# Patient Record
Sex: Male | Born: 1950
Health system: Southern US, Community
[De-identification: ages and names within clinical notes are randomized; demographics above are authoritative.]

## PROBLEM LIST (undated history)

## (undated) ENCOUNTER — Emergency Department: Discharge status: Absent without leave | Payer: Self-pay

## (undated) DIAGNOSIS — I351 Nonrheumatic aortic (valve) insufficiency: Secondary | ICD-10-CM

## (undated) DIAGNOSIS — J45909 Unspecified asthma, uncomplicated: Secondary | ICD-10-CM

## (undated) DIAGNOSIS — C4490 Unspecified malignant neoplasm of skin, unspecified: Secondary | ICD-10-CM

## (undated) DIAGNOSIS — I340 Nonrheumatic mitral (valve) insufficiency: Secondary | ICD-10-CM

## (undated) DIAGNOSIS — H5350 Unspecified color vision deficiencies: Secondary | ICD-10-CM

## (undated) DIAGNOSIS — M7710 Lateral epicondylitis, unspecified elbow: Secondary | ICD-10-CM

## (undated) DIAGNOSIS — H04123 Dry eye syndrome of bilateral lacrimal glands: Secondary | ICD-10-CM

## (undated) DIAGNOSIS — H269 Unspecified cataract: Secondary | ICD-10-CM

## (undated) DIAGNOSIS — M1990 Unspecified osteoarthritis, unspecified site: Secondary | ICD-10-CM

## (undated) DIAGNOSIS — H9319 Tinnitus, unspecified ear: Secondary | ICD-10-CM

## (undated) DIAGNOSIS — M810 Age-related osteoporosis without current pathological fracture: Secondary | ICD-10-CM

## (undated) DIAGNOSIS — K219 Gastro-esophageal reflux disease without esophagitis: Secondary | ICD-10-CM

## (undated) DIAGNOSIS — K649 Unspecified hemorrhoids: Secondary | ICD-10-CM

## (undated) DIAGNOSIS — G901 Familial dysautonomia [Riley-Day]: Secondary | ICD-10-CM

## (undated) DIAGNOSIS — E039 Hypothyroidism, unspecified: Secondary | ICD-10-CM

## (undated) DIAGNOSIS — M797 Fibromyalgia: Secondary | ICD-10-CM

## (undated) DIAGNOSIS — N429 Disorder of prostate, unspecified: Secondary | ICD-10-CM

## (undated) DIAGNOSIS — I10 Essential (primary) hypertension: Secondary | ICD-10-CM

## (undated) DIAGNOSIS — R6889 Other general symptoms and signs: Secondary | ICD-10-CM

## (undated) DIAGNOSIS — K589 Irritable bowel syndrome without diarrhea: Secondary | ICD-10-CM

## (undated) DIAGNOSIS — G4730 Sleep apnea, unspecified: Secondary | ICD-10-CM

## (undated) DIAGNOSIS — E785 Hyperlipidemia, unspecified: Secondary | ICD-10-CM

## (undated) DIAGNOSIS — N200 Calculus of kidney: Secondary | ICD-10-CM

## (undated) HISTORY — DX: Irritable bowel syndrome without diarrhea: K58.9

## (undated) HISTORY — DX: Tinnitus, unspecified ear: H93.19

## (undated) HISTORY — DX: Other general symptoms and signs: R68.89

## (undated) HISTORY — DX: Dry eye syndrome of bilateral lacrimal glands: H04.123

## (undated) HISTORY — DX: Hypothyroidism, unspecified: E03.9

## (undated) HISTORY — DX: Familial dysautonomia (riley-day): G90.1

## (undated) HISTORY — DX: Unspecified osteoarthritis, unspecified site: M19.90

## (undated) HISTORY — PX: TONSILLECTOMY AND ADENOIDECTOMY: SUR1326

## (undated) HISTORY — DX: Unspecified asthma, uncomplicated: J45.909

## (undated) HISTORY — DX: Age-related osteoporosis without current pathological fracture: M81.0

## (undated) HISTORY — DX: Lateral epicondylitis, unspecified elbow: M77.10

## (undated) HISTORY — DX: Unspecified color vision deficiencies: H53.50

## (undated) HISTORY — DX: Unspecified hemorrhoids: K64.9

## (undated) HISTORY — DX: Hyperlipidemia, unspecified: E78.5

## (undated) HISTORY — DX: Essential (primary) hypertension: I10

## (undated) HISTORY — PX: ABLATION OF DYSRHYTHMIC FOCUS: SHX254

## (undated) HISTORY — DX: Nonrheumatic aortic (valve) insufficiency: I35.1

---

## 1978-08-22 HISTORY — PX: ORCHIOPEXY: SUR915

## 2001-08-22 DIAGNOSIS — D496 Neoplasm of unspecified behavior of brain: Secondary | ICD-10-CM

## 2001-08-22 HISTORY — DX: Neoplasm of unspecified behavior of brain: D49.6

## 2002-02-02 ENCOUNTER — Ambulatory Visit
Admit: 2002-02-02 | Disposition: A | Discharge status: Home / Self Care | Payer: Self-pay | Source: Ambulatory Visit | Admitting: Critical Care Medicine

## 2002-08-22 DIAGNOSIS — Z5189 Encounter for other specified aftercare: Secondary | ICD-10-CM

## 2002-08-22 HISTORY — DX: Encounter for other specified aftercare: Z51.89

## 2002-11-16 ENCOUNTER — Ambulatory Visit
Admit: 2002-11-16 | Disposition: A | Discharge status: Home / Self Care | Payer: Self-pay | Source: Ambulatory Visit | Admitting: Family Medicine

## 2002-12-05 HISTORY — PX: OTHER SURGICAL HISTORY: SHX169

## 2003-01-10 ENCOUNTER — Ambulatory Visit
Admission: RE | Admit: 2003-01-10 | Disposition: A | Discharge status: Home / Self Care | Payer: Self-pay | Source: Ambulatory Visit | Admitting: Urology

## 2003-06-13 ENCOUNTER — Ambulatory Visit
Admit: 2003-06-13 | Disposition: A | Discharge status: Home / Self Care | Payer: Self-pay | Source: Ambulatory Visit | Admitting: Orthopaedic Surgery

## 2003-06-18 ENCOUNTER — Ambulatory Visit: Admission: RE | Admit: 2003-06-18 | Payer: Self-pay | Source: Ambulatory Visit | Admitting: Urology

## 2004-02-06 ENCOUNTER — Ambulatory Visit: Admission: RE | Admit: 2004-02-06 | Payer: Self-pay | Source: Ambulatory Visit | Admitting: Urology

## 2004-06-04 ENCOUNTER — Ambulatory Visit
Admit: 2004-06-04 | Disposition: A | Discharge status: Home / Self Care | Payer: Self-pay | Source: Ambulatory Visit | Admitting: Orthopaedic Surgery

## 2004-06-11 ENCOUNTER — Ambulatory Visit
Admission: RE | Admit: 2004-06-11 | Disposition: A | Discharge status: Home / Self Care | Payer: Self-pay | Source: Ambulatory Visit | Admitting: Orthopaedic Surgery

## 2005-03-03 ENCOUNTER — Ambulatory Visit
Admit: 2005-03-03 | Disposition: A | Discharge status: Home / Self Care | Payer: Self-pay | Source: Ambulatory Visit | Admitting: Urology

## 2005-03-11 ENCOUNTER — Ambulatory Visit: Admission: RE | Admit: 2005-03-11 | Payer: Self-pay | Source: Ambulatory Visit | Admitting: Urology

## 2005-04-04 ENCOUNTER — Ambulatory Visit
Admit: 2005-04-04 | Disposition: A | Discharge status: Home / Self Care | Payer: Self-pay | Source: Ambulatory Visit | Admitting: Family Medicine

## 2005-04-16 ENCOUNTER — Ambulatory Visit
Admit: 2005-04-16 | Disposition: A | Discharge status: Home / Self Care | Payer: Self-pay | Source: Ambulatory Visit | Admitting: Family Medicine

## 2005-04-29 HISTORY — PX: ECHOCARDIOGRAM, TRANSTHORACIC: SHX3784

## 2005-06-16 ENCOUNTER — Ambulatory Visit
Admit: 2005-06-16 | Disposition: A | Discharge status: Home / Self Care | Payer: Self-pay | Source: Ambulatory Visit | Admitting: Internal Medicine

## 2005-06-21 ENCOUNTER — Ambulatory Visit
Admission: RE | Admit: 2005-06-21 | Disposition: A | Discharge status: Home / Self Care | Payer: Self-pay | Source: Ambulatory Visit | Admitting: Internal Medicine

## 2005-06-22 ENCOUNTER — Ambulatory Visit
Admit: 2005-06-22 | Disposition: A | Discharge status: Home / Self Care | Payer: Self-pay | Source: Ambulatory Visit | Admitting: Neurology

## 2005-06-26 ENCOUNTER — Emergency Department: Admit: 2005-06-26 | Payer: Self-pay | Source: Emergency Department | Admitting: Emergency Medicine

## 2005-07-01 ENCOUNTER — Ambulatory Visit
Admit: 2005-07-01 | Disposition: A | Discharge status: Home / Self Care | Payer: Self-pay | Source: Ambulatory Visit | Admitting: Internal Medicine

## 2005-07-08 ENCOUNTER — Ambulatory Visit
Admit: 2005-07-08 | Disposition: A | Discharge status: Home / Self Care | Payer: Self-pay | Source: Ambulatory Visit | Admitting: Hospice and Palliative Medicine

## 2006-08-22 DIAGNOSIS — M8700 Idiopathic aseptic necrosis of unspecified bone: Secondary | ICD-10-CM

## 2006-08-22 HISTORY — PX: COLONOSCOPY: SHX174

## 2006-08-22 HISTORY — DX: Idiopathic aseptic necrosis of unspecified bone: M87.00

## 2006-08-22 IMAGING — CT CT NECK W/ CM
1 of 2 series · 9 of 14 positions shown, 12 images · IV contrast (75ML OMNI 300)
Comparison: None.

CLINICAL DATA: Chronic throat pain with left neck discomfort. 
NECK CT WITH CONTRAST:
TECHNIQUE: Multidetector CT imaging of the neck was performed following the standard protocol during administration of intravenous contrast.  A BB was placed over the patient?s palpable concern in the left infrahyoid region.
Contrast:  75 cc Omnipaque 300

[Series 2: neck w/ · axial · 0.39mm/px · z∈[-253,-30]mm · 9 of 113 slices shown, 12 images]
[im 12/113  soft-tissue]
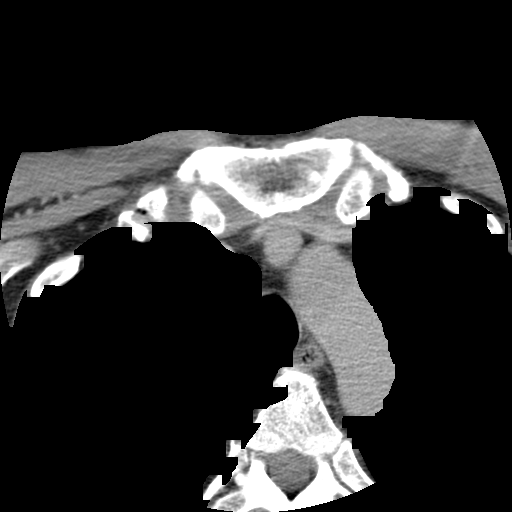
[im 12/113  bone]
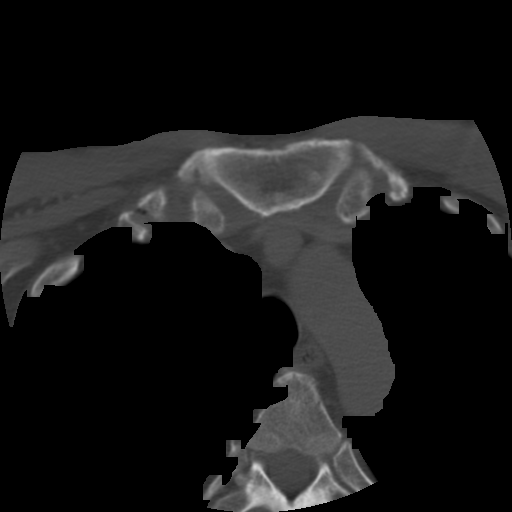
[im 23/113  bone]
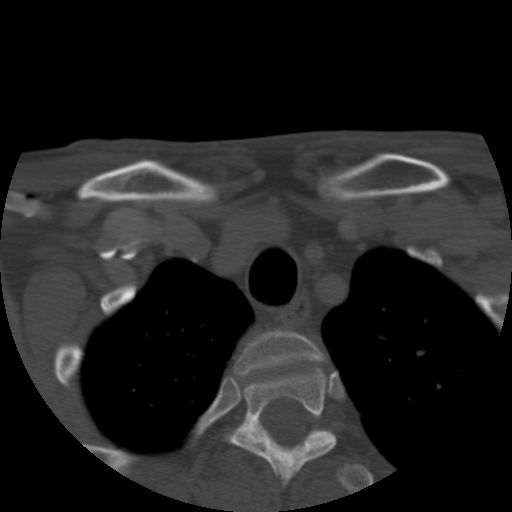
[im 34/113  bone]
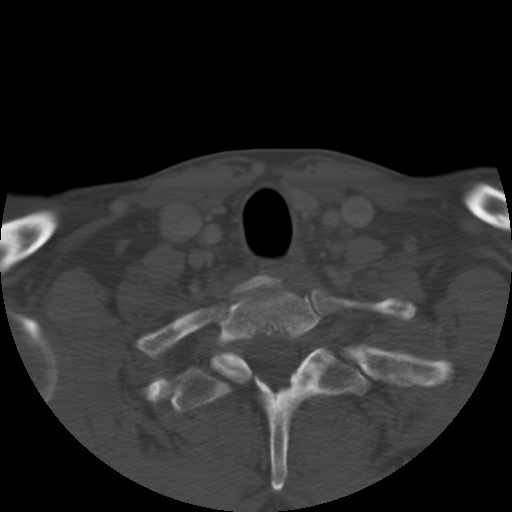
[im 45/113  bone]
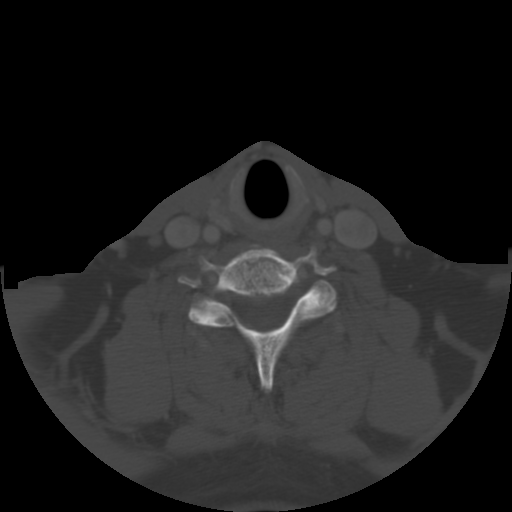
[im 57/113  soft-tissue]
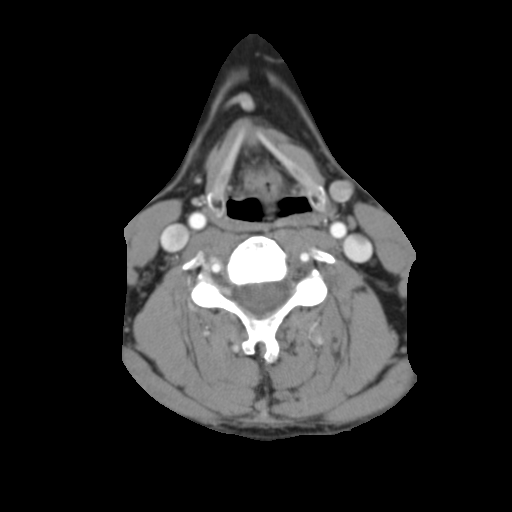
[im 57/113  bone]
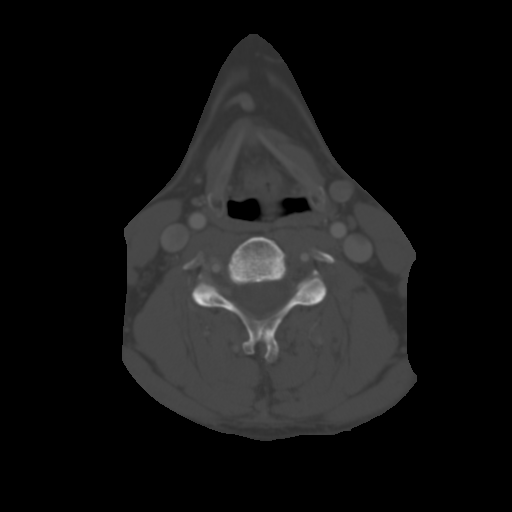
[im 68/113  bone]
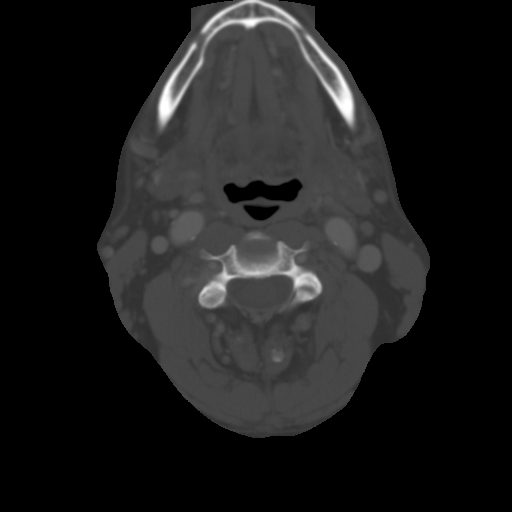
[im 79/113  bone]
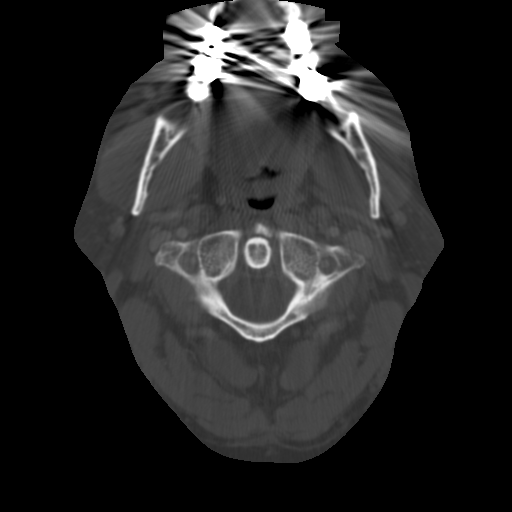
[im 90/113  bone]
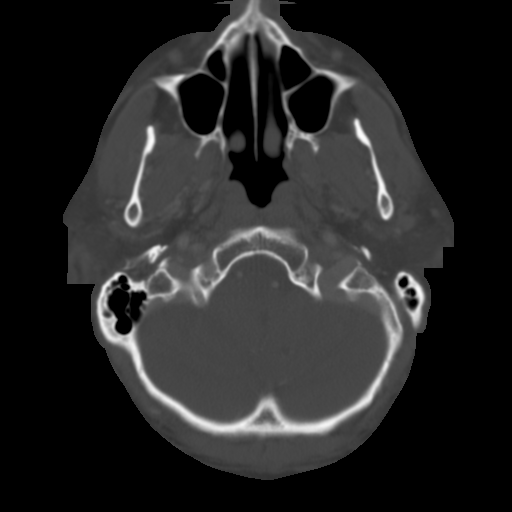
[im 101/113  soft-tissue]
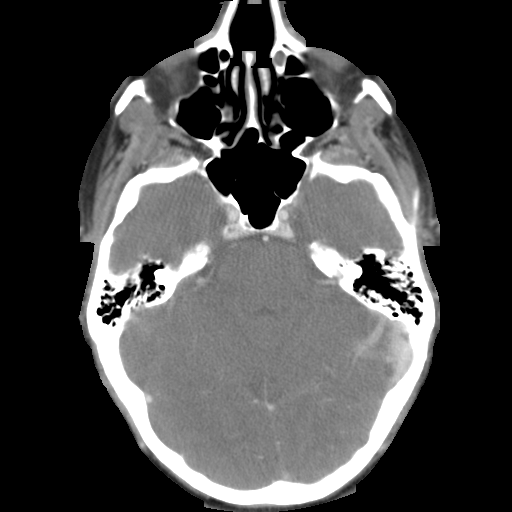
[im 101/113  bone]
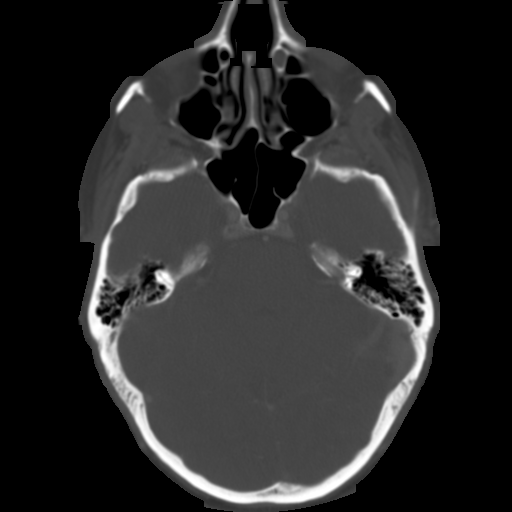

[9 of 14 positions shown; findings below may reference images not displayed]

FINDINGS: No lesions of the pharyngeal mucosal space are identified.  There are no enlarged cervical lymph nodes.  A BB was placed over the patient?s palpable concern in the left infrahyoid region.  No underlying mass is demonstrated in this region. The salivary glands appear unremarkable.  The thyroid gland is noted to be relatively small and slightly nodular in contour without focal abnormality.  The lung apices are clear.  Visualized paranasal sinuses are clear.  Postoperative changes are noted in the right occipital skull with underlying encephalomalacia reportedly due to a history of prior meningioma resection.
IMPRESSION: CT of the neck demonstrates no mass or adenopathy.  Right parietooccipital postoperative changes are partially imaged.

## 2006-11-03 ENCOUNTER — Ambulatory Visit
Admit: 2006-11-03 | Disposition: A | Discharge status: Home / Self Care | Payer: Self-pay | Source: Ambulatory Visit | Admitting: Orthopaedic Surgery

## 2007-01-12 ENCOUNTER — Ambulatory Visit: Admit: 2007-01-12 | Disposition: A | Discharge status: Home / Self Care | Payer: Self-pay | Source: Ambulatory Visit

## 2007-01-12 LAB — GGT: GGT: 125 U/L — ABNORMAL HIGH (ref 15–44)

## 2007-01-12 LAB — URINALYSIS
Bilirubin, UA: NEGATIVE
Blood, UA: NEGATIVE
Glucose, UA: NEGATIVE
Ketones UA: NEGATIVE
Leukocyte Esterase, UA: NEGATIVE
Nitrite, UA: NEGATIVE
Protein, UR: NEGATIVE
Specific Gravity UA POCT: 1.021 (ref 1.001–1.035)
Urine pH: 5 (ref 5.0–8.0)
Urobilinogen, UA: 0.2 EU/dL (ref 0.2–1.0)

## 2007-01-12 LAB — CBC WITH AUTO DIFFERENTIAL CERNER
Basophils Absolute: 0 /mm3 (ref 0.0–0.2)
Basophils: 1 % (ref 0–2)
Eosinophils Absolute: 0.2 /mm3 (ref 0.0–0.7)
Eosinophils: 3 % (ref 0–5)
Granulocytes Absolute: 2.8 /mm3 (ref 1.8–8.1)
Hematocrit: 38 % — ABNORMAL LOW (ref 42.0–52.0)
Hgb: 12.9 G/DL — ABNORMAL LOW (ref 13.0–17.0)
Lymphocytes Absolute: 2.2 /mm3 (ref 0.5–4.4)
Lymphocytes: 39 % (ref 15–41)
MCH: 29.9 PG (ref 28.0–32.0)
MCHC: 33.9 G/DL (ref 32.0–36.0)
MCV: 88.1 FL (ref 80.0–100.0)
MPV: 10 FL (ref 7.4–10.4)
Monocytes Absolute: 0.6 /mm3 (ref 0.0–1.2)
Monocytes: 10 % (ref 0–11)
Neutrophils %: 48 % — ABNORMAL LOW (ref 52–75)
Platelets: 230 /mm3 (ref 140–400)
RBC: 4.32 /mm3 — ABNORMAL LOW (ref 4.70–6.00)
RDW: 13.5 % (ref 11.5–15.0)
WBC: 5.8 /mm3 (ref 3.5–10.8)

## 2007-01-12 LAB — COMPREHENSIVE METABOLIC PANEL
ALT: 52 U/L — ABNORMAL HIGH (ref 3–36)
AST (SGOT): 48 U/L — ABNORMAL HIGH (ref 10–41)
Albumin/Globulin Ratio: 1.3 (ref 1.1–1.8)
Albumin: 4.3 g/dL (ref 3.4–4.9)
Alkaline Phosphatase: 54 U/L (ref 43–112)
BUN: 26 mg/dL — ABNORMAL HIGH (ref 8–20)
Bilirubin, Total: 0.3 mg/dL (ref 0.1–1.0)
CO2: 31 mEq/L — ABNORMAL HIGH (ref 21–30)
Calcium: 9.5 mg/dL (ref 8.6–10.2)
Chloride: 102 mEq/L (ref 98–107)
Creatinine: 1.3 mg/dL (ref 0.6–1.5)
Globulin: 3.4 g/dL (ref 2.0–3.7)
Glucose: 108 mg/dL — ABNORMAL HIGH (ref 70–100)
Potassium: 4.2 mEq/L (ref 3.6–5.0)
Protein, Total: 7.7 g/dL (ref 6.0–8.0)
Sodium: 140 mEq/L (ref 136–146)

## 2007-01-12 LAB — LIPID PANEL
Cholesterol: 192 mg/dL (ref 50–200)
HDL: 55 mg/dL (ref 29–67)
LDL Calculated: 107 mg/dL (ref 0–130)
Triglycerides: 152 mg/dL (ref 40–160)
VLDL Calculated: 30 mg/dL (ref 10–40)

## 2007-01-12 LAB — PHOSPHORUS: Phosphorus: 2.7 mg/dL (ref 2.5–4.5)

## 2007-01-12 LAB — LACTATE DEHYDROGENASE: LDH: 478 U/L (ref 307–575)

## 2007-01-12 LAB — C-REACTIVE PROTEIN HIGH SENSITIVE: C-Reactive Protein, High Sensitive: 0.025 mg/dL (ref ?–0.300)

## 2007-01-12 LAB — TSH: TSH: 0.718 u[IU]/mL (ref 0.465–4.680)

## 2007-01-12 LAB — PSA: Prostate Specific Antigen, Total: 0.1 ng/mL — AB (ref 0.0–4.0)

## 2007-01-12 LAB — GFR

## 2007-01-12 LAB — URIC ACID: Uric acid: 4.3 mg/dL (ref 4.0–8.8)

## 2008-12-03 HISTORY — PX: EXCISION, LESION: SHX3982

## 2009-10-26 ENCOUNTER — Ambulatory Visit
Admit: 2009-10-26 | Disposition: A | Discharge status: Home / Self Care | Payer: Self-pay | Source: Ambulatory Visit | Admitting: Internal Medicine

## 2011-01-05 ENCOUNTER — Other Ambulatory Visit: Payer: Self-pay | Admitting: Internal Medicine

## 2011-01-05 ENCOUNTER — Other Ambulatory Visit: Payer: Self-pay

## 2011-01-05 DIAGNOSIS — M797 Fibromyalgia: Secondary | ICD-10-CM

## 2011-01-05 DIAGNOSIS — R5382 Chronic fatigue, unspecified: Secondary | ICD-10-CM

## 2011-01-05 MED ORDER — CLONAZEPAM 0.5 MG PO TABS
0.50 mg | ORAL_TABLET | Freq: Three times a day (TID) | ORAL | Status: DC | PRN
Start: 2011-01-05 — End: 2012-01-30

## 2011-01-05 MED ORDER — MODAFINIL 100 MG PO TABS
100.00 mg | ORAL_TABLET | Freq: Three times a day (TID) | ORAL | Status: DC
Start: 2011-01-05 — End: 2011-07-19

## 2011-01-18 ENCOUNTER — Other Ambulatory Visit: Payer: Self-pay | Admitting: Internal Medicine

## 2011-01-18 DIAGNOSIS — M797 Fibromyalgia: Secondary | ICD-10-CM

## 2011-01-18 MED ORDER — PREGABALIN 100 MG PO CAPS
100.00 mg | ORAL_CAPSULE | Freq: Three times a day (TID) | ORAL | Status: DC
Start: 2011-01-18 — End: 2011-08-17

## 2011-03-02 ENCOUNTER — Other Ambulatory Visit: Payer: Self-pay | Admitting: Internal Medicine

## 2011-03-02 DIAGNOSIS — M797 Fibromyalgia: Secondary | ICD-10-CM

## 2011-03-02 MED ORDER — OXYCODONE-ACETAMINOPHEN 5-325 MG PO TABS
1.00 | ORAL_TABLET | Freq: Two times a day (BID) | ORAL | Status: DC
Start: 2011-03-02 — End: 2011-06-14

## 2011-03-02 NOTE — Progress Notes (Signed)
Update medications-altace is 20mg  daily

## 2011-04-08 NOTE — Progress Notes (Signed)
Med list updated

## 2011-04-27 ENCOUNTER — Other Ambulatory Visit: Payer: Self-pay

## 2011-04-27 DIAGNOSIS — E291 Testicular hypofunction: Secondary | ICD-10-CM

## 2011-04-27 DIAGNOSIS — M797 Fibromyalgia: Secondary | ICD-10-CM

## 2011-04-27 DIAGNOSIS — F32A Depression, unspecified: Secondary | ICD-10-CM

## 2011-04-27 MED ORDER — DULOXETINE HCL 60 MG PO CPEP
60.00 mg | ORAL_CAPSULE | Freq: Every day | ORAL | Status: AC
Start: 2011-04-27 — End: 2012-04-26

## 2011-04-27 MED ORDER — TESTOSTERONE 1.25 GM/ACT (1%) TD GEL
5.00 g | Freq: Every day | TRANSDERMAL | Status: DC
Start: 2011-04-27 — End: 2011-07-19

## 2011-04-27 NOTE — Telephone Encounter (Signed)
Patient requested refills on these meds per message below:    Could you please have [name] send scripts for:    Cymbalta 60 mg/day # 90 with three refills     Androgel cream 5 gm/day # 90 with one refill (only one allowed).

## 2011-05-07 LAB — ECG 12-LEAD
Atrial Rate: 69 {beats}/min
P Axis: 45 degrees
P-R Interval: 166 ms
Q-T Interval: 412 ms
QRS Duration: 116 ms
QTC Calculation (Bezet): 441 ms
R Axis: -10 degrees
T Axis: 30 degrees
Ventricular Rate: 69 {beats}/min

## 2011-06-14 ENCOUNTER — Other Ambulatory Visit: Payer: Self-pay | Admitting: Internal Medicine

## 2011-06-14 DIAGNOSIS — M797 Fibromyalgia: Secondary | ICD-10-CM

## 2011-06-14 MED ORDER — OXYCODONE-ACETAMINOPHEN 5-325 MG PO TABS
1.00 | ORAL_TABLET | Freq: Two times a day (BID) | ORAL | Status: DC
Start: 2011-06-23 — End: 2011-06-14

## 2011-06-14 MED ORDER — OXYCODONE-ACETAMINOPHEN 5-325 MG PO TABS
1.00 | ORAL_TABLET | Freq: Two times a day (BID) | ORAL | Status: DC
Start: 2011-07-23 — End: 2011-06-14

## 2011-06-14 MED ORDER — OXYCODONE-ACETAMINOPHEN 5-325 MG PO TABS
1.00 | ORAL_TABLET | Freq: Two times a day (BID) | ORAL | Status: AC
Start: 2011-06-14 — End: 2011-07-13

## 2011-07-18 ENCOUNTER — Other Ambulatory Visit: Payer: Self-pay | Admitting: Internal Medicine

## 2011-07-19 ENCOUNTER — Other Ambulatory Visit: Payer: Self-pay | Admitting: Internal Medicine

## 2011-07-19 DIAGNOSIS — E291 Testicular hypofunction: Secondary | ICD-10-CM

## 2011-07-19 DIAGNOSIS — J310 Chronic rhinitis: Secondary | ICD-10-CM

## 2011-07-19 DIAGNOSIS — R5383 Other fatigue: Secondary | ICD-10-CM

## 2011-07-19 DIAGNOSIS — E781 Pure hyperglyceridemia: Secondary | ICD-10-CM

## 2011-07-19 DIAGNOSIS — I10 Essential (primary) hypertension: Secondary | ICD-10-CM

## 2011-07-19 DIAGNOSIS — M797 Fibromyalgia: Secondary | ICD-10-CM

## 2011-07-19 DIAGNOSIS — E785 Hyperlipidemia, unspecified: Secondary | ICD-10-CM

## 2011-07-19 DIAGNOSIS — J45998 Other asthma: Secondary | ICD-10-CM

## 2011-07-19 DIAGNOSIS — N400 Enlarged prostate without lower urinary tract symptoms: Secondary | ICD-10-CM

## 2011-07-19 DIAGNOSIS — E039 Hypothyroidism, unspecified: Secondary | ICD-10-CM

## 2011-07-19 DIAGNOSIS — F32A Depression, unspecified: Secondary | ICD-10-CM

## 2011-07-19 MED ORDER — LEVOTHYROXINE SODIUM 200 MCG PO TABS
200.00 ug | ORAL_TABLET | Freq: Every day | ORAL | Status: DC
Start: 2011-07-19 — End: 2012-05-14

## 2011-07-19 MED ORDER — ALBUTEROL 90 MCG/ACT IN AERS
2.0000 | INHALATION_SPRAY | Freq: Four times a day (QID) | RESPIRATORY_TRACT | Status: AC | PRN
Start: 2011-07-19 — End: 2012-07-13

## 2011-07-19 MED ORDER — EZETIMIBE 10 MG PO TABS
10.00 mg | ORAL_TABLET | Freq: Every day | ORAL | Status: DC
Start: 2011-07-19 — End: 2012-05-14

## 2011-07-19 MED ORDER — DUTASTERIDE 0.5 MG PO CAPS
0.50 mg | ORAL_CAPSULE | Freq: Every day | ORAL | Status: DC
Start: 2011-07-19 — End: 2012-05-14

## 2011-07-19 MED ORDER — AMLODIPINE BESYLATE 5 MG PO TABS
5.00 mg | ORAL_TABLET | Freq: Every day | ORAL | Status: DC
Start: 2011-07-19 — End: 2012-05-14

## 2011-07-19 MED ORDER — MODAFINIL 100 MG PO TABS
100.00 mg | ORAL_TABLET | Freq: Three times a day (TID) | ORAL | Status: DC
Start: 2011-07-19 — End: 2012-01-30

## 2011-07-19 MED ORDER — TRIAMCINOLONE ACETONIDE 55 MCG/ACT NA INHA
2.00 | Freq: Two times a day (BID) | NASAL | Status: DC
Start: 2011-07-19 — End: 2012-05-14

## 2011-07-19 MED ORDER — TESTOSTERONE 1.25 GM/ACT (1%) TD GEL
5.00 g | Freq: Every day | TRANSDERMAL | Status: DC
Start: 2011-07-19 — End: 2012-01-30

## 2011-07-19 MED ORDER — RAMIPRIL 10 MG PO TABS
20.00 mg | ORAL_TABLET | Freq: Every day | ORAL | Status: DC
Start: 2011-07-19 — End: 2014-02-13

## 2011-07-19 MED ORDER — MONTELUKAST SODIUM 10 MG PO TABS
10.00 mg | ORAL_TABLET | Freq: Every evening | ORAL | Status: DC
Start: 2011-07-19 — End: 2012-05-14

## 2011-07-19 MED ORDER — FENOFIBRATE 145 MG PO TABS
145.00 mg | ORAL_TABLET | Freq: Every day | ORAL | Status: DC
Start: 2011-07-19 — End: 2012-05-14

## 2011-07-19 NOTE — Telephone Encounter (Signed)
Meds refilled for one year. Patient aware of need to recheck testosterone level.

## 2011-08-08 ENCOUNTER — Encounter: Payer: Self-pay | Admitting: Internal Medicine

## 2011-08-17 ENCOUNTER — Other Ambulatory Visit: Payer: Self-pay | Admitting: Internal Medicine

## 2011-08-17 DIAGNOSIS — M797 Fibromyalgia: Secondary | ICD-10-CM

## 2011-08-17 MED ORDER — PREGABALIN 100 MG PO CAPS
100.00 mg | ORAL_CAPSULE | Freq: Three times a day (TID) | ORAL | Status: DC
Start: 2011-08-17 — End: 2012-03-22

## 2011-08-31 ENCOUNTER — Other Ambulatory Visit: Payer: Self-pay | Admitting: Internal Medicine

## 2011-08-31 DIAGNOSIS — M797 Fibromyalgia: Secondary | ICD-10-CM

## 2011-08-31 MED ORDER — OXYCODONE-ACETAMINOPHEN 5-325 MG PO TABS
1.00 | ORAL_TABLET | Freq: Two times a day (BID) | ORAL | Status: DC
Start: 2011-10-01 — End: 2011-10-25

## 2011-08-31 MED ORDER — OXYCODONE-ACETAMINOPHEN 5-325 MG PO TABS
1.00 | ORAL_TABLET | Freq: Two times a day (BID) | ORAL | Status: DC
Start: 2011-11-01 — End: 2011-10-25

## 2011-08-31 MED ORDER — OXYCODONE-ACETAMINOPHEN 5-325 MG PO TABS
1.00 | ORAL_TABLET | Freq: Two times a day (BID) | ORAL | Status: DC
Start: 2011-08-31 — End: 2011-08-31

## 2011-10-25 ENCOUNTER — Other Ambulatory Visit: Payer: Self-pay | Admitting: Internal Medicine

## 2011-10-25 DIAGNOSIS — M797 Fibromyalgia: Secondary | ICD-10-CM

## 2011-10-25 MED ORDER — OXYCODONE-ACETAMINOPHEN 5-325 MG PO TABS
1.00 | ORAL_TABLET | Freq: Two times a day (BID) | ORAL | Status: DC
Start: 2012-01-01 — End: 2011-10-25

## 2011-10-25 MED ORDER — OXYCODONE-ACETAMINOPHEN 5-325 MG PO TABS
1.00 | ORAL_TABLET | Freq: Two times a day (BID) | ORAL | Status: DC
Start: 2011-12-02 — End: 2011-10-25

## 2011-10-25 MED ORDER — OXYCODONE-ACETAMINOPHEN 5-325 MG PO TABS
1.00 | ORAL_TABLET | Freq: Two times a day (BID) | ORAL | Status: DC
Start: 2012-02-01 — End: 2012-01-30

## 2011-10-25 MED ORDER — OXYCODONE-ACETAMINOPHEN 5-325 MG PO TABS
1.00 | ORAL_TABLET | Freq: Two times a day (BID) | ORAL | Status: AC
Start: 2011-10-25 — End: 2011-11-24

## 2011-10-27 ENCOUNTER — Other Ambulatory Visit: Payer: Self-pay | Admitting: Internal Medicine

## 2011-10-27 DIAGNOSIS — M797 Fibromyalgia: Secondary | ICD-10-CM

## 2011-10-27 MED ORDER — CLONAZEPAM 0.5 MG PO TABS
ORAL_TABLET | ORAL | Status: DC
Start: 2011-10-27 — End: 2012-01-30

## 2012-01-30 ENCOUNTER — Other Ambulatory Visit: Payer: Self-pay | Admitting: Internal Medicine

## 2012-01-30 ENCOUNTER — Telehealth: Payer: Self-pay

## 2012-01-30 DIAGNOSIS — R5383 Other fatigue: Secondary | ICD-10-CM

## 2012-01-30 DIAGNOSIS — M797 Fibromyalgia: Secondary | ICD-10-CM

## 2012-01-30 DIAGNOSIS — E291 Testicular hypofunction: Secondary | ICD-10-CM

## 2012-01-30 MED ORDER — OXYCODONE-ACETAMINOPHEN 5-325 MG PO TABS
1.00 | ORAL_TABLET | Freq: Two times a day (BID) | ORAL | Status: DC
Start: 2012-04-03 — End: 2012-01-30

## 2012-01-30 MED ORDER — TESTOSTERONE 50 MG/5GM (1%) TD GEL
50.00 mg | Freq: Every day | TRANSDERMAL | Status: DC
Start: 2012-01-30 — End: 2013-08-20

## 2012-01-30 MED ORDER — MODAFINIL 100 MG PO TABS
100.00 mg | ORAL_TABLET | Freq: Three times a day (TID) | ORAL | Status: DC
Start: 2012-01-30 — End: 2012-08-06

## 2012-01-30 MED ORDER — OXYCODONE-ACETAMINOPHEN 5-325 MG PO TABS
1.00 | ORAL_TABLET | Freq: Two times a day (BID) | ORAL | Status: DC
Start: 2012-03-03 — End: 2012-01-30

## 2012-01-30 MED ORDER — OXYCODONE-ACETAMINOPHEN 5-325 MG PO TABS
1.00 | ORAL_TABLET | Freq: Two times a day (BID) | ORAL | Status: DC
Start: 2012-05-04 — End: 2012-05-31

## 2012-01-30 MED ORDER — TESTOSTERONE 50 MG/5GM (1%) TD GEL
50.00 mg | Freq: Every day | TRANSDERMAL | Status: DC
Start: 2012-01-30 — End: 2012-01-30

## 2012-01-30 MED ORDER — CLONAZEPAM 0.5 MG PO TABS
0.50 mg | ORAL_TABLET | Freq: Three times a day (TID) | ORAL | Status: DC | PRN
Start: 2012-01-30 — End: 2012-05-31

## 2012-01-30 NOTE — Telephone Encounter (Signed)
Just completed all them and we will put in the mail to you.  [name]    From: [name] [[email or web address]]   Sent: Tuesday, January 24, 2012 10:50 AM  To: [name].  Subject: scripts    Hi [name],  I am filling my last my last Percocet script.  I need new Percocet scripts: # 60 for each month.  You usually send 3 months at a time.   In addition, I need Clonazepan 0.5 mg # 90, one tab Q 8h.  It was last filled 06/20/11.  I also need Provigil 100 mg tabs, three po qd, # 3 months with one refill (it can only be refilled once)  Also need Androgel cream 5 gm qd with one refill (this also can only be refilled once).  I am not using the whole packet.  Your last script was Androgel pump.  I am using 2 pump depressions which is 2.5 gm daily since my last testosterone level was too high.  Have not redone blood levels.    I am going to get blood work and make my annual appointment.      Hope all is going well with you and your family.  Hope Verne Carrow is treating you fairly,    Kavaughn

## 2012-01-30 NOTE — Progress Notes (Signed)
Patients meds refilled-stable chronic meds. Patient scheduled for labs and follow up in next 3 months. Patient aware.

## 2012-03-22 ENCOUNTER — Other Ambulatory Visit: Payer: Self-pay | Admitting: Family Medicine

## 2012-03-22 DIAGNOSIS — M797 Fibromyalgia: Secondary | ICD-10-CM

## 2012-03-22 DIAGNOSIS — R5383 Other fatigue: Secondary | ICD-10-CM

## 2012-03-22 MED ORDER — PREGABALIN 100 MG PO CAPS
100.00 mg | ORAL_CAPSULE | Freq: Three times a day (TID) | ORAL | Status: DC
Start: 2012-03-22 — End: 2013-01-22

## 2012-03-22 MED ORDER — PREGABALIN 100 MG PO CAPS
100.00 mg | ORAL_CAPSULE | Freq: Three times a day (TID) | ORAL | Status: DC
Start: 2012-03-22 — End: 2012-03-22

## 2012-04-23 ENCOUNTER — Other Ambulatory Visit: Payer: Self-pay | Admitting: Internal Medicine

## 2012-04-24 ENCOUNTER — Telehealth: Payer: Self-pay | Admitting: Internal Medicine

## 2012-04-24 NOTE — Telephone Encounter (Signed)
Spoke with patient for 45 minutes regarding his complete medical history including meds and past medical history. Patient reports worsening of his post infectious (EBV) fibromyalgia muscle pains and chronic fatigue. Patient initially reported a 9 pound weight loss, but had not weighed himself in some time and so may have been gradual over several months. Patient has since regained 2 to 3 pounds. No new sxs of other local pains or sweats. No respiratory, cardiac, GI, or GU sxs. No new neurologic sxs. Of note patient had weaned off his clonazepam. At the time of this note patient has emailed to say restarting the clonazepam has made a world of improvement.     Longer term evaluation and treatment plans for his fibromyalgia and chronic fatigue were also discussed to include:  1. Resuming and increasing clonazepam if needed.  2. Trial of a fentanyl or lidocaine pain patch instead of periodic percocet-patient appears to have some pain relief over not using or exercise alone.  3. Referral to Dr. Hollie Salk for accupuncture and alternative therapy discussion. Also possible integrative labs discussion.  4. Suggested trial of gluten free diet to see if decreases patients "inflammatation" complaints-patient aware we have no scientific back up for this, but wants to try and see.  5. EMG/NC to see if there has been any changes to suggest other neurologic cause besides post infectious fibromyalgia and CFS.   6. Continued exercise.  7. 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 Highly recommended patient review his disability policy as illness impacting patients quality of life. Of note there is no evidence that patients current meds are impacting patient safety in any way. Patient is awake and communicative and of extreme clear thought.   8. Patient will call me next week to let me know if the clonazepam being resumed fixes his current issues.  9. Note patient's labs reviewed and no cause  for above complaints identified.   10. Also reviewed age appropriate malignancy screening and that patient is due for repeat colonoscopy. He stated he will schedule with GI.

## 2012-04-26 ENCOUNTER — Ambulatory Visit: Payer: Self-pay | Admitting: Internal Medicine

## 2012-05-14 ENCOUNTER — Other Ambulatory Visit: Payer: Self-pay | Admitting: Internal Medicine

## 2012-05-15 ENCOUNTER — Other Ambulatory Visit: Payer: Self-pay

## 2012-05-15 MED ORDER — DULOXETINE HCL 60 MG PO CPEP
60.00 mg | ORAL_CAPSULE | Freq: Every day | ORAL | Status: DC
Start: 2012-05-15 — End: 2013-03-23

## 2012-05-31 ENCOUNTER — Other Ambulatory Visit: Payer: Self-pay | Admitting: Internal Medicine

## 2012-05-31 DIAGNOSIS — M797 Fibromyalgia: Secondary | ICD-10-CM

## 2012-05-31 MED ORDER — OXYCODONE-ACETAMINOPHEN 5-325 MG PO TABS
1.00 | ORAL_TABLET | Freq: Two times a day (BID) | ORAL | Status: DC
Start: 2012-06-04 — End: 2012-05-31

## 2012-05-31 MED ORDER — CLONAZEPAM 0.5 MG PO TABS
0.50 mg | ORAL_TABLET | Freq: Three times a day (TID) | ORAL | Status: DC | PRN
Start: 2012-05-31 — End: 2012-05-31

## 2012-05-31 MED ORDER — CLONAZEPAM 0.5 MG PO TABS
0.50 mg | ORAL_TABLET | Freq: Three times a day (TID) | ORAL | Status: DC | PRN
Start: 2012-05-31 — End: 2012-12-05

## 2012-05-31 MED ORDER — OXYCODONE-ACETAMINOPHEN 5-325 MG PO TABS
1.00 | ORAL_TABLET | Freq: Two times a day (BID) | ORAL | Status: DC
Start: 2012-07-05 — End: 2012-05-31

## 2012-05-31 MED ORDER — OXYCODONE-ACETAMINOPHEN 5-325 MG PO TABS
1.00 | ORAL_TABLET | Freq: Two times a day (BID) | ORAL | Status: DC
Start: 2012-08-05 — End: 2012-08-06

## 2012-08-06 ENCOUNTER — Other Ambulatory Visit: Payer: Self-pay | Admitting: Internal Medicine

## 2012-08-06 DIAGNOSIS — R5383 Other fatigue: Secondary | ICD-10-CM

## 2012-08-06 DIAGNOSIS — M797 Fibromyalgia: Secondary | ICD-10-CM

## 2012-08-06 DIAGNOSIS — E785 Hyperlipidemia, unspecified: Secondary | ICD-10-CM

## 2012-08-06 MED ORDER — MODAFINIL 100 MG PO TABS
100.00 mg | ORAL_TABLET | Freq: Three times a day (TID) | ORAL | Status: DC
Start: 2012-08-06 — End: 2012-08-06

## 2012-08-06 MED ORDER — MODAFINIL 100 MG PO TABS
100.00 mg | ORAL_TABLET | Freq: Three times a day (TID) | ORAL | Status: DC
Start: 2012-08-06 — End: 2012-08-24

## 2012-08-06 MED ORDER — EZETIMIBE 10 MG PO TABS
10.00 mg | ORAL_TABLET | Freq: Every day | ORAL | Status: DC
Start: 2012-08-06 — End: 2013-11-28

## 2012-08-06 MED ORDER — OXYCODONE-ACETAMINOPHEN 5-325 MG PO TABS
1.00 | ORAL_TABLET | Freq: Two times a day (BID) | ORAL | Status: AC
Start: 2011-11-04 — End: 2011-12-04

## 2012-08-06 MED ORDER — OXYCODONE-ACETAMINOPHEN 5-325 MG PO TABS
1.00 | ORAL_TABLET | Freq: Two times a day (BID) | ORAL | Status: DC
Start: 2012-08-06 — End: 2012-08-06

## 2012-08-06 MED ORDER — OXYCODONE-ACETAMINOPHEN 5-325 MG PO TABS
1.00 | ORAL_TABLET | Freq: Two times a day (BID) | ORAL | Status: DC
Start: 2012-09-07 — End: 2012-08-06

## 2012-08-14 NOTE — Op Note (Unsigned)
DATE OF BIRTH:                        May 19, 1951      ADMISSION DATE:                     01/10/2003            PATIENT LOCATION:                     [id number]            DATE OF PROCEDURE:                   01/10/2003      SURGEON:                            Denton Meek, MD      ASSISTANT(S):                  PREOPERATIVE DIAGNOSES      1.   FREQUENCY AND NOCTURIA.      2.   OBSTRUCTIVE PROSTATISM.            POSTOPERATIVE DIAGNOSES      1.   PAPILLARY LESION, RIGHT LATERAL WALL.      2.   MINIMAL OCCLUSIVE PROSTATIC HYPERPLASIA.      3.   GLOMERULIZATIONS COMPATIBLE WITH INTERSTITIAL CYSTITIS.            PROCEDURES      1.   CYSTOURETHROSCOPY.      2.   HYDRODISTENTION OF THE BLADDER.      3.   BLADDER WASHINGS FOR CYTOLOGY.      4.   TRANSURETHRAL RESECTION OF BLADDER TUMOR, SMALL.            ANESTHESIA:  LMAC followed by general ET.            INDICATIONS:  Shannon [name] is a 61 year old gentleman with severe      obstructive symptoms with decreased force of stream, frequency and nocturia      followed by a bout of acute urinary retention post resection of a      meningioma at [facility] in [place]. He is on Flomax and Avodart and      Klonopin. Because of his persistent frequency and failure of medication, he      is now for cystoscopy under sedation to assess the status of the bladder      outlet and the bladder in general.            DESCRIPTION OF PROCEDURE:  The patient was seen in the staging area and      subsequently given 500 mg of Levaquin intravenously, and taken to the      cystoscopy suite. After the adequate induction of intravenous sedation, he      was placed in the dorsal lithotomy position, prepped and draped in a      sterile fashion for a urologic procedure. Cystourethroscopy was initiated      with a 22-French scope with a Foroblique lens. The anterior urethra was      normal. There were no restrictive strictures whatsoever. There were some      wide-caliber areas within the bulbar  urethra which were insignificant. The      prostatic channel was open with minimal coaptation of the lateral  lobes for      a distance total of about 3 cm.  The bladder was entered without any      difficulty. There were some mild inflammatory changes on the prostate as      well. The bladder, then, was thoroughly inspected. The UOs were normal      size, shape and configuration and on further distention of the bladder and      with the let-down of fluid, there were multiple petechiae seen on the      posterior wall and lateral walls as well. Further surveillance with a      70-degree lens found a small papillary lesion on the right lateral wall as      well. This had the appearance of perhaps inflammatory but more papillary      configuration as well. Bladder washings for cystology were done with normal      saline in barbotage fashion and further dilatation and decompression of the      bladder were performed with the appearance then of more glomerulizations      and petechiae. The size of the tumor was small and I felt best to resect      this with resectoscope. The scope was removed and a 27-French resectoscope      over a Timberlake obturator was then positioned without difficulty.      Resection began, removing the tissue nicely from the area. The area was      then fulgurated with the electrode after this. Hemostasis was excellent.      Small fragments of tissue were collected and sent to pathology for      evaluation. This essentially terminated the procedure. Hemostasis was well      controlled. the scope was removed and a 20-French Foley catheter placed,      the balloon was inflated. Stents were secured to the Foley catheter placed,      attached to a drainage bag and leg strap. He was extubated, transferred to      the gurney and taken to the recovery room in stable condition.                                    ___________________________________     Date Signed: __________      Denton Meek, MD   ([id number])            D: 01/10/2003 by Denton Meek, MD      T: 01/11/2003 by [id number] ([id number]) ([id number])      cc:  Denton Meek, MD

## 2012-08-14 NOTE — Op Note (Unsigned)
DATE OF BIRTH:                        02/15/1951      ADMISSION DATE:                     06/18/2003            PATIENT LOCATION:                     [id number]            DATE OF PROCEDURE:                   06/18/2003      SURGEON:                            Denton Meek, MD      ASSISTANT(S):                  PREOPERATIVE DIAGNOSIS:  NOCTURIA AND FREQUENCY.            POSTOPERATIVE DIAGNOSES      1.   MILDLY OCCLUSIVE PROSTATE.      2.   INFLAMMATORY CHANGES OF THE BLADDER AND PROSTATIC CHANNEL.      3.   SMALL PETECHIAE WITH BLADDER DISTENTION.'CLOCK.      4.   MILD OCCLUSIVE PROSTATISM.            PROCEDURES      1.   CYSTOURETHROSCOPY.      2.   BLADDER WASHINGS FOR CYTOLOGY.            ANESTHESIA:  MAC.            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      A cystoscopy done in May of this year revealed inflammatory changes.  He is      now for a followup cystoscopy for this.            DESCRIPTION OF PROCEDURE:  The patient was seen in the staging area and      subsequently taken to the cystoscopy suite.  After the adequate induction      of intravenous sedation, he was placed in the dorsal lithotomy position and      prepped and draped in sterile fashion for urologic procedure.      Cystourethroscopy was initiated with a 22-French Stortz scope.  The      anterior urethra was normal with a wide caliber stricture in the bulb which      was easily traversed.  The prostatic channel was just mildly occluded,      somewhat less than the prior cystoscopy in May.  The bladder was entered      without difficulty and thoroughly inspected with the 30- and 70-degree      lenses.  The UOs were normal size, shape and configuration.  There was a      hyperemic state within the bladder.  The old area of biopsy on the right      lateral wall was seen.  There was no visual evidence of any recurrence of       inflammatory lesions.  After the entire bladder was inspected, bladder      washings  for cytology were done with normal saline in a barbotage fashion.      Again, the bladder was resurveyed.  No lesions were found.  Of note, after      multiple distentions of the bladder like last time, he had multiple      petechiae on the urothelial lining.  This terminated the procedure.  The      scope was removed.      The patient had 2% Xylocaine injected per urethra and a 16-French Foley      catheter was placed since he had had problems with urinary retention in the      past post manipulation.  A leg bag was attached.  He was awakened,      transferred to the gurney and taken to the recovery room in stable      condition.                                    ___________________________________          Date Signed: __________      Denton Meek, MD  ([id number])            D: 06/18/2003 by Denton Meek, MD      T: 06/19/2003 by [id number] ([id number]) ([id number])      cc:  Denton Meek, MD

## 2012-08-24 ENCOUNTER — Other Ambulatory Visit: Payer: Self-pay | Admitting: Family Medicine

## 2012-08-24 DIAGNOSIS — R5383 Other fatigue: Secondary | ICD-10-CM

## 2012-08-24 MED ORDER — MODAFINIL 100 MG PO TABS
100.00 mg | ORAL_TABLET | Freq: Three times a day (TID) | ORAL | Status: DC
Start: 2012-08-24 — End: 2012-12-03

## 2012-08-24 NOTE — Op Note (Unsigned)
DATE OF BIRTH:                        Jan 03, 1951      ADMISSION DATE:                     03/11/2005            PATIENT LOCATION:                     [id number]            DATE OF PROCEDURE:      SURGEON:                            Baldwin Crown, MD      ASSISTANT(S):                         Gabriel Earing, MD                  PREOPERATIVE DIAGNOSIS:  FERTILITY.            POSTOPERATIVE DIAGNOSIS:  FERTILITY.            ANESTHESIA:  General endotracheal anesthesia.            PROCEDURE:  Bilateral vasectomy.            ESTIMATED BLOOD LOSS:  Minimal.            URINE OUTPUT:  Not recorded.            COMPLICATIONS:  None.            DRAINS:  None.            INDICATIONS:  The patient is a 62 year old male who is seeking permanent      contraception.  After a discussion of the risks and benefits, including the      irreversibility of vasectomy, the patient has elected to undergo elective      vasectomy.            DESCRIPTION OF PROCEDURE:  Consent was obtained and all questions were      answered.  The patient was taken back to the operating room and placed in a      supine position, where general endotracheal anesthesia was induced.  The      patient was prepped and draped in a standard sterile fashion.  The area of      the left scrotum was palpated, and the spermatic cord pushed away from the      vas deferens.  A small skin incision was made using the needle tipped      Bovie.  This was done in the area overlying the palpated vas deferens, and      this area was bluntly dissected using a sharp hemostat, and the vas was      brought up onto the operative field with the adjacent vessels bluntly      dissected free, as well as using electrocautery.  The vas was lifted off      the spermatic cord, and 2 hemostats were then placed approximately 2 cm      apart.  The vas deferens was then excised sharply using scissors, and sent      to pathology as a specimen.  The lumen of the vas on both the proximal and  distal  end was then cauterized using the Bovie.  A 3-0 chromic suture      ligature was then used to tie off both ends of the vas deferens.  Both ends      of the vas were then returned back into the scrotum after the area was      checked for any hemostasis, and there was no evidence of any bleeding seen.      The scrotal skin was then reapproximated using 2 interrupted stitches of      3-0 chromic.  Attention was then directed towards the right scrotum.  Once      again the vas deferens was palpated, an incision was made using      electrocautery, and the vas deferens was bluntly dissected away from the      spermatic cord.  The vessels were brushed away from the vas.  Using the vas      clamp, the vas was clamped onto it using hemostats, and again, excised      sharply and cauterized.  The area was inspected for hemostasis after the      3-0 chromic ligature was used to tie off both ends of the vas.  The vas was      returned back into its normal position, and the skin was reapproximated      using two 3-0 chromic interrupted sutures.  The incisions were then covered      using antibiotic ointment, and finally, scrubs and a scrotal support were      placed.  At the end of the case, all counts were correct and the patient      tolerated the procedure well.  The patient was extubated and returned to      the recovery room in stable condition.  Dr. Mayra Neer was present during the      entire case.                                                ___________________________________          Date Signed: __________      Baldwin Crown, MD  ([id number])            D: 04/01/2005 by Gabriel Earing, MD      T: 04/01/2005 by [id number] ([id number]) ([id number])      cc:  Baldwin Crown, MD

## 2012-08-24 NOTE — Op Note (Signed)
DATE OF BIRTH:                        Nov 04, 1950      ADMISSION DATE:                     03/11/2005            PATIENT LOCATION:                     [id number]            DATE OF PROCEDURE:                   03/11/2005      SURGEON:                            Denton Meek, MD      ASSISTANT(S):                  PREOPERATIVE DIAGNOSIS:  NOCTURIA AND FREQUENCY.            POSTOPERATIVE DIAGNOSES      1.   WIDE CALIBER URETHRAL STRICTURE.      2.   BLADDER OUTLET OBSTRUCTION SECONDARY TO BENIGN PROSTATIC ENLARGEMENT.      3.   GLOMERULARIZATION COMPATIBLE WITH INTERSTITIAL CYSTITIS.            PROCEDURES      1.   CYSTOURETHROSCOPY.      2.   BLADDER DISTENTION.            ANESTHESIA:  MAC.            INDICATIONS:  This patient is a 62 year old gentleman with severe nocturia      and frequency, helped somewhat with Avodart and Flomax.  On prior      cystoscopies there was a small lesion on the right lateral wall, biopsy of      which was unconclusive secondary to cautery artifact.  He had also findings      compatible with interstitial cystitis.  He is now for follow-up cystoscopy      and hydrodistention.            DESCRIPTION OF PROCEDURE:  The patient was seen in the staging area and      subsequently transported to the cystoscopy suite.  He was given Levaquin      500 mg intravenously.  After placing him on the cystoscopy table he was      placed in the dorsolithotomy position, prepped and draped in sterile      fashion for urological procedure.  Intravenous sedation was administered      and then the procedure began after adequate sedation.  The anterior urethra      was normal.  The posterior urethra showed again wide caliber strictures      easily traversed with a 22-French scope.  The prostatic channel showed      occlusion laterally and some reduction in the medial lobe component, most      likely secondary to his Avodart medication.  The bladder was entered      without difficulty.  The ureteral orifices  were normal size, shape and      configuration.  There was some mild trabeculation and as usual, hyperemia      on the bladder neck and floor.  The old scarification area  on the right      lateral wall was easily identified.  The entire bladder was visualized with      30 and 70 degree lenses and no suspicious areas and/or lesions were      noticed.  The bladder was then emptied and the fluid bag raised to      approximately 80-cm above the symphysis pubis and distention of the bladder      began.  The bladder was distended 3 times each with a total of measured      fluid of approximately 500-mL.  This was maximum tolerated and filling      dose.  At the end of this he showed moderate petechiae or glomerularization      on the anterior tissue, posterior wall as well; some compatible, at least      visually, with interstitial cystitis.  This terminated the procedure, the      bladder was emptied, the scope removed.  2% Xylocaine was injected per      urethra and a 16-French Foley catheter placed to a leg bag.            He was awakened, transferred to the gurney and taken to the recovery room      in stable condition.                                                Electronic Signing MD: Denton Meek, MD  ([id number])            D: 03/11/2005 by Denton Meek, MD      T: 03/12/2005 by [id number] ([id number]) ([id number])      cc:  Denton Meek, MD

## 2012-08-24 NOTE — Op Note (Signed)
DATE OF BIRTH:                        [birth year]      ADMISSION DATE:                     02/06/2004            PATIENT LOCATION:                     [id number]            DATE OF PROCEDURE:                   02/06/2004      SURGEON:                            Denton Meek, MD      ASSISTANT(S):                  PREOPERATIVE DIAGNOSES      1.   BLADDER LESION.      2.   BENIGN PROSTATIC HYPERTROPHY.            POSTOPERATIVE DIAGNOSIS:  BENIGN PROSTATIC HYPERTROPHY, MILD.            PROCEDURE:  CYSTOURETHROSCOPY.            INDICATIONS FOR PROCEDURE:  The patient is a 62 year old male who had a      cystoscopy in May 2004 which showed a small papillary lesion just to the      left of the right ureteral orifice.  At resection, the lesion was too      damaged to reveal any true pathological status.  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  He is now for repeat      cystoscopy to reevaluate the bladder after a year.            DESCRIPTION OF PROCEDURE:  The patient was seen in the staging area and      given 500 mg of Levaquin intravenously, subsequently transported to the      cystoscopy suite.  After he was placed on the cystoscopy table, positioned      appropriately, intravenous sedation began.  He was prepped and draped in      sterile fashion for urological procedure.  Cystourethroscopy was initiated      with a 22-French scope with a Foroblique lens.  There were some wide      caliber strictures to include 1 in the proximal to midbulb.  The prostate      channel was 3-4 cm and partially occluded laterally and with medial lobe      component.  This appeared to be much less than it had been a year ago since      he has been on the Avodart. The bladder was entered without difficulty and      thoroughly inspected with both the 30-degree and 70-degree lenses.  The      bladder showed showed some hypervascularity which had had in the past the      old area of scar was seen.  There were no suspicious areas  or new areas of      irregular urothelium.  The bladder was distended a few times and only a few      small  petechiae were noticed.  This terminated the procedure.  The bladder      was drained, the scope removed. Then 2% Xylocaine injected per urethra and      16-French Foley catheter placed with 7 mL in the balloon.  The purpose of      this, he has urinary retentive problems post manipulation and with      anesthesia, he will take this out on his own tomorrow.  The patient was      then transported to the recovery room in stable condition.                                                Electronic Signing MD: Denton Meek, MD  ([id number])            D: 02/06/2004 by Denton Meek, MD      T: 02/07/2004 by [id number] ([id number]) ([id number])      cc:  Denton Meek, MD          Raymondo Band, MD

## 2012-10-15 ENCOUNTER — Other Ambulatory Visit: Payer: Self-pay | Admitting: Internal Medicine

## 2012-10-15 DIAGNOSIS — M797 Fibromyalgia: Secondary | ICD-10-CM

## 2012-10-15 MED ORDER — OXYCODONE-ACETAMINOPHEN 5-325 MG PO TABS
ORAL_TABLET | ORAL | Status: DC
Start: 2012-10-15 — End: 2013-01-22

## 2012-10-15 MED ORDER — OXYCODONE-ACETAMINOPHEN 5-325 MG PO TABS
ORAL_TABLET | ORAL | Status: DC
Start: 2012-10-15 — End: 2012-10-15

## 2012-12-03 ENCOUNTER — Other Ambulatory Visit: Payer: Self-pay

## 2012-12-03 DIAGNOSIS — R5383 Other fatigue: Secondary | ICD-10-CM

## 2012-12-03 MED ORDER — MODAFINIL 100 MG PO TABS
100.0000 mg | ORAL_TABLET | Freq: Three times a day (TID) | ORAL | Status: DC
Start: 2012-12-03 — End: 2013-04-08

## 2012-12-03 NOTE — Telephone Encounter (Signed)
Approved for whichever doc receives request.    Sent from my iPhone    On Nov 27, 2012, at 5:53 PM, "Taiwo Graziosi" [email or web address]> wrote:   Re: Modafinil refill     Hi [name],  I just received the refill and many of the pills are broken and crushed.  I called and they are going to contact you for permission to replace them.  Always something.  Thanks,     Thierry

## 2012-12-03 NOTE — Telephone Encounter (Signed)
Medication called in to pharmacy.

## 2012-12-05 ENCOUNTER — Other Ambulatory Visit: Payer: Self-pay

## 2012-12-05 ENCOUNTER — Telehealth: Payer: Self-pay

## 2012-12-05 DIAGNOSIS — M797 Fibromyalgia: Secondary | ICD-10-CM

## 2012-12-05 MED ORDER — CLONAZEPAM 0.5 MG PO TABS
0.50 mg | ORAL_TABLET | Freq: Three times a day (TID) | ORAL | Status: DC | PRN
Start: 2012-12-05 — End: 2012-12-12

## 2012-12-05 NOTE — Telephone Encounter (Signed)
Patient would like a refill of his Clonazepam medication.    Dr. Alanda Slim refilled a months supply while you were out of the office.

## 2012-12-05 NOTE — Telephone Encounter (Signed)
Medication refill called into [facility] @ [phone].

## 2012-12-05 NOTE — Telephone Encounter (Signed)
I spoke with Dr [name].  I had originally denies med refill based on no office visit for several years per my chart review.  04/2012 phone message reviewed.   Patient takes minimal amount of clonazapem that works for him; no side effects.  I will refill #90 tablets to [facility]. Patient will get remaining refills from Dr Toni Amend, his PCP.

## 2012-12-17 MED ORDER — CLONAZEPAM 0.5 MG PO TABS
0.5000 mg | ORAL_TABLET | Freq: Three times a day (TID) | ORAL | Status: AC | PRN
Start: 2012-12-12 — End: 2013-01-11

## 2012-12-19 NOTE — Telephone Encounter (Signed)
Attempted to call in Rx refill for patient.    [facility] required Rx to be faxed - faxed to [phone].

## 2013-01-03 ENCOUNTER — Other Ambulatory Visit: Payer: Self-pay | Admitting: Internal Medicine

## 2013-01-22 ENCOUNTER — Encounter: Payer: Self-pay | Admitting: Internal Medicine

## 2013-01-22 ENCOUNTER — Ambulatory Visit: Payer: BC Managed Care – PPO | Attending: Internal Medicine | Admitting: Internal Medicine

## 2013-01-22 VITALS — BP 136/72 | HR 68 | Temp 98.3°F | Resp 18 | Ht 69.5 in | Wt 173.0 lb

## 2013-01-22 DIAGNOSIS — E785 Hyperlipidemia, unspecified: Secondary | ICD-10-CM

## 2013-01-22 DIAGNOSIS — N400 Enlarged prostate without lower urinary tract symptoms: Secondary | ICD-10-CM

## 2013-01-22 DIAGNOSIS — E039 Hypothyroidism, unspecified: Secondary | ICD-10-CM

## 2013-01-22 DIAGNOSIS — Z8709 Personal history of other diseases of the respiratory system: Secondary | ICD-10-CM

## 2013-01-22 DIAGNOSIS — E291 Testicular hypofunction: Secondary | ICD-10-CM

## 2013-01-22 DIAGNOSIS — M797 Fibromyalgia: Secondary | ICD-10-CM

## 2013-01-22 DIAGNOSIS — Z0000 Encounter for general adult medical examination without abnormal findings: Secondary | ICD-10-CM

## 2013-01-22 DIAGNOSIS — IMO0001 Reserved for inherently not codable concepts without codable children: Secondary | ICD-10-CM | POA: Insufficient documentation

## 2013-01-22 DIAGNOSIS — E781 Pure hyperglyceridemia: Secondary | ICD-10-CM

## 2013-01-22 DIAGNOSIS — Z23 Encounter for immunization: Secondary | ICD-10-CM

## 2013-01-22 DIAGNOSIS — I10 Essential (primary) hypertension: Secondary | ICD-10-CM

## 2013-01-22 DIAGNOSIS — R5382 Chronic fatigue, unspecified: Secondary | ICD-10-CM

## 2013-01-22 LAB — ECG 12-LEAD
Atrial Rate: 68 {beats}/min
P Axis: 42 degrees
P-R Interval: 162 ms
Q-T Interval: 392 ms
QRS Duration: 112 ms
QTC Calculation (Bezet): 416 ms
R Axis: -4 degrees
T Axis: 55 degrees
Ventricular Rate: 68 {beats}/min

## 2013-01-22 LAB — URINALYSIS
Bilirubin, UA: NEGATIVE
Blood, UA: NEGATIVE
Glucose, UA: NEGATIVE
Ketones UA: NEGATIVE
Leukocyte Esterase, UA: NEGATIVE
Nitrite, UA: NEGATIVE
Protein, UR: NEGATIVE
Specific Gravity UA: 1.022 (ref 1.001–1.035)
Urine pH: 6.5 (ref 5.0–8.0)
Urobilinogen, UA: 0.2 (ref 0.2–2.0)

## 2013-01-22 MED ORDER — OXYCODONE-ACETAMINOPHEN 5-325 MG PO TABS
ORAL_TABLET | ORAL | Status: DC
Start: 2013-03-24 — End: 2013-03-20

## 2013-01-22 MED ORDER — OXYCODONE-ACETAMINOPHEN 5-325 MG PO TABS
ORAL_TABLET | ORAL | Status: DC
Start: 2013-02-21 — End: 2013-01-22

## 2013-01-22 MED ORDER — OXYCODONE-ACETAMINOPHEN 5-325 MG PO TABS
ORAL_TABLET | ORAL | Status: DC
Start: 2013-01-22 — End: 2013-01-22

## 2013-01-22 NOTE — Progress Notes (Signed)
Subjective:       Patient ID: Shannon [name] is a 62 y.o. male.    HPI  1. HTN-no sxs-pt to provide log noted mild increase in office.  2. CHecking testosterone-dose change  3. PTH-testing related to chronic fatigue  4. HDL profile labs due-checking lipids and adding coenzyme Q10-100mg  daily with some fat.  5. Med refills-percocet and clonazepam completed-pt remians stable on these meds without side effects, Functions at a high level. Shows no signs of seeking higher doses and even tries to limit. Pt has been on same dose for more than a few years for percocet and a year for clonazepam. Has tried to wean, but muscle pain worsens. No concerns for abuse noted at this time.  6. Colonoscopy-completely normal-around 2009-10 year follow up.  7. Dental and eyes-up to date-needs 3 crowns.  8. Vaccines-Tdap-needs-pneumovax-needs shingles-come back for shingles  9. Went to [place] recently-no issues  Pt's hx, allergies, meds, socialhx, and family hx all reviewed.  Review of Systems   All other systems reviewed and are negative.            Objective:    Physical Exam      BP 136/72  Pulse 68  Temp 98.3 F (36.8 C) (Oral)  Resp 18  Ht 1.765 m (5' 9.5")  Wt 78.472 kg (173 lb)  BMI 25.19 kg/m2    General Appearance:    Alert, cooperative, no distress, appears stated age   Head:    Normocephalic, without obvious abnormality, atraumatic   Eyes:    PERRL, conjunctiva/corneas clear, EOM's intact, fundi     benign, both eyes        Ears:    Normal TM's and external ear canals, both ears   Nose:   Nares normal, septum midline, mucosa normal, no drainage    or sinus tenderness   Throat:   Lips, mucosa, and tongue normal; teeth and gums normal   Neck:   Supple, symmetrical, trachea midline, no adenopathy;        thyroid:  No enlargement/tenderness/nodules; no carotid    bruit or JVD   Back:     Symmetric, no curvature, ROM normal, no CVA tenderness   Lungs:     Clear to auscultation bilaterally, respirations unlabored   Chest  wall:    No tenderness or deformity   Heart:    Regular rate and rhythm, S1 and S2 normal, no murmur, rub    or gallop   Abdomen:     Soft, non-tender, bowel sounds active all four quadrants,     no masses, no organomegaly   Genitalia:    Declined   Rectal:    Declined   Extremities:   Extremities normal, atraumatic, no cyanosis or edema   Pulses:   2+ and symmetric all extremities   Skin:   Skin color, texture, turgor normal, no rashes or lesions   Lymph nodes:   Cervical, supraclavicular nodes WNL   Neurologic:   Non-focal, normal gait, no tremor, clear and normal cognition. All answers appropriate     Assessment:       See below by issue      Plan:     1. HTN-obtain 10 readings at home and work for better assessment. Cont current meds for now.  2. BPH-controlled  3. Hypothyroid-checking TSH-cont current synthroid dose.  4. Hypogonadism-awaiting repeat testosterone  5. CFS-stable  6. Fibromyalgia-stable-pt states sxs worsen when he stops percocet twice daily and clonazepam. Pt fully alert  and functional in combination with provogil. No evidence of professional or social issues with current meds. Dosing has been stable without request for escalation.  7. Hyperlipidemia-on tricor and zetia-HDL profile pending.  8. Asthma-stable-no current issues  9. Hypovitaminosis D-replacing and checking level.  10. No new issues reported

## 2013-01-24 DIAGNOSIS — R5382 Chronic fatigue, unspecified: Secondary | ICD-10-CM | POA: Insufficient documentation

## 2013-01-24 DIAGNOSIS — E782 Mixed hyperlipidemia: Secondary | ICD-10-CM | POA: Insufficient documentation

## 2013-01-24 DIAGNOSIS — Z8709 Personal history of other diseases of the respiratory system: Secondary | ICD-10-CM | POA: Insufficient documentation

## 2013-01-24 DIAGNOSIS — N400 Enlarged prostate without lower urinary tract symptoms: Secondary | ICD-10-CM | POA: Insufficient documentation

## 2013-01-24 DIAGNOSIS — E039 Hypothyroidism, unspecified: Secondary | ICD-10-CM | POA: Insufficient documentation

## 2013-01-24 DIAGNOSIS — I10 Essential (primary) hypertension: Secondary | ICD-10-CM | POA: Insufficient documentation

## 2013-01-24 DIAGNOSIS — M797 Fibromyalgia: Secondary | ICD-10-CM | POA: Insufficient documentation

## 2013-01-24 DIAGNOSIS — E291 Testicular hypofunction: Secondary | ICD-10-CM | POA: Insufficient documentation

## 2013-01-30 ENCOUNTER — Telehealth: Payer: Self-pay | Admitting: Internal Medicine

## 2013-01-30 DIAGNOSIS — E782 Mixed hyperlipidemia: Secondary | ICD-10-CM

## 2013-01-30 MED ORDER — SIMVASTATIN 20 MG PO TABS
20.0000 mg | ORAL_TABLET | Freq: Every evening | ORAL | Status: DC
Start: 2013-01-30 — End: 2013-12-10

## 2013-01-30 NOTE — Telephone Encounter (Signed)
Reviewed labs:  1. Start simvastatin 20mg  daily-watch for muscle sxs especially given pt is on fibrate for TG and zetia.  2. Start coenzyme Q 10 at 100mg    3. Adding B12 1,038mcg and folic acid 800 mcg if desires given MTHFR heterozygotes. Likely not a risk for VTE.  4. Testosterone level fine and keeping at current dose.  5. PTH WNL-checked given latest comments on relation to CFS and fibromyalgia.  6. Recheck HDL profile in 6 months.

## 2013-02-18 ENCOUNTER — Encounter: Payer: Self-pay | Admitting: Internal Medicine

## 2013-03-20 ENCOUNTER — Other Ambulatory Visit: Payer: Self-pay | Admitting: Internal Medicine

## 2013-03-20 DIAGNOSIS — M797 Fibromyalgia: Secondary | ICD-10-CM

## 2013-03-20 DIAGNOSIS — R5382 Chronic fatigue, unspecified: Secondary | ICD-10-CM

## 2013-03-20 MED ORDER — OXYCODONE-ACETAMINOPHEN 5-325 MG PO TABS
ORAL_TABLET | ORAL | Status: DC
Start: 2013-03-20 — End: 2013-03-20

## 2013-03-20 MED ORDER — CLONAZEPAM 0.5 MG PO TABS
0.5000 mg | ORAL_TABLET | Freq: Three times a day (TID) | ORAL | Status: DC | PRN
Start: 2013-03-20 — End: 2013-09-17

## 2013-03-20 MED ORDER — OXYCODONE-ACETAMINOPHEN 5-325 MG PO TABS
ORAL_TABLET | ORAL | Status: DC
Start: 2013-03-20 — End: 2013-07-03

## 2013-03-20 NOTE — Telephone Encounter (Signed)
I have conferred with patient and requested he try once again to get off of the percocet. While the dose and frequency have not changed for years he remains convinced that it helps his fibromyalgia.

## 2013-03-23 ENCOUNTER — Other Ambulatory Visit: Payer: Self-pay | Admitting: Internal Medicine

## 2013-03-27 ENCOUNTER — Other Ambulatory Visit: Payer: Self-pay

## 2013-03-27 MED ORDER — ALBUTEROL SULFATE HFA 108 (90 BASE) MCG/ACT IN AERS
2.00 | INHALATION_SPRAY | Freq: Four times a day (QID) | RESPIRATORY_TRACT | Status: DC | PRN
Start: 2013-03-27 — End: 2013-07-01

## 2013-03-29 ENCOUNTER — Other Ambulatory Visit: Payer: Self-pay

## 2013-03-29 MED ORDER — RAMIPRIL 10 MG PO CAPS
20.0000 mg | ORAL_CAPSULE | Freq: Every day | ORAL | Status: DC
Start: 2013-03-29 — End: 2013-12-10

## 2013-04-06 ENCOUNTER — Telehealth: Payer: Self-pay

## 2013-04-08 ENCOUNTER — Other Ambulatory Visit: Payer: Self-pay

## 2013-04-08 DIAGNOSIS — R5383 Other fatigue: Secondary | ICD-10-CM

## 2013-04-08 DIAGNOSIS — I10 Essential (primary) hypertension: Secondary | ICD-10-CM

## 2013-04-08 MED ORDER — AMLODIPINE BESYLATE 5 MG PO TABS
5.00 mg | ORAL_TABLET | Freq: Every day | ORAL | Status: DC
Start: 2013-04-08 — End: 2013-12-10

## 2013-04-08 MED ORDER — MODAFINIL 100 MG PO TABS
300.0000 mg | ORAL_TABLET | Freq: Every day | ORAL | Status: DC
Start: 2013-04-08 — End: 2013-11-28

## 2013-04-08 NOTE — Telephone Encounter (Signed)
Sent from my iPhone    Begin forwarded message:  From: Tin Allaire [email or web address]>  Date: April 06, 2013 at 5:57:14 PM EDT  To: "Norina Buzzard. Cheifetz" [email or web address]>  Subject: stuff  Hi [name],  Could I please get a new script for provigil (modafinil) 100 mg; # 270; 3 tabs q AM with one refill?    Regarding Percocet:  I wish it was psychological.  Psychological pain does not wake you up EVERY night.  I refuse to take anything at night so by morning, it is awful.  Every day is a tough day, some are worse than others.  I agree that physical activity does not affect the pain.  I was sleep deprived when we left for the trip trying to tie up loose ends.  We flew all night and started touring when we got there.  Sleep deprivation and stress does have an impact.  In addition, some days are just worse than others.  A new drug was approved by the FDA for fibromyalgia. (Targinig ER)  Maybe this would be better since it is long acting and might help me get off the roller coaster.  I have tried many times to get off Percocet and the pain is intolerable.  Even if I stay in bed all day on the weekend without it, the pain does does not get any better.  I remember years ago when we did even acknowledge there was such an entity as fibromyalgia.  Now we do, but still are not good at treating it.  It has profoundly changed my life and the things I now just can't do anymore.  Not a happy time for me.  I just want to be able to function.    I have lost five pounds as you recommended and added the supplements.  Forgot the dose you recommended of CoQ.  Interested in your thoughts,  Dontavious

## 2013-04-18 ENCOUNTER — Ambulatory Visit (INDEPENDENT_AMBULATORY_CARE_PROVIDER_SITE_OTHER): Payer: BC Managed Care – PPO | Admitting: Rheumatology

## 2013-04-18 VITALS — BP 142/85 | HR 76 | Temp 98.1°F | Ht 68.0 in | Wt 172.0 lb

## 2013-04-18 DIAGNOSIS — M797 Fibromyalgia: Secondary | ICD-10-CM

## 2013-04-18 DIAGNOSIS — R5382 Chronic fatigue, unspecified: Secondary | ICD-10-CM

## 2013-04-18 DIAGNOSIS — G9332 Myalgic encephalomyelitis/chronic fatigue syndrome: Secondary | ICD-10-CM

## 2013-04-18 MED ORDER — DULOXETINE HCL 30 MG PO CPEP
30.0000 mg | ORAL_CAPSULE | Freq: Every day | ORAL | Status: DC
Start: 2013-04-18 — End: 2013-05-07

## 2013-04-18 MED ORDER — AMITRIPTYLINE HCL 25 MG PO TABS
25.0000 mg | ORAL_TABLET | Freq: Every evening | ORAL | Status: DC
Start: 2013-04-18 — End: 2013-05-07

## 2013-04-18 NOTE — Progress Notes (Signed)
Initial Rheumatology Consultation    Chief Complaint:     Chief Complaint   Patient presents with   . Initial Consult     Fibromyalgia          HPI:   This patient is a 62 y.o. year old male referred by [name], MD for FM/chronic fatigue syndrome. He has been diagnosed with this for about 10 years. In 2004 was feeling fine, but underwent general scan, found incidental meningioma s/p resection.  Pre-operatively did develop L heminopsia, follows [facility] and opthalmology.  He developed urinary trouble, underwent cystoscopy and was thought to have central damage from surgery. In 2005, developed severe R hip pain.  L5/-S1 lumbar degeneration with bulging disc and underwent epidural injections/PT which did not help.  MRI revealed L5/-S1 nerve compression and improvement with steroid injection.  In Dec 2005 underwent Hep B booster immunization, and subsequently developed 15lb wt loss, severe fatigue along with pain of the hips/shoulders and legs.  He underwent extensive work up.  He continued to have DOE/dry cough, severe fatigue and myalgias with episodic flares.  He saw neurology/rheumatology/endocrinology.  He had anemia, low testosterone, normal EMG.  He underwent bone marrow biopsy which was normal, normal endoscopy/colonoscopy . He started low dose testosterone.  MRI of hips show bilateral AVN of hips, possibly from high dose steroids during pre-op for meningioma.  He had LP with elevated protein otherwise neg fluid, thought to be from protein.  He followed with PM&R.  He underwent procrit injections with persistent myalgias. He initially started on nsaids which did not help. He then started percocet along with clonazepam which has helped. He has been seeing rheumatology, Dr. Josephine Cables and started on lyrica which did not help.  He had repeat EMG with normal study except disk disease at [facility] in 2008.  There he was started on provigil.  He also has hypothyroidism on synthroid, has been seeing  endocrinology.    In 2006 MRI brain showed borderline small pituitary gland, has had f/up MRI scans.   FM/chronic pain has worsened over the years.  He gets relapses with unexplained wt loss which abates and he can regain the weight. 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   provigil helps his fatigue.  Percocet/clonazepam helps myalgias.   He sleeps ~hours/night. He has AM worse pain.   He describes the pain mainly in the trapezius muscles/paraspinals, posterior thighs.     PMSH:     Past Medical History   Diagnosis Date   . Color blindness    . Hemorrhoids    . Myalgia    . Anemia      Mild   . Hypothyroidism    . Hypertension    . Osteoporosis    . Asthma    . Benign prostate hyperplasia    . Hyperlipidemia    . Hypertriglyceridemia    . Depression      mild, pain related   . DJD (degenerative joint disease)      Lumbar / Sacral   . Avascular necrosis      Bilateral hip   . Sinus problem      Intermittent   . Fatigue    . Cold intolerance    . Muscle pain    . Joint pain    . Snoring    . Lateral epicondylitis      Left   . History of colonoscopy 2008  normal, repeat in 10 years?       Past Surgical History   Procedure Date   . Brain surgery      Partial brain resection secondary to meningioma.    . Tonsillectomy        Allergies:     Allergies   Allergen Reactions   . Bextra (Valdecoxib) Rash       Meds:     Prior to Admission medications    Medication Sig Start Date End Date Taking? Authorizing Provider   albuterol (PROVENTIL HFA) 108 (90 BASE) MCG/ACT inhaler Inhale 2 puffs into the lungs every 6 (six) hours as needed for Wheezing. 03/27/13  Yes Philbert Riser, MD   amLODIPine (NORVASC) 5 MG tablet Take 1 tablet (5 mg total) by mouth daily. 04/08/13  Yes [name], MD   aspirin 81 MG EC tablet Take 81 mg by mouth daily.     Yes [provider]   AVODART 0.5 MG capsule TAKE 1 CAPSULE (0.5 MG TOTAL) BY MOUTH DAILY 05/14/12  Yes [name], MD    Calcium Carbonate-Vitamin D (CALCIUM + D PO) Take by mouth. 500/400mg   daily    Yes [provider]   clonazePAM (KLONOPIN) 0.5 MG tablet Take 1 tablet (0.5 mg total) by mouth 3 (three) times daily as needed for Anxiety (muscle pain and spasm). 03/20/13 03/20/14 Yes [name], MD   DULoxetine (CYMBALTA) 60 MG capsule TAKE 1 CAPSULE DAILY 03/23/13  Yes Allena Earing Emeline Gins, MD   ezetimibe (ZETIA) 10 MG tablet Take 1 tablet (10 mg total) by mouth daily. 08/06/12  Yes [name], MD   fenofibrate (TRICOR) 145 MG tablet TAKE 1 TABLET (145MG ) BY MOUTH DAILY 05/14/12  Yes [name], MD   levothyroxine (SYNTHROID, LEVOTHROID) 200 MCG tablet TAKE 1 TABLET (0.2MG ) BY MOUTH DAILY 05/14/12  Yes [name], MD   modafinil (PROVIGIL) 100 MG tablet Take 3 tablets (300 mg total) by mouth daily. 04/08/13  Yes [name], MD   montelukast (SINGULAIR) 10 MG tablet TAKE 1 TABLET NIGHTLY 01/03/13  Yes [name], MD   Multiple Vitamins-Minerals (CENTRUM SILVER PO) Take 1 tablet by mouth daily.     Yes [provider]   oxyCODONE-acetaminophen (ROXICET) 5-325 MG per tablet 1 tablets every 12 hours 03/20/13  Yes [name], MD   ramipril (ALTACE) 10 MG capsule Take 2 capsules (20 mg total) by mouth daily. 03/29/13  Yes Philbert Riser, MD   ramipril (ALTACE) 10 MG tablet Take 2 tablets (20 mg total) by mouth daily. Take 20mg  po daily 07/19/11  Yes [name], MD   simvastatin (ZOCOR) 20 MG tablet Take 1 tablet (20 mg total) by mouth every evening. 01/30/13 01/30/14 Yes [name], MD   testosterone (ANDROGEL) 50 MG/5GM GEL Place 0.05 g onto the skin daily. 01/30/12  Yes [name], MD   triamcinolone (NASACORT) 55 MCG/ACT nasal inhaler USE 2 SPRAYS NASALLY TWICE DAILY 05/14/12  Yes [name], MD   VITAMIN D, CHOLECALCIFEROL, PO Take by mouth.   Yes [provider]   vitamin E 1000 UNIT capsule Take 2,000 Units by mouth daily.     Yes [provider]       FH:     Family History   Problem Relation Age of Onset   . Asthma Daughter    . Tuberculosis Mother  SH:     History     Social History   . Marital Status: Married     Spouse Name: N/A     Number of Children: N/A   . Years of Education: N/A     Occupational History   . Not on file.     Social History Main Topics   . Smoking status: Never Smoker    . Smokeless tobacco: Not on file   . Alcohol Use: No   . Drug Use: No   . Sexually Active:      Other Topics Concern   . Not on file     Social History Narrative   . No narrative on file       ROS:   All other systems reviewed and negative except as described above.  General ROS: negative for - fever or occ chills, sleeps 8 hours/night  Psychological ROS: no depression  Ophthalmic ROS: hemionopsia  ENT: neg oral/nasal ulcers.   Respiratory ROS: no cough, shortness of breath, or wheezing  +asthma from childhood, uses nasonex, sees pulmonary.   Cardiovascular ROS: no chest pain or dyspnea on exertion  Gastrointestinal ROS: no abdominal pain, change in bowel habits, or black or bloody stools  Musculoskeletal ROS: as per HPI  Neurological ROS: negative for - numbness/tingling or weakness  Dermatological ROS: no rashes.       PHYSICAL EXAM:     Filed Vitals:    04/18/13 1342   BP: 142/85   Pulse: 76   Temp: 98.1 F (36.7 C)       General appearance - alert, well appearing, and in no distress  Mental status - alert, oriented to person, place, and time  Eyes - pupils equal and reactive, extraocular eye movements intact  Nose - normal and patent, no erythema, discharge or polyps  Mouth - mucous membranes moist, pharynx normal without lesions  Neck - supple, no significant adenopathy  Chest - clear to auscultation, no wheezes, rales or rhonchi, symmetric air entry  Heart - normal rate, regular rhythm, normal S1, S2, no murmurs, rubs, clicks or gallops  Abdomen - soft, nontender, nondistended, no masses or organomegaly  Neurological - alert, oriented,  normal speech, no focal findings or movement disorder noted  Musculoskeletal - no joint tenderness, deformity or swelling, no tender FM points.   Extremities - peripheral pulses normal, no pedal edema, no clubbing or cyanosis  Skin - normal coloration and turgor, no rashes, no suspicious skin lesions noted        Labs:   Reviewed extensive records pt brought in  Last labs form 2013 with normal cbc, cmp, esr, and vit D    2014 labs: reportedly also normal studies, pt to fax to [place].         ASSESSMENT:   Shannon [name] is a 63 y.o. male PMH of meningioma s/p resection with subsequent "hormonal "deficiencies now on provigil and percocet/clonazepam for chronic pain syndrome. His symptoms do not seem classic for FM as he lacks tender points, and has more severe myalgias of the trapezius and hamstring muscle groups. He does seem to have chronic pain syndrome. He is interested in starting tarqiniq-- however, this is a narcotic extended pain medication. (it is not a new FDA approved FM medication, but simply a new pain medication).  He can further discuss that option with his PCP, however, advised to try to come off of narcotics.   We discussed at length, the general measures recommended for  FM/chronic pain syndrome and to avoid narcotics.  I recommended a trial of amitriptyline and to decrease cymbalta to avoid risk of serotonin syndrome.  He is willing to try this.  I discussed that the overall plan should be to wean off of narcotics.    Pt has already tried muscle relaxants, lyrica, cymbalta, exercises, injections without success.     PLAN:   1. Trial of amitriptyline 25mg /qhs  2. Decrease cymbalta to 30mg /daily  3. Narcotics--overall weaning if possible, consider pain management referral   4. Start water therapy exercises along with cardio exercises-swimming (increases endorphin levels and decreases overall pain)  5. F/up with PCP,  6. Can consider seeing FM/CFS expert, Dr. Ruthell Rummage of [facility] in [place].

## 2013-04-18 NOTE — Patient Instructions (Signed)
1. Trial of amitriptyline  2. Try to reduce perocet  3. Continue physical therapy exercises  4. Stress reductions  5. Trapezius heat therapy along with stretching/ROM.   6. Will d/w Cr. Cheifetz, f/up as needed.

## 2013-04-18 NOTE — Progress Notes (Signed)
Has the patient sought any care outside of the [facility]? NO    -Refer to care team-   Or  List Specialists:

## 2013-05-07 ENCOUNTER — Other Ambulatory Visit: Payer: Self-pay

## 2013-05-07 DIAGNOSIS — R5382 Chronic fatigue, unspecified: Secondary | ICD-10-CM

## 2013-05-07 DIAGNOSIS — M797 Fibromyalgia: Secondary | ICD-10-CM

## 2013-05-07 DIAGNOSIS — F32A Depression, unspecified: Secondary | ICD-10-CM

## 2013-05-07 MED ORDER — AMITRIPTYLINE HCL 25 MG PO TABS
25.0000 mg | ORAL_TABLET | Freq: Every evening | ORAL | Status: DC
Start: 2013-05-07 — End: 2013-05-31

## 2013-05-07 NOTE — Telephone Encounter (Signed)
Yes, stop cymbalta. Victorino Dike will call in the amitriptyline 25mg .  Rica Mast    -----Original Message-----  From: [name] [[email or web address]]   Sent: Tuesday, May 07, 2013 2:21 PM  To: [name].  Subject: Re: stuff    Am out of both.  Did both at these doses for one month.  Stop Cymbalta?  Your call.  I'll pick up whatever you phone in.  Thanks    Sent from my iPhone    > On May 07, 2013, at 2:01 PM, "Coralyn Mark E." [email or web address]> wrote:  >   > Will order 25mg  with #90 and we can double up if she wants. As for cymbalta if there has been at least a 2 week overlap you can try stopping.   > Craig  >   > -----Original Message-----  > From: [name] [[email or web address]]   > Sent: Tuesday, May 07, 2013 1:59 PM  > To: [name].  > Subject: Re: stuff  >   > You are correct, sorry  > Am out, could you please phone it in to [facility] [phone].  > Think she said we might increase the dose?  > Will come off Cymbalta.  Am out of the 30 mg capsules.  Should I just stop?  Otherwise need more.  > Will slowly wean off Provigil.  > Thanks,  > R  >   > Sent from my iPhone  >   >> On May 07, 2013, at 1:40 PM, "[place], [place] E." [email or web address]> wrote:  >>   >> Daesean,  >> I reviewed her note. You said imipramine, but your med list and note says amitriptyline. My suggestion would be to continue this and try stopping the cymbalta in about two weeks. If doing fine then try to wean off provigil. Based on this I am guessing you need more amitriptyline only?  >> Please advise  >> THx  >> [name]   >>   >> -----Original Message-----  >> From: [name] [[email or web address]]   >> Sent: Tuesday, May 07, 2013 8:19 AM  >> To: [name].  >> Subject: stuff  >>   >> Liam Rogers,  >> Thinking about it last night.  I am shocked that you did not receive a letter.  I made it very clear that you referred me and asked that she send you a letter.  (which  is only common courtesy) Not your fault, just amazed that it did not happen.  It should not be up to you to have to get clarification from her.  Not a perfect world out there.  >>   >> [place]

## 2013-05-11 ENCOUNTER — Encounter: Payer: Self-pay | Admitting: Internal Medicine

## 2013-05-15 NOTE — Telephone Encounter (Signed)
Cap,   Please know I fully trust you. I just refuse to give up on finding something better for you. [place] over the past year and a half has picked up an outstanding young rheumatologist in [place]. I have gotten to know her and refer several folks to her. I have no issue with trying a new med, but lets do it with a new rheum look since this has not occurred in some time. No problem on the provigil. I am back tomorrow after two weeks off and so need to climb out, but will get this done for you. If you approve seeing [name] I will link the two of you. Let me know. If we are successful in ditching the Percocet and the clonazepam (once symptoms are controlled) then the provigil might follow in that the side effects of these two would be gone also.  Thanks  [name]  (scan)    -----Original Message-----  From: Shannon [name] [[email or web address]]   Sent: Saturday, April 06, 2013 5:57 PM  To: [name]  Subject: stuff    Hi [name],  Could I please get a new script for provigil (modafinil) 100 mg; # 270; 3 tabs q AM with one refill?    Regarding Percocet:  I wish it was psychological.  Psychological pain does not wake you up EVERY night.  I refuse to take anything at night so by morning, it is awful.  Every day is a tough day, some are worse than others.  I agree that physical activity does not affect the pain.  I was sleep deprived when we left for the trip trying to tie up loose ends.  We flew all night and started touring when we got there.  Sleep deprivation and stress does have an impact.  In addition, some days are just worse than others.  A new drug was approved by the FDA for fibromyalgia. (Targinig ER)  Maybe this would be better since it is long acting and might help me get off the roller coaster.  I have tried many times to get off Percocet and the pain is intolerable.  Even if I stay in bed all day on the weekend without it, the pain does does not get any better.  I remember years ago when we did  even acknowledge there was such an entity as fibromyalgia.  Now we do, but still are not good at treating it.  It has profoundly changed my life and the things I now just can't do anymore.  Not a happy time for me.  I just want to be able to function.    I have lost five pounds as you recommended and added the supplements.  Forgot the dose you recommended of CoQ.  Interested in your thoughts,  Shannon [name]

## 2013-05-24 ENCOUNTER — Encounter (INDEPENDENT_AMBULATORY_CARE_PROVIDER_SITE_OTHER): Payer: Self-pay

## 2013-05-24 ENCOUNTER — Other Ambulatory Visit: Payer: Self-pay | Admitting: Internal Medicine

## 2013-05-24 DIAGNOSIS — E781 Pure hyperglyceridemia: Secondary | ICD-10-CM

## 2013-05-24 DIAGNOSIS — E039 Hypothyroidism, unspecified: Secondary | ICD-10-CM

## 2013-05-24 MED ORDER — LEVOTHYROXINE SODIUM 200 MCG PO TABS
200.0000 ug | ORAL_TABLET | Freq: Every day | ORAL | Status: DC
Start: 2013-05-24 — End: 2013-12-10

## 2013-05-24 MED ORDER — FENOFIBRATE 145 MG PO TABS
145.0000 mg | ORAL_TABLET | Freq: Every day | ORAL | Status: DC
Start: 2013-05-24 — End: 2013-12-10

## 2013-05-30 ENCOUNTER — Encounter: Payer: Self-pay | Admitting: Internal Medicine

## 2013-05-31 ENCOUNTER — Other Ambulatory Visit: Payer: Self-pay | Admitting: Internal Medicine

## 2013-05-31 DIAGNOSIS — M797 Fibromyalgia: Secondary | ICD-10-CM

## 2013-05-31 MED ORDER — AMITRIPTYLINE HCL 50 MG PO TABS
50.00 mg | ORAL_TABLET | Freq: Every evening | ORAL | Status: DC
Start: 2013-05-31 — End: 2013-12-10

## 2013-06-20 ENCOUNTER — Encounter: Payer: Self-pay | Admitting: Internal Medicine

## 2013-06-22 ENCOUNTER — Encounter: Payer: Self-pay | Admitting: Internal Medicine

## 2013-06-28 ENCOUNTER — Other Ambulatory Visit: Payer: Self-pay

## 2013-06-28 DIAGNOSIS — J4520 Mild intermittent asthma, uncomplicated: Secondary | ICD-10-CM

## 2013-07-01 MED ORDER — ALBUTEROL SULFATE HFA 108 (90 BASE) MCG/ACT IN AERS
2.0000 | INHALATION_SPRAY | Freq: Four times a day (QID) | RESPIRATORY_TRACT | Status: DC | PRN
Start: 2013-06-28 — End: 2013-12-10

## 2013-07-03 ENCOUNTER — Other Ambulatory Visit: Payer: Self-pay | Admitting: Internal Medicine

## 2013-07-03 DIAGNOSIS — M797 Fibromyalgia: Secondary | ICD-10-CM

## 2013-07-03 MED ORDER — OXYCODONE-ACETAMINOPHEN 5-325 MG PO TABS
ORAL_TABLET | ORAL | Status: DC
Start: 2013-07-03 — End: 2013-09-17

## 2013-07-03 MED ORDER — OXYCODONE-ACETAMINOPHEN 5-325 MG PO TABS
ORAL_TABLET | ORAL | Status: DC
Start: 2013-07-03 — End: 2013-07-03

## 2013-08-19 ENCOUNTER — Encounter: Payer: Self-pay | Admitting: Internal Medicine

## 2013-08-19 ENCOUNTER — Other Ambulatory Visit: Payer: Self-pay

## 2013-08-19 MED ORDER — DUTASTERIDE 0.5 MG PO CAPS
0.5000 mg | ORAL_CAPSULE | Freq: Every day | ORAL | Status: DC
Start: 2013-08-19 — End: 2013-08-22

## 2013-08-20 ENCOUNTER — Other Ambulatory Visit: Payer: Self-pay

## 2013-08-20 ENCOUNTER — Other Ambulatory Visit: Payer: Self-pay | Admitting: Family Medicine

## 2013-08-20 MED ORDER — TESTOSTERONE 50 MG/5GM (1%) TD GEL
50.0000 mg | Freq: Every day | TRANSDERMAL | Status: DC
Start: 2013-08-20 — End: 2013-08-28

## 2013-08-20 MED ORDER — TESTOSTERONE 50 MG/5GM (1%) TD GEL
50.0000 mg | Freq: Every day | TRANSDERMAL | Status: DC
Start: 2013-08-20 — End: 2013-08-20

## 2013-08-21 NOTE — Telephone Encounter (Signed)
Prescription faxed to [facility]

## 2013-08-22 ENCOUNTER — Other Ambulatory Visit: Payer: Self-pay | Admitting: Family Medicine

## 2013-08-22 DIAGNOSIS — N400 Enlarged prostate without lower urinary tract symptoms: Secondary | ICD-10-CM

## 2013-08-22 MED ORDER — FINASTERIDE 5 MG PO TABS
5.0000 mg | ORAL_TABLET | Freq: Every day | ORAL | Status: DC
Start: 2013-08-22 — End: 2013-12-10

## 2013-08-26 ENCOUNTER — Encounter: Payer: Self-pay | Admitting: Internal Medicine

## 2013-08-28 ENCOUNTER — Other Ambulatory Visit: Payer: Self-pay | Admitting: Internal Medicine

## 2013-08-28 DIAGNOSIS — E291 Testicular hypofunction: Secondary | ICD-10-CM

## 2013-08-28 MED ORDER — TESTOSTERONE 50 MG/5GM (1%) TD GEL
50.0000 mg | Freq: Every day | TRANSDERMAL | Status: DC
Start: 2013-08-28 — End: 2013-11-11

## 2013-09-14 ENCOUNTER — Encounter: Payer: Self-pay | Admitting: Internal Medicine

## 2013-09-17 ENCOUNTER — Other Ambulatory Visit: Payer: Self-pay | Admitting: Internal Medicine

## 2013-09-17 DIAGNOSIS — M797 Fibromyalgia: Secondary | ICD-10-CM

## 2013-09-17 DIAGNOSIS — R5382 Chronic fatigue, unspecified: Secondary | ICD-10-CM

## 2013-09-17 MED ORDER — OXYCODONE-ACETAMINOPHEN 5-325 MG PO TABS
ORAL_TABLET | ORAL | Status: DC
Start: 2013-09-17 — End: 2013-09-17

## 2013-09-17 MED ORDER — OXYCODONE-ACETAMINOPHEN 5-325 MG PO TABS
ORAL_TABLET | ORAL | Status: DC
Start: 2013-09-17 — End: 2013-10-07

## 2013-09-17 MED ORDER — CLONAZEPAM 0.5 MG PO TABS
0.5000 mg | ORAL_TABLET | Freq: Three times a day (TID) | ORAL | Status: DC | PRN
Start: 2013-09-17 — End: 2013-10-07

## 2013-10-07 ENCOUNTER — Telehealth: Payer: Self-pay | Admitting: Family Medicine

## 2013-10-07 DIAGNOSIS — R5382 Chronic fatigue, unspecified: Secondary | ICD-10-CM

## 2013-10-07 DIAGNOSIS — M797 Fibromyalgia: Secondary | ICD-10-CM

## 2013-10-07 MED ORDER — OXYCODONE-ACETAMINOPHEN 5-325 MG PO TABS
ORAL_TABLET | ORAL | Status: DC
Start: 2013-10-07 — End: 2013-12-10

## 2013-10-07 MED ORDER — CLONAZEPAM 0.5 MG PO TABS
0.5000 mg | ORAL_TABLET | Freq: Three times a day (TID) | ORAL | Status: DC | PRN
Start: 2013-10-07 — End: 2013-12-10

## 2013-10-07 NOTE — Telephone Encounter (Signed)
pcp out of the office and requests 2 week script on percocet and Clonazepam.  Ordered.

## 2013-10-30 ENCOUNTER — Telehealth: Payer: Self-pay | Admitting: Internal Medicine

## 2013-10-30 NOTE — Telephone Encounter (Signed)
Pt sent email that his fibromyalgia is worsening. Pt asking for more percocet. I shared data that narcotics does not improve sxs. Pt has seen several rheum docs over the years with no improvement. NSAIDS do not help. Pt feels more percocet and clonazepam will help. Say prior accupuncture has not helped. I have told pt I will confer with Dr. Sherrie Mustache from pain management prior to changing or adding any meds.

## 2013-11-03 ENCOUNTER — Other Ambulatory Visit: Payer: Self-pay | Admitting: Internal Medicine

## 2013-11-03 MED ORDER — LEVETIRACETAM 500 MG PO TABS
500.0000 mg | ORAL_TABLET | Freq: Two times a day (BID) | ORAL | Status: DC
Start: 2013-11-03 — End: 2015-11-05

## 2013-11-11 ENCOUNTER — Other Ambulatory Visit: Payer: Self-pay

## 2013-11-11 DIAGNOSIS — E291 Testicular hypofunction: Secondary | ICD-10-CM

## 2013-11-12 MED ORDER — TESTOSTERONE 50 MG/5GM (1%) TD GEL
50.0000 mg | Freq: Every day | TRANSDERMAL | Status: DC
Start: 2013-11-11 — End: 2013-11-29

## 2013-11-12 NOTE — Telephone Encounter (Signed)
Faxed to [facility] and mailed to patient.

## 2013-11-13 ENCOUNTER — Other Ambulatory Visit: Payer: Self-pay

## 2013-11-13 DIAGNOSIS — E291 Testicular hypofunction: Secondary | ICD-10-CM

## 2013-11-28 ENCOUNTER — Telehealth: Payer: Self-pay | Admitting: Internal Medicine

## 2013-11-28 DIAGNOSIS — R5383 Other fatigue: Secondary | ICD-10-CM

## 2013-11-28 DIAGNOSIS — E785 Hyperlipidemia, unspecified: Secondary | ICD-10-CM

## 2013-11-28 MED ORDER — EZETIMIBE 10 MG PO TABS
10.0000 mg | ORAL_TABLET | Freq: Every day | ORAL | Status: DC
Start: 2013-11-28 — End: 2013-12-10

## 2013-11-28 MED ORDER — MODAFINIL 100 MG PO TABS
300.0000 mg | ORAL_TABLET | Freq: Every day | ORAL | Status: DC
Start: 2013-11-28 — End: 2013-11-29

## 2013-11-28 NOTE — Telephone Encounter (Signed)
Chronic med refill

## 2013-11-29 ENCOUNTER — Other Ambulatory Visit: Payer: Self-pay | Admitting: Internal Medicine

## 2013-11-29 DIAGNOSIS — R5383 Other fatigue: Secondary | ICD-10-CM

## 2013-11-29 DIAGNOSIS — G9332 Myalgic encephalomyelitis/chronic fatigue syndrome: Secondary | ICD-10-CM

## 2013-11-29 DIAGNOSIS — E291 Testicular hypofunction: Secondary | ICD-10-CM

## 2013-11-29 DIAGNOSIS — R5382 Chronic fatigue, unspecified: Secondary | ICD-10-CM

## 2013-11-29 DIAGNOSIS — M797 Fibromyalgia: Secondary | ICD-10-CM

## 2013-11-29 MED ORDER — MODAFINIL 100 MG PO TABS
300.0000 mg | ORAL_TABLET | Freq: Every day | ORAL | Status: DC
Start: 2013-11-29 — End: 2013-12-10

## 2013-11-29 MED ORDER — TESTOSTERONE 50 MG/5GM (1%) TD GEL
50.0000 mg | Freq: Every day | TRANSDERMAL | Status: DC
Start: 2013-11-29 — End: 2013-12-10

## 2013-12-09 ENCOUNTER — Encounter: Payer: Self-pay | Admitting: Internal Medicine

## 2013-12-10 ENCOUNTER — Other Ambulatory Visit: Payer: Self-pay | Admitting: Internal Medicine

## 2013-12-10 DIAGNOSIS — R5383 Other fatigue: Secondary | ICD-10-CM

## 2013-12-10 DIAGNOSIS — E785 Hyperlipidemia, unspecified: Secondary | ICD-10-CM

## 2013-12-10 DIAGNOSIS — J4520 Mild intermittent asthma, uncomplicated: Secondary | ICD-10-CM

## 2013-12-10 DIAGNOSIS — R5382 Chronic fatigue, unspecified: Secondary | ICD-10-CM

## 2013-12-10 DIAGNOSIS — E291 Testicular hypofunction: Secondary | ICD-10-CM

## 2013-12-10 DIAGNOSIS — N400 Enlarged prostate without lower urinary tract symptoms: Secondary | ICD-10-CM

## 2013-12-10 DIAGNOSIS — M797 Fibromyalgia: Secondary | ICD-10-CM

## 2013-12-10 DIAGNOSIS — E781 Pure hyperglyceridemia: Secondary | ICD-10-CM

## 2013-12-10 DIAGNOSIS — E782 Mixed hyperlipidemia: Secondary | ICD-10-CM

## 2013-12-10 DIAGNOSIS — G9332 Myalgic encephalomyelitis/chronic fatigue syndrome: Secondary | ICD-10-CM

## 2013-12-10 DIAGNOSIS — I10 Essential (primary) hypertension: Secondary | ICD-10-CM

## 2013-12-10 DIAGNOSIS — E039 Hypothyroidism, unspecified: Secondary | ICD-10-CM

## 2013-12-10 MED ORDER — AMLODIPINE BESYLATE 5 MG PO TABS
5.0000 mg | ORAL_TABLET | Freq: Every day | ORAL | Status: DC
Start: 2013-12-10 — End: 2014-02-13

## 2013-12-10 MED ORDER — EZETIMIBE 10 MG PO TABS
10.0000 mg | ORAL_TABLET | Freq: Every day | ORAL | Status: DC
Start: 2013-12-10 — End: 2014-02-13

## 2013-12-10 MED ORDER — FINASTERIDE 5 MG PO TABS
5.0000 mg | ORAL_TABLET | Freq: Every day | ORAL | Status: DC
Start: 2013-12-10 — End: 2014-02-13

## 2013-12-10 MED ORDER — SIMVASTATIN 20 MG PO TABS
20.0000 mg | ORAL_TABLET | Freq: Every evening | ORAL | Status: DC
Start: 2013-12-10 — End: 2014-02-13

## 2013-12-10 MED ORDER — MODAFINIL 100 MG PO TABS
300.0000 mg | ORAL_TABLET | Freq: Every day | ORAL | Status: DC
Start: 2013-12-10 — End: 2014-02-13

## 2013-12-10 MED ORDER — LEVOTHYROXINE SODIUM 200 MCG PO TABS
200.0000 ug | ORAL_TABLET | Freq: Every day | ORAL | Status: DC
Start: 2013-12-10 — End: 2014-02-13

## 2013-12-10 MED ORDER — ALBUTEROL SULFATE HFA 108 (90 BASE) MCG/ACT IN AERS
2.0000 | INHALATION_SPRAY | Freq: Four times a day (QID) | RESPIRATORY_TRACT | Status: DC | PRN
Start: 2013-12-10 — End: 2014-02-13

## 2013-12-10 MED ORDER — TESTOSTERONE 50 MG/5GM (1%) TD GEL
2.5000 g | Freq: Every day | TRANSDERMAL | Status: DC
Start: 2013-12-10 — End: 2013-12-10

## 2013-12-10 MED ORDER — OXYCODONE-ACETAMINOPHEN 5-325 MG PO TABS
ORAL_TABLET | ORAL | Status: DC
Start: 2013-12-10 — End: 2014-01-16

## 2013-12-10 MED ORDER — OXYCODONE-ACETAMINOPHEN 5-325 MG PO TABS
ORAL_TABLET | ORAL | Status: DC
Start: 2013-12-10 — End: 2013-12-10

## 2013-12-10 MED ORDER — RAMIPRIL 10 MG PO CAPS
20.0000 mg | ORAL_CAPSULE | Freq: Every day | ORAL | Status: DC
Start: 2013-12-10 — End: 2014-02-13

## 2013-12-10 MED ORDER — FENOFIBRATE 145 MG PO TABS
145.0000 mg | ORAL_TABLET | Freq: Every day | ORAL | Status: DC
Start: 2013-12-10 — End: 2014-02-13

## 2013-12-10 MED ORDER — TESTOSTERONE 50 MG/5GM (1%) TD GEL
2.5000 g | Freq: Every day | TRANSDERMAL | Status: DC
Start: 2013-12-10 — End: 2014-02-13

## 2013-12-10 MED ORDER — CLONAZEPAM 0.5 MG PO TABS
0.5000 mg | ORAL_TABLET | Freq: Three times a day (TID) | ORAL | Status: DC | PRN
Start: 2013-12-10 — End: 2014-02-13

## 2013-12-10 MED ORDER — MONTELUKAST SODIUM 10 MG PO TABS
10.0000 mg | ORAL_TABLET | Freq: Every evening | ORAL | Status: DC
Start: 2013-12-10 — End: 2014-01-16

## 2013-12-10 MED ORDER — AMITRIPTYLINE HCL 50 MG PO TABS
50.0000 mg | ORAL_TABLET | Freq: Every evening | ORAL | Status: DC
Start: 2013-12-10 — End: 2014-02-13

## 2013-12-24 ENCOUNTER — Other Ambulatory Visit: Payer: Self-pay | Admitting: Family Medicine

## 2013-12-28 ENCOUNTER — Other Ambulatory Visit: Payer: Self-pay | Admitting: Internal Medicine

## 2013-12-28 DIAGNOSIS — J4521 Mild intermittent asthma with (acute) exacerbation: Secondary | ICD-10-CM

## 2014-01-16 ENCOUNTER — Other Ambulatory Visit: Payer: Self-pay | Admitting: Internal Medicine

## 2014-01-16 ENCOUNTER — Encounter: Payer: Self-pay | Admitting: Internal Medicine

## 2014-01-16 DIAGNOSIS — M797 Fibromyalgia: Secondary | ICD-10-CM

## 2014-01-16 MED ORDER — OXYCODONE-ACETAMINOPHEN 5-325 MG PO TABS
ORAL_TABLET | ORAL | Status: DC
Start: 2014-01-16 — End: 2014-02-13

## 2014-01-16 MED ORDER — OXYCODONE-ACETAMINOPHEN 5-325 MG PO TABS
ORAL_TABLET | ORAL | Status: DC
Start: 2014-01-16 — End: 2014-01-16

## 2014-01-16 NOTE — Telephone Encounter (Signed)
Refilling 3 months of pt's percocet. No issues. No sedation. No difficulty with work. No constipation-no change in dose. Still working to wean pt off, but psychologically fearful of pain and reluctant to wean. No reported patient issues for last several years while on same med dose.

## 2014-01-17 ENCOUNTER — Encounter: Payer: Self-pay | Admitting: Internal Medicine

## 2014-02-13 ENCOUNTER — Telehealth: Payer: Self-pay

## 2014-02-13 ENCOUNTER — Telehealth: Payer: Self-pay | Admitting: Internal Medicine

## 2014-02-13 ENCOUNTER — Ambulatory Visit: Payer: BC Managed Care – PPO | Attending: Internal Medicine | Admitting: Internal Medicine

## 2014-02-13 ENCOUNTER — Encounter: Payer: Self-pay | Admitting: Internal Medicine

## 2014-02-13 VITALS — BP 119/69 | HR 51 | Temp 97.5°F | Resp 20 | Ht 70.0 in | Wt 169.2 lb

## 2014-02-13 DIAGNOSIS — M797 Fibromyalgia: Secondary | ICD-10-CM

## 2014-02-13 DIAGNOSIS — R5381 Other malaise: Secondary | ICD-10-CM

## 2014-02-13 DIAGNOSIS — G9331 Postviral fatigue syndrome: Secondary | ICD-10-CM

## 2014-02-13 DIAGNOSIS — E291 Testicular hypofunction: Secondary | ICD-10-CM

## 2014-02-13 DIAGNOSIS — IMO0001 Reserved for inherently not codable concepts without codable children: Secondary | ICD-10-CM

## 2014-02-13 DIAGNOSIS — D649 Anemia, unspecified: Secondary | ICD-10-CM

## 2014-02-13 DIAGNOSIS — J45901 Unspecified asthma with (acute) exacerbation: Secondary | ICD-10-CM

## 2014-02-13 DIAGNOSIS — R5382 Chronic fatigue, unspecified: Secondary | ICD-10-CM

## 2014-02-13 DIAGNOSIS — G9332 Myalgic encephalomyelitis/chronic fatigue syndrome: Secondary | ICD-10-CM

## 2014-02-13 DIAGNOSIS — E785 Hyperlipidemia, unspecified: Secondary | ICD-10-CM

## 2014-02-13 DIAGNOSIS — R5383 Other fatigue: Secondary | ICD-10-CM

## 2014-02-13 DIAGNOSIS — E781 Pure hyperglyceridemia: Secondary | ICD-10-CM

## 2014-02-13 DIAGNOSIS — I10 Essential (primary) hypertension: Secondary | ICD-10-CM

## 2014-02-13 DIAGNOSIS — J45909 Unspecified asthma, uncomplicated: Secondary | ICD-10-CM

## 2014-02-13 DIAGNOSIS — M810 Age-related osteoporosis without current pathological fracture: Secondary | ICD-10-CM

## 2014-02-13 DIAGNOSIS — M8700 Idiopathic aseptic necrosis of unspecified bone: Secondary | ICD-10-CM

## 2014-02-13 DIAGNOSIS — R7989 Other specified abnormal findings of blood chemistry: Secondary | ICD-10-CM

## 2014-02-13 DIAGNOSIS — N400 Enlarged prostate without lower urinary tract symptoms: Secondary | ICD-10-CM

## 2014-02-13 DIAGNOSIS — E039 Hypothyroidism, unspecified: Secondary | ICD-10-CM

## 2014-02-13 DIAGNOSIS — J4521 Mild intermittent asthma with (acute) exacerbation: Secondary | ICD-10-CM

## 2014-02-13 DIAGNOSIS — J4520 Mild intermittent asthma, uncomplicated: Secondary | ICD-10-CM

## 2014-02-13 DIAGNOSIS — G933 Postviral fatigue syndrome: Secondary | ICD-10-CM

## 2014-02-13 LAB — ECG 12-LEAD
Atrial Rate: 57 {beats}/min
P Axis: 32 degrees
P-R Interval: 168 ms
Q-T Interval: 432 ms
QRS Duration: 112 ms
QTC Calculation (Bezet): 420 ms
R Axis: -6 degrees
T Axis: 25 degrees
Ventricular Rate: 57 {beats}/min

## 2014-02-13 MED ORDER — AMLODIPINE BESYLATE 5 MG PO TABS
5.0000 mg | ORAL_TABLET | Freq: Every day | ORAL | Status: DC
Start: 2014-02-13 — End: 2014-09-09

## 2014-02-13 MED ORDER — EZETIMIBE 10 MG PO TABS
10.0000 mg | ORAL_TABLET | Freq: Every day | ORAL | Status: DC
Start: 2014-02-13 — End: 2014-09-09

## 2014-02-13 MED ORDER — OXYCODONE-ACETAMINOPHEN 5-325 MG PO TABS
ORAL_TABLET | ORAL | Status: DC
Start: 2014-03-05 — End: 2014-02-13

## 2014-02-13 MED ORDER — OXYCODONE-ACETAMINOPHEN 5-325 MG PO TABS
ORAL_TABLET | ORAL | Status: DC
Start: 2014-03-05 — End: 2014-05-29

## 2014-02-13 MED ORDER — MONTELUKAST SODIUM 10 MG PO TABS
10.0000 mg | ORAL_TABLET | Freq: Every evening | ORAL | Status: DC
Start: 2014-02-13 — End: 2014-09-09

## 2014-02-13 MED ORDER — FENOFIBRATE 145 MG PO TABS
145.0000 mg | ORAL_TABLET | Freq: Every day | ORAL | Status: DC
Start: 2014-02-13 — End: 2014-09-09

## 2014-02-13 MED ORDER — FINASTERIDE 5 MG PO TABS
5.0000 mg | ORAL_TABLET | Freq: Every day | ORAL | Status: DC
Start: 2014-02-13 — End: 2015-01-19

## 2014-02-13 MED ORDER — MODAFINIL 100 MG PO TABS
300.0000 mg | ORAL_TABLET | Freq: Every day | ORAL | Status: DC
Start: 2014-02-13 — End: 2014-03-21

## 2014-02-13 MED ORDER — ALBUTEROL SULFATE HFA 108 (90 BASE) MCG/ACT IN AERS
2.0000 | INHALATION_SPRAY | Freq: Four times a day (QID) | RESPIRATORY_TRACT | Status: DC | PRN
Start: 2014-02-13 — End: 2014-06-03

## 2014-02-13 MED ORDER — LEVOTHYROXINE SODIUM 200 MCG PO TABS
200.0000 ug | ORAL_TABLET | Freq: Every day | ORAL | Status: DC
Start: 2014-02-13 — End: 2014-09-09

## 2014-02-13 MED ORDER — CLONAZEPAM 0.5 MG PO TABS
0.5000 mg | ORAL_TABLET | Freq: Three times a day (TID) | ORAL | Status: DC | PRN
Start: 2014-02-13 — End: 2014-05-29

## 2014-02-13 MED ORDER — TESTOSTERONE 50 MG/5GM (1%) TD GEL
2.5000 g | Freq: Every day | TRANSDERMAL | Status: DC
Start: 2014-02-13 — End: 2014-09-09

## 2014-02-13 MED ORDER — RAMIPRIL 10 MG PO CAPS
20.0000 mg | ORAL_CAPSULE | Freq: Every day | ORAL | Status: DC
Start: 2014-02-13 — End: 2014-09-09

## 2014-02-13 NOTE — Telephone Encounter (Signed)
Sleep study instruction   [email or web address]      [facility] [address]  [place], [place] [address]  T [phone]  F [phone]    [facility] [address]  [address], [address]  [place], [place] [address]  T [phone]  [name] [phone]    [facility] [address]  [address]  [place], [place] [address]  T [phone]  [name] [phone]          This communication may contain confidential and/or privileged information. Additionally, this communication may contain protected health information (PHI) that is legally protected from inappropriate disclosure by the Privacy Standards of the [employer] Portability and Accountability Act (HIPAA) and relevant [employer]. If you are not the intended recipient, please note that any dissemination, distribution or copying of this communication is strictly prohibited. If you have received this message in error, you should notify the sender immediately by telephone or by return e-mail and delete this message from your computer. Direct questions to the Personal assistant at [phone].

## 2014-02-13 NOTE — Progress Notes (Signed)
Subjective:       Patient ID: Shannon [name] is a 63 y.o. male.    HPI   1. Sleep study-home indicated-bring to [employer]. Pt with sxs consistent with sleep apnea. High Epworth scale. Study indicated.   2. HTN-no issues  3. Fatigue/CFS-terrible-correlates with weight loss-exercise three times a week. Muscle pains much better with klonopin, but needs the provigil to balance sleepiness side effects. Works perfect and zero issues at work. ALso notes weaning percocet pain flares has been able to maintain same dose of two tabs daily. No sedation from percocet alone. Pain and fatigue are separate and different from each other. No one med alone treats both. Lyica and cymbalta had no improvement. Keppra no improvement. Exercise, clonazepam, and percocet with provigil has allowed patient to maintain a functional life with no side effects impacting performance. Pt feels CFS and FMS are flaring and he loses weight every time this happens.  4. 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 This is consistent with the pt having prior low cortisol levels and  Hypothyroidism. This all occurred after a large menigioma removal which had caused brain edema and midline shift at the time.  5. Asthma stable-treats flares with prn albuterol.  6. BPH controlled  7. Colonoscopy due 2018.  8. Depression-coping and dealing with medical. Marital situation better now. Pt 62.   9. Dexa-hx of hypogonadism,   10. Prevnar 13-next year.   11. Shingles vaccine today TODAY  12. Dental and vision ok.     Past Medical History   Diagnosis Date   . Color blindness    . Hemorrhoids    . Myalgia    . Anemia      Mild   . Hypothyroidism    . Hypertension    . Osteoporosis    . Asthma    . Benign prostate hyperplasia    . Hyperlipidemia    . Hypertriglyceridemia    . Depression      mild, pain related   . DJD (degenerative joint disease)      Lumbar / Sacral   . Avascular necrosis      Bilateral hip   . Sinus  problem      Intermittent   . Fatigue    . Cold intolerance    . Muscle pain    . Joint pain    . Snoring    . Lateral epicondylitis      Left   . History of colonoscopy 2008     normal, repeat in 10 years?   . Hypogonadism male    . Low serum vitamin D        Past Surgical History   Procedure Laterality Date   . Brain surgery       Partial brain resection secondary to meningioma.    . Tonsillectomy         Allergies   Allergen Reactions   . Bextra [Valdecoxib] Rash       Current Outpatient Prescriptions on File Prior to Visit   Medication Sig Dispense Refill   . aspirin 81 MG EC tablet Take 81 mg by mouth daily.       . Calcium Carbonate-Vitamin D (CALCIUM + D PO) Take by mouth. 500/400mg   daily      . levETIRAcetam (KEPPRA) 500 MG tablet Take 1 tablet (500 mg total) by mouth 2 (two) times daily. 60 tablet 3   . Multiple Vitamins-Minerals (CENTRUM SILVER  PO) Take 1 tablet by mouth daily.       [name] triamcinolone (NASACORT) 55 MCG/ACT nasal inhaler USE 2 SPRAYS NASALLY TWICE DAILY 4 Inhaler 3   . VITAMIN D, CHOLECALCIFEROL, PO Take by mouth.     . vitamin E 1000 UNIT capsule Take 2,000 Units by mouth daily.       . [DISCONTINUED] albuterol (PROVENTIL HFA;VENTOLIN HFA) 108 (90 BASE) MCG/ACT inhaler Inhale 2 puffs into the lungs every 6 (six) hours as needed for Wheezing (2 puffs into lungs every six hours as needed for wheezing.). 1 each 3   . [DISCONTINUED] amLODIPine (NORVASC) 5 MG tablet Take 1 tablet (5 mg total) by mouth daily. 90 tablet 3   . [DISCONTINUED] clonazePAM (KLONOPIN) 0.5 MG tablet Take 1 tablet (0.5 mg total) by mouth 3 (three) times daily as needed for Anxiety (muscle pain and spasm). 42 tablet 5   . [DISCONTINUED] ezetimibe (ZETIA) 10 MG tablet Take 1 tablet (10 mg total) by mouth daily. 90 tablet 3   . [DISCONTINUED] fenofibrate (TRICOR) 145 MG tablet Take 1 tablet (145 mg total) by mouth daily. 90 tablet 3   . [DISCONTINUED] finasteride (PROSCAR) 5 MG tablet Take 1 tablet (5 mg total) by mouth  daily. 30 tablet 0   . [DISCONTINUED] levothyroxine (SYNTHROID, LEVOTHROID) 200 MCG tablet Take 1 tablet (200 mcg total) by mouth daily. 90 tablet 3   . [DISCONTINUED] modafinil (PROVIGIL) 100 MG tablet Take 3 tablets (300 mg total) by mouth daily. 270 tablet 5   . [DISCONTINUED] montelukast (SINGULAIR) 10 MG tablet TAKE 1 TABLET NIGHTLY 90 tablet 3   . [DISCONTINUED] oxyCODONE-acetaminophen (ROXICET) 5-325 MG per tablet 1 tablets every 12 hours 60 tablet 0   . [DISCONTINUED] ramipril (ALTACE) 10 MG capsule Take 2 capsules (20 mg total) by mouth daily. 180 capsule 3   . [DISCONTINUED] ramipril (ALTACE) 10 MG tablet Take 2 tablets (20 mg total) by mouth daily. Take 20mg  po daily 180 tablet 3   . [DISCONTINUED] testosterone (ANDROGEL) 50 MG/5GM (1%) Gel Place 2.5 g onto the skin daily. 45 Package 5   . [DISCONTINUED] amitriptyline (ELAVIL) 50 MG tablet Take 1 tablet (50 mg total) by mouth nightly. 90 tablet 3   . [DISCONTINUED] DULoxetine (CYMBALTA) 60 MG capsule TAKE 1 CAPSULE DAILY 90 capsule 1   . [DISCONTINUED] simvastatin (ZOCOR) 20 MG tablet Take 1 tablet (20 mg total) by mouth every evening. 90 tablet 3     No current facility-administered medications on file prior to visit.       History     Social History   . Marital Status: Married     Spouse Name: N/A     Number of Children: N/A   . Years of Education: N/A     Occupational History   . Not on file.     Social History Main Topics   . Smoking status: Never Smoker    . Smokeless tobacco: Not on file   . Alcohol Use: No   . Drug Use: No   . Sexual Activity: Not on file     Other Topics Concern   . Not on file     Social History Narrative       Family History   Problem Relation Age of Onset   . Asthma Daughter    . Tuberculosis Mother            Review of Systems   Constitutional: Positive for activity change, fatigue and unexpected weight  change. Negative for diaphoresis and appetite change.   HENT: Negative.    Eyes: Negative.    Respiratory: Negative.     Cardiovascular: Negative.    Gastrointestinal: Negative.    Endocrine: Negative.    Genitourinary: Negative.    Musculoskeletal: Positive for myalgias. Negative for back pain, arthralgias, gait problem and neck stiffness.   Skin: Negative.    Allergic/Immunologic: Negative.    Neurological: Negative.    Hematological: Negative.    Psychiatric/Behavioral: Positive for sleep disturbance. Negative for dysphoric mood.           Objective:    Physical Exam  BP 119/69 mmHg  Pulse 51  Temp(Src) 97.5 F (36.4 C) (Oral)  Resp 20  Ht 1.778 m (5\' 10" )  Wt 76.749 kg (169 lb 3.2 oz)  BMI 24.28 kg/m2  SpO2 100%    General Appearance:    Alert, cooperative, no distress, appears stated age. Always dysthmic.   Head:    Normocephalic, without obvious abnormality, atraumatic   Eyes:    PERRL, conjunctiva/corneas clear, EOM's intact, fundi     benign, both eyes        Ears:    Normal TM's and external ear canals, both ears   Nose:   Nares normal, septum midline, mucosa normal, no drainage    or sinus tenderness   Throat:   Lips, mucosa, and tongue normal; teeth and gums normal   Neck:   Supple, symmetrical, trachea midline, no adenopathy;        thyroid:  No enlargement/tenderness/nodules; no carotid    bruit or JVD   Back:     Symmetric, no curvature, ROM normal, no CVA tenderness   Lungs:     Clear to auscultation bilaterally, respirations unlabored   Chest wall:    No tenderness or deformity   Heart:    Regular rate and rhythm, S1 and S2 normal, no murmur, rub    or gallop   Abdomen:     Soft, non-tender, bowel sounds active all four quadrants,     no masses, no organomegaly   Genitalia:    Normal male without lesion, discharge or tenderness   Rectal:    Normal tone, normal prostate, no masses or tenderness;    guaiac negative stool   Extremities:   Extremities normal, atraumatic, no cyanosis or edema. No classic tender points.   Pulses:   2+ and symmetric all extremities   Skin:   Skin color, texture, turgor normal, no  rashes or lesions   Lymph nodes:   Cervical, supraclavicular, and axillary nodes normal   Neurologic:   Non-focal        Assessment:       See below by issue      Plan:    1. CFS/post infectious FMS-pt stable on current meds. Keppra has not helped to eliminate percocet and thus we are weaning off. Pt aware that exercise is key. As noted above cymbalta and lorica have not helped. Pt notes the stable regimen on clonazepam and provigil have helped to handle the muscle pains and fatigue. Pt notes percocet helps the pain despite discussing the lack of evidence to support. Of note pt has not asked for any dose adjustments in several years. Pt reports no issues with work or driving. We have discovered a worsening sleep pattern and will be evaluating for sleep apnea given the high score on the sleep scale survey. Pt has been called to pick up home sleep machine and the  study has been approved by insurance. No change in meds except chronically asking pt to try and increase exercise and D/C percocet. Note clonazepam was not started by me and pt feels it helps.Provigil is essential for both CFS and counter balancing any sedation effects from other meds.    2. Endo-pt with chronic hx since mengioma surgery of both hypogonadism and hypothyroidism-pt stabe on current replacement for both. Noted mildly elevated glu-discussed tighter control of carb consumption. Discussed consultation with Dr. Sherian Rein who does testosterone pellet therapy-pt aware insurance will likely not cover. Pt has struggled with varying levels and dosing types of testosterone. Pt's TSH noted and will recheck full TFTs in 4 to 6 weeks.     3. Pulm-stable on current meds-no changes.    4. BPH-currently well controlled.    5. HTN-currently well controlled    6. Lipids-checking annual lipids-prior issue with statins. On zetia without issues.    7. TG- cont. tricor    8. Dexa-given hx of hypogonadism checking dexa scan for osteoporosis.    9. Prevnar 13  recommended    10. Shingles vaccine this visit.    11. Renal-borderline renal function. BP controlled. Recheck in 3 to 4  Months. Renal referal if further deterioration.     12. All meds refilled and restarted all scripts at this visit. Pt has struggled with his script management and new insurance coverage.   Procedures

## 2014-02-13 NOTE — Telephone Encounter (Signed)
After a peer-to-peer review, prior authorization was obtained for the take-home sleep study. The authorization lasts from June 25th to August 25th, 2015, and the approval number is [id number]. I spoke with Erie Noe from [facility].

## 2014-02-14 LAB — COMPREHENSIVE METABOLIC PANEL
ALT: 22 IU/L (ref 0–44)
AST (SGOT): 21 IU/L (ref 0–40)
Albumin/Globulin Ratio: 2.6 — ABNORMAL HIGH (ref 1.1–2.5)
Albumin: 4.7 g/dL (ref 3.6–4.8)
Alkaline Phosphatase: 32 IU/L — ABNORMAL LOW (ref 39–117)
BUN / Creatinine Ratio: 21 (ref 10–22)
BUN: 31 mg/dL — ABNORMAL HIGH (ref 8–27)
Bilirubin, Total: 0.4 mg/dL (ref 0.0–1.2)
CO2: 25 mmol/L (ref 18–29)
Calcium: 10.1 mg/dL (ref 8.6–10.2)
Chloride: 99 mmol/L (ref 97–108)
Creatinine: 1.48 mg/dL — ABNORMAL HIGH (ref 0.76–1.27)
EGFR: 49 mL/min/{1.73_m2} — ABNORMAL LOW (ref >59)
EGFR: 57 mL/min/{1.73_m2} — ABNORMAL LOW (ref >59)
Globulin, Total: 1.8 g/dL (ref 1.5–4.5)
Glucose: 102 mg/dL — ABNORMAL HIGH (ref 65–99)
Potassium: 5.2 mmol/L (ref 3.5–5.2)
Protein, Total: 6.5 g/dL (ref 6.0–8.5)
Sodium: 140 mmol/L (ref 134–144)

## 2014-02-14 LAB — CBC AND DIFFERENTIAL
Baso(Absolute): 0.1 10*3/uL (ref 0.0–0.2)
Basos: 1 %
Eos: 4 %
Eosinophils Absolute: 0.2 10*3/uL (ref 0.0–0.4)
Hematocrit: 38 % (ref 37.5–51.0)
Hemoglobin: 12.4 g/dL — ABNORMAL LOW (ref 12.6–17.7)
Immature Granulocytes Absolute: 0 10*3/uL (ref 0.0–0.1)
Immature Granulocytes: 0 %
Lymphocytes Absolute: 2.4 10*3/uL (ref 0.7–3.1)
Lymphocytes: 37 %
MCH: 29.6 pg (ref 26.6–33.0)
MCHC: 32.6 g/dL (ref 31.5–35.7)
MCV: 91 fL (ref 79–97)
Monocytes Absolute: 0.6 10*3/uL (ref 0.1–0.9)
Monocytes: 10 %
Neutrophils Absolute: 3.2 10*3/uL (ref 1.4–7.0)
Neutrophils: 48 %
Platelets: 286 10*3/uL (ref 150–379)
RBC: 4.19 x10E6/uL (ref 4.14–5.80)
RDW: 13.3 % (ref 12.3–15.4)
WBC: 6.6 10*3/uL (ref 3.4–10.8)

## 2014-02-14 LAB — LIPID PANEL
Cholesterol / HDL Ratio: 3 ratio units (ref 0.0–5.0)
Cholesterol: 194 mg/dL (ref 100–199)
HDL: 65 mg/dL (ref >39)
LDL Calculated: 107 mg/dL — ABNORMAL HIGH (ref 0–99)
Triglycerides: 109 mg/dL (ref 0–149)
VLDL Calculated: 22 mg/dL (ref 5–40)

## 2014-02-14 LAB — PSA: Prostate Specific Antigen, Total: 0.1 ng/mL (ref 0.0–4.0)

## 2014-02-14 LAB — VITAMIN D,25 OH,TOTAL: Vitamin D 25-Hydroxy: 36 ng/mL (ref 30.0–100.0)

## 2014-02-14 LAB — HEPATIC FUNCTION PANEL: Bilirubin Direct: 0.14 mg/dL (ref 0.00–0.40)

## 2014-02-14 LAB — TSH: TSH: 0.193 u[IU]/mL — ABNORMAL LOW (ref 0.450–4.500)

## 2014-02-14 LAB — C-REACTIVE PROTEIN: C-Reactive Protein: 0.4 mg/L (ref 0.0–4.9)

## 2014-02-16 LAB — TESTOSTERONE, FREE, TOTAL
Testosterone Total MS: 280.9 ng/dL — ABNORMAL LOW (ref 348.0–1197.0)
Testosterone, Free: 2.1 pg/mL — ABNORMAL LOW (ref 6.6–18.1)

## 2014-02-17 ENCOUNTER — Encounter: Payer: Self-pay | Admitting: Internal Medicine

## 2014-02-18 ENCOUNTER — Telehealth: Payer: Self-pay | Admitting: Internal Medicine

## 2014-02-18 NOTE — Telephone Encounter (Signed)
I confirmed with Bonita Quin' at [facility] that pt is covered at participating pharmacies for the Zostavax, Ref# [id number].(JXB previous message that he is covered at physician office as well)

## 2014-02-25 ENCOUNTER — Encounter: Payer: Self-pay | Admitting: Internal Medicine

## 2014-02-27 ENCOUNTER — Other Ambulatory Visit: Payer: Self-pay | Admitting: Internal Medicine

## 2014-03-06 ENCOUNTER — Other Ambulatory Visit: Payer: Self-pay

## 2014-03-06 NOTE — Telephone Encounter (Signed)
-----   Message from Nida Boatman sent at 03/06/2014 12:45 PM EDT -----  Regarding: FW: Prior Auth Required  Contact: [phone]      ----- Message -----     From: Nida Boatman     Sent: 03/06/2014  12:24 PM       To: Jason Fila, MD  Subject: Prior Auth Required                              Dr. Arita Miss,    One of Dr. Renaye Rakers patients requires a prior-auth for the below rx as he is running low. This needs to be called into [phone]. Since you are the on-call physician, can you please take care of this when you get a chance today.    modafinil (PROVIGIL) 100 MG tablet (3x daily)    Patient would also like an update - please call him at [phone].    Thanks,  [employer]

## 2014-03-17 ENCOUNTER — Encounter: Payer: Self-pay | Admitting: Internal Medicine

## 2014-03-20 ENCOUNTER — Encounter: Payer: Self-pay | Admitting: Internal Medicine

## 2014-03-21 ENCOUNTER — Other Ambulatory Visit: Payer: Self-pay

## 2014-03-21 ENCOUNTER — Ambulatory Visit: Payer: BC Managed Care – PPO

## 2014-03-21 DIAGNOSIS — G933 Postviral fatigue syndrome: Secondary | ICD-10-CM

## 2014-03-21 DIAGNOSIS — M797 Fibromyalgia: Secondary | ICD-10-CM

## 2014-03-21 DIAGNOSIS — G9332 Myalgic encephalomyelitis/chronic fatigue syndrome: Secondary | ICD-10-CM

## 2014-03-21 DIAGNOSIS — R5382 Chronic fatigue, unspecified: Secondary | ICD-10-CM

## 2014-03-21 DIAGNOSIS — G9331 Postviral fatigue syndrome: Secondary | ICD-10-CM

## 2014-03-21 MED ORDER — MODAFINIL 100 MG PO TABS
300.0000 mg | ORAL_TABLET | Freq: Every day | ORAL | Status: DC
Start: 2014-03-21 — End: 2014-09-09

## 2014-03-24 ENCOUNTER — Encounter: Payer: Self-pay | Admitting: Internal Medicine

## 2014-03-24 ENCOUNTER — Telehealth: Payer: Self-pay

## 2014-03-24 NOTE — Telephone Encounter (Signed)
[name],  Your sleep study report is attached. While definitely not severe I want you to set up an appointment to see [facility]. Given the underlying fatigue issues you are one person I would love to see how an autotitrating CPAP machine would effect. It could be that boost of energy we are looking for.     I also want you to read and explore adding NADH supplement to you regimen. I would suggest starting it at 5mg  and then going up to 10mg  a day if no benefit.    Carly can help you to see Hanley Seamen. Please let me know when this appt is so I can speak to him ahead of time.  Thanks  [name]    From: Amous, Neda M.   Sent: Monday, March 24, 2014 7:55 AM  To: [name].  Subject: Sleep Study Report

## 2014-03-24 NOTE — Telephone Encounter (Signed)
Carly spoke with patient and patient states that he will make his appointment with Dr. Janan Ridge and declines assistance at this time.

## 2014-03-25 ENCOUNTER — Encounter: Payer: Self-pay | Admitting: Internal Medicine

## 2014-04-01 ENCOUNTER — Encounter: Payer: Self-pay | Admitting: Internal Medicine

## 2014-04-08 ENCOUNTER — Ambulatory Visit: Payer: BC Managed Care – PPO | Attending: Internal Medicine

## 2014-04-08 DIAGNOSIS — E039 Hypothyroidism, unspecified: Secondary | ICD-10-CM

## 2014-04-08 DIAGNOSIS — E291 Testicular hypofunction: Secondary | ICD-10-CM

## 2014-04-08 NOTE — Progress Notes (Signed)
Labs drawn from right anticubital on first attempt using aseptic technique, pressure applied to site with no bleeding noted, patient tol well.

## 2014-04-09 ENCOUNTER — Encounter: Payer: Self-pay | Admitting: Internal Medicine

## 2014-04-09 LAB — TSH: TSH: 0.959 u[IU]/mL (ref 0.450–4.500)

## 2014-04-09 LAB — TESTOSTERONE: Testosterone: 550 ng/dL (ref 348–1197)

## 2014-04-09 LAB — T4, FREE: T4, Free: 1.69 ng/dL (ref 0.82–1.77)

## 2014-04-09 LAB — T3, FREE: T3, Free: 2.7 pg/mL (ref 2.0–4.4)

## 2014-04-15 LAB — TESTOSTERONE TOTAL, BIOAVAILABLE AND FREE
% Free Testosterone (Dialysis): 1.6 %
Bioavailable Testosterone, PCT: 37.4 %
TESTOSTERONE, FREE, S: 91 pg/mL
Testosterone Bioavailable: 213 ng/dL
Testosterone, Total: 569 ng/dL

## 2014-05-29 ENCOUNTER — Other Ambulatory Visit: Payer: Self-pay | Admitting: Internal Medicine

## 2014-05-29 DIAGNOSIS — R5382 Chronic fatigue, unspecified: Secondary | ICD-10-CM

## 2014-05-29 DIAGNOSIS — G9332 Myalgic encephalomyelitis/chronic fatigue syndrome: Secondary | ICD-10-CM

## 2014-05-29 DIAGNOSIS — M797 Fibromyalgia: Secondary | ICD-10-CM

## 2014-05-29 MED ORDER — OXYCODONE-ACETAMINOPHEN 5-325 MG PO TABS
ORAL_TABLET | ORAL | Status: DC
Start: 2014-05-29 — End: 2014-05-29

## 2014-05-29 MED ORDER — CLONAZEPAM 0.5 MG PO TABS
0.5000 mg | ORAL_TABLET | Freq: Three times a day (TID) | ORAL | Status: DC | PRN
Start: 2014-05-29 — End: 2014-05-30

## 2014-05-29 MED ORDER — TRAMADOL HCL 50 MG PO TABS
50.0000 mg | ORAL_TABLET | Freq: Four times a day (QID) | ORAL | Status: DC | PRN
Start: 2014-05-29 — End: 2014-08-03

## 2014-05-29 MED ORDER — OXYCODONE-ACETAMINOPHEN 5-325 MG PO TABS
ORAL_TABLET | ORAL | Status: DC
Start: 2014-05-29 — End: 2014-07-31

## 2014-05-29 NOTE — Progress Notes (Signed)
Percocet, clonazepam, and tramadol refilled. No side effects. Pt unable to wean. All meds chronic and stable. Pt planned retirement which will hopefully help sxs.

## 2014-05-30 ENCOUNTER — Other Ambulatory Visit: Payer: Self-pay | Admitting: Internal Medicine

## 2014-05-30 DIAGNOSIS — M797 Fibromyalgia: Secondary | ICD-10-CM

## 2014-05-30 DIAGNOSIS — G9332 Myalgic encephalomyelitis/chronic fatigue syndrome: Secondary | ICD-10-CM

## 2014-05-30 DIAGNOSIS — R5382 Chronic fatigue, unspecified: Secondary | ICD-10-CM

## 2014-05-30 MED ORDER — CLONAZEPAM 0.5 MG PO TABS
0.5000 mg | ORAL_TABLET | Freq: Three times a day (TID) | ORAL | Status: DC | PRN
Start: 2014-05-30 — End: 2014-05-30

## 2014-05-30 MED ORDER — CLONAZEPAM 0.5 MG PO TABS
0.5000 mg | ORAL_TABLET | Freq: Three times a day (TID) | ORAL | Status: DC | PRN
Start: 2014-05-30 — End: 2015-01-23

## 2014-06-03 ENCOUNTER — Other Ambulatory Visit: Payer: Self-pay | Admitting: Family Medicine

## 2014-06-04 ENCOUNTER — Ambulatory Visit: Payer: BC Managed Care – PPO | Attending: Internal Medicine

## 2014-06-04 DIAGNOSIS — Z006 Encounter for examination for normal comparison and control in clinical research program: Secondary | ICD-10-CM

## 2014-06-04 NOTE — Progress Notes (Signed)
Labs drawn from right anticubital on first attempt using aseptic technique, pressure applied to site with no bleeding noted, patient tol well. Patient MR# recorded on tube and research only labs shipped per company instructions.

## 2014-06-18 ENCOUNTER — Telehealth: Payer: Self-pay | Admitting: Internal Medicine

## 2014-06-18 ENCOUNTER — Other Ambulatory Visit: Payer: Self-pay

## 2014-06-18 DIAGNOSIS — G9332 Myalgic encephalomyelitis/chronic fatigue syndrome: Secondary | ICD-10-CM

## 2014-06-18 DIAGNOSIS — R5382 Chronic fatigue, unspecified: Secondary | ICD-10-CM

## 2014-06-18 NOTE — Telephone Encounter (Signed)
Based on experimental lab assessment of bacteria and viral DNA noted in pt's blood rec was to test for toxo. Pt agrees to see if this may be a cause linked to his CFS.

## 2014-06-21 ENCOUNTER — Other Ambulatory Visit: Payer: Self-pay | Admitting: Family Medicine

## 2014-06-25 ENCOUNTER — Ambulatory Visit: Payer: BC Managed Care – PPO | Attending: Internal Medicine

## 2014-06-25 DIAGNOSIS — R5382 Chronic fatigue, unspecified: Secondary | ICD-10-CM | POA: Insufficient documentation

## 2014-06-25 DIAGNOSIS — G9332 Myalgic encephalomyelitis/chronic fatigue syndrome: Secondary | ICD-10-CM

## 2014-06-25 DIAGNOSIS — Z23 Encounter for immunization: Secondary | ICD-10-CM

## 2014-06-25 NOTE — Progress Notes (Signed)
Labs drawn from right anticubital on first attempt using aseptic technique, pressure applied to site with no bleeding noted, patient tol well. Shingles vaccine administered as documented in Immunizations.

## 2014-06-25 NOTE — Addendum Note (Signed)
Addended by: Aura Camps on: 06/25/2014 02:47 PM     Modules accepted: Orders

## 2014-06-26 LAB — TOXOPLASMA GONDII DNA, QUAN, PCR: T. GONDII DNA, QN PCR: <100 (ref <100)

## 2014-06-26 LAB — TOXOPLASMA ABS, IGG/IGM, SERUM
Toxoplasma Gondii, IgG: <3 IU/mL (ref 0.0–7.1)
Toxoplasma gondii Ab,IgM,Qn: <3 AU/mL (ref 0.0–7.9)

## 2014-07-31 ENCOUNTER — Encounter: Payer: Self-pay | Admitting: Internal Medicine

## 2014-07-31 ENCOUNTER — Other Ambulatory Visit: Payer: Self-pay | Admitting: Internal Medicine

## 2014-07-31 DIAGNOSIS — M797 Fibromyalgia: Secondary | ICD-10-CM

## 2014-07-31 MED ORDER — OXYCODONE-ACETAMINOPHEN 5-325 MG PO TABS
ORAL_TABLET | ORAL | Status: DC
Start: 2014-07-31 — End: 2014-07-31

## 2014-07-31 MED ORDER — OXYCODONE-ACETAMINOPHEN 5-325 MG PO TABS
ORAL_TABLET | ORAL | Status: DC
Start: 2014-07-31 — End: 2014-09-24

## 2014-07-31 NOTE — Telephone Encounter (Signed)
Chronic stable percocet dose refill. Pt psychologically unable to wean. No side effects. No work related issues. Pt reports having tried multiple times to wean, but FM pain is too much when he does. Exercise and other measures does not help. Pt decreasing work duties and retiring soon.

## 2014-08-03 ENCOUNTER — Encounter: Payer: Self-pay | Admitting: Internal Medicine

## 2014-08-03 DIAGNOSIS — M797 Fibromyalgia: Secondary | ICD-10-CM

## 2014-08-03 MED ORDER — TRAMADOL HCL 50 MG PO TABS
50.0000 mg | ORAL_TABLET | Freq: Four times a day (QID) | ORAL | Status: DC | PRN
Start: 2014-08-03 — End: 2015-04-01

## 2014-08-04 ENCOUNTER — Telehealth: Payer: Self-pay

## 2014-08-04 NOTE — Telephone Encounter (Signed)
Ultram prescription called in to [facility] at 07:30.  Dr. [name] notified.

## 2014-09-09 ENCOUNTER — Other Ambulatory Visit: Payer: Self-pay | Admitting: Internal Medicine

## 2014-09-09 DIAGNOSIS — G933 Postviral fatigue syndrome: Secondary | ICD-10-CM

## 2014-09-09 DIAGNOSIS — E039 Hypothyroidism, unspecified: Secondary | ICD-10-CM

## 2014-09-09 DIAGNOSIS — G9331 Postviral fatigue syndrome: Secondary | ICD-10-CM

## 2014-09-09 DIAGNOSIS — E781 Pure hyperglyceridemia: Secondary | ICD-10-CM

## 2014-09-09 DIAGNOSIS — J4540 Moderate persistent asthma, uncomplicated: Secondary | ICD-10-CM

## 2014-09-09 DIAGNOSIS — E782 Mixed hyperlipidemia: Secondary | ICD-10-CM

## 2014-09-09 DIAGNOSIS — E291 Testicular hypofunction: Secondary | ICD-10-CM

## 2014-09-09 DIAGNOSIS — I10 Essential (primary) hypertension: Secondary | ICD-10-CM

## 2014-09-09 DIAGNOSIS — F32A Depression, unspecified: Secondary | ICD-10-CM

## 2014-09-09 MED ORDER — AMLODIPINE BESYLATE 5 MG PO TABS
5.0000 mg | ORAL_TABLET | Freq: Every day | ORAL | Status: DC
Start: 2014-09-09 — End: 2015-11-05

## 2014-09-09 MED ORDER — MONTELUKAST SODIUM 10 MG PO TABS
10.0000 mg | ORAL_TABLET | Freq: Every evening | ORAL | Status: DC
Start: 2014-09-09 — End: 2015-11-05

## 2014-09-09 MED ORDER — ALBUTEROL SULFATE HFA 108 (90 BASE) MCG/ACT IN AERS
1.0000 | INHALATION_SPRAY | Freq: Four times a day (QID) | RESPIRATORY_TRACT | Status: DC | PRN
Start: 2014-09-09 — End: 2015-07-21

## 2014-09-09 MED ORDER — RAMIPRIL 10 MG PO CAPS
20.0000 mg | ORAL_CAPSULE | Freq: Every day | ORAL | Status: DC
Start: 2014-09-09 — End: 2015-11-05

## 2014-09-09 MED ORDER — TESTOSTERONE 50 MG/5GM (1%) TD GEL
2.5000 g | Freq: Every day | TRANSDERMAL | Status: DC
Start: 2014-09-09 — End: 2014-09-24

## 2014-09-09 MED ORDER — DULOXETINE HCL 60 MG PO CPEP
60.0000 mg | ORAL_CAPSULE | Freq: Every day | ORAL | Status: DC
Start: 2014-09-09 — End: 2015-11-05

## 2014-09-09 MED ORDER — FLUTICASONE-SALMETEROL 100-50 MCG/DOSE IN AEPB
1.0000 | INHALATION_SPRAY | Freq: Two times a day (BID) | RESPIRATORY_TRACT | Status: AC
Start: 2014-09-09 — End: 2015-09-09

## 2014-09-09 MED ORDER — EZETIMIBE 10 MG PO TABS
10.0000 mg | ORAL_TABLET | Freq: Every day | ORAL | Status: DC
Start: 2014-09-09 — End: 2015-09-10

## 2014-09-09 MED ORDER — LEVOTHYROXINE SODIUM 200 MCG PO TABS
200.0000 ug | ORAL_TABLET | Freq: Every day | ORAL | Status: DC
Start: 2014-09-09 — End: 2015-09-15

## 2014-09-09 MED ORDER — MODAFINIL 100 MG PO TABS
300.0000 mg | ORAL_TABLET | Freq: Every day | ORAL | Status: DC
Start: 2014-09-09 — End: 2014-09-24

## 2014-09-09 MED ORDER — FENOFIBRATE 145 MG PO TABS
145.0000 mg | ORAL_TABLET | Freq: Every day | ORAL | Status: DC
Start: 2014-09-09 — End: 2015-11-05

## 2014-09-09 NOTE — Telephone Encounter (Signed)
Meds refilled. ALl chronic stable doses with no side effects. Change to OTC flonase from Nasacort. Provigil and testosterone must be called in or paper scripts.

## 2014-09-24 ENCOUNTER — Encounter: Payer: Self-pay | Admitting: Internal Medicine

## 2014-09-24 ENCOUNTER — Telehealth: Payer: Self-pay | Admitting: Internal Medicine

## 2014-09-24 DIAGNOSIS — G9331 Postviral fatigue syndrome: Secondary | ICD-10-CM

## 2014-09-24 DIAGNOSIS — E291 Testicular hypofunction: Secondary | ICD-10-CM

## 2014-09-24 DIAGNOSIS — G933 Postviral fatigue syndrome: Secondary | ICD-10-CM

## 2014-09-24 DIAGNOSIS — M797 Fibromyalgia: Secondary | ICD-10-CM

## 2014-09-24 MED ORDER — MODAFINIL 100 MG PO TABS
300.0000 mg | ORAL_TABLET | Freq: Every day | ORAL | Status: DC
Start: 2014-09-24 — End: 2015-03-15

## 2014-09-24 MED ORDER — OXYCODONE-ACETAMINOPHEN 5-325 MG PO TABS
ORAL_TABLET | ORAL | Status: DC
Start: 2014-09-24 — End: 2014-09-24

## 2014-09-24 MED ORDER — TESTOSTERONE 12.5 MG/ACT (1%) TD GEL
2.0000 | Freq: Every day | TRANSDERMAL | Status: DC
Start: 2014-09-24 — End: 2015-03-19

## 2014-09-24 MED ORDER — OXYCODONE-ACETAMINOPHEN 5-325 MG PO TABS
ORAL_TABLET | ORAL | Status: DC
Start: 2014-09-24 — End: 2014-12-18

## 2014-09-24 NOTE — Telephone Encounter (Signed)
Refills.Chronic meds.No issues or side effects per pt. No dose changes.

## 2014-09-25 ENCOUNTER — Telehealth: Payer: Self-pay

## 2014-09-25 NOTE — Telephone Encounter (Signed)
Called for pre-authorization for Testosterone (Androgel) and Modafinil (Provigil). Insurance to fax authorization forms to fill out and fax back.

## 2014-09-25 NOTE — Telephone Encounter (Signed)
Faxed required documentation for pre authorization for Modafinil and Testosterone.

## 2014-11-24 ENCOUNTER — Encounter: Payer: Self-pay | Admitting: Internal Medicine

## 2014-12-18 ENCOUNTER — Encounter: Payer: Self-pay | Admitting: Internal Medicine

## 2014-12-18 ENCOUNTER — Other Ambulatory Visit: Payer: Self-pay | Admitting: Internal Medicine

## 2014-12-18 DIAGNOSIS — M797 Fibromyalgia: Secondary | ICD-10-CM

## 2014-12-18 MED ORDER — OXYCODONE-ACETAMINOPHEN 5-325 MG PO TABS
ORAL_TABLET | ORAL | Status: DC
Start: 2014-12-18 — End: 2015-03-12

## 2014-12-18 MED ORDER — OXYCODONE-ACETAMINOPHEN 5-325 MG PO TABS
ORAL_TABLET | ORAL | Status: DC
Start: 2014-12-18 — End: 2014-12-18

## 2014-12-18 NOTE — Telephone Encounter (Signed)
Pt's 3 months chronic percocet reordered. No side effects. No work impact per pt. Multiple orders above due to printer issues. Three scripts 30 days each provided only. Accounting of all scripts and pill counts done 3 months ago and accurrate. Pharmacy state database reviewed this year prior and no other MDS ordering these scripts for pt. Pt reports benefit and inability to wean. We will see if we can once he retires.

## 2015-01-19 ENCOUNTER — Other Ambulatory Visit: Payer: Self-pay | Admitting: Internal Medicine

## 2015-01-23 ENCOUNTER — Other Ambulatory Visit: Payer: Self-pay

## 2015-01-23 DIAGNOSIS — G9332 Myalgic encephalomyelitis/chronic fatigue syndrome: Secondary | ICD-10-CM

## 2015-01-23 DIAGNOSIS — R5382 Chronic fatigue, unspecified: Secondary | ICD-10-CM

## 2015-01-23 DIAGNOSIS — M797 Fibromyalgia: Secondary | ICD-10-CM

## 2015-01-23 MED ORDER — CLONAZEPAM 0.5 MG PO TABS
0.5000 mg | ORAL_TABLET | Freq: Three times a day (TID) | ORAL | Status: DC | PRN
Start: 2015-01-23 — End: 2015-04-16

## 2015-03-02 ENCOUNTER — Encounter: Payer: Self-pay | Admitting: Internal Medicine

## 2015-03-02 ENCOUNTER — Telehealth: Payer: Self-pay

## 2015-03-02 DIAGNOSIS — J45901 Unspecified asthma with (acute) exacerbation: Secondary | ICD-10-CM

## 2015-03-02 MED ORDER — PREDNISONE 10 MG PO TABS
10.0000 mg | ORAL_TABLET | Freq: Every day | ORAL | Status: AC
Start: 2015-03-02 — End: 2015-03-14

## 2015-03-02 NOTE — Telephone Encounter (Signed)
-----  Original Message-----  From: [name]   Sent: Monday, March 02, 2015 8:08 AM  To: [name]  Cc: Randolph Bing  Subject: Re: prednisone    Done. If no better I should see you.  [name]   (Scan)    Coralyn Mark, MD, [facility]  Clinical Professor of Medicine  Medical Director [place] [facility]    On Mar 02, 2015, at 8:01 AM, [name] [email or web address]> wrote:     Shannon [name],    Trouble with asthma, first time in years.  Johnston Ebbs, my pulmonologist has retired.  Best for me - prednisone starting at 60 mg.  Decrease by 10 mg every two days.  Medrol Pak does not get it.  No productive cough, so don't need an antibiotic.  Could you please phone that into [facility]?  [phone].  Thanks,     Jeffey

## 2015-03-12 ENCOUNTER — Encounter: Payer: Self-pay | Admitting: Internal Medicine

## 2015-03-12 ENCOUNTER — Telehealth: Payer: Self-pay | Admitting: Internal Medicine

## 2015-03-12 DIAGNOSIS — M797 Fibromyalgia: Secondary | ICD-10-CM

## 2015-03-12 MED ORDER — OXYCODONE-ACETAMINOPHEN 5-325 MG PO TABS
ORAL_TABLET | ORAL | Status: DC
Start: 2015-03-12 — End: 2015-03-12

## 2015-03-12 MED ORDER — OXYCODONE-ACETAMINOPHEN 5-325 MG PO TABS
ORAL_TABLET | ORAL | Status: DC
Start: 2015-03-12 — End: 2015-06-25

## 2015-03-12 NOTE — Telephone Encounter (Signed)
Chronic narcotic refill. State database checked 3 months ago and no other scripts by any other providers. I have counselled the pt who is likely psychologically dependent. No signs of physical dependency. Will continue to try and wean pt off. Pt refuses to go to pain management specialist in the past. Hoping pt's retirement will allow [place] to wean.

## 2015-03-15 ENCOUNTER — Other Ambulatory Visit: Payer: Self-pay | Admitting: Internal Medicine

## 2015-03-15 DIAGNOSIS — R400 Somnolence: Secondary | ICD-10-CM

## 2015-03-15 DIAGNOSIS — G933 Postviral fatigue syndrome: Secondary | ICD-10-CM

## 2015-03-15 DIAGNOSIS — G9331 Postviral fatigue syndrome: Secondary | ICD-10-CM

## 2015-03-16 ENCOUNTER — Other Ambulatory Visit: Payer: Self-pay | Admitting: Family Medicine

## 2015-03-16 ENCOUNTER — Telehealth: Payer: Self-pay | Admitting: Internal Medicine

## 2015-03-16 DIAGNOSIS — M797 Fibromyalgia: Secondary | ICD-10-CM

## 2015-03-16 DIAGNOSIS — G933 Postviral fatigue syndrome: Secondary | ICD-10-CM

## 2015-03-16 DIAGNOSIS — R400 Somnolence: Secondary | ICD-10-CM

## 2015-03-16 DIAGNOSIS — G9331 Postviral fatigue syndrome: Secondary | ICD-10-CM

## 2015-03-16 MED ORDER — MODAFINIL 100 MG PO TABS
100.0000 mg | ORAL_TABLET | Freq: Three times a day (TID) | ORAL | Status: DC
Start: 2015-03-16 — End: 2015-05-06

## 2015-03-16 NOTE — Telephone Encounter (Signed)
Rx for provigil was mailed out to the patient for filing with [employer].

## 2015-03-19 ENCOUNTER — Other Ambulatory Visit: Payer: Self-pay | Admitting: Family Medicine

## 2015-03-19 DIAGNOSIS — E291 Testicular hypofunction: Secondary | ICD-10-CM

## 2015-03-19 MED ORDER — TESTOSTERONE 50 MG/5GM (1%) TD GEL
TRANSDERMAL | Status: DC
Start: 2015-03-19 — End: 2016-02-01

## 2015-04-01 ENCOUNTER — Telehealth: Payer: Self-pay

## 2015-04-01 DIAGNOSIS — M797 Fibromyalgia: Secondary | ICD-10-CM

## 2015-04-01 MED ORDER — TRAMADOL HCL 50 MG PO TABS
50.0000 mg | ORAL_TABLET | Freq: Every day | ORAL | Status: AC
Start: 2015-04-01 — End: 2016-03-31

## 2015-04-01 NOTE — Telephone Encounter (Signed)
-----  Original Message-----  From: [name]   Sent: Wednesday, April 01, 2015 1:00 PM  To: [name]  Cc: Randolph Bing  Subject: RE: stuff    Victorino Dike,  Can you call in for Shaman  Tramadol ER 50mg   One tab po daily  Disp #30 and 1 refill  THx  [name]    -----Original Message-----  From: [name] [[email or web address]]   Sent: Wednesday, April 01, 2015 12:14 PM  To: [name].  Subject: stuff    Hi Craig,    The unpleasantness in the office has heightened my stress levels and increased the pain.  I am going to run out of Percocet.  Could you please phone in 30 of Tramadol 60 mg?  [facility] [phone]     Dayquan

## 2015-04-01 NOTE — Telephone Encounter (Signed)
Medication called in to pharmacy.

## 2015-04-16 ENCOUNTER — Telehealth: Payer: Self-pay

## 2015-04-16 DIAGNOSIS — R5382 Chronic fatigue, unspecified: Secondary | ICD-10-CM

## 2015-04-16 DIAGNOSIS — M797 Fibromyalgia: Secondary | ICD-10-CM

## 2015-04-16 DIAGNOSIS — G9332 Myalgic encephalomyelitis/chronic fatigue syndrome: Secondary | ICD-10-CM

## 2015-04-16 MED ORDER — CLONAZEPAM 0.5 MG PO TABS
0.5000 mg | ORAL_TABLET | Freq: Three times a day (TID) | ORAL | Status: DC | PRN
Start: 2015-04-16 — End: 2015-06-25

## 2015-04-16 NOTE — Telephone Encounter (Signed)
From: [name]   Sent: Thursday, April 16, 2015 8:55 AM  To: Randolph Bing  Subject: Fwd: Clonazepam    Can you refill the below as requested?  [name]     From: [name] [[email or web address]]  Sent: April 15, 2015 at 5:17:12 PM   To: [name].   Subject: Clonazepam   Hi [name],    Could I please get refills?  0.5 mg - TID, # 90 with a refill.  Think it can be phoned in to [facility] [phone].  Thanks,    Shannon [name]

## 2015-04-16 NOTE — Telephone Encounter (Signed)
From: Randolph Bing   Sent: Thursday, April 16, 2015 12:19 PM  To: '[email or web address]'  Cc: [name].  Subject: [place] [facility] - Refill     Dr. [name],     Your medication has been called into the [facility] pharmacy.     Let me know if you need any further assistance,     Please note that I am out of the office on Fridays.    If you need immediate assistance, please call the office or email [email or web address] .Tania Ade, RN  Lead Nurse      [place] [facility] [address], [address], [place], [place] [address]  T [phone]  [name] [phone]           This communication may contain confidential and/or privileged information. Additionally, this communication may contain protected health information (PHI) that is legally protected from inappropriate disclosure by the Privacy Standards of the [employer] Portability and Accountability Act (HIPAA) and relevant [employer]. If you are not the intended recipient, please note that any dissemination, distribution or copying of this communication is strictly prohibited. If you have received this message in error, you should notify the sender immediately by telephone or by return e-mail and delete this message from your computer. Direct questions to the Personal assistant at [phone].

## 2015-05-06 ENCOUNTER — Other Ambulatory Visit: Payer: Self-pay

## 2015-05-06 DIAGNOSIS — G9331 Postviral fatigue syndrome: Secondary | ICD-10-CM

## 2015-05-06 DIAGNOSIS — R400 Somnolence: Secondary | ICD-10-CM

## 2015-05-06 DIAGNOSIS — G933 Postviral fatigue syndrome: Secondary | ICD-10-CM

## 2015-05-06 MED ORDER — FINASTERIDE 5 MG PO TABS
5.0000 mg | ORAL_TABLET | Freq: Every day | ORAL | Status: DC
Start: 2015-05-06 — End: 2015-06-25

## 2015-05-06 MED ORDER — MODAFINIL 100 MG PO TABS
100.0000 mg | ORAL_TABLET | Freq: Three times a day (TID) | ORAL | Status: DC
Start: 2015-05-06 — End: 2015-06-25

## 2015-05-06 NOTE — Telephone Encounter (Signed)
From: Randolph Bing   Sent: Wednesday, May 06, 2015 4:26 PM  To: [name].; [email or web address]; Charlton Haws, Sharlett Iles.; Dileonardo, Emilie T.; Sheppard Coil  Subject: RE: stuff    Dr. [name],     I have sent your refill for the Finasteride to the [facility] pharmacy on file.     Also, your prescription for the Modafinil has been printed and will be available for pick up the [place] office tomorrow between the hours of 7:00am - 3:30pm.  Please note they close at 3:30pm.      [facility] - [address] Office   [address]   [address]  [place], [place] [address]    Please let me know if you need further assistance,      Renaldo Fiddler, RN  Lead Nurse      [place] [facility] [address], [place], [place] [address]  T [phone]  [name] [phone]           This communication may contain confidential and/or privileged information. Additionally, this communication may contain protected health information (PHI) that is legally protected from inappropriate disclosure by the Privacy Standards of the [employer] Portability and Accountability Act (HIPAA) and relevant [employer]. If you are not the intended recipient, please note that any dissemination, distribution or copying of this communication is strictly prohibited. If you have received this message in error, you should notify the sender immediately by telephone or by return e-mail and delete this message from your computer. Direct questions to the Personal assistant at [phone].

## 2015-05-06 NOTE — Telephone Encounter (Signed)
From: [name]   Sent: Wednesday, May 06, 2015 4:14 PM  To: [email or web address]  Cc: Randolph Bing; Sharon, Renae Fickle.; Mullins, Rosemarie Beath.  Subject: Re: stuff    I will ask the team to call in. I will be at [facility] to write the mods final tomorrow by noon. The letter is awaiting my signature as it was printed with the wrong spacing and had two fonts. I can't sign it until Thursday afternoon when back over there as I was traveling today.  [name]       From: [name] [[email or web address]]  Sent: May 06, 2015 at 12:09:51 PM   To: [name].   Subject: stuff   Hi [name],    Could you please phone it into [facility] [phone] - Finasteride 5 mg, #30 with refills, 1 po QD.  I am running out of Modafinil 100 mg, 3 po QD, #90 with one refill.  This was filled while you were gone without a refill.  I can pick that up at [facility] since it can not be phoned in, no rush.    I did not receive the disability letter yesterday.  Was I supposed to pick it up at [facility]?    Thanks,    Jeanette

## 2015-05-07 ENCOUNTER — Other Ambulatory Visit: Payer: Self-pay

## 2015-05-08 ENCOUNTER — Encounter: Payer: Self-pay | Admitting: Internal Medicine

## 2015-06-02 ENCOUNTER — Other Ambulatory Visit: Payer: Self-pay | Admitting: Internal Medicine

## 2015-06-02 DIAGNOSIS — E785 Hyperlipidemia, unspecified: Secondary | ICD-10-CM

## 2015-06-04 NOTE — Telephone Encounter (Signed)
From: Randolph Bing   Sent: Thursday, June 04, 2015 3:52 PM  To: '[email or web address]'  Subject: [place] [facility]     Dr. [name],     Your medication refill request for Zocor has been sent to the [facility] on file for 90 with 3 refills.     Let us know if you need any further assistance,     Please note that I am out of the office on Fridays.    If you need immediate assistance, please call the office or email [email or web address] .Tania Ade, RN  Lead Nurse      [place] [facility] [address], [place], [place] [address]  T [phone]  [name] [phone]           This communication may contain confidential and/or privileged information. Additionally, this communication may contain protected health information (PHI) that is legally protected from inappropriate disclosure by the Privacy Standards of the [employer] Portability and Accountability Act (HIPAA) and relevant [employer]. If you are not the intended recipient, please note that any dissemination, distribution or copying of this communication is strictly prohibited. If you have received this message in error, you should notify the sender immediately by telephone or by return e-mail and delete this message from your computer. Direct questions to the Personal assistant at [phone].

## 2015-06-25 ENCOUNTER — Other Ambulatory Visit: Payer: Self-pay | Admitting: Internal Medicine

## 2015-06-25 ENCOUNTER — Other Ambulatory Visit: Payer: Self-pay

## 2015-06-25 ENCOUNTER — Encounter: Payer: Self-pay | Admitting: Internal Medicine

## 2015-06-25 DIAGNOSIS — R400 Somnolence: Secondary | ICD-10-CM

## 2015-06-25 DIAGNOSIS — G9331 Postviral fatigue syndrome: Secondary | ICD-10-CM

## 2015-06-25 DIAGNOSIS — M797 Fibromyalgia: Secondary | ICD-10-CM

## 2015-06-25 DIAGNOSIS — G933 Postviral fatigue syndrome: Secondary | ICD-10-CM

## 2015-06-25 DIAGNOSIS — G9332 Myalgic encephalomyelitis/chronic fatigue syndrome: Secondary | ICD-10-CM

## 2015-06-25 DIAGNOSIS — R5382 Chronic fatigue, unspecified: Secondary | ICD-10-CM

## 2015-06-25 MED ORDER — OXYCODONE-ACETAMINOPHEN 5-325 MG PO TABS
ORAL_TABLET | ORAL | Status: DC
Start: 2015-06-25 — End: 2015-09-28

## 2015-06-25 MED ORDER — OXYCODONE-ACETAMINOPHEN 5-325 MG PO TABS
ORAL_TABLET | ORAL | Status: DC
Start: 2015-06-25 — End: 2015-06-25

## 2015-06-25 MED ORDER — CLONAZEPAM 0.5 MG PO TABS
0.5000 mg | ORAL_TABLET | Freq: Three times a day (TID) | ORAL | Status: DC | PRN
Start: 2015-06-25 — End: 2015-09-28

## 2015-06-25 MED ORDER — FINASTERIDE 5 MG PO TABS
5.0000 mg | ORAL_TABLET | Freq: Every day | ORAL | Status: DC
Start: 2015-06-25 — End: 2015-11-05

## 2015-06-25 MED ORDER — MODAFINIL 100 MG PO TABS
100.0000 mg | ORAL_TABLET | Freq: Three times a day (TID) | ORAL | Status: DC
Start: 2015-06-25 — End: 2015-08-20

## 2015-06-25 NOTE — Telephone Encounter (Signed)
Can you refill these?    -----Original Message-----  From: [name] [[email or web address]]   Sent: Thursday, June 25, 2015 10:03 AM  To: [name]  Subject: refills    Hi Craig,    Need refills:  Modafinil 300 mg - 3 po QD, # 90 with 1 refill Finasteride 5 mg - 1 po QD, # 90 with 1 refill [facility] - [phone]    Thanks,    Marley  07-15-1951

## 2015-06-25 NOTE — Telephone Encounter (Signed)
From: Randolph Bing   Sent: Thursday, June 25, 2015 2:46 PM  To: '[email or web address]'  Cc: [name].  Subject: [facility] - Refills     Dr. [name],     I have called in both prescriptions to the pharmacy on file.     Let me know if you should have any problems.     Please note that I am out of the office on Fridays.    If you need immediate assistance, please call the office or email [email or web address].     Renaldo Fiddler, RN  Lead Nurse      [facility] [address], [address], [place], [place] [address]  T [phone]  [name] [phone]           This communication may contain confidential and/or privileged information. Additionally, this communication may contain protected health information (PHI) that is legally protected from inappropriate disclosure by the Privacy Standards of the [employer] Portability and Accountability Act (HIPAA) and relevant [employer]. If you are not the intended recipient, please note that any dissemination, distribution or copying of this communication is strictly prohibited. If you have received this message in error, you should notify the sender immediately by telephone or by return e-mail and delete this message from your computer. Direct questions to the Personal assistant at [phone].

## 2015-06-25 NOTE — Telephone Encounter (Signed)
Medication called into pharmacy as prescribed.

## 2015-06-25 NOTE — Telephone Encounter (Addendum)
From: [name] [[email or web address]]   Sent: Thursday, June 25, 2015 4:32 PM  To: Randolph Bing  Subject: Re: [facility] - Refills     Thanks so much

## 2015-07-21 ENCOUNTER — Other Ambulatory Visit: Payer: Self-pay | Admitting: Internal Medicine

## 2015-07-21 DIAGNOSIS — J4540 Moderate persistent asthma, uncomplicated: Secondary | ICD-10-CM

## 2015-07-22 MED ORDER — ALBUTEROL SULFATE HFA 108 (90 BASE) MCG/ACT IN AERS
1.0000 | INHALATION_SPRAY | Freq: Four times a day (QID) | RESPIRATORY_TRACT | Status: DC | PRN
Start: 2015-07-22 — End: 2015-11-05

## 2015-08-19 ENCOUNTER — Encounter: Payer: Self-pay | Admitting: Internal Medicine

## 2015-08-19 ENCOUNTER — Telehealth: Payer: Self-pay

## 2015-08-19 DIAGNOSIS — M797 Fibromyalgia: Secondary | ICD-10-CM

## 2015-08-19 DIAGNOSIS — G933 Postviral fatigue syndrome: Secondary | ICD-10-CM

## 2015-08-19 DIAGNOSIS — R400 Somnolence: Secondary | ICD-10-CM

## 2015-08-19 DIAGNOSIS — R5382 Chronic fatigue, unspecified: Secondary | ICD-10-CM

## 2015-08-19 DIAGNOSIS — G9331 Postviral fatigue syndrome: Secondary | ICD-10-CM

## 2015-08-19 NOTE — Telephone Encounter (Signed)
Patient currently has Medicare as his primary insurance. Due to Medicare regulations, prior-authorization is not required for any procedure as they pay based on diagnosis for the claim.

## 2015-08-19 NOTE — Telephone Encounter (Signed)
-----  Original Message-----  From: Shannon [name]   Sent: Wednesday, August 19, 2015 10:26 AM  To: Shannon [name]  Cc: Shannon [name]; [name], Shannon M.; Shannon [name]  Subject: RE: Provigil and update    Shannon [name],  It does sound like your quality of life is much improved. So glad to hear. I will send you the Provigil. I am in and out all week and should be able to swing by the [facility] office and get it done for you tomorrow. I will refill the clonazepam and Percocet too. I agree on the MRI of the brain. No contrast needed. I will ask my team to see if preauthorization is needed for follow up of partially resected meningioma. I agree we can get it done locally. We will also send you the MRI script when we mail the scripts. Happy Chanukah and New Year.  Shannon [name]    -----Original Message-----  From: Shannon [name] [[email or web address]]   Sent: Wednesday, August 19, 2015 9:50 AM  To: Shannon [name]  Subject: Provigil and update    Hi Shannon [name],    Want to give you a quick update.  Retirement has definitely improved my quality of life.  I don't know how I ran so hard for so long.  Unfortunately, I am still fighting to get severance benefits.      I am getting more and better sleep, still taking Vital Reds and Turmeric WITH pepper, getting some exercise, meditating and trying to do all the right things.  Unfortunately, I am still requiring the same dose of Percocet and Clonazepam on most days in order to function.  I have added Advil 800 mg TID without any improvement.  Any suggestions would be welcome.  I have one more Percocet script and just filled the last Clonazepam script.    I was wondering if I should get a brain MRI as I have not had one in many years and the surgery was a subtotal resection of the meningioma.  I would rather get it here instead of going back to [facility] if you are able to order it.  Could you please also send me another script for Provigil 100 mg, # 90 (TID) with one refill?   ([address] [place] [address])    Soon, it will time for the annual exam.  Wonder if it would be worth doing it earlier?    Thanks and Happy New Year,    Shannon [name]

## 2015-08-20 ENCOUNTER — Telehealth: Payer: Self-pay | Admitting: Internal Medicine

## 2015-08-20 DIAGNOSIS — G9331 Postviral fatigue syndrome: Secondary | ICD-10-CM

## 2015-08-20 DIAGNOSIS — G933 Postviral fatigue syndrome: Secondary | ICD-10-CM

## 2015-08-20 DIAGNOSIS — R400 Somnolence: Secondary | ICD-10-CM

## 2015-08-20 MED ORDER — MODAFINIL 100 MG PO TABS
100.0000 mg | ORAL_TABLET | Freq: Three times a day (TID) | ORAL | Status: AC
Start: 2015-08-20 — End: 2015-10-19

## 2015-08-20 NOTE — Telephone Encounter (Signed)
Prescription for provigil and his MRI order have been mailed out.

## 2015-08-20 NOTE — Telephone Encounter (Signed)
Pt requesting renewal of provigil, percocet, and clonazepam. Pt trying to wean, but not successful. Pt feels he needs percocet and clonazepam for his FM and provigil for his CFS. Pt reports multiple efforts to wean meds and improve sxs. See email from pt. Pt has had no increase in med doses in many years. Prescribing database has been check this year and I am the only provider writing for these scripts We will continue to try to wean. Scripts will be written for provigil only so that there is no overlap to avoid and risk for abuse. We will refill the others when appropriate.

## 2015-08-25 NOTE — Telephone Encounter (Signed)
From: [name]   Sent: Thursday, August 20, 2015 1:11 PM  To: Randolph Bing; Flumer, Carly M.; Murphey, Stevie  Subject: Scripts and MRI    Shannon [name],  We are mailing the provigil script and MRI. I can't refill the clonazepam or Percocet yet. Too soon. I will write these the end of January.     Happy New Year.  Thx  [name]

## 2015-09-09 ENCOUNTER — Other Ambulatory Visit: Payer: Self-pay | Admitting: Internal Medicine

## 2015-09-09 DIAGNOSIS — D329 Benign neoplasm of meninges, unspecified: Secondary | ICD-10-CM

## 2015-09-10 ENCOUNTER — Other Ambulatory Visit: Payer: Self-pay

## 2015-09-10 ENCOUNTER — Ambulatory Visit: Payer: Medicare Other | Attending: Internal Medicine

## 2015-09-10 DIAGNOSIS — D329 Benign neoplasm of meninges, unspecified: Secondary | ICD-10-CM

## 2015-09-10 DIAGNOSIS — E782 Mixed hyperlipidemia: Secondary | ICD-10-CM

## 2015-09-10 DIAGNOSIS — D320 Benign neoplasm of cerebral meninges: Secondary | ICD-10-CM | POA: Insufficient documentation

## 2015-09-10 DIAGNOSIS — R9402 Abnormal brain scan: Secondary | ICD-10-CM | POA: Insufficient documentation

## 2015-09-10 DIAGNOSIS — Z9889 Other specified postprocedural states: Secondary | ICD-10-CM | POA: Insufficient documentation

## 2015-09-10 MED ORDER — EZETIMIBE 10 MG PO TABS
10.0000 mg | ORAL_TABLET | Freq: Every day | ORAL | Status: DC
Start: 2015-09-10 — End: 2015-11-05

## 2015-09-10 MED ORDER — GADOBUTROL 1 MMOL/ML IV SOLN
7.5000 mL | Freq: Once | INTRAVENOUS | Status: AC | PRN
Start: 2015-09-10 — End: 2015-09-10
  Administered 2015-09-10: 7.5 mmol via INTRAVENOUS
  Filled 2015-09-10: qty 7.5

## 2015-09-15 ENCOUNTER — Other Ambulatory Visit: Payer: Self-pay

## 2015-09-15 ENCOUNTER — Encounter: Payer: Self-pay | Admitting: Internal Medicine

## 2015-09-15 DIAGNOSIS — H53462 Homonymous bilateral field defects, left side: Secondary | ICD-10-CM | POA: Insufficient documentation

## 2015-09-15 DIAGNOSIS — E039 Hypothyroidism, unspecified: Secondary | ICD-10-CM

## 2015-09-15 MED ORDER — LEVOTHYROXINE SODIUM 200 MCG PO TABS
200.0000 ug | ORAL_TABLET | Freq: Every day | ORAL | Status: DC
Start: 2015-09-15 — End: 2015-11-05

## 2015-09-15 NOTE — Telephone Encounter (Signed)
From: Randolph Bing   Sent: Tuesday, September 15, 2015 12:29 PM  To: '[email or web address]'  Cc: [name].  Subject: [facility] - Annual Exam     Dr. [name],     We received a request for your Synthroid refill.  That refill has been processed.    Since it has been awhile since we last saw you in the office, I have scheduled you for an annual exam on October 22, 2015 @ 8:30am at the [facility].    [facility] [address]  [address]  [address]  [place], [place] [address]  T [phone]  [name] [phone]    If this day and time do not work for you, please let me know and I will be more than happy to switch it for you.     Sincerely,      Renaldo Fiddler, RN  Manager      [facility] [address], [address], [place], [place] [address]  T [phone]  [name] [phone]           This communication may contain confidential and/or privileged information. Additionally, this communication may contain protected health information (PHI) that is legally protected from inappropriate disclosure by the Privacy Standards of the [employer] Portability and Accountability Act (HIPAA) and relevant [employer]. If you are not the intended recipient, please note that any dissemination, distribution or copying of this communication is strictly prohibited. If you have received this message in error, you should notify the sender immediately by telephone or by return e-mail and delete this message from your computer. Direct questions to the Personal assistant at [phone].

## 2015-09-16 ENCOUNTER — Encounter: Payer: Self-pay | Admitting: Internal Medicine

## 2015-09-28 ENCOUNTER — Telehealth: Payer: Self-pay | Admitting: Internal Medicine

## 2015-09-28 ENCOUNTER — Telehealth: Payer: Self-pay

## 2015-09-28 ENCOUNTER — Other Ambulatory Visit: Payer: Self-pay | Admitting: Internal Medicine

## 2015-09-28 DIAGNOSIS — M797 Fibromyalgia: Secondary | ICD-10-CM

## 2015-09-28 DIAGNOSIS — G9332 Myalgic encephalomyelitis/chronic fatigue syndrome: Secondary | ICD-10-CM

## 2015-09-28 DIAGNOSIS — J0100 Acute maxillary sinusitis, unspecified: Secondary | ICD-10-CM

## 2015-09-28 DIAGNOSIS — R5382 Chronic fatigue, unspecified: Secondary | ICD-10-CM

## 2015-09-28 MED ORDER — CLONAZEPAM 0.5 MG PO TABS
0.5000 mg | ORAL_TABLET | Freq: Three times a day (TID) | ORAL | Status: DC | PRN
Start: 2015-09-28 — End: 2015-11-05

## 2015-09-28 MED ORDER — OXYCODONE-ACETAMINOPHEN 5-325 MG PO TABS
ORAL_TABLET | ORAL | Status: DC
Start: 2015-09-28 — End: 2015-09-28

## 2015-09-28 MED ORDER — LEVOFLOXACIN 750 MG PO TABS
750.0000 mg | ORAL_TABLET | Freq: Every day | ORAL | Status: AC
Start: 2015-09-28 — End: 2015-10-03

## 2015-09-28 MED ORDER — OXYCODONE-ACETAMINOPHEN 5-325 MG PO TABS
ORAL_TABLET | ORAL | Status: DC
Start: 2015-09-28 — End: 2015-11-05

## 2015-09-28 NOTE — Telephone Encounter (Signed)
Findings of sinusitis on MRI and pt reports pressure sxs c/w prior sinusitis. Pt fears it will worsen his CFS. Pt also may be going for menigioma surgery in the near future and requests treatment which is not unreasonable. Pt requested cipro, but instructed levaquin as a better choice for 5 days. Pt aware to start magnesium oxide 500mg  bid for the 5 days while on levaquina nd to stop for any tendon pain. Covering for atypicals as well.

## 2015-09-28 NOTE — Telephone Encounter (Signed)
From: [name]   Sent: Monday, September 28, 2015 12:51 PM  To: [name]  Cc: Randolph Bing  Subject: RE: always something    Levaquin 750mg  X 5 days for sinusitis ordered.   Remember to take magnesium the same time and stop for any tendon soreness.  This will kill anything in there from a bacterial standpoint.   Keep up the steroid nasal spray and a decongestant, but watch the blood pressure.

## 2015-09-28 NOTE — Progress Notes (Signed)
Refills for percocet and clonazepam. No side effects. No dose increase. Pt tried to wean this past 3 months, but unable to do so.   Pt setting up his annual exam. Pt affirms I am the only one prescribing these meds. Last check a few months ago affirmed this. Will recheck at his annual again. Will continue to try to wean. Note no dose increase in several years and all refills time correctly.

## 2015-09-28 NOTE — Telephone Encounter (Signed)
-----  Original Message-----  From: [name]   Sent: Monday, September 28, 2015 11:45 AM  To: [name]  Cc: Randolph Bing  Subject: RE: always something    I would agree on a [facility] second opinion.    As for treating a sinusitis no issue, but cirpor is the wrong drug. I would suggest 5 days of Levaquin 750mg  daily. Also I like my patients to take magnesium oxide 500mg  bid for the 5 days to avoid the tendinopathy risk. Some studies showed while a minor risk that lower magnesium increased the possible risk. Levaquin has better atypical and step pneumonia coverage. Let me know if you are ok with this and we can order it.  Thanks  [name]    -----Original Message-----  From: [name] [[email or web address]]   Sent: Monday, September 28, 2015 11:26 AM  To: [name]  Subject: always something    [name],    First problem:  having trouble with asthma and the [facility] neuroradiologist picked up mucosal thickening in my sinuses.  I sent you this report.  I also feel pressure over my sinuses.  Often in the past, a low grade sinusitis exacerbates my asthma.  Could you please phone in Cipro 500 mg BID for 14 days?    Bigger problem:  At [facility] the neurosurgeon and radiation oncologist disagree on how this should be handled.  In addition, when I asked the radiation oncologist what the grade of the original tumor was, she stated that she could not see a report from the permanent sections.  The specimen may never have gotten to pathology to perform permanent sections.  A frozen section was done in the OR.  But frozen sections are not good enough to determine the grade.  She tried to blow it off by saying very few mitoses were seen in the frozen section report.  The presence of mitoses is only part of what is evaluated in order to grade tumors.  Basically the frozen section rules out cancer.  When I said this, she said they save all tissue sections.  However, if it was misplaced there will not be any  slides to find.  As you probably know this is considered a sentinel event.  I suggested that they find it and generate a report.  She said to give her two weeks.  Of course, the neurosurgeon and radiation oncologist need to talk.    I think in order to resolve this dilemma, I need to go to [facility] for a second opinion since they are ranked #1 in neurosurgery.  [facility] is #2.    Always something,    Cutberto

## 2015-10-09 ENCOUNTER — Encounter: Payer: Self-pay | Admitting: Internal Medicine

## 2015-10-09 ENCOUNTER — Telehealth: Payer: Self-pay | Admitting: Internal Medicine

## 2015-10-09 NOTE — Telephone Encounter (Signed)
error 

## 2015-10-10 ENCOUNTER — Encounter: Payer: Self-pay | Admitting: Internal Medicine

## 2015-10-12 ENCOUNTER — Encounter: Payer: Self-pay | Admitting: Internal Medicine

## 2015-10-16 ENCOUNTER — Encounter: Payer: Self-pay | Admitting: Internal Medicine

## 2015-10-20 ENCOUNTER — Telehealth: Payer: Self-pay

## 2015-10-20 MED ORDER — CYCLOSPORINE 0.05 % OP EMUL
1.0000 [drp] | Freq: Two times a day (BID) | OPHTHALMIC | Status: DC
Start: 2015-10-20 — End: 2016-10-04

## 2015-10-20 MED ORDER — CYCLOSPORINE 0.05 % OP EMUL
1.0000 [drp] | Freq: Two times a day (BID) | OPHTHALMIC | Status: DC
Start: 2015-10-20 — End: 2015-10-20

## 2015-10-20 NOTE — Telephone Encounter (Signed)
-----  Original Message-----  From: Shannon [name] [[email or web address]]   Sent: Tuesday, October 20, 2015 3:56 PM  To: Shannon [name].  Subject: Re: stuff    Good ideas    Restasis (cyclosporine ophthalmic emulsion) 0.05% one drop BID    I'll call [facility] and ask about the Provigil.  The bottle seemed light when I picked it up.  I should have asked then.    I can certainly ask the two neurosurgeons to talk.  Never done that before (even when practicing).  Do you think it will bruise their ego's?  Neurosurgeons are primadonnas.    Thanks    On Oct 20, 2015, at 3:32 PM, Shannon [name]. wrote:    We will take care of the scripts. I need the details on the restasis-is it 0.05% susp 1 gtt in each eye every 12 hours?     Candise Bowens once confirmed can you also approve the early refill on the Provigil? And check and see if you can see why he was shorted?     Tong-ask the two neurosurgeons to talk before a third. This is what I would suggest.     Thanks  Shannon [name]    -----Original Message-----  From: Shannon [name] [[email or web address]]   Sent: Tuesday, October 20, 2015 11:45 AM  To: Shannon [name].  Subject: stuff    Shannon [name],    Firstly -  I must not have gotten 90 Provigil last time.  I filled it 09/22/15 which should last until 4/31/17  However, I only have enough for another 12 days.  I have not taken more than prescribed.     Secondly -  My optometrist gave me samples of Restasis for my dry eyes.  This has corrected my blurred vision.  Could you please phone in a script?    Thirdly - I am in patient purgatory.  [facility] says that the meningioma has minimally grown and they are recommending not treating.  At [facility], the neurosurgeon works for two weeks and then does two weeks of research.    During that time, he is unavailable.  [facility] is not available until next week to talk.  This is a strange way to practice medicine.     [facility] claims the tumor has grown and should be treated.        I hope to talk to the  neurosurgeon at [facility] again or even make an appointment.  It is strange that two of the best institutions have such different opinions.  Do I need to go somewhere else for a third opinion?  I would love to come in and talk to you if your schedule permits.    Thanks,  >   > Shannon [name]

## 2015-10-21 ENCOUNTER — Other Ambulatory Visit: Payer: Self-pay | Admitting: Internal Medicine

## 2015-10-21 DIAGNOSIS — Z1379 Encounter for other screening for genetic and chromosomal anomalies: Secondary | ICD-10-CM

## 2015-10-21 DIAGNOSIS — G9331 Postviral fatigue syndrome: Secondary | ICD-10-CM

## 2015-10-21 DIAGNOSIS — G933 Postviral fatigue syndrome: Secondary | ICD-10-CM

## 2015-10-21 DIAGNOSIS — R400 Somnolence: Secondary | ICD-10-CM

## 2015-10-21 HISTORY — DX: Encounter for other screening for genetic and chromosomal anomalies: Z13.79

## 2015-10-22 ENCOUNTER — Telehealth: Payer: Self-pay

## 2015-10-22 ENCOUNTER — Encounter: Payer: Medicare Other | Admitting: Internal Medicine

## 2015-10-22 MED ORDER — MODAFINIL 100 MG PO TABS
ORAL_TABLET | ORAL | Status: DC
Start: 2015-10-22 — End: 2016-03-18

## 2015-10-22 NOTE — Telephone Encounter (Signed)
From: Ella [email or web address]  Sent: October 21, 2015 at 6:57:28 PM   To: Allan, Momin, Randolph Bing   Subject: Re: Provigil   No problem. We will send you the refills on the provigil.   Thx      ---  Sent from [facility]  [email or web address]  On October 21, 2015 at 3:04:49 PM EST, Travonta Rebert [email or web address]> wrote:  [name],     I was mistaken. 90 pills is only for one month so I need refills since no refills are left. I can pick it up at [facility] or it can be mailed. No immediate emergency.     I think it would be a waste of your time to come in and discuss [facility] and [facility] now. I am still waiting to speak to both. I will schedule an annual exam.     My BP systolic is running in the high 130's, so we may need to make an adjustment.     Thanks,     Lenny

## 2015-10-29 ENCOUNTER — Encounter: Payer: Self-pay | Admitting: Internal Medicine

## 2015-11-05 ENCOUNTER — Encounter: Payer: Medicare Other | Admitting: Family Medicine

## 2015-11-05 ENCOUNTER — Ambulatory Visit (INDEPENDENT_AMBULATORY_CARE_PROVIDER_SITE_OTHER): Payer: Medicare Other | Admitting: Internal Medicine

## 2015-11-05 ENCOUNTER — Encounter: Payer: Self-pay | Admitting: Internal Medicine

## 2015-11-05 ENCOUNTER — Ambulatory Visit (FREE_STANDING_LABORATORY_FACILITY): Payer: Medicare Other

## 2015-11-05 VITALS — BP 136/74 | HR 85 | Temp 97.7°F | Resp 24 | Ht 69.0 in | Wt 181.9 lb

## 2015-11-05 DIAGNOSIS — D638 Anemia in other chronic diseases classified elsewhere: Secondary | ICD-10-CM

## 2015-11-05 DIAGNOSIS — D329 Benign neoplasm of meninges, unspecified: Secondary | ICD-10-CM

## 2015-11-05 DIAGNOSIS — N182 Chronic kidney disease, stage 2 (mild): Secondary | ICD-10-CM

## 2015-11-05 DIAGNOSIS — Z565 Uncongenial work environment: Secondary | ICD-10-CM

## 2015-11-05 DIAGNOSIS — R7989 Other specified abnormal findings of blood chemistry: Secondary | ICD-10-CM

## 2015-11-05 DIAGNOSIS — F1120 Opioid dependence, uncomplicated: Secondary | ICD-10-CM

## 2015-11-05 DIAGNOSIS — I10 Essential (primary) hypertension: Secondary | ICD-10-CM

## 2015-11-05 DIAGNOSIS — E291 Testicular hypofunction: Secondary | ICD-10-CM

## 2015-11-05 DIAGNOSIS — Z8639 Personal history of other endocrine, nutritional and metabolic disease: Secondary | ICD-10-CM

## 2015-11-05 DIAGNOSIS — E039 Hypothyroidism, unspecified: Secondary | ICD-10-CM

## 2015-11-05 DIAGNOSIS — E781 Pure hyperglyceridemia: Secondary | ICD-10-CM

## 2015-11-05 DIAGNOSIS — J4540 Moderate persistent asthma, uncomplicated: Secondary | ICD-10-CM

## 2015-11-05 DIAGNOSIS — M797 Fibromyalgia: Secondary | ICD-10-CM

## 2015-11-05 DIAGNOSIS — E782 Mixed hyperlipidemia: Secondary | ICD-10-CM

## 2015-11-05 DIAGNOSIS — R5382 Chronic fatigue, unspecified: Secondary | ICD-10-CM

## 2015-11-05 DIAGNOSIS — E559 Vitamin D deficiency, unspecified: Secondary | ICD-10-CM

## 2015-11-05 DIAGNOSIS — E785 Hyperlipidemia, unspecified: Secondary | ICD-10-CM

## 2015-11-05 DIAGNOSIS — Z579 Occupational exposure to unspecified risk factor: Secondary | ICD-10-CM

## 2015-11-05 DIAGNOSIS — G9332 Myalgic encephalomyelitis/chronic fatigue syndrome: Secondary | ICD-10-CM

## 2015-11-05 DIAGNOSIS — Z79899 Other long term (current) drug therapy: Secondary | ICD-10-CM

## 2015-11-05 DIAGNOSIS — Z125 Encounter for screening for malignant neoplasm of prostate: Secondary | ICD-10-CM

## 2015-11-05 DIAGNOSIS — Z0000 Encounter for general adult medical examination without abnormal findings: Secondary | ICD-10-CM

## 2015-11-05 DIAGNOSIS — F3289 Other specified depressive episodes: Secondary | ICD-10-CM

## 2015-11-05 DIAGNOSIS — N401 Enlarged prostate with lower urinary tract symptoms: Secondary | ICD-10-CM

## 2015-11-05 DIAGNOSIS — F1190 Opioid use, unspecified, uncomplicated: Secondary | ICD-10-CM

## 2015-11-05 DIAGNOSIS — N400 Enlarged prostate without lower urinary tract symptoms: Secondary | ICD-10-CM

## 2015-11-05 DIAGNOSIS — D320 Benign neoplasm of cerebral meninges: Secondary | ICD-10-CM

## 2015-11-05 LAB — CBC AND DIFFERENTIAL
Basophils Absolute Automated: 0.03 10*3/uL (ref 0.00–0.20)
Basophils Automated: 0 %
Eosinophils Absolute Automated: 0.48 10*3/uL (ref 0.00–0.70)
Eosinophils Automated: 7 %
Hematocrit: 41.3 % — ABNORMAL LOW (ref 42.0–52.0)
Hgb: 13.3 g/dL (ref 13.0–17.0)
Immature Granulocytes Absolute: 0.02 10*3/uL
Immature Granulocytes: 0 %
Lymphocytes Absolute Automated: 2.73 10*3/uL (ref 0.50–4.40)
Lymphocytes Automated: 37 %
MCH: 30.4 pg (ref 28.0–32.0)
MCHC: 32.2 g/dL (ref 32.0–36.0)
MCV: 94.5 fL (ref 80.0–100.0)
MPV: 12.2 fL (ref 9.4–12.3)
Monocytes Absolute Automated: 0.74 10*3/uL (ref 0.00–1.20)
Monocytes: 10 %
Neutrophils Absolute: 3.31 10*3/uL (ref 1.80–8.10)
Neutrophils: 45 %
Nucleated RBC: 0 /100 WBC (ref 0–1)
Platelets: 294 10*3/uL (ref 140–400)
RBC: 4.37 10*6/uL — ABNORMAL LOW (ref 4.70–6.00)
RDW: 13 % (ref 12–15)
WBC: 7.31 10*3/uL (ref 3.50–10.80)

## 2015-11-05 LAB — GFR: EGFR: 46.8

## 2015-11-05 LAB — URINALYSIS WITH MICROSCOPIC
Bilirubin, UA: NEGATIVE
Blood, UA: NEGATIVE
Glucose, UA: NEGATIVE
Ketones UA: NEGATIVE
Leukocyte Esterase, UA: NEGATIVE
Nitrite, UA: NEGATIVE
Protein, UR: NEGATIVE
Specific Gravity UA: 1.018 (ref 1.001–1.035)
Urine pH: 6 (ref 5.0–8.0)
Urobilinogen, UA: 0.2 (ref 0.2–2.0)

## 2015-11-05 LAB — COMPREHENSIVE METABOLIC PANEL
ALT: 22 U/L (ref 0–55)
AST (SGOT): 23 U/L (ref 5–34)
Albumin/Globulin Ratio: 1.6 (ref 0.9–2.2)
Albumin: 4.3 g/dL (ref 3.5–5.0)
Alkaline Phosphatase: 43 U/L (ref 38–106)
BUN: 37 mg/dL — ABNORMAL HIGH (ref 9.0–28.0)
Bilirubin, Total: 0.5 mg/dL (ref 0.1–1.2)
CO2: 23 mEq/L (ref 21–30)
Calcium: 9.7 mg/dL (ref 8.5–10.5)
Chloride: 106 mEq/L (ref 100–111)
Creatinine: 1.5 mg/dL (ref 0.5–1.5)
Globulin: 2.7 g/dL (ref 2.0–3.7)
Glucose: 116 mg/dL — ABNORMAL HIGH (ref 70–100)
Potassium: 4.3 mEq/L (ref 3.5–5.3)
Protein, Total: 7 g/dL (ref 6.0–8.3)
Sodium: 141 mEq/L (ref 135–146)

## 2015-11-05 LAB — RAPID DRUG SCREEN, URINE
Barbiturate Screen, UR: NEGATIVE
Benzodiazepine Screen, UR: NEGATIVE
Cannabinoid Screen, UR: POSITIVE — AB
Cocaine, UR: NEGATIVE
Opiate Screen, UR: NEGATIVE
PCP Screen, UR: NEGATIVE
Urine Amphetamine Screen: NEGATIVE

## 2015-11-05 LAB — VITAMIN D,25 OH,TOTAL: Vitamin D, 25 OH, Total: 52 ng/mL (ref 30–100)

## 2015-11-05 LAB — HEMOLYSIS INDEX: Hemolysis Index: 13 (ref 0–18)

## 2015-11-05 LAB — PROLACTIN: Prolactin: 11.5 ng/mL (ref 3.5–19.4)

## 2015-11-05 LAB — HIV AG/AB 4TH GENERATION: HIV Ag/Ab, 4th Generation: NONREACTIVE

## 2015-11-05 LAB — T4, FREE: T4 Free: 0.95 ng/dL (ref 0.70–1.48)

## 2015-11-05 LAB — TSH: TSH: 2.2 u[IU]/mL (ref 0.35–4.94)

## 2015-11-05 LAB — HEMOGLOBIN A1C
Average Estimated Glucose: 114 mg/dL
Hemoglobin A1C: 5.6 % (ref 4.6–5.9)

## 2015-11-05 LAB — URIC ACID: Uric acid: 5.7 mg/dL (ref 3.6–9.7)

## 2015-11-05 LAB — C-REACTIVE PROTEIN: C-Reactive Protein: <0.1 mg/dL (ref 0.0–0.8)

## 2015-11-05 MED ORDER — DULOXETINE HCL 60 MG PO CPEP
60.0000 mg | ORAL_CAPSULE | Freq: Every day | ORAL | Status: DC
Start: 2015-11-05 — End: 2016-12-20

## 2015-11-05 MED ORDER — FINASTERIDE 5 MG PO TABS
5.0000 mg | ORAL_TABLET | Freq: Every day | ORAL | Status: DC
Start: 2015-11-05 — End: 2016-12-20

## 2015-11-05 MED ORDER — ALBUTEROL SULFATE HFA 108 (90 BASE) MCG/ACT IN AERS
1.0000 | INHALATION_SPRAY | Freq: Four times a day (QID) | RESPIRATORY_TRACT | Status: DC | PRN
Start: 2015-11-05 — End: 2016-05-09

## 2015-11-05 MED ORDER — EZETIMIBE 10 MG PO TABS
10.0000 mg | ORAL_TABLET | Freq: Every day | ORAL | Status: DC
Start: 2015-11-05 — End: 2016-08-09

## 2015-11-05 MED ORDER — AMLODIPINE BESYLATE 5 MG PO TABS
5.0000 mg | ORAL_TABLET | Freq: Two times a day (BID) | ORAL | Status: DC
Start: 2015-11-05 — End: 2016-12-20

## 2015-11-05 MED ORDER — RAMIPRIL 10 MG PO CAPS
20.0000 mg | ORAL_CAPSULE | Freq: Every day | ORAL | Status: DC
Start: 2015-11-05 — End: 2016-11-10

## 2015-11-05 MED ORDER — MONTELUKAST SODIUM 10 MG PO TABS
10.0000 mg | ORAL_TABLET | Freq: Every evening | ORAL | Status: DC
Start: 2015-11-05 — End: 2016-11-10

## 2015-11-05 MED ORDER — LEVOTHYROXINE SODIUM 200 MCG PO TABS
200.0000 ug | ORAL_TABLET | Freq: Every day | ORAL | Status: DC
Start: 2015-11-05 — End: 2016-12-20

## 2015-11-05 MED ORDER — CLONAZEPAM 0.5 MG PO TABS
0.5000 mg | ORAL_TABLET | Freq: Four times a day (QID) | ORAL | Status: DC | PRN
Start: 2015-11-05 — End: 2016-03-07

## 2015-11-05 MED ORDER — FENOFIBRATE 145 MG PO TABS
145.0000 mg | ORAL_TABLET | Freq: Every day | ORAL | Status: DC
Start: 2015-11-05 — End: 2016-11-10

## 2015-11-05 NOTE — Progress Notes (Signed)
Past Medical History   Diagnosis Date   . Color blindness    . Hemorrhoids    . Myalgia    . Anemia      Mild   . Hypothyroidism    . Hypertension    . Osteoporosis    . Asthma    . Benign prostate hyperplasia    . Hyperlipidemia    . Hypertriglyceridemia    . Depression      mild, pain related   . DJD (degenerative joint disease)      Lumbar / Sacral   . Avascular necrosis      Bilateral hip   . Sinus problem      Intermittent   . Fatigue    . Cold intolerance    . Muscle pain    . Joint pain    . Snoring    . Lateral epicondylitis      Left   . History of colonoscopy 2008     normal, repeat in 10 years?   . Hypogonadism male    . Low serum vitamin D    . Chronic fatigue fibromyalgia syndrome    . History of MRI 09/10/15     MRI Brain with/without contrast       Past Surgical History   Procedure Laterality Date   . Brain surgery       Partial brain resection secondary to meningioma.    . Tonsillectomy         Allergies   Allergen Reactions   . Bextra [Valdecoxib] Rash       Current Outpatient Prescriptions on File Prior to Visit   Medication Sig Dispense Refill   . albuterol (VENTOLIN HFA) 108 (90 BASE) MCG/ACT inhaler Inhale 1 puff into the lungs every 6 (six) hours as needed for Wheezing. 1 Inhaler 5   . amLODIPine (NORVASC) 5 MG tablet Take 1 tablet (5 mg total) by mouth daily. 90 tablet 3   . aspirin 81 MG EC tablet Take 81 mg by mouth daily.       . Calcium Carbonate-Vitamin D (CALCIUM + D PO) Take by mouth. 500/400mg   daily      . clonazePAM (KLONOPIN) 0.5 MG tablet Take 1 tablet (0.5 mg total) by mouth 3 (three) times daily as needed for Anxiety (muscle pain and spasm). (Patient taking differently: Take 0.5 mg by mouth 4 (four) times daily as needed for Anxiety (muscle pain and spasm).   ) 90 tablet 1   . Coenzyme Q10 100 MG capsule Take 100 mg by mouth daily.     . cycloSPORINE (RESTASIS) 0.05 % ophthalmic emulsion Place 1 drop into both eyes 2 (two) times daily. 180 each 0   . DULoxetine (CYMBALTA) 60 MG  capsule Take 1 capsule (60 mg total) by mouth daily. 90 capsule 3   . ezetimibe (ZETIA) 10 MG tablet Take 1 tablet (10 mg total) by mouth daily. 90 tablet 3   . fenofibrate (TRICOR) 145 MG tablet Take 1 tablet (145 mg total) by mouth daily. 90 tablet 3   . finasteride (PROSCAR) 5 MG tablet Take 1 tablet (5 mg total) by mouth daily. 90 tablet 1   . levothyroxine (SYNTHROID, LEVOTHROID) 200 MCG tablet Take 1 tablet (200 mcg total) by mouth daily. 90 tablet 3   . modafinil (PROVIGIL) 100 MG tablet TAKE ONE TABLET BY MOUTH THREE TIMES DAILY 90 tablet 3   . montelukast (SINGULAIR) 10 MG tablet Take 1 tablet (10 mg total) by  mouth nightly. 90 tablet 3   . Multiple Vitamins-Minerals (CENTRUM SILVER PO) Take 1 tablet by mouth daily.       . ramipril (ALTACE) 10 MG capsule Take 2 capsules (20 mg total) by mouth daily. 180 capsule 3   . testosterone (ANDROGEL) 50 MG/5GM (1%) Gel Apply 2.5 gm (1/2 packet) once daily on the skin as directed 150 Package 3   . triamcinolone (NASACORT) 55 MCG/ACT nasal inhaler USE 2 SPRAYS NASALLY TWICE DAILY 4 Inhaler 3   . vitamin E 1000 UNIT capsule Take by mouth daily.        [name] FLUZONE HIGH-DOSE 0.5 ML Suspension Prefilled Syringe IM injection (age 66 & greater)   0   . levETIRAcetam (KEPPRA) 500 MG tablet Take 1 tablet (500 mg total) by mouth 2 (two) times daily. 60 tablet STOPPED-NOT TAKING   . simvastatin (ZOCOR) 20 MG tablet Take 1 tablet (20 mg total) by mouth nightly. 90 tablet 3-STOPPED-NOT TAKING   . traMADol (ULTRAM) 50 MG tablet Take 1 tablet (50 mg total) by mouth daily. 30 tablet HELPED WITH PAIN-RAN OUT   . VITAMIN D, CHOLECALCIFEROL, PO Take 5,000 Units by mouth daily.        . [DISCONTINUED] oxyCODONE-acetaminophen (ROXICET) 5-325 MG per tablet 1 tablets every 12 hours 60 tablet 0     No current facility-administered medications on file prior to visit.       Social History     Social History   . Marital Status: Married     Spouse Name: Angelique Blonder   . Number of Children: 2   . Years  of Education: MD-Obgyn     Occupational History   . Not on file.     Social History Main Topics   . Smoking status: Never Smoker    . Smokeless tobacco: None   . Alcohol Use: No   . Drug Use: No-see note below   . Sexual Activity: Currently inactive     Other Topics Concern   . Not on file     Social History Narrative       Family History   Problem Relation Age of Onset   . Asthma Daughter    . Tuberculosis Mother    No new family dx this year    Current/HPI:  1. Tinnitus-pt notes this to be a little more evident--blood pressure up a little-5mg  amlopidine add to morning- pt seen last Thursday and over this weekend reported the tinnitus is gone with better blood pressure control. He will continue to monitor.   2. Marriage improved since retirement . Wife not interested in sexual intercourse anymore per pt.   3. Insomnia-two hours before bed sunglasses and sleeping when he does this.  When the room is cold he sleeps better.  4. Sleep apnea-pt feels he does not have-frequency at night still there, but better-has to eat through the night-2 times a night hungry-Wears a guard, but not prescription. Had a sleep study at [facility] tha the says was mildly positive for sleep apnea. I said I would review and we would decide if a formal study was needed.   5. [facility] said no surgery right now. Is cyberknife or gamma knife better-ask our folks-for mengioma-cyber seems better per Zamarian.   6. Has tried cannabis oil to see if it would relieve his fibromyalgia pain, but it did not. When recently in another state he wanted to see if smoking marijuana would help his pain. He says he did take  two puffs of marijuana and said his urine will be positive for it. He said it did make his muscle pain less, but that he would not want to do it for it alters his mentation and he does not want to be "stoned" through life. He functions with percocet and clonazepam.    7. Has tried to wean percocet and clonazepam- and states his muscle pain  becomes unbearable interfering with his quality of life and daily functioning.   8. Prolactin a little elevated-mild-we discussed this is not related to a prolactin secreting tumor and could be watched.  9.BPH-doing ok and working-stopped flomax-proscar working well.   10. Lungs-asthma-ok-no flares  11. Depression-pt states he is not depressed. His meds are for his FM and he is happier than ever before having now retired.   12. Lipids-ok-zetia-and TG med-avoid statins  13. Medimap-wants to do, but was chewing gum--can come back-  13. Colon cancer screen-DNA testing only wanted-states the prep makes him ill and refuses a scope or CT colonoscopy as they require a prep.   14. To get Prevnar 13 at pharmacy.   15. Dental up to date  16. Vision-thoroughly evaluated in recent consult report.   17. Medicare ADLs-reviewed and no limitations  18. ALl vaccines reviewed and prevnar due-see above  19. 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     ROS:  HEENT-positive for tinnitus. No vertigo, no hearing loss, no sinus issues, no eye issues (eval noted from [facility]), no nasal issues, no throat issues.  Lungs-no SOB, no wheezing  Cor-no palp, no cp, no tachy  GI-no dyspepsia, nl BMS, no pain  GU-controlled frequency with meds, no dysuria  Heme-no bruising or bleeding  Rheum-chronic stable generalized muscle pain without specific tender points, no join tissues or pain  Derm-no concerns about any abnormal eruptions  MSK-neg except FM issue  Psych-pt reports being happier since retirement despite chronic medical issues.     PE  BP 136/74 mmHg  Pulse 85  Temp(Src) 97.7 F (36.5 C) (Oral)  Resp 24  Ht 1.753 m (5\' 9" )  Wt 82.509 kg (181 lb 14.4 oz)  BMI 26.85 kg/m2  SpO2 97%    General Appearance:    Alert, cooperative, no distress, appears stated age   Head:    Normocephalic, without obvious abnormality, atraumatic   Eyes:    PERRL, conjunctiva/corneas clear, EOM's  intact, fundi     benign, both eyes        Ears:    Normal TM's and external ear canals, both ears   Nose:   Nares normal, septum midline, mucosa normal, no drainage    or sinus tenderness   Throat:   Lips, mucosa, and tongue normal; teeth and gums normal   Neck:   Supple, symmetrical, trachea midline, no adenopathy;        thyroid:  No enlargement/tenderness/nodules; no carotid    bruit or JVD   Back:     Symmetric, no curvature, ROM normal, no CVA tenderness   Lungs:     Clear to auscultation bilaterally, respirations unlabored   Chest wall:    No tenderness or deformity   Heart:    Regular rate and rhythm, S1 and S2 normal, no murmur, rub    or gallop   Abdomen:     Soft, non-tender, bowel sounds active all four quadrants,     no masses, no organomegaly   Genitalia:  Normal male without lesion, discharge or tenderness   Rectal:    Normal tone, normal prostate, no masses or tenderness;    guaiac negative stool   Extremities:   Extremities normal, atraumatic, no cyanosis or edema. No frank point tenderness on the extremities, neck, or back.    Pulses:   2+ and symmetric all extremities   Skin:   Skin color, texture, turgor normal, no rashes or lesions   Lymph nodes:   Cervical, supraclavicular, and axillary nodes normal   Neurologic:   Normal strength, sensation and reflexes       throughout       LABS/Studies:  Clinical Support on 11/05/2015   Component Date Value Ref Range Status   . WBC 11/05/2015 7.31  3.50 - 10.80 x10 3/uL Final   . Hgb 11/05/2015 13.3  13.0 - 17.0 g/dL Final   . Hematocrit 78/29/5621 41.3* 42.0 - 52.0 % Final   . Platelets 11/05/2015 294  140 - 400 x10 3/uL Final   . RBC 11/05/2015 4.37* 4.70 - 6.00 x10 6/uL Final   . MCV 11/05/2015 94.5  80.0 - 100.0 fL Final   . MCH 11/05/2015 30.4  28.0 - 32.0 pg Final   . MCHC 11/05/2015 32.2  32.0 - 36.0 g/dL Final   . RDW 30/86/5784 13  12 - 15 % Final   . MPV 11/05/2015 12.2  9.4 - 12.3 fL Final   . Neutrophils 11/05/2015 45  None % Final   .  Lymphocytes Automated 11/05/2015 37  None % Final   . Monocytes 11/05/2015 10  None % Final   . Eosinophils Automated 11/05/2015 7  None % Final   . Basophils Automated 11/05/2015 0  None % Final   . Immature Granulocyte 11/05/2015 0  None % Final   . Nucleated RBC 11/05/2015 0  0 - 1 /100 WBC Final   . Neutrophils Absolute 11/05/2015 3.31  1.80 - 8.10 x10 3/uL Final   . Abs Lymph Automated 11/05/2015 2.73  0.50 - 4.40 x10 3/uL Final   . Abs Mono Automated 11/05/2015 0.74  0.00 - 1.20 x10 3/uL Final   . Abs Eos Automated 11/05/2015 0.48  0.00 - 0.70 x10 3/uL Final   . Absolute Baso Automated 11/05/2015 0.03  0.00 - 0.20 x10 3/uL Final   . Absolute Immature Granulocyte 11/05/2015 0.02  0 x10 3/uL Final   . Glucose 11/05/2015 116* 70 - 100 mg/dL Final   . BUN 69/62/9528 37.0* 9.0 - 28.0 mg/dL Final   . Creatinine 41/32/4401 1.5  0.5 - 1.5 mg/dL Final   . Sodium 02/72/5366 141  135 - 146 mEq/L Final   . Potassium 11/05/2015 4.3  3.5 - 5.3 mEq/L Final   . Chloride 11/05/2015 106  100 - 111 mEq/L Final   . CO2 11/05/2015 23  21 - 30 mEq/L Final   . Calcium 11/05/2015 9.7  8.5 - 10.5 mg/dL Final   . Protein, Total 11/05/2015 7.0  6.0 - 8.3 g/dL Final   . Albumin 44/10/4740 4.3  3.5 - 5.0 g/dL Final   . AST (SGOT) 59/56/3875 23  5 - 34 U/L Final   . ALT 11/05/2015 22  0 - 55 U/L Final   . Alkaline Phosphatase 11/05/2015 43  38 - 106 U/L Final   . Bilirubin, Total 11/05/2015 0.5  0.1 - 1.2 mg/dL Final   . Globulin 64/33/2951 2.7  2.0 - 3.7 g/dL Final   . Albumin/Globulin Ratio 11/05/2015 1.6  0.9 - 2.2 Final   . C-Reactive Protein 11/05/2015 <0.1  0.0 - 0.8 mg/dL Final   . Thyroid Stimulating Hormone 11/05/2015 2.20  0.35 - 4.94 uIU/mL Final   . Uric acid 11/05/2015 5.7  3.6 - 9.7 mg/dL Final   . Urine Type 86/57/8469 Clean Catch   Final   . Color, UA 11/05/2015 YELLOW  Clear - Yellow Final   . Clarity, UA 11/05/2015 CLEAR  Clear - Hazy Final   . Specific Gravity UA 11/05/2015 1.018  1.001-1.035 Final   . Urine pH  11/05/2015 6.0  5.0-8.0 Final   . Leukocyte Esterase, UA 11/05/2015 NEGATIVE  Negative Final   . Nitrite, UA 11/05/2015 NEGATIVE  Negative Final   . Protein, UR 11/05/2015 NEGATIVE  Negative Final   . Glucose, UA 11/05/2015 NEGATIVE  Negative Final   . Ketones UA 11/05/2015 NEGATIVE  Negative Final   . Urobilinogen, UA 11/05/2015 0.2  0.2-2.0 Final   . Bilirubin, UA 11/05/2015 NEGATIVE  Negative Final   . Blood, UA 11/05/2015 NEGATIVE  Negative Final   . RBC, UA 11/05/2015 0-5  0-5 /hpf Final   . WBC, UA 11/05/2015 0-5  0-5 /hpf Final   . Squamous Epithelial Cells, Urine 11/05/2015 0-5  0-25 /hpf Final   . Urine Bacteria 11/05/2015 NONE  Rare /hpf Final   . Hyaline Casts, UA 11/05/2015 0-5  0-5 /lpf Final   . Vitamin D, 25 OH, Total 11/05/2015 52  30 - 100 ng/mL Final   . HIV Ag/Ab, 4th Generation 11/05/2015 Non-Reactive  Non-Reactive Final   . Hemolysis Index 11/05/2015 13  0 - 18 Final   . EGFR 11/05/2015 46.8   Final   . Hemoglobin A1C 11/05/2015 5.6  4.6 - 5.9 % Final   . Average Estimated Glucose 11/05/2015 114.0   Final   . Testosterone,Total,LC/MS/MS 11/05/2015 413  250 - 1100 ng/dL Final   . Free Testosterone 11/05/2015 71.0  35.0 - 155.0 pg/mL Final   . T4 Free 11/05/2015 0.95  0.70 - 1.48 ng/dL Final   . RPR 62/95/2841 Non Reactive  Non-Reactive Final   . Amphetamine Screen, UR 11/05/2015 Negative   Final   . Barbiturate Screen, UR 11/05/2015 Negative   Final   . Benzodiazepine Screen, UR 11/05/2015 Negative   Final   . Cannabinoid Screen, UR 11/05/2015 Positive*  Final   . Cocaine, UR 11/05/2015 Negative   Final   . Opiate Screen, UR 11/05/2015 Negative   Final   . PCP Screen, UR 11/05/2015 Negative   Final   . Prolactin 11/05/2015 11.5  3.5 - 19.4 ng/mL Final   EKG-No acute changes compared to prior. No acute or chronic issues  Hearing-reviewed-mild high freq hearing loss  [facility] report-reviewed  Inbody-body weight and comp reviewed and compared to prior years  PFT-WNL    A/P:  1. Neuro-pt  evaluated at [facility] for his meningioma that is central and abuts the sella and optic chiasm region-growth was minimal and both rec against any surgery. F/U annually.   2. HTN-mild increases in systolic-increase amlodipine to 5mg  bid plus the ramipril 20mg  daily.   3. Post EBV FM/CFS-pt is at his stable baseline of the last 10 or so years. Pt reports exercise and sleep alone do not take his pain away. He reports trying to wean percocet and clonazepam, but unsuccessful. He admits to trying marijuana which helped, but he did not like how it made him feel mentally. Marijuana oil did nothing. Per state  guidelines a tox screen was obtained which did not reveal narcotics despite take percocet bid-pt will be contacted and test repeated. Pt also evaluated for benzodiazepine as he is on clonazepam. Last check of state prescribing monitoring program showed proper filling of scripts. Will recheck again with this documentation. I urged the pt to try and wean, but he says he physcially cannot. He has no signs these meds impacting his life, safety, or the safety of others. He has requested no dose change in many years. Pt reports provigil is needed for his chronic fatigue syndrome.It clearly balalnces any added risk for sedation from other meds. Cymbalta alos to help with FM pain-tolerated well.   4. Sleep apnea-assess [facility] report and determine if a pulm consult and formal sleep study are indicated.   5. Hyperlipidemia-controlled with zetia. Pt had muscle pain worsening with statins-have advised to do PGx testing.   6. Hypertriglyceridemia-continue tricor.  7. Pulm-asthma-not active/well controlled. Continue singulair.   8. Prevnar 13 due-directed to obtain at a pharmacy.  9. GI-pt declines colonoscopy despite being due and states he wishes to do the FIT DNA testing-we will arrange.  10. Tinnitus-resolved with better blood pressure control.  11. Hypothyroidism-controlled on synthroid  12. Hypogonadism-well treated on  testosterone repalcement.  13. All annual meds renewed.   14. GU-BPH well controlled on current med.   15. Low back pain-not an issue now not operating long hours.  16. Osteoporosis-cont diet calcium and vit D replacement plus exercise and testosterone replacement.

## 2015-11-05 NOTE — Addendum Note (Signed)
Addended by: Netty Starring A. on: 11/05/2015 02:55 PM     Modules accepted: Orders

## 2015-11-05 NOTE — Progress Notes (Signed)
Labs drawn from right anticubital on first attempt using aseptic technique, pressure applied to site with no bleeding noted, patient tol well.Fitness Evaluation:    Body Fat as percentage: In men, over 25% is obese, 20-25% is higher than normal, 16-20% is healthy / normal, <16% or under is considered lean / ideal.   2017= 25.6 %       Visceral Fat: Abdominal "belly" fat.Visceral fat is a type of body fat that exists in the abdomen and surrounds the internal organs. Everyone has some, especially those who are sedentary, chronically stressed, or maintain unhealthy diets. A different type of fat -- subcutaneous fat -- which builds up under the skin, has less of a negative impact on health and is easier to lose than visceral fat. A high level of visceral fat can increase your risk for serious health problems including cardiovascular disease, types 2 diabetes, and increased blood pressure.    2017 = 10    Current Exercise Program:   Patient implements aerobic activity four days weekly    Light weight Strength training two times weekly   Stretching None

## 2015-11-05 NOTE — Progress Notes (Signed)
Labs drawn from right anticubital on first attempt using aseptic technique by MA Silver, pressure applied to site with no bleeding noted, patient tol well.

## 2015-11-06 LAB — RPR (REFLEX TO TITER AND CONFIRMATION): RPR: NONREACTIVE

## 2015-11-07 LAB — TESTOSTERONE, FREE, TOTAL
Testosterone Free: 71 pg/mL (ref 35.0–155.0)
Testosterone Total MS: 413 ng/dL (ref 250–1100)

## 2015-11-09 ENCOUNTER — Other Ambulatory Visit: Payer: Self-pay

## 2015-11-09 ENCOUNTER — Telehealth: Payer: Self-pay

## 2015-11-09 DIAGNOSIS — F1190 Opioid use, unspecified, uncomplicated: Secondary | ICD-10-CM

## 2015-11-09 DIAGNOSIS — Z86011 Personal history of benign neoplasm of the brain: Secondary | ICD-10-CM | POA: Insufficient documentation

## 2015-11-09 DIAGNOSIS — G8929 Other chronic pain: Secondary | ICD-10-CM | POA: Insufficient documentation

## 2015-11-09 DIAGNOSIS — F1120 Opioid dependence, uncomplicated: Secondary | ICD-10-CM

## 2015-11-09 DIAGNOSIS — Z1379 Encounter for other screening for genetic and chromosomal anomalies: Secondary | ICD-10-CM

## 2015-11-09 DIAGNOSIS — Z79899 Other long term (current) drug therapy: Secondary | ICD-10-CM

## 2015-11-09 DIAGNOSIS — M549 Dorsalgia, unspecified: Secondary | ICD-10-CM | POA: Insufficient documentation

## 2015-11-09 MED ORDER — NALOXONE HCL 2 MG/2ML IJ SOSY
2.0000 mg | PREFILLED_SYRINGE | INTRAMUSCULAR | Status: DC | PRN
Start: 2015-11-09 — End: 2016-12-20

## 2015-11-09 NOTE — Progress Notes (Signed)
FromNida Boatman   Sent: Monday, November 09, 2015 2:42 PM  To: '[email or web address]'  Subject: [facility]    Hi Zylan,    I hope this email finds you well.    I am just reaching out to you per Dr. Toni Amend' request as he wants to add on a PSA to your labs from last week. However it appears that Medicare requires that you sign the attached ABN waiver for the test. We usually recommend having the patient select option one and then signing the second page. The first option just states that you want the PSA run and you want Medicare billed and only if they deny the claim - will you then be responsible for the full amount of the lab test, $59.    Please sign and return via email as soon as possible.    If you have any further questions or concerns, please feel free to call our office at [phone].    Thank you,  Azaiah Licciardi

## 2015-11-09 NOTE — Telephone Encounter (Signed)
Naloxone prescription called into [facility] per Dr. Toni Amend and patients medication list.

## 2015-11-09 NOTE — Addendum Note (Signed)
Addended byNida Boatman on: 11/09/2015 02:35 PM     Modules accepted: Orders

## 2015-11-10 LAB — PSA, TOTAL(SOFT): PSA, Total: 0.2 ng/mL (ref <=4.0)

## 2015-11-11 ENCOUNTER — Ambulatory Visit: Payer: Medicare Other

## 2015-11-11 ENCOUNTER — Ambulatory Visit (FREE_STANDING_LABORATORY_FACILITY): Payer: Medicare Other

## 2015-11-11 DIAGNOSIS — F1120 Opioid dependence, uncomplicated: Secondary | ICD-10-CM

## 2015-11-11 DIAGNOSIS — Z79899 Other long term (current) drug therapy: Secondary | ICD-10-CM

## 2015-11-11 DIAGNOSIS — Z1379 Encounter for other screening for genetic and chromosomal anomalies: Secondary | ICD-10-CM

## 2015-11-11 LAB — RAPID DRUG SCREEN, URINE
Barbiturate Screen, UR: NEGATIVE
Benzodiazepine Screen, UR: NEGATIVE
Cannabinoid Screen, UR: NEGATIVE
Cocaine, UR: NEGATIVE
Opiate Screen, UR: NEGATIVE
PCP Screen, UR: NEGATIVE
Urine Amphetamine Screen: NEGATIVE

## 2015-11-12 ENCOUNTER — Other Ambulatory Visit: Payer: Self-pay | Admitting: Internal Medicine

## 2015-11-12 DIAGNOSIS — F1120 Opioid dependence, uncomplicated: Secondary | ICD-10-CM

## 2015-11-12 DIAGNOSIS — Z79899 Other long term (current) drug therapy: Secondary | ICD-10-CM

## 2015-11-16 ENCOUNTER — Other Ambulatory Visit: Payer: Self-pay | Admitting: Internal Medicine

## 2015-11-16 ENCOUNTER — Telehealth: Payer: Self-pay

## 2015-11-16 DIAGNOSIS — Z79899 Other long term (current) drug therapy: Secondary | ICD-10-CM

## 2015-11-16 DIAGNOSIS — F1190 Opioid use, unspecified, uncomplicated: Secondary | ICD-10-CM

## 2015-11-16 NOTE — Telephone Encounter (Signed)
Blood draw appointment needed. Patient is going to call back and schedule the appointment.  Shannon [name]

## 2015-11-19 ENCOUNTER — Ambulatory Visit (INDEPENDENT_AMBULATORY_CARE_PROVIDER_SITE_OTHER): Payer: Medicare Other

## 2015-11-19 DIAGNOSIS — Z79899 Other long term (current) drug therapy: Secondary | ICD-10-CM

## 2015-11-19 DIAGNOSIS — F1190 Opioid use, unspecified, uncomplicated: Secondary | ICD-10-CM

## 2015-11-19 NOTE — Progress Notes (Signed)
Labs drawn from right anticubital on first attempt using aseptic technique, pressure applied to site with no bleeding noted, patient tol well.

## 2015-11-24 ENCOUNTER — Telehealth: Payer: Self-pay | Admitting: Internal Medicine

## 2015-11-24 LAB — MEDIMAP(R)- VIP360 - ONLY (SOFT)

## 2015-11-24 NOTE — Telephone Encounter (Signed)
Patient's Medimap results have been mailed out to him. I also emailed the patient to let him know as well as to schedule a follow up phone call to discuss the results.

## 2015-11-30 LAB — OXYCODONE AND METABOLITE, QUANTITATIVE, SERUM
Oxycodone: <5 ng/mL — ABNORMAL LOW (ref 10–100)
Oxymorphone: <5 ng/mL

## 2015-11-30 LAB — CLONAZEPAM LEVEL: Clonazepam: 10 mcg/L — ABNORMAL LOW (ref 30–60)

## 2015-12-21 ENCOUNTER — Other Ambulatory Visit: Payer: Self-pay

## 2015-12-21 NOTE — Telephone Encounter (Signed)
From: Randolph Bing   Sent: Monday, Dec 21, 2015 11:47 AM  To: Toni Amend, [name] [name].[email or web address])'  Cc: '[email or web address]'; Ulomi, Lola; Anne Hahn, Angel D.  Subject: Prescription    Dr. Toni Amend,     Dr. [name] would like to pick up a prescription for Percocet on Thursday @ [facility].   Encounter started in [facility] and routed to you to print when at [facility] on Thursday.   Will stop by in the morning and will also be bringing his forms and annual payment of $1500.00.    Renaldo Fiddler, RN, BSN  Manager   [facility] [address], [place], [place] [address]  T [phone]  [name] [phone]      This communication may contain confidential and/or privileged information. Additionally, this communication may contain protected health information (PHI) that is legally protected from inappropriate disclosure by the Privacy Standards of the [employer] Portability and Accountability Act (HIPAA) and relevant [employer]. If you are not the intended recipient, please note that any dissemination, distribution or copying of this communication is strictly prohibited. If you have received this message in error, you should notify the sender immediately by telephone or by return e-mail and delete this message from your computer. Direct questions to the Personal assistant at [phone].

## 2015-12-21 NOTE — Telephone Encounter (Signed)
From: [name]   Sent: Monday, Dec 21, 2015 12:50 PM  To: [name]  Cc: Amous, Neda M.; Randolph Bing  Subject: Any update from [facility]?    Shannon [name],  Two things:  1. Any [facility] update-GH startred or not?  2. Your third medication screen came back negative as well. This puts me in a legal bind. I know it is an issue of timing and metabolism. We may have to have you bring a dose of each and take and then come back 1 to 2 hours later and run the tests. Or just have you come in 1 to 2 hours after an rerun yes a fourth time. We did verify that we are running the right tests and even changed it for the third one which should have been a guarantee. Wonder if you have become so used to these that you are an ultra metabolizer. If so then neither is giving you any benefit. Let me know your thoughts? I am puzzled. Neda and Candise Bowens know the issue.   Thanks  Rica Mast E. Toni Amend, MD, FACP  [facility] [employer] Director [place] [facility]  Professor of Medicine [employer]

## 2015-12-24 ENCOUNTER — Ambulatory Visit: Payer: Medicare Other | Admitting: Internal Medicine

## 2015-12-24 ENCOUNTER — Encounter: Payer: Self-pay | Admitting: Internal Medicine

## 2015-12-24 ENCOUNTER — Other Ambulatory Visit: Payer: Self-pay | Admitting: Internal Medicine

## 2015-12-24 DIAGNOSIS — M797 Fibromyalgia: Secondary | ICD-10-CM

## 2015-12-24 MED ORDER — OXYCODONE-ACETAMINOPHEN 5-325 MG PO TABS
0.5000 | ORAL_TABLET | Freq: Four times a day (QID) | ORAL | Status: DC | PRN
Start: 2015-12-24 — End: 2016-03-07

## 2015-12-24 MED ORDER — OXYCODONE-ACETAMINOPHEN 5-325 MG PO TABS
0.5000 | ORAL_TABLET | Freq: Four times a day (QID) | ORAL | Status: DC | PRN
Start: 2015-12-24 — End: 2015-12-24

## 2015-12-24 NOTE — Progress Notes (Signed)
Scanned copy of prescription per Dr. Toni Amend.

## 2015-12-24 NOTE — Telephone Encounter (Signed)
Pt requests his chronic percocet refill. We have complied with all steps required by [facility] for prescribing chronic percocet while on clonazepam as well. I have once again asked pt to try and ween, but he states he has tried and cannot stop due to muscle pain. He reports no side effects related to either medication. He is now retired. He reports no accidents or injuries related to sedation. We will readdress once on [facility] to see if this helps his sxs in any way.

## 2015-12-25 NOTE — Progress Notes (Signed)
Subjective:       Patient ID: Shannon A. 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    HPI    Pt solely here for corrected percocet scripts. I wrote for only 30 tabs per script and should have been 60. Wrong scripts disposed of. Pt states he is taking med 100% and will test againa dn again, but not sure why labs coming up negative. Pt awaiting GH approval for def and feels better with hydrocortisone. He hopes to get off many meds once [facility] starts and hopefully feels better.     Review of Systems        Objective:    Physical Exam        Assessment:       Sololy above discussion- only 5 min and encounter opened to order scripts only-NO CHARGE visit.      Plan:      Procedures

## 2015-12-29 ENCOUNTER — Encounter: Payer: Self-pay | Admitting: Internal Medicine

## 2016-01-28 ENCOUNTER — Telehealth: Payer: Self-pay | Admitting: Internal Medicine

## 2016-01-28 DIAGNOSIS — J40 Bronchitis, not specified as acute or chronic: Secondary | ICD-10-CM

## 2016-01-28 DIAGNOSIS — R110 Nausea: Secondary | ICD-10-CM

## 2016-01-28 DIAGNOSIS — J4531 Mild persistent asthma with (acute) exacerbation: Secondary | ICD-10-CM

## 2016-01-28 MED ORDER — PROMETHAZINE HCL 25 MG PO TABS
25.0000 mg | ORAL_TABLET | Freq: Four times a day (QID) | ORAL | Status: DC | PRN
Start: 2016-01-28 — End: 2016-12-20

## 2016-01-28 MED ORDER — LEVOFLOXACIN 500 MG PO TABS
500.0000 mg | ORAL_TABLET | Freq: Every day | ORAL | Status: AC
Start: 2016-01-28 — End: 2016-02-07

## 2016-01-28 MED ORDER — PREDNISONE 10 MG PO TABS
ORAL_TABLET | ORAL | Status: DC
Start: 2016-01-28 — End: 2016-03-29

## 2016-01-28 NOTE — Telephone Encounter (Signed)
Pt needing prescriptions prior to vacation in case of potential infection. Has had RAD in past requiring steroids.

## 2016-01-28 NOTE — Telephone Encounter (Signed)
From: [name]   Sent: Wednesday, January 27, 2016 9:31 PM  To: Randolph Bing; [place], Sharlett Iles.  Subject: Fwd: update and favor    Can you all handle this for me. I am fine with this request.  Thx    Sent from [facility]  ---------- Forwarded message ----------  From: [name] [email or web address]  Date: January 27, 2016 at 8:53:12 PM EDT  Subject: update and favor  To: Norina Buzzard. [email or web address]>    [name],     I finally obtained the growth hormone and think hydrocortisone and growth hormone are beginning to help. The dosages need to be adjusted and it takes more time.   Favor please: We are going on vacation next week. My chest is starting to feel tight. Could I please get prescriptions - 1. prednisone 10 mg, # 42 [facility] said to use my regular dose of prednisone in this situation. I start at 60 mg in divided doses and wean down as quickly as possible. 2. Levaquin and 3. Phenergan 25 mg # 20 in case someone gets sick. This is my first real vacation in years and would like to enjoy it as much as possible.   [facility] [phone]     Thank you,     Akshith

## 2016-02-01 ENCOUNTER — Other Ambulatory Visit: Payer: Self-pay

## 2016-02-01 DIAGNOSIS — E291 Testicular hypofunction: Secondary | ICD-10-CM

## 2016-02-01 MED ORDER — TESTOSTERONE 50 MG/5GM (1%) TD GEL
TRANSDERMAL | Status: DC
Start: 2016-02-01 — End: 2016-06-01

## 2016-03-07 ENCOUNTER — Other Ambulatory Visit: Payer: Self-pay | Admitting: Internal Medicine

## 2016-03-07 ENCOUNTER — Encounter: Payer: Self-pay | Admitting: Internal Medicine

## 2016-03-07 DIAGNOSIS — M797 Fibromyalgia: Secondary | ICD-10-CM

## 2016-03-07 DIAGNOSIS — R5382 Chronic fatigue, unspecified: Secondary | ICD-10-CM

## 2016-03-07 MED ORDER — OXYCODONE-ACETAMINOPHEN 5-325 MG PO TABS
0.5000 | ORAL_TABLET | Freq: Four times a day (QID) | ORAL | Status: DC | PRN
Start: 2016-03-07 — End: 2016-06-02

## 2016-03-07 MED ORDER — OXYCODONE-ACETAMINOPHEN 5-325 MG PO TABS
0.5000 | ORAL_TABLET | Freq: Four times a day (QID) | ORAL | Status: DC | PRN
Start: 2016-03-07 — End: 2016-03-07

## 2016-03-07 MED ORDER — CLONAZEPAM 0.5 MG PO TABS
0.5000 mg | ORAL_TABLET | Freq: Four times a day (QID) | ORAL | Status: DC | PRN
Start: 2016-03-07 — End: 2016-06-02

## 2016-03-07 NOTE — Telephone Encounter (Signed)
Med refill request for percocet and clonazepam. Dates checked of last orders. Pt due. Pt will need repeat testing for drug content in system. Pt has no side effects. Now that he is on growth hormone we plan to try and wean these over the next several months and get him off of these chronic meds.

## 2016-03-16 ENCOUNTER — Encounter: Payer: Self-pay | Admitting: Internal Medicine

## 2016-03-18 ENCOUNTER — Other Ambulatory Visit: Payer: Self-pay

## 2016-03-18 DIAGNOSIS — G933 Postviral fatigue syndrome: Secondary | ICD-10-CM

## 2016-03-18 DIAGNOSIS — R400 Somnolence: Secondary | ICD-10-CM

## 2016-03-18 DIAGNOSIS — G9331 Postviral fatigue syndrome: Secondary | ICD-10-CM

## 2016-03-18 MED ORDER — MODAFINIL 100 MG PO TABS
ORAL_TABLET | ORAL | Status: DC
Start: 2016-03-18 — End: 2016-04-18

## 2016-03-18 NOTE — Telephone Encounter (Signed)
Call to patient.  Medication called in.  No refills as they will come from his pcp.  He will need to cancel his 8/1 appt.  I will contact his pcp and the staff.

## 2016-03-21 ENCOUNTER — Telehealth: Payer: Self-pay

## 2016-03-21 ENCOUNTER — Other Ambulatory Visit: Payer: Self-pay

## 2016-03-21 NOTE — Telephone Encounter (Signed)
Left appointment with patient - Dr, Toni Amend can meet patient at [facility] location only on Wednesday August 3 at 0900.  If patient prefers to do just labs, we can accommodate him at either property.

## 2016-03-22 ENCOUNTER — Ambulatory Visit: Payer: Medicare Other | Admitting: Internal Medicine

## 2016-03-25 ENCOUNTER — Other Ambulatory Visit: Payer: Self-pay

## 2016-03-29 ENCOUNTER — Encounter: Payer: Self-pay | Admitting: Internal Medicine

## 2016-03-29 ENCOUNTER — Ambulatory Visit (INDEPENDENT_AMBULATORY_CARE_PROVIDER_SITE_OTHER): Payer: Medicare Other | Admitting: Internal Medicine

## 2016-03-29 VITALS — BP 155/77 | HR 81 | Temp 98.0°F | Resp 16

## 2016-03-29 DIAGNOSIS — L308 Other specified dermatitis: Secondary | ICD-10-CM

## 2016-03-29 DIAGNOSIS — E782 Mixed hyperlipidemia: Secondary | ICD-10-CM

## 2016-03-29 DIAGNOSIS — F1120 Opioid dependence, uncomplicated: Secondary | ICD-10-CM

## 2016-03-29 DIAGNOSIS — G9332 Myalgic encephalomyelitis/chronic fatigue syndrome: Secondary | ICD-10-CM

## 2016-03-29 DIAGNOSIS — Z8639 Personal history of other endocrine, nutritional and metabolic disease: Secondary | ICD-10-CM

## 2016-03-29 DIAGNOSIS — Z79899 Other long term (current) drug therapy: Secondary | ICD-10-CM

## 2016-03-29 DIAGNOSIS — R21 Rash and other nonspecific skin eruption: Secondary | ICD-10-CM

## 2016-03-29 DIAGNOSIS — I10 Essential (primary) hypertension: Secondary | ICD-10-CM

## 2016-03-29 DIAGNOSIS — R5382 Chronic fatigue, unspecified: Secondary | ICD-10-CM

## 2016-03-29 DIAGNOSIS — E230 Hypopituitarism: Secondary | ICD-10-CM | POA: Insufficient documentation

## 2016-03-29 DIAGNOSIS — N182 Chronic kidney disease, stage 2 (mild): Secondary | ICD-10-CM

## 2016-03-29 DIAGNOSIS — L309 Dermatitis, unspecified: Secondary | ICD-10-CM

## 2016-03-29 HISTORY — DX: Dermatitis, unspecified: L30.9

## 2016-03-29 LAB — HIV AG/AB 4TH GENERATION: HIV Ag/Ab, 4th Generation: NONREACTIVE

## 2016-03-29 NOTE — Progress Notes (Signed)
Subjective:       Patient ID: Shannon A. 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    HPI  Patient comes in today just for labs requested from his [facility] endocrinologist and to review his current medical issues and sxs:    1. Fatigue feels worse- exhausted every 2 hours. On GH once daily July 1-60 days to 6 months per endo before a response. SXS can get worse. . One of the side effects is fatigue-hydrocortisone for the chronic pain and adrenal insuff per pt. Taking every 4 hours. Fatigue exacerbation was worse before starting GH. When fatigue worse then muscle pain gets worse. Looking into a steroid pump. ? Increase steroids- -is there another stressor. We reviewed how his untreated probable sleep apnea could be a big cause of his problems.     2. Terrible sleep-insomnia-needs in lab study-[facility]. Stress at sleep--it is brutal to get up in morning pt says. If adrenal insuff secondary to a central cause and he has sleep apnea then the stress of the sleep apnea could be causing much of his sxs. Note BP mildly above accepted baseline for age, but until sleep apnea treated not adjusting or adding a new med.     3. Right forearm maculopapular rash that appear mild-topical steroid trial. Does not appear infectious or like a drug eruption. If persists will refer to derm.    4. Right thumb-anhydrotic eczema and nail trauma from tile box landing on it. Discussed liberal use of lotions.     5. Breathing-every 20 min he says he gasps and no SOB. For about 3 to 6 months. Not voluntarily. Not associate with meds started or stopped. Suggested reviewing with pulmonary when he has his sleep apnea consultation.     6. STM-memory loss associated-GH may improve. -6 months-no daily activities impacted. Discussed neuro psych testing. Pt not having any issues with daily financial affairs or other daily activities related to memory.     7. Labs ordered per [facility] request.    8. HTN-150-160/diastolic ok. Ramipril 10mg  bid and norvasc 10mg  bid-needs  sleeps study-therapy adjustment if needed post this assessment/tx.     9. On vacation-was snorkling-been swimming-for exercise.     10. Dry eyes and dry mouth-restasis helps-local care. Med and underlying disease related.     11. Complete loss of libido- feels worse--no erection issue. Likely related to depression and medical issues. Discussed how ED meds won't help. Suggested CBT for this and overall stress. Pt said he will think about.     12. Left  arch-tight-just telling me-has not tried any stretches. Instructions for daily stretching of arch provided.     13. Due for MRI- [facility] or [facility]- MRI of brain with gadolinium-every 6 months-[facility]-due now. Asked about proton therapy-[place] building. Script given.        Review of Systems        Objective:    Physical Exam     Exam limited to right forearm and thumb for above issues as noted above.   Assessment:       See above by issue       Plan:     See above by issue.   Procedures

## 2016-03-29 NOTE — Progress Notes (Signed)
Urine collected - sent with labs to [facility].

## 2016-03-29 NOTE — Progress Notes (Signed)
Blood drawn from right arm, rightmost vein. Labs sent to [facility], [facility], and [facility]. Patient did well.    Patient gave verbal authorization to send HIV test to [facility] and not preferred lab or [facility].

## 2016-03-30 LAB — COMPREHENSIVE METABOLIC PANEL
ALT: 25 IU/L (ref 0–44)
AST (SGOT): 22 IU/L (ref 0–40)
Albumin/Globulin Ratio: 2.4 — ABNORMAL HIGH (ref 1.2–2.2)
Albumin: 4.8 g/dL (ref 3.6–4.8)
Alkaline Phosphatase: 39 IU/L (ref 39–117)
BUN / Creatinine Ratio: 24 (ref 10–24)
BUN: 29 mg/dL — ABNORMAL HIGH (ref 8–27)
Bilirubin, Total: 0.3 mg/dL (ref 0.0–1.2)
CO2: 24 mmol/L (ref 18–29)
Calcium: 9.8 mg/dL (ref 8.6–10.2)
Chloride: 102 mmol/L (ref 96–106)
Creatinine: 1.19 mg/dL (ref 0.76–1.27)
EGFR: 63 mL/min/{1.73_m2} (ref >59)
EGFR: 73 mL/min/{1.73_m2} (ref >59)
Globulin, Total: 2 g/dL (ref 1.5–4.5)
Glucose: 137 mg/dL — ABNORMAL HIGH (ref 65–99)
Potassium: 4.2 mmol/L (ref 3.5–5.2)
Protein, Total: 6.8 g/dL (ref 6.0–8.5)
Sodium: 143 mmol/L (ref 134–144)

## 2016-03-30 LAB — CBC AND DIFFERENTIAL
Baso(Absolute): 0 10*3/uL (ref 0.0–0.2)
Basos: 0 %
Eos: 1 %
Eosinophils Absolute: 0.1 10*3/uL (ref 0.0–0.4)
Hematocrit: 41 % (ref 37.5–51.0)
Hemoglobin: 13.4 g/dL (ref 12.6–17.7)
Immature Granulocytes Absolute: 0 10*3/uL (ref 0.0–0.1)
Immature Granulocytes: 0 %
Lymphocytes Absolute: 2.2 10*3/uL (ref 0.7–3.1)
Lymphocytes: 28 %
MCH: 29.8 pg (ref 26.6–33.0)
MCHC: 32.7 g/dL (ref 31.5–35.7)
MCV: 91 fL (ref 79–97)
Monocytes Absolute: 0.5 10*3/uL (ref 0.1–0.9)
Monocytes: 7 %
Neutrophils Absolute: 5 10*3/uL (ref 1.4–7.0)
Neutrophils: 64 %
Platelets: 300 10*3/uL (ref 150–379)
RBC: 4.5 x10E6/uL (ref 4.14–5.80)
RDW: 13.4 % (ref 12.3–15.4)
WBC: 7.8 10*3/uL (ref 3.4–10.8)

## 2016-03-30 LAB — PROLACTIN: Prolactin: 15 ng/mL (ref 4.0–15.2)

## 2016-03-30 LAB — C-REACTIVE PROTEIN: C-Reactive Protein: <0.3 mg/L (ref 0.0–4.9)

## 2016-03-30 LAB — RHEUMATOID FACTOR: RA Latex Turbid.: <10 IU/mL (ref 0.0–13.9)

## 2016-03-30 LAB — LYME AB, TOTAL,REFLEX TO WESTERN BLOT (IGG & IGM)
Lyme Disease Ab, Quant, IgM: <0.8 index (ref 0.00–0.79)
Lyme IgG/IgM Ab: <0.91 {ISR} (ref 0.00–0.90)

## 2016-03-30 LAB — T4, FREE: T4, Free: 1.48 ng/dL (ref 0.82–1.77)

## 2016-03-30 LAB — TSH: TSH: 0.343 u[IU]/mL — ABNORMAL LOW (ref 0.450–4.500)

## 2016-03-30 LAB — T4: T4, Total: 7.2 ug/dL (ref 4.5–12.0)

## 2016-03-30 LAB — CORTISOL: Cortisol: 12.6 ug/dL

## 2016-03-30 LAB — RPR: RPR: NONREACTIVE

## 2016-03-30 LAB — T3: T3, Total: 85 ng/dL (ref 71–180)

## 2016-04-04 LAB — CLONAZEPAM, URINE AS METABOLITE: Aminoclonazepam: 141 ng/ml

## 2016-04-11 ENCOUNTER — Other Ambulatory Visit: Payer: Self-pay

## 2016-04-11 DIAGNOSIS — E230 Hypopituitarism: Secondary | ICD-10-CM

## 2016-04-11 DIAGNOSIS — E039 Hypothyroidism, unspecified: Secondary | ICD-10-CM

## 2016-04-11 DIAGNOSIS — R739 Hyperglycemia, unspecified: Secondary | ICD-10-CM

## 2016-04-11 DIAGNOSIS — R5383 Other fatigue: Secondary | ICD-10-CM

## 2016-04-11 NOTE — Progress Notes (Signed)
Dr. Carrolyn Meiers office (endocrinologist from [facility]) called on 04/11/16 to inquire if any further lab tests needed to be repeated in the future - was advised that the patient did not have any future orders from Dr. Madie Reno.    From: [name]   Sent: Monday, April 11, 2016 9:20 AM  To: Nida Boatman; Amous, Neda M.  Cc: Uzziah Manske  Subject: Alhaji Mcmeekin-follow up labs    Based on recent labs I would like to repeat the following labs in about 3 months- early December  HbA1c  BMP-fasting  TSH and Free T4    We should check if [facility] wants any labs then as well.     Jamaal your tox screens finally both came back positive which meets the requirement.    As an FYI-if work related I do not feel we need to be checking the HIV and infectious disease exposure studies anymore since they have been negative and you are now retired.     Thanks  Rica Mast E. Toni Amend, MD, FACP  [facility] Director [place] [facility] [phone]

## 2016-04-18 ENCOUNTER — Telehealth: Payer: Self-pay

## 2016-04-18 DIAGNOSIS — G933 Postviral fatigue syndrome: Secondary | ICD-10-CM

## 2016-04-18 DIAGNOSIS — R400 Somnolence: Secondary | ICD-10-CM

## 2016-04-18 DIAGNOSIS — G9331 Postviral fatigue syndrome: Secondary | ICD-10-CM

## 2016-04-18 MED ORDER — MODAFINIL 100 MG PO TABS
ORAL_TABLET | ORAL | Status: DC
Start: 2016-04-18 — End: 2016-05-09

## 2016-04-18 NOTE — Telephone Encounter (Signed)
Called prescription into [facility] on file. Called Mr. Bollig and let him know the provigil was called in to his [facility].       From: [name]   Sent: Monday, April 18, 2016 10:48 AM  To: [name]; Randolph Bing  Subject: FW: Romaldo Lambert-follow up labs    Please see below. I am fine with the refill.   Thx     From: [name] [[email or web address]]   Sent: Monday, April 18, 2016 9:55 AM  To: [name].  Subject: Re: Eula Claassen-follow up labs    [place],    Could I please get refills for Provigil?    Am bumping and grinding along.  Still have not heard from [facility] and will contact them today.    Could I please get a copy of the blood work for my records?    Congratulations for being selected as a 2017 [employer]!    Thanks,    Binh  On Apr 11, 2016, at 12:55 PM, [name]. wrote:    Perfect and will do.     From: [name] [[email or web address]]   Sent: Monday, April 11, 2016 12:38 PM  To: [name].  Subject: Re: Gerson Ruben-follow up labs     [name],     Thanks for the update.  I will also keep an eye on my blood pressure.  I have not heard from [facility] but will call this week.  It was really nice that Lola tried to contact [facility].  Please thank her for me even though I know you asked her to do this.  Thank you.       I will schedule the MRI and have the report sent to you.  In addition, I will send a disc to [facility] and [facility] for their interpretation and recommendations.       Thanks again,     Taiden  On Apr 11, 2016, at 9:19 AM, [name]. wrote:      Based on recent labs I would like to repeat the following labs in about 3 months- early December  HbA1c  BMP-fasting  TSH and Free T4     We should check if [facility] wants any labs then as well.     Heron your tox screens finally both came back positive which meets the requirement.     As an FYI-if work related I do not feel we need to be checking the HIV and infectious disease exposure  studies anymore since they have been negative and you are now retired.     Thanks  Rica Mast E. Toni Amend, MD, FACP  [facility] Director [place] [address]  Professor of Medicine [employer]

## 2016-04-27 ENCOUNTER — Encounter: Payer: Self-pay | Admitting: Internal Medicine

## 2016-05-09 ENCOUNTER — Other Ambulatory Visit: Payer: Self-pay | Admitting: Internal Medicine

## 2016-05-09 DIAGNOSIS — R400 Somnolence: Secondary | ICD-10-CM

## 2016-05-09 DIAGNOSIS — G9331 Postviral fatigue syndrome: Secondary | ICD-10-CM

## 2016-05-09 DIAGNOSIS — E785 Hyperlipidemia, unspecified: Secondary | ICD-10-CM

## 2016-05-09 DIAGNOSIS — G933 Postviral fatigue syndrome: Secondary | ICD-10-CM

## 2016-05-09 DIAGNOSIS — J4540 Moderate persistent asthma, uncomplicated: Secondary | ICD-10-CM

## 2016-05-10 ENCOUNTER — Telehealth: Payer: Self-pay

## 2016-05-10 ENCOUNTER — Ambulatory Visit: Payer: Medicare Other | Attending: Internal Medicine

## 2016-05-10 DIAGNOSIS — J45909 Unspecified asthma, uncomplicated: Secondary | ICD-10-CM

## 2016-05-10 DIAGNOSIS — R0681 Apnea, not elsewhere classified: Secondary | ICD-10-CM

## 2016-05-10 DIAGNOSIS — D329 Benign neoplasm of meninges, unspecified: Secondary | ICD-10-CM

## 2016-05-10 DIAGNOSIS — I10 Essential (primary) hypertension: Secondary | ICD-10-CM

## 2016-05-10 DIAGNOSIS — R0683 Snoring: Secondary | ICD-10-CM

## 2016-05-10 DIAGNOSIS — G9331 Postviral fatigue syndrome: Secondary | ICD-10-CM

## 2016-05-10 DIAGNOSIS — G933 Postviral fatigue syndrome: Secondary | ICD-10-CM

## 2016-05-10 DIAGNOSIS — R400 Somnolence: Secondary | ICD-10-CM

## 2016-05-10 DIAGNOSIS — J4540 Moderate persistent asthma, uncomplicated: Secondary | ICD-10-CM

## 2016-05-10 MED ORDER — MODAFINIL 100 MG PO TABS
100.0000 mg | ORAL_TABLET | Freq: Three times a day (TID) | ORAL | 0 refills | Status: DC
Start: 2016-05-10 — End: 2016-12-20

## 2016-05-10 MED ORDER — ALBUTEROL SULFATE HFA 108 (90 BASE) MCG/ACT IN AERS
1.0000 | INHALATION_SPRAY | Freq: Four times a day (QID) | RESPIRATORY_TRACT | 2 refills | Status: DC | PRN
Start: 2016-05-10 — End: 2016-12-30

## 2016-05-10 NOTE — Telephone Encounter (Signed)
From: [name]   Sent: Tuesday, May 10, 2016 11:56 AM  To: [name]  Subject: RE: Provigil    THx    From: [name]   Sent: Tuesday, May 10, 2016 10:33 AM  To: [name].  Subject: RE: Provigil    Called and spoke with Dr. [name] regarding his medications. I will work on getting this sorted out. His [facility] is converting over his prescriptions to 90-day.    He also wanted me to let you know he is having his sleep study done tonight through his pulmonologist and will have his results faxed over to our office.     Boneta Lucks     From: [name]   Sent: Tuesday, May 10, 2016 9:32 AM  To: [name]  Subject: RE: Provigil    Yes and see if Provigil can be made 60 I guess to match up?      ---  Sent from [facility]  On May 10, 2016 at 9:28:52 AM EDT, Maye Hides, Rigoberto Noel.[email or web address] wrote:  Good morning,     Do you mean try to get all prescriptions on a 90 day refill, so they are due at the same time?  It looks like you put in a refill for the Provigil.     Boneta Lucks      From: [name]   Sent: Monday, May 09, 2016 5:50 PM  To: [name]  Subject: Fwd: Provigil     Please set scripts tomorrow as he wishes.  Thx      ---  Sent from [facility]  ---------- Forwarded message ----------  From: [name] [email or web address]  Date: May 09, 2016 at 5:38:37 PM EDT  Subject: Provigil  To: Norina Buzzard. [email or web address] [email or web address]>     [name],     I asked [facility] to contact your office since there are no refills. It is early and I don't need it early. It is a bit crazy when I refill my 30 day pill box. Prescriptions don't run out concurrently and some are 30 day and some 90 days.     Called [facility] again today. Hopefully he will call me this week.     Thanks,     Shannon [name]

## 2016-05-10 NOTE — Telephone Encounter (Signed)
Refills called in to [facility].

## 2016-05-11 NOTE — Miscellaneous (Unsigned)
Pt here for a split night study.  He has Fibromyalgia and chronic fatigue.  Pt says he doesn't sleep well.    It took him a bit to fall asleep.  He started to fall asleep supine and appeared to have 1 apnea which woke him and he proceeded to turn on his side and had no events.     He woke up around 2 am to use the restroom and tech encouraged him to sleep supine, which he did.  While supine he did have many events and some snoring.    PTAF not working on study, tech switched it out, switched out the head box to no avail.

## 2016-05-23 NOTE — Progress Notes (Unsigned)
NPSG results

## 2016-05-27 ENCOUNTER — Other Ambulatory Visit: Payer: Self-pay | Admitting: Internal Medicine

## 2016-05-27 DIAGNOSIS — M797 Fibromyalgia: Secondary | ICD-10-CM

## 2016-06-01 ENCOUNTER — Other Ambulatory Visit: Payer: Self-pay | Admitting: Internal Medicine

## 2016-06-01 DIAGNOSIS — E291 Testicular hypofunction: Secondary | ICD-10-CM

## 2016-06-02 ENCOUNTER — Telehealth: Payer: Self-pay | Admitting: Internal Medicine

## 2016-06-02 DIAGNOSIS — R5382 Chronic fatigue, unspecified: Secondary | ICD-10-CM

## 2016-06-02 DIAGNOSIS — M797 Fibromyalgia: Secondary | ICD-10-CM

## 2016-06-02 NOTE — Telephone Encounter (Signed)
Pt. Called for two reason.  First is rx refill (sent to our nurses)     Second.  He wanted you to know that he is having thesleep study next Monday the 16th.  He also has a follow up appt at [facility] Nov 13th.  Hopefully he will be able to wean off the two medications after the adrenal issue as been addressed.  If you need to speak with him, his cell is [phone].

## 2016-06-02 NOTE — Telephone Encounter (Signed)
Pt called and needs refill on percocet and klonopin.  Please escribe to the [facility].

## 2016-06-03 ENCOUNTER — Other Ambulatory Visit: Payer: Self-pay | Admitting: Internal Medicine

## 2016-06-03 MED ORDER — OXYCODONE-ACETAMINOPHEN 5-325 MG PO TABS
0.5000 | ORAL_TABLET | Freq: Four times a day (QID) | ORAL | 0 refills | Status: DC | PRN
Start: 2016-06-03 — End: 2016-07-01

## 2016-06-03 MED ORDER — CLONAZEPAM 0.5 MG PO TABS
0.5000 mg | ORAL_TABLET | Freq: Three times a day (TID) | ORAL | 1 refills | Status: DC | PRN
Start: 2016-06-03 — End: 2016-07-01

## 2016-06-05 ENCOUNTER — Other Ambulatory Visit: Payer: Self-pay | Admitting: Internal Medicine

## 2016-06-05 DIAGNOSIS — D329 Benign neoplasm of meninges, unspecified: Secondary | ICD-10-CM

## 2016-06-06 ENCOUNTER — Ambulatory Visit: Payer: Medicare Other | Attending: Internal Medicine

## 2016-06-06 DIAGNOSIS — G4710 Hypersomnia, unspecified: Secondary | ICD-10-CM

## 2016-06-06 DIAGNOSIS — G4700 Insomnia, unspecified: Secondary | ICD-10-CM | POA: Insufficient documentation

## 2016-06-06 DIAGNOSIS — G4733 Obstructive sleep apnea (adult) (pediatric): Secondary | ICD-10-CM | POA: Insufficient documentation

## 2016-06-06 DIAGNOSIS — I10 Essential (primary) hypertension: Secondary | ICD-10-CM | POA: Insufficient documentation

## 2016-06-07 ENCOUNTER — Ambulatory Visit: Payer: Medicare Other | Attending: Internal Medicine

## 2016-06-07 DIAGNOSIS — H748X2 Other specified disorders of left middle ear and mastoid: Secondary | ICD-10-CM | POA: Insufficient documentation

## 2016-06-07 DIAGNOSIS — D329 Benign neoplasm of meninges, unspecified: Secondary | ICD-10-CM

## 2016-06-07 DIAGNOSIS — D320 Benign neoplasm of cerebral meninges: Secondary | ICD-10-CM | POA: Insufficient documentation

## 2016-06-07 MED ORDER — GADOBUTROL 1 MMOL/ML IV SOLN
8.0000 mL | Freq: Once | INTRAVENOUS | Status: AC | PRN
Start: 2016-06-07 — End: 2016-06-07
  Administered 2016-06-07: 8 mmol via INTRAVENOUS
  Filled 2016-06-07: qty 10

## 2016-06-07 NOTE — Progress Notes (Signed)
CPAP of adequate duration completed.

## 2016-06-07 NOTE — Progress Notes (Signed)
Addendum created to charge for study.

## 2016-06-07 NOTE — Miscellaneous (Signed)
Pt here for a CPAP titration.      The study was started without CPAP at first, because the pt is complaining of having some episodes of needing to catch his breath and he was hoping we would see it.      Upon lights out, cpap was applied and pt tolerated it well.     Unfortunately he was awake a lot of the night, and would have a snack.  He states this is normal for him.    He had slight snoring early in the night, but once titrated had no snoring and no disordered breathing.

## 2016-06-16 ENCOUNTER — Telehealth: Payer: Self-pay

## 2016-06-16 NOTE — Telephone Encounter (Signed)
Called and spoke with Dr. [name]. He has the results of his sleep study, however he needs CPAP machine and supplies. Has not heard from Dr. Joslyn Devon office on what the next step is.    Called Dr. Joslyn Devon office and the coordinator for the sleep study supplies will give the patient a call to get everything set up. Patient cell phone number left for the call back.         From: [name]   Sent: Thursday, June 16, 2016 2:04 PM  To: [name]  Subject: Re: R. Marotto- sleep study    If in his chart I will review and yes please help him set up a follow up consultation for him to review with their doc. Or ask Para March to. Asap.  Thx      ---  Sent from [facility]  On June 16, 2016 at 12:05:50 PM EDT, Arta Stump, Rigoberto Noel.[email or web address] wrote:  Hi Dr. Toni Amend,     I have a copy of the sleep study in Mr. Sweeden chart from 06/06/2016. It is 12 pages long, but I attached the first 2 pages incase you wanted to take a look. Would you like me to call Dr. Joslyn Devon office and see why the results haven't been discussed with the patient?    [name], RN, BSN   [facility] [address], [place], [place] [address]  T [phone]  [name] [phone]           This communication may contain confidential and/or privileged information. Additionally, this communication may contain protected health information (PHI) that is legally protected from inappropriate disclosure by the Privacy Standards of the [employer] Portability and Accountability Act (HIPAA) and relevant [employer]. If you are not the intended recipient, please note that any dissemination, distribution or copying of this communication is strictly prohibited. If you have received this message in error, you should notify the sender immediately by telephone or by return e-mail and delete this message from your computer. Direct questions to the Personal assistant at [phone].              My team  can get that answer/report for [place].  Thanks  [name]    -----Original Message-----  From: [name] [[email or web address]]   Sent: Thursday, June 16, 2016 11:26 AM  To: [name]  Subject: CPAP sleep study    Never heard from Vaid about the CPAP follow-up sleep study.  I will call him.  No need for you do anything.

## 2016-06-17 NOTE — Telephone Encounter (Signed)
Called Dr. Joslyn Devon office and the coordinator said she spoke with Dr. [name] yesterday and gave him all of the information needed regarding his CPAP supplies.    Called Dr. [name] to check in and he needed the information from yesterday again. The company that will being supplying the CPAP supplies is [employer] and their number is [phone]. This information was given to Dr. Reymundo Poll wife, per patient's request. Durward Mallard will be calling the patient to get everything set up.

## 2016-06-23 NOTE — Telephone Encounter (Signed)
From: [name]   Sent: Friday, June 17, 2016 5:39 PM  To: [name]  Subject: RE: Shannon [name]- sleep study    Good for me to know.  Thx      ---  Sent from [facility]  On June 17, 2016 at 3:39:16 PM EDT, Shelley Cocke, Rigoberto Noel.[email or web address] wrote:  Hi Dr. Toni Amend,     Just an FYI- I called Dr. [name] today to make sure that Dr. Joslyn Devon office had gotten in touch with him regarding the CPAP supplies. Dr. [name] said he had not heard anything, so I called the office and spoke with the coordinator and she said she had in fact spoken with the patient yesterday.      I am not very familiar with Dr. Rosanne Ashing history, but I thought it was a little strange that he had forgotten such a major conversation.      I called Dr. [name] back and discussed with him, but he didn't say much. I told him [employer] will be providing the CPAP supplies. He wanted me to give that information to his wife, so I spoke with her was well and gave her the contact info for [employer].     I hope the boards went well today.     [name], RN, BSN   [facility] [address], [place], [place] [address]  T [phone]  F [phone]

## 2016-06-30 ENCOUNTER — Telehealth: Payer: Self-pay

## 2016-06-30 NOTE — Telephone Encounter (Signed)
From: [name]   Sent: Thursday, June 30, 2016 1:11 PM  To: [name]  Subject: Re: Percocet and Clonazepam    Shannon [name],  As you know I have always been there for you and I would not wish the discomfort you have on anyone. Nor can I fully understand or relate to your actual feelings inside.  I am honored to care for you my colleague, but after reconfirming with an extensive literature review and consulting with experts in rheumatology and pharmacology I can no longer justify writing for the Percocet or clonazepam as a treatment for your fibromyalgia. I must defer these scripts to rheumatology if and when they feel they are appropriate. I do not believe they will. As shared I do feel that in addition to your complex array of medical issues that the polypharmacy is playing a role. If you feel the pain is too bad then I will recommend a hospital admission if it reaches that point, but also to relook with rheum and Dr. Scherry Ran to see if there is another reason for this painful muscle syndrome besides fibromyalgia. Lastly, while I do not know one well it is probably best to have you see a pain specialist if you remain in such pain and dissatisfied with my opinion and decision.     I am here for you and here to speak with any consultants. I do think you should get on the books of the [facility] endo doc who has the pituitary expertise and I did ask Dr. Tamera Punt to see if he can advocate for you as a colleague to get in sooner than February.     Thanks  Shannon [name]  (Scan)      ---  Rhina Brackett from [facility]  On June 29, 2016 at 7:14:28 PM EST, Shannon [name] [email or web address]> wrote:  Shannon [name],     I understand that it would be good if the Percocet and Clonazepam can be stopped. Hopefully, [facility] will be able to optimize treatment for AI. Once accomplished, then I can start to taper. Thus far it has been impossible and I   continue to function in a very limited way. I have decreased the Clonazepam to 1/2 tab TID successfully  and will continue to try and slowly taper.     I have taken copious amounts of anti-inflammatories, tylenol and aspirin without any relief. Interestingly, Ibuprofin with the hanging is helping the back pain.     I shall make the appointments with [facility] December and Psych when I return. Now I am trying to get organized before leaving for [facility].     Would you be amenable to one refill of Percocet especially since you are going on vacation?     Thanks,     Shannon [name]

## 2016-07-01 ENCOUNTER — Telehealth: Payer: Self-pay | Admitting: Internal Medicine

## 2016-07-01 ENCOUNTER — Other Ambulatory Visit: Payer: Self-pay | Admitting: Internal Medicine

## 2016-07-01 DIAGNOSIS — M797 Fibromyalgia: Secondary | ICD-10-CM

## 2016-07-01 MED ORDER — TRAMADOL HCL 50 MG PO TABS
ORAL_TABLET | ORAL | 0 refills | Status: DC
Start: 2016-07-01 — End: 2016-12-20

## 2016-07-01 NOTE — Telephone Encounter (Signed)
Patient informed I will no longer refill his percocet, clonazepam, or provigil as there are no clear disease indications. Pt upset and feels he needs these meds for his CF/FMS. I advised the patient that he will need to have either rheumatology or a pain specialist provide if they feel they are indicated. The patient has shown no evidence of abuse, but also has been on these meds for several years and has shown no benefit in my opinion. The patient is currently being treated for panhypopit and adrenal insuff, hypogonadism, and hypothyroidism as a result, as wellas, growth hormone deficiency. Further analysis suggests polypharmacy with potential drug interactions. The pt knows how to wean clonazepam and has asked for 30days of tramadol to wean from the percocet. Note he is only on a half tablet of percocet qid. As a result will provide only 25mg  tabs to start qday and increase every third day to bid and tid max for 30 days total. No refills will be provided. Pt is aware.

## 2016-07-04 NOTE — Telephone Encounter (Signed)
From: [name]   Sent: Monday, June 27, 2016 12:24 PM  To: [name]  Cc: Ralene Muskrat.; Randolph Bing  Subject: FW: drug interaction report    Jalien,  Take a read of the attached. Very interesting in how some of your meds could be making things worse. I think we need to clean up as much as we can for those that are not hormonal replacement related. This plus CPAP. Plus psychiatry support are all steps in the right direction. I also wonder if it is time to go/go back to [facility]--can't recall if you saw him, but to see via EMG or biopsy if there is anything else going on with the muscles. Let me know your thoughts?  Thanks  [name]  (scan)      -------------------      Drug Interaction Report  Drug interactions for the following 17 drug(s):  My Interactions List (Unsaved)   acetaminophen / oxycodone   amlodipine  aspirin   clonazepam   duloxetine   ezetimibe   fenofibrate   finasteride   modafinil   montelukast   promethazine   ramipril   simvastatin  testosterone topical  hydrocortisone  levothyroxine  somatropin        For the above 17 drugs, we performed pairwise search for known (previously reported) interactions.  These are classified as Major, Moderate, or Minor.  We only include the Major and Moderate interactions as they have been actually observed (Minor can be based on in vitro data alone).  Note that the computer algorithm for detecting interactions is based solely on a pairwise analysis.  If a patient is adhering to a 17 drug regimen, there is the possibility that some of these interactions could be amplified or cancelled by the presence of third, fourth, etc. drugs.  Additionally, this analysis is based primarily on interactions at the level of specific drug metabolizing enzymes or known overlapping pharmacodynamic effects.  Adverse effects or drug clearance could be amplified by competition at the level of transporter molecules that play a role in absorption, disposition, and  elimination.  Similarly, with multiple drugs co-administered, competitive binding of circulating proteins such as albumin and a-1 anti-trypsin can lead to increased free concentrations of the displaced compounds and associated increases in adverse effects or increased clearance and diminished efficacy.    Although the interactions below have been described and purported mechanisms identified, they do not have significant effects in every patient.  Some of the concerns raised have countervailing evidence, so they are raised as a starting point for further investigation.  Others listed were likely already considered in the initial administration and dose adjustments made by the prescribers.  Others might have been managed over short periods of time, but emerged as significant only after prolonged dosing and development of cumulative, significant effects.  Decisions regarding the dose and continuation/discontinuation of the medications are best made by the treating physician and patient.    Interactions between your selected drugs  Major  clonazepam oxycodone  Applies to: clonazepam, acetaminophen / oxycodone  GENERALLY AVOID: Concomitant use of opioids with benzodiazepines or other central nervous system (CNS) depressants (e.g., nonbenzodiazepine sedatives/hypnotics, anxiolytics, muscle relaxants, general anesthetics, antipsychotics, other opioids, alcohol) may result in profound sedation, respiratory depression, coma, and death. The risk of hypotension may also be increased with some CNS depressants (e.g., alcohol, benzodiazepines, phenothiazines).    MANAGEMENT: The use of opioids in conjunction with benzodiazepines or other CNS depressants should generally be avoided unless  alternative treatment options are inadequate. If coadministration is necessary, the dosage and duration of each drug should be limited to the minimum required to achieve desired clinical effect. Patients should be monitored closely for signs and  symptoms of respiratory depression and sedation, and advised to avoid driving or operating hazardous machinery until they know how these medications affect them. Cough medications containing opioids (e.g., codeine, hydrocodone) should not be prescribed to patients using benzodiazepines or other CNS depressants including alcohol. For patients who have been receiving extended therapy with both an opioid and a benzodiazepine and require discontinuation of either medication, a gradual tapering of dose is advised, since abrupt withdrawal may lead to withdrawal symptoms. Severe cases of benzodiazepine withdrawal, primarily in patients who have received excessive doses over a prolonged period, may result in numbness and tingling of extremities, hypersensitivity to light and noise, hallucinations, and epileptic seizures.  References  1. [place] Food and Drug Administration "FDA warns about serious risks and death when combining opioid pain or cough medicines with benzodiazepines; requires its strongest warning. Available from: URL: PubAddiction.co.nz.pdf." ([2016, Aug 31]):  simvastatin fenofibrate  Applies to: simvastatin, fenofibrate  GENERALLY AVOID: Severe myopathy and rhabdomyolysis have been reported during concomitant use of HMG-CoA reductase inhibitors and fibric acid derivatives, especially gemfibrozil. Gemfibrozil has been reported to significantly increase the plasma concentrations of some HMG-CoA reductase inhibitors and/or their active metabolites, including lovastatin, simvastatin, pravastatin, cerivastatin, and rosuvastatin (but not fluvastatin). High levels of HMG-CoA reductase inhibitory activity in plasma is associated with an increased risk of musculoskeletal toxicity. Myopathy manifested as muscle pain and/or weakness associated with grossly elevated creatine kinase exceeding ten times the upper limit of normal has been reported occasionally. Rhabdomyolysis has also  occurred rarely, which may be accompanied by acute renal failure secondary to myoglobinuria and may result in death. Other fibrates have not been shown to significantly affect the pharmacokinetics of HMG-CoA reductase inhibitors. However, the use of fibrates alone has also been associated with development of myopathy, thus a pharmacodynamic interaction could conceivably occur.    MANAGEMENT: Concurrent use of fibric acid derivatives and HMG-CoA reductase inhibitors should generally be avoided unless the benefit of further alterations in lipid levels is anticipated to outweigh the potential risks. Addition of fibrates to HMG-CoA reductase inhibitor therapy typically provides little additional reduction in LDL cholesterol, but further reductions of triglycerides and increases in HDL cholesterol may be attained. If the combination is prescribed, a fibrate other than gemfibrozil may be preferable, along with lower initial dosages of the HMG-CoA reductase inhibitor. If gemfibrozil is used, rosuvastatin daily dosage should not exceed 10 mg. Lovastatin labeling recommends that the dosage not exceed 20 mg daily when prescribed with gemfibrozil or other fibrates. All patients treated with HMG-CoA reductase inhibitors and/or fibrates should be advised to promptly report any unexplained muscle pain, tenderness or weakness, particularly if accompanied by fever, malaise and/or dark colored urine. Therapy should be discontinued if creatine kinase is markedly elevated in the absence of strenuous exercise or if myopathy is otherwise suspected or diagnosed. In addition, patients should be closely monitored for hepatotoxicity.    J Clin Pharmacol. 2014 Sep;54(9):1038-47. doi: 10.1002/jcph.291. Epub 2014 Apr 1.  Pharmacokinetic interaction between simvastatin and fenofibrate with staggered and simultaneous dosing: Does it matter?  [place] A1, Ansquer JC, Olbrich M, Zenaida Niece [address] [place] Kerby Moors Ass[place] H,  Piskol G, Mutual D, Mihara K.  Author information  Abstract  Simvastatin and fenofibrate are frequently co-prescribed at staggered intervals for  the treatment of dyslipidemia. Since a drug-drug interaction has been reported when the two drugs are given simultaneously, it is of clinical interest to know whether the interaction differs between simultaneous and staggered combinations. A study, assessing the impact of both combinations on the interaction, was conducted with 7-day treatment regimens using simvastatin 40?mg and fenofibrate 145?mg: (A) simvastatin only (evening), (B) simvastatin and fenofibrate (both in evening), and (C) simvastatin (evening) and fenofibrate (morning). Eighty-five healthy subjects received the respective treatments in a randomized, 3-way cross-over study. The pharmacokinetics of simvastatin and the active metabolite simvastatin acid were determined. There was a limited reduction in the AUC0-24h of simvastatin acid of 21 and 29% for simultaneous and staggered combination, respectively. The geometric mean AUC0-24h ratio of simvastatin acid for the two combined dosing regimens (B/C) and 90% confidence interval were 111% (102-121). The interaction apparently had no impact on lipid markers. The findings imply that the observed pharmacokinetic interaction is unlikely clinically relevant, and support the combined use of simvastatin and fenofibrate not only given at staggered interval but also given simultaneously.     References  1. "Product Information. Zocor (simvastatin)." [employer], [place], [place], [place].   2. Whitehawk, Shana Chute C, Deeg MA "Simvastatin, fenofibrate, and rhabdomyolysis." Diabetes Care 28 (2005): 1258  3. Unal A, Torun E, Sipahioglu MH, et al. "Fenofibrate-induced acute renal failure due to massive rhabdomyolysis after coadministration of statin in two patients." Intern Med 47 (2008): 1017-9  4. J Clin Pharmacol. 2004 Sep;44(9):1054-62.  Simvastatin does not have a  clinically significant pharmacokinetic interaction with fenofibrate in humans.  Lorelee New, Debera Lat, Lasseter Johnson, He W, Pr[place] T, Artemio Aly, [place] A, Vega JM, Paolini JF.  Abstract  Simvastatin and fenofibrate are both commonly used lipid-regulating agents with distinct mechanisms of action, and their coadministration may be an attractive treatment for some patients with dyslipidemia. A 2-period, randomized, open-label, crossover study was conducted in 12 subjects to determine if fenofibrate and simvastatin are subject to a clinically relevant pharmacokinetic interaction at steady state. In treatment A, subjects received an 80-mg simvastatin tablet in the morning for 7 days. In treatment B, subjects received a 160-mg micronized fenofibrate capsule in the morning for 7 days, followed by a 160-mg micronized fenofibrate capsule dosed together with an 80-mg simvastatin tablet on days 8 to 14. Because food increases the bioavailability of fenofibrate, each dose was administered with food to maximize the exposure of fenofibric acid. The steady-state pharmacokinetics (AUC(0-24h), C(max), and t(max)) of active and total HMG-CoA reductase inhibitors, simvastatin acid, and simvastatin were determined following simvastatin administration with and without fenofibrate. Also, fenofibric acid steady-state pharmacokinetics were evaluated with and without simvastatin. The geometric mean ratios (GMRs) for AUC(0-24h) (80 mg simvastatin [SV] + 160 mg fenofibrate)/(80 mg simvastatin alone) and 90% confidence intervals (CIs) were 0.88 (0.80, 0.95) and 0.92 (0.82, 1.03) for active and total HMG-CoA reductase inhibitors. The GMRs and 90% CIs for fenofibric acid (80 mg SV + 160 mg fenofibrate/160 mg fenofibrate alone) AUC(0-24h) and C(max) were 0.95 (0.88, 1.04) and 0.89 (0.77, 1.02), respectively. Because both the active inhibitor and fenofibric acid AUC GMR 90% confidence intervals fell within  the prespecified bounds of (0.70, 1.43), no clinically significant pharmacokinetic drug interaction between fenofibrate and simvastatin was concluded in humans. The coadministration of simvastatin and fenofibrate in this study was well tolerated.    oxycodone modafinil  Applies to: acetaminophen / oxycodone, modafinil  MONITOR CLOSELY: Coadministration with inducers  of CYP450 3A4 may decrease the plasma concentrations of opioids that are primarily metabolized by the isoenzyme such as butorphanol, fentanyl, hydrocodone, and oxycodone. Reduced efficacy or withdrawal symptoms may occur in patients maintained on their narcotic pain regimen following the addition of a CYP450 3A4 inducer. Conversely, discontinuation of the inducer may increase opioid plasma concentrations and potentiate the risk of overdose and fatal respiratory depression.    MANAGEMENT: Pharmacologic response to the opioid should be monitored more closely whenever a CYP450 3A4 inducer is added to or withdrawn from therapy, and the opioid dosage adjusted as necessary.  References  1. "Product Information. Mycobutin (rifabutin)." [employer], [place], [place].   2. "Product Information. Rifadin (rifampin)." [employer], [place], [place].   3. "Biochemist, clinical. Ionsys (fentanyl)." [employer], [place], [place].   4. "Product Information. OxyContin (oxycodone)." [employer], [place], [place].   5. "Product Information. Butorphanol Tartrate (butorphanol)." [employer], [place], [place].   6. [employer] "e-CPS. Available from: URL: http://www.pharmacists.ca/function/Subscriptions/ecps.cfm?link=eCPS_quikLink."   7. [employer], [employer]. "[employer]." O 0  8. Counselling psychologist. Zohydro ER (HYDROcodone)." [employer], [place], [place].   9. [employer], [employer]. "[place] Summary of Product Characteristics." O 0  10. "Product Information. Duragesic Transdermal System (fentanyl)."  [employer], [place], [place].     amlodipine simvastatin  Applies to: amlodipine, simvastatin  ADJUST DOSE: Coadministration with amlodipine may significantly increase the plasma concentrations of simvastatin and its active metabolite, simvastatin acid, and potentiate the risk of statin-induced myopathy. The proposed mechanism is amlodipine inhibition of simvastatin metabolism via intestinal and hepatic CYP450 3A4. When a single 80 mg dose of simvastatin was administered on day 10 of amlodipine given at a dosage of 10 mg once daily, simvastatin peak plasma concentration (Cmax) and systemic exposure (AUC) increased by an average of 1.5- and 1.8-fold, respectively, while simvastatin acid Cmax and AUC increased by an average of 1.6-fold each. High levels of statin or HMG-CoA reductase inhibitory activity in plasma is associated with an increased risk of musculoskeletal toxicity. Myopathy manifested as muscle pain and/or weakness associated with grossly elevated creatine kinase exceeding ten times the upper limit of normal has been reported occasionally. Rhabdomyolysis has also occurred rarely, which may be accompanied by acute renal failure secondary to myoglobinuria and may result in death.    MANAGEMENT: Simvastatin dosage should not exceed 20 mg daily when used in combination with amlodipine. The benefits of this combination should be carefully weighed against the potentially increased risk of myopathy including rhabdomyolysis. Fluvastatin, pravastatin, and rosuvastatin are probably safer alternatives in patients receiving amlodipine, since they are not metabolized by CYP450 3A4. All patients receiving statin therapy should be advised to promptly report any unexplained muscle pain, tenderness or weakness, particularly if accompanied by fever, malaise and/or dark colored urine. Therapy should be discontinued if creatine kinase is markedly elevated in the absence of strenuous exercise or if myopathy is  otherwise suspected or diagnosed.  References  1. "Product Information. Zocor (simvastatin)." [employer], [employer], [place], [place].

## 2016-08-05 ENCOUNTER — Encounter: Payer: Self-pay | Admitting: Internal Medicine

## 2016-08-09 ENCOUNTER — Other Ambulatory Visit: Payer: Self-pay | Admitting: Internal Medicine

## 2016-08-09 DIAGNOSIS — E782 Mixed hyperlipidemia: Secondary | ICD-10-CM

## 2016-08-22 DIAGNOSIS — Z1159 Encounter for screening for other viral diseases: Secondary | ICD-10-CM

## 2016-08-22 HISTORY — DX: Encounter for screening for other viral diseases: Z11.59

## 2016-09-13 ENCOUNTER — Other Ambulatory Visit: Payer: Self-pay | Admitting: Internal Medicine

## 2016-09-13 DIAGNOSIS — M797 Fibromyalgia: Secondary | ICD-10-CM

## 2016-09-13 DIAGNOSIS — E291 Testicular hypofunction: Secondary | ICD-10-CM

## 2016-09-13 DIAGNOSIS — G9332 Myalgic encephalomyelitis/chronic fatigue syndrome: Secondary | ICD-10-CM

## 2016-09-13 DIAGNOSIS — E781 Pure hyperglyceridemia: Secondary | ICD-10-CM

## 2016-09-13 DIAGNOSIS — E230 Hypopituitarism: Secondary | ICD-10-CM

## 2016-09-13 DIAGNOSIS — Z125 Encounter for screening for malignant neoplasm of prostate: Secondary | ICD-10-CM

## 2016-09-13 DIAGNOSIS — R5382 Chronic fatigue, unspecified: Secondary | ICD-10-CM

## 2016-09-14 ENCOUNTER — Encounter: Payer: Self-pay | Admitting: Internal Medicine

## 2016-09-14 ENCOUNTER — Ambulatory Visit: Payer: Medicare Other

## 2016-09-14 ENCOUNTER — Other Ambulatory Visit: Payer: Medicare Other

## 2016-09-14 DIAGNOSIS — E291 Testicular hypofunction: Secondary | ICD-10-CM

## 2016-09-14 DIAGNOSIS — Z125 Encounter for screening for malignant neoplasm of prostate: Secondary | ICD-10-CM

## 2016-09-14 DIAGNOSIS — M797 Fibromyalgia: Secondary | ICD-10-CM

## 2016-09-14 DIAGNOSIS — E230 Hypopituitarism: Secondary | ICD-10-CM

## 2016-09-14 DIAGNOSIS — E781 Pure hyperglyceridemia: Secondary | ICD-10-CM

## 2016-09-14 DIAGNOSIS — R5382 Chronic fatigue, unspecified: Secondary | ICD-10-CM

## 2016-09-15 ENCOUNTER — Ambulatory Visit (FREE_STANDING_LABORATORY_FACILITY): Payer: Medicare Other

## 2016-09-15 ENCOUNTER — Other Ambulatory Visit: Payer: Medicare Other

## 2016-09-15 DIAGNOSIS — E781 Pure hyperglyceridemia: Secondary | ICD-10-CM

## 2016-09-15 DIAGNOSIS — Z125 Encounter for screening for malignant neoplasm of prostate: Secondary | ICD-10-CM

## 2016-09-15 DIAGNOSIS — M797 Fibromyalgia: Secondary | ICD-10-CM

## 2016-09-15 DIAGNOSIS — E039 Hypothyroidism, unspecified: Secondary | ICD-10-CM

## 2016-09-15 DIAGNOSIS — Z114 Encounter for screening for human immunodeficiency virus [HIV]: Secondary | ICD-10-CM

## 2016-09-15 DIAGNOSIS — R5382 Chronic fatigue, unspecified: Secondary | ICD-10-CM

## 2016-09-15 DIAGNOSIS — E291 Testicular hypofunction: Secondary | ICD-10-CM

## 2016-09-15 DIAGNOSIS — E230 Hypopituitarism: Secondary | ICD-10-CM

## 2016-09-15 LAB — HIGH SENSITIVITY CRP: C-Reactive Protein, High Sensitive: 0.28 mg/L (ref 0.00–3.00)

## 2016-09-15 LAB — TESTOSTERONE: Testosterone: 536 ng/dL (ref 264–916)

## 2016-09-15 LAB — COMPREHENSIVE METABOLIC PANEL
ALT: 25 IU/L (ref 0–44)
AST (SGOT): 21 IU/L (ref 0–40)
Albumin/Globulin Ratio: 2 (ref 1.2–2.2)
Albumin: 4.5 g/dL (ref 3.6–4.8)
Alkaline Phosphatase: 36 IU/L — ABNORMAL LOW (ref 39–117)
BUN / Creatinine Ratio: 21 (ref 10–24)
BUN: 25 mg/dL (ref 8–27)
Bilirubin, Total: 0.3 mg/dL (ref 0.0–1.2)
CO2: 25 mmol/L (ref 18–29)
Calcium: 9.4 mg/dL (ref 8.6–10.2)
Chloride: 104 mmol/L (ref 96–106)
Creatinine: 1.19 mg/dL (ref 0.76–1.27)
EGFR: 63 mL/min/{1.73_m2} (ref >59)
EGFR: 73 mL/min/{1.73_m2} (ref >59)
Globulin, Total: 2.3 g/dL (ref 1.5–4.5)
Glucose: 108 mg/dL — ABNORMAL HIGH (ref 65–99)
Potassium: 3.9 mmol/L (ref 3.5–5.2)
Protein, Total: 6.8 g/dL (ref 6.0–8.5)
Sodium: 143 mmol/L (ref 134–144)

## 2016-09-15 LAB — TSH: TSH: 0.635 u[IU]/mL (ref 0.450–4.500)

## 2016-09-15 LAB — SEDIMENTATION RATE: Sed Rate: 2 mm/hr (ref 0–30)

## 2016-09-15 LAB — HIV AG/AB 4TH GENERATION: HIV Ag/Ab, 4th Generation: NONREACTIVE

## 2016-09-15 LAB — OSMOLALITY: Osmolality Meas: 295 mOsmol/kg (ref 280–301)

## 2016-09-15 LAB — SEX HORMONE BINDING GLOBULIN: Sex Hormone Binding: 25.2 nmol/L (ref 19.3–76.4)

## 2016-09-15 LAB — PROLACTIN: Prolactin: 14.8 ng/mL (ref 4.0–15.2)

## 2016-09-15 LAB — PSA: Prostate Specific Antigen, Total: 0.6 ng/mL (ref 0.0–4.0)

## 2016-09-15 LAB — INSULIN-LIKE GROWTH FACTOR: Insulin-like Growth Factor 1: 243 ng/mL — ABNORMAL HIGH (ref 47–192)

## 2016-09-15 LAB — LIPID PANEL, WITHOUT TOTAL CHOLESTEROL/HDL RATIO, SERUM
Cholesterol: 189 mg/dL (ref 100–199)
HDL: 81 mg/dL (ref >39)
LDL Calculated: 86 mg/dL (ref 0–99)
Triglycerides: 112 mg/dL (ref 0–149)
VLDL Calculated: 22 mg/dL (ref 5–40)

## 2016-09-15 LAB — HEMOGLOBIN A1C: Hemoglobin A1C: 5.8 % — ABNORMAL HIGH (ref 4.8–5.6)

## 2016-09-15 LAB — ACTH: Adrenocorticotropic hormone (ACTH): 3.9 pg/mL — ABNORMAL LOW (ref 7.2–63.3)

## 2016-09-15 LAB — RHEUMATOID FACTOR: RA Latex Turbid.: 11.3 IU/mL (ref 0.0–13.9)

## 2016-09-15 LAB — T4, FREE: T4, Free: 1.51 ng/dL (ref 0.82–1.77)

## 2016-09-15 LAB — T3: T3, Total: 72 ng/dL (ref 71–180)

## 2016-09-15 LAB — CORTISOL (AM): Cortisol - AM: 14.9 ug/dL (ref 6.2–19.4)

## 2016-09-16 LAB — TESTOSTERONE, FREE, DIRECT: Testosterone, Free: 8.2 pg/mL (ref 6.6–18.1)

## 2016-09-16 LAB — CORTISOL: Cortisol: 23 ug/dL

## 2016-09-16 LAB — HEPATITIS C ANTIBODY: HCV AB: <0.1 s/co ratio (ref 0.0–0.9)

## 2016-09-16 LAB — RPR: RPR: NONREACTIVE

## 2016-09-17 LAB — PLASMA RENIN ACTIVITY: Renin: 4.485 ng/mL/hr (ref 0.167–5.380)

## 2016-09-19 ENCOUNTER — Other Ambulatory Visit: Payer: Self-pay | Admitting: Internal Medicine

## 2016-09-19 DIAGNOSIS — M797 Fibromyalgia: Secondary | ICD-10-CM

## 2016-09-19 DIAGNOSIS — E230 Hypopituitarism: Secondary | ICD-10-CM

## 2016-09-19 DIAGNOSIS — E781 Pure hyperglyceridemia: Secondary | ICD-10-CM

## 2016-09-19 DIAGNOSIS — R5382 Chronic fatigue, unspecified: Secondary | ICD-10-CM

## 2016-09-19 LAB — ALDOSTERONE: Aldosterone: 4.9 ng/dL (ref 0.0–30.0)

## 2016-09-20 ENCOUNTER — Ambulatory Visit (FREE_STANDING_LABORATORY_FACILITY): Payer: Medicare Other

## 2016-09-20 DIAGNOSIS — G9332 Myalgic encephalomyelitis/chronic fatigue syndrome: Secondary | ICD-10-CM

## 2016-09-20 DIAGNOSIS — R5382 Chronic fatigue, unspecified: Secondary | ICD-10-CM

## 2016-09-20 DIAGNOSIS — E230 Hypopituitarism: Secondary | ICD-10-CM

## 2016-09-20 DIAGNOSIS — M797 Fibromyalgia: Secondary | ICD-10-CM

## 2016-09-20 DIAGNOSIS — E781 Pure hyperglyceridemia: Secondary | ICD-10-CM

## 2016-09-20 LAB — CBC WITH MANUAL DIFFERENTIAL
Absolute NRBC: 0 10*3/uL
Atypical Lymphocytes %: 3 %
Atypical Lymphocytes Absolute: 0.28 10*3/uL — ABNORMAL HIGH
Band Neutrophils Absolute: 0.09 10*3/uL (ref 0.00–1.00)
Band Neutrophils: 1 %
Basophils Absolute Manual: 0 10*3/uL (ref 0.00–0.20)
Basophils Manual: 0 %
Cell Morphology: NORMAL
Eosinophils Absolute Manual: 0.09 10*3/uL (ref 0.00–0.70)
Eosinophils Manual: 1 %
Hematocrit: 40.1 % — ABNORMAL LOW (ref 42.0–52.0)
Hgb: 12.8 g/dL — ABNORMAL LOW (ref 13.0–17.0)
Lymphocytes Absolute Manual: 1.48 10*3/uL (ref 0.50–4.40)
Lymphocytes Manual: 16 %
MCH: 29.9 pg (ref 28.0–32.0)
MCHC: 31.9 g/dL — ABNORMAL LOW (ref 32.0–36.0)
MCV: 93.7 fL (ref 80.0–100.0)
MPV: 11.9 fL (ref 9.4–12.3)
Metamyelocytes Absolute: 0.09 10*3/uL — ABNORMAL HIGH
Metamyelocytes: 1 %
Monocytes Absolute: 0.65 10*3/uL (ref 0.00–1.20)
Monocytes Manual: 7 %
Neutrophils Absolute Manual: 6.58 10*3/uL (ref 1.80–8.10)
Nucleated RBC: 0 /100 WBC (ref 0.0–1.0)
Platelet Estimate: NORMAL
Platelets: 244 10*3/uL (ref 140–400)
RBC: 4.28 10*6/uL — ABNORMAL LOW (ref 4.70–6.00)
RDW: 13 % (ref 12–15)
Segmented Neutrophils: 71 %
WBC: 9.27 10*3/uL (ref 3.50–10.80)

## 2016-09-20 NOTE — Progress Notes (Signed)
Labs drawn from right anticubital on first attempt using aseptic technique, pressure applied to site with no bleeding noted, patient tol well.

## 2016-09-28 ENCOUNTER — Encounter: Payer: Self-pay | Admitting: Internal Medicine

## 2016-09-28 ENCOUNTER — Other Ambulatory Visit: Payer: Self-pay | Admitting: Internal Medicine

## 2016-10-04 ENCOUNTER — Other Ambulatory Visit: Payer: Self-pay | Admitting: Internal Medicine

## 2016-10-13 ENCOUNTER — Other Ambulatory Visit: Payer: Self-pay

## 2016-10-13 NOTE — Progress Notes (Signed)
From: [name]   Sent: Thursday, October 13, 2016 12:21 PM  To: Amous, Neda M.; Ruari Reddoch  Subject: Re: 2D echo    I have not as it relates to adrenal, but I have no issue ordering an echo with the Dx of fatigue. I assume no orthopnea, PND, or lower extremity edema? This said you can have CHF with preserved ejection fraction too.     I have copied Neda to assign a team member to set this up for you.   [name]       ---  Sent from [facility]  On October 13, 2016 at 11:49:45 AM EST, Finneas Rufer [email or web address]> wrote:  [name],  The fatigue continues to be incapacitating.  I read that there may be a link between left ventricular disfunction and both adrenal insufficiency  and chronic fatigue syndrome.  Have you seen "cardiac fatigue" from left ventricular dysfunction without overt symptoms of cardiac failure or angina?  If so, should we consider a study looking at cardiac function and ejection fraction?  Thanks,  Kagan

## 2016-10-17 ENCOUNTER — Other Ambulatory Visit: Payer: Self-pay | Admitting: Internal Medicine

## 2016-10-17 ENCOUNTER — Ambulatory Visit
Admission: RE | Admit: 2016-10-17 | Discharge: 2016-10-17 | Disposition: A | Discharge status: Home / Self Care | Payer: Medicare Other | Source: Ambulatory Visit | Attending: Internal Medicine | Admitting: Internal Medicine

## 2016-10-17 ENCOUNTER — Ambulatory Visit (INDEPENDENT_AMBULATORY_CARE_PROVIDER_SITE_OTHER): Payer: Self-pay

## 2016-10-17 DIAGNOSIS — R0602 Shortness of breath: Secondary | ICD-10-CM

## 2016-10-17 DIAGNOSIS — I77810 Thoracic aortic ectasia: Secondary | ICD-10-CM | POA: Insufficient documentation

## 2016-10-17 DIAGNOSIS — R5382 Chronic fatigue, unspecified: Secondary | ICD-10-CM

## 2016-10-17 DIAGNOSIS — I351 Nonrheumatic aortic (valve) insufficiency: Secondary | ICD-10-CM | POA: Insufficient documentation

## 2016-10-17 HISTORY — PX: ECHOCARDIOGRAM, TRANSTHORACIC: SHX3784

## 2016-10-18 ENCOUNTER — Encounter: Payer: Self-pay | Admitting: Internal Medicine

## 2016-10-18 ENCOUNTER — Telehealth: Payer: Self-pay | Admitting: Internal Medicine

## 2016-10-18 NOTE — Telephone Encounter (Signed)
Patient asked if his fatigue could be related to CHF despite not having any overt signs or sxs of CHF. Pt asked if CHF could be related to any of his current medical issues including HTN and fatigue. The answer he was told was yes. We decided on checking a 2D echo to rule out a structural heart disease for what he notes as a worsening of his chronic fatigue. Also important to note that I had recently addressed major depression worsening his sxs, bt the pt has yet to follow up with Dr. Carleene Overlie and refutes the idea to my staff. I very clearly shared that depression can make physiological disease worse. Will continue to try and encourage mental health, exercise, and endo support from [facility].

## 2016-10-20 ENCOUNTER — Encounter: Payer: Self-pay | Admitting: Internal Medicine

## 2016-10-20 ENCOUNTER — Other Ambulatory Visit: Payer: Self-pay | Admitting: Cardiology

## 2016-10-20 DIAGNOSIS — I77819 Aortic ectasia, unspecified site: Secondary | ICD-10-CM

## 2016-10-20 DIAGNOSIS — J0100 Acute maxillary sinusitis, unspecified: Secondary | ICD-10-CM

## 2016-10-20 MED ORDER — AMOXICILLIN-POT CLAVULANATE 875-125 MG PO TABS
1.0000 | ORAL_TABLET | Freq: Two times a day (BID) | ORAL | 0 refills | Status: AC
Start: 2016-10-20 — End: 2016-10-30

## 2016-10-20 NOTE — Progress Notes (Signed)
Acute sinusitis that ha persisted for more than a week. Making pt feel worse than his FM/CFS baseline. OTC meds maxed. Asthma a little worse from PND, but no overt SOB.  Add Augmentin 875mg  bidx10 days

## 2016-10-21 ENCOUNTER — Ambulatory Visit: Payer: Medicare Other

## 2016-10-27 ENCOUNTER — Ambulatory Visit (INDEPENDENT_AMBULATORY_CARE_PROVIDER_SITE_OTHER): Payer: Self-pay

## 2016-11-10 ENCOUNTER — Other Ambulatory Visit: Payer: Self-pay

## 2016-11-10 DIAGNOSIS — I10 Essential (primary) hypertension: Secondary | ICD-10-CM

## 2016-11-10 DIAGNOSIS — E781 Pure hyperglyceridemia: Secondary | ICD-10-CM

## 2016-11-10 DIAGNOSIS — J4540 Moderate persistent asthma, uncomplicated: Secondary | ICD-10-CM

## 2016-11-10 MED ORDER — RAMIPRIL 10 MG PO CAPS
20.0000 mg | ORAL_CAPSULE | Freq: Every day | ORAL | 3 refills | Status: DC
Start: 2016-11-10 — End: 2017-09-19

## 2016-11-10 MED ORDER — FENOFIBRATE 145 MG PO TABS
145.0000 mg | ORAL_TABLET | Freq: Every day | ORAL | 3 refills | Status: DC
Start: 2016-11-10 — End: 2017-08-09

## 2016-11-10 MED ORDER — CYCLOSPORINE 0.05 % OP EMUL
1.0000 [drp] | Freq: Two times a day (BID) | OPHTHALMIC | 4 refills | Status: DC
Start: 2016-11-10 — End: 2017-02-16

## 2016-11-10 MED ORDER — MONTELUKAST SODIUM 10 MG PO TABS
10.0000 mg | ORAL_TABLET | Freq: Every evening | ORAL | 3 refills | Status: DC
Start: 2016-11-10 — End: 2017-10-26

## 2016-11-11 ENCOUNTER — Telehealth: Payer: Self-pay

## 2016-11-11 NOTE — Telephone Encounter (Signed)
-----  Original Message-----  From: [name]   Sent: Friday, November 11, 2016 8:58 AM  To: '[email or web address]  Subject: FW: meds refill    Good morning,    The medications below have been refilled and sent into your [facility] pharmacy.    Thank you,  [name], RN, BSN   [facility] [address], [address], [place], [place] [address]  T [phone]  [name] [phone]       This communication may contain confidential and/or privileged information. Additionally, this communication may contain protected health information (PHI) that is legally protected from inappropriate disclosure by the Privacy Standards of the [employer] Portability and Accountability Act (HIPAA) and relevant [employer]. If you are not the intended recipient, please note that any dissemination, distribution or copying of this communication is strictly prohibited. If you have received this message in error, you should notify the sender immediately by telephone or by return e-mail and delete this message from your computer. Direct questions to the Personal assistant at [phone].        -----Original Message-----  From: [name]   Sent: Thursday, November 10, 2016 2:00 PM  To: [name]  Subject: FW: meds refill    Can you give him 90 and 3 refills of each of these and let him know?  Thanks  [name]    -----Original Message-----  From: [name] [[email or web address]   Sent: Thursday, November 10, 2016 8:55 AM  To: [name]  Subject: meds refill    [name],    Could your office please phone refills to [facility]?  I return to [name] in a week.  I spoke with the doc who interpreted the neuropsychological testing (5 hrs).  My testing was so abnormal that early dementia is a certainty.  The testing now will be a specialized brain MRI for structure and a brain PET scan for function.  They will be looking hard at the hippocampus based on my testing.    Restasis   Ramipril 10 mg  BID  Montelukast 10 mg QD   Fenofibrate 145 mg QD  3 mo with 3 refills    Thanks,  Shannon [name]

## 2016-11-20 DIAGNOSIS — G3184 Mild cognitive impairment, so stated: Secondary | ICD-10-CM | POA: Insufficient documentation

## 2016-11-27 ENCOUNTER — Encounter: Payer: Self-pay | Admitting: Internal Medicine

## 2016-11-27 DIAGNOSIS — J40 Bronchitis, not specified as acute or chronic: Secondary | ICD-10-CM

## 2016-11-27 DIAGNOSIS — J4531 Mild persistent asthma with (acute) exacerbation: Secondary | ICD-10-CM

## 2016-11-27 MED ORDER — PREDNISONE 10 MG PO TABS
10.0000 mg | ORAL_TABLET | Freq: Every day | ORAL | 0 refills | Status: AC
Start: 2016-11-27 — End: 2016-12-09

## 2016-11-27 MED ORDER — ALBUTEROL SULFATE 0.63 MG/3ML IN NEBU
1.0000 | INHALATION_SOLUTION | RESPIRATORY_TRACT | 1 refills | Status: DC | PRN
Start: 2016-11-27 — End: 2018-06-05

## 2016-11-28 MED ORDER — CLARITHROMYCIN 250 MG PO TABS
250.0000 mg | ORAL_TABLET | Freq: Two times a day (BID) | ORAL | 0 refills | Status: AC
Start: 2016-11-28 — End: 2016-12-05

## 2016-11-30 ENCOUNTER — Other Ambulatory Visit: Payer: Self-pay

## 2016-12-20 ENCOUNTER — Ambulatory Visit (INDEPENDENT_AMBULATORY_CARE_PROVIDER_SITE_OTHER): Payer: Medicare Other | Admitting: Family Medicine

## 2016-12-20 ENCOUNTER — Ambulatory Visit
Admission: RE | Admit: 2016-12-20 | Discharge: 2016-12-20 | Disposition: A | Discharge status: Home / Self Care | Payer: Medicare Other | Source: Ambulatory Visit | Attending: Family Medicine | Admitting: Family Medicine

## 2016-12-20 VITALS — BP 151/75 | HR 86 | Temp 97.7°F | Resp 20 | Ht 69.0 in | Wt 192.2 lb

## 2016-12-20 DIAGNOSIS — M797 Fibromyalgia: Secondary | ICD-10-CM

## 2016-12-20 DIAGNOSIS — E230 Hypopituitarism: Secondary | ICD-10-CM

## 2016-12-20 DIAGNOSIS — I10 Essential (primary) hypertension: Secondary | ICD-10-CM

## 2016-12-20 DIAGNOSIS — F0280 Dementia in other diseases classified elsewhere without behavioral disturbance: Secondary | ICD-10-CM

## 2016-12-20 DIAGNOSIS — G308 Other Alzheimer's disease: Secondary | ICD-10-CM

## 2016-12-20 DIAGNOSIS — E2740 Unspecified adrenocortical insufficiency: Secondary | ICD-10-CM

## 2016-12-20 DIAGNOSIS — L539 Erythematous condition, unspecified: Secondary | ICD-10-CM

## 2016-12-20 DIAGNOSIS — R609 Edema, unspecified: Secondary | ICD-10-CM

## 2016-12-20 DIAGNOSIS — E039 Hypothyroidism, unspecified: Secondary | ICD-10-CM

## 2016-12-20 DIAGNOSIS — G3184 Mild cognitive impairment, so stated: Secondary | ICD-10-CM

## 2016-12-20 MED ORDER — HYDROCORTISONE 10 MG PO TABS
ORAL_TABLET | ORAL | 3 refills | Status: DC
Start: 2016-12-20 — End: 2017-06-22

## 2016-12-20 MED ORDER — TRAMADOL HCL 50 MG PO TABS
ORAL_TABLET | ORAL | 0 refills | Status: DC
Start: 2016-12-20 — End: 2018-03-23

## 2016-12-20 MED ORDER — AMOXICILLIN-POT CLAVULANATE 875-125 MG PO TABS
1.0000 | ORAL_TABLET | Freq: Two times a day (BID) | ORAL | 0 refills | Status: AC
Start: 2016-12-20 — End: 2016-12-30

## 2016-12-20 MED ORDER — VITAMIN B-12 1000 MCG PO TABS
1000.0000 ug | ORAL_TABLET | Freq: Every day | ORAL | 11 refills | Status: AC
Start: 2016-12-20 — End: 2017-12-20

## 2016-12-20 MED ORDER — NUTROPIN AQ NUSPIN 10 10 MG/2ML SC SOLN
SUBCUTANEOUS | Status: DC
Start: 2016-12-20 — End: 2018-02-13

## 2016-12-20 MED ORDER — AMLODIPINE BESYLATE 10 MG PO TABS
10.0000 mg | ORAL_TABLET | Freq: Every evening | ORAL | 11 refills | Status: DC
Start: 2016-11-20 — End: 2016-12-30

## 2016-12-20 NOTE — Progress Notes (Signed)
Subjective:      Patient ID: Shannon A. Springfield, MD  is a 66 y.o.  male.     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 .  Noticed redness extending from proximal knuckles to wrist excluding the thumb and swelling with slight tenderness between 2nd and 3rd MCP joints when he woke up yesterday morning.  Does not recall getting any bite or using any new topical cream or lotion.  Only new medication he is on is Remeron which was started about 1.5 wks ago.    It is not itchy but feels slightly tender where the localized swelling is between the 2nd and 3rd digits.  He also reports that he has had asymptomatic rash on his arms for a month or so.  He did not complain about it since it was not itchy and it was spreading anywhere.    He was seen by Dr. Toni Amend in August but had 2 other visits at [facility] with some medication adjustments.  He wants to go over the medications and gets the blood work order from [facility] done today in addition to updating his thyroid, B12, and folate level.  His thyroid dose was recently changed to a lower dose and [facility] wanted his B12 and Folate checked for his mild cognitive decline.  Was given Aricept to start but when he took both Aricept and Remeron, he got dizzy and stopped both meds.  He has been back on Remeron for 1.5 wks but have not yet started back on Aricept.    The following sections were reviewed this encounter by the provider:   Allergies  Meds  Problems        Past Medical History:   Diagnosis Date   . Amnestic MCI (mild cognitive impairment with memory loss) 11/20/2016   . Anemia     Mild   . Asthma    . Avascular necrosis     Bilateral hip   . Benign prostate hyperplasia    . Chronic fatigue fibromyalgia syndrome    . Cold intolerance    . Color blindness    . Depression     mild, pain related   . DJD (degenerative joint disease)     Lumbar / Sacral    . Encephalomalacia    . Fatigue    . Hemorrhoids    . History of colonoscopy 2008    normal, repeat in 10 years?   . History of MRI 09/10/15    MRI Brain with/without contrast   . Hyperlipidemia    . Hypertension    . Hypertriglyceridemia    . Hypogonadism male    . Hypothyroid 04/12/2011   . Hypothyroidism    . Joint pain    . Lateral epicondylitis     Left   . Low serum vitamin D    . Meningioma    . Muscle pain    . Myalgia    . Osteoporosis    . Sinus problem     Intermittent   . Skin neoplasm 10/21/2015   . Snoring        Past Surgical History:   Procedure Laterality Date   . BRAIN SURGERY      Partial brain resection secondary to meningioma.    [name] EXCISION, LESION  12/03/2008    benign  lesion   . TONSILLECTOMY         Allergies   Allergen Reactions   . Bextra [Valdecoxib] Rash       Current Outpatient Prescriptions on File Prior to Visit   Medication Sig Dispense Refill   . albuterol (VENTOLIN HFA) 108 (90 Base) MCG/ACT inhaler Inhale 1 puff into the lungs every 6 (six) hours as needed for Wheezing. 3 Inhaler 2   . aspirin 81 MG EC tablet Take 81 mg by mouth daily.       . Calcium Carbonate-Vitamin D (CALCIUM + D PO) Take by mouth. 500/400mg   daily      . Coenzyme Q10 100 MG capsule Take 100 mg by mouth daily.     . cycloSPORINE (RESTASIS) 0.05 % ophthalmic emulsion Place 1 drop into both eyes every 12 (twelve) hours. 5.5 mL 4   . donepezil (ARICEPT) 10 MG tablet Take 10 mg by mouth every morning.     . ezetimibe (ZETIA) 10 MG tablet Take one tablet by mouth daily 90 tablet 2   . fenofibrate (TRICOR) 145 MG tablet Take 1 tablet (145 mg total) by mouth daily. 90 tablet 3   . mirtazapine (REMERON) 15 MG tablet Take 15 mg by mouth nightly.     . montelukast (SINGULAIR) 10 MG tablet Take 1 tablet (10 mg total) by mouth nightly. 90 tablet 3   . Multiple Vitamins-Minerals (CENTRUM SILVER PO) Take 1 tablet by mouth daily.       . mupirocin (BACTROBAN) 2 % ointment      . pyridoxine (B-6) 100 MG tablet Take 100 mg by  mouth daily.     . ramipril (ALTACE) 10 MG capsule Take 2 capsules (20 mg total) by mouth daily. 180 capsule 3   . simvastatin (ZOCOR) 20 MG tablet TAKE ONE TABLET BY MOUTH NIGHTLY AS DIRECTED 90 tablet 2   . VITAMIN D, CHOLECALCIFEROL, PO Take 5,000 Units by mouth daily.        . vitamin E 1000 UNIT capsule Take by mouth daily.        . [DISCONTINUED] AMERICAN GINSENG PO Take 1,000 mg by mouth 2 (two) times daily.     . [DISCONTINUED] amLODIPine (NORVASC) 5 MG tablet Take 1 tablet (5 mg total) by mouth 2 (two) times daily. 180 tablet 3   . [DISCONTINUED] cyanocobalamin 100 MCG tablet Take 100 mcg by mouth daily.     . [DISCONTINUED] DULoxetine (CYMBALTA) 60 MG capsule Take 1 capsule (60 mg total) by mouth daily. 90 capsule 3   . [DISCONTINUED] finasteride (PROSCAR) 5 MG tablet Take 1 tablet (5 mg total) by mouth daily. 90 tablet 3   . [DISCONTINUED] FLUZONE HIGH-DOSE 0.5 ML Suspension Prefilled Syringe IM injection (age 58 & greater)   0   . [DISCONTINUED] hydrocortisone (CORTEF) 10 MG tablet   3   . [DISCONTINUED] levothyroxine (SYNTHROID, LEVOTHROID) 200 MCG tablet Take 1 tablet (200 mcg total) by mouth daily. 90 tablet 3   . [DISCONTINUED] modafinil (PROVIGIL) 100 MG tablet Take 1 tablet (100 mg total) by mouth 3 (three) times daily. 270 tablet 0   . [DISCONTINUED] naloxone (NARCAN) 2 MG/2ML injection Infuse 2 mLs (2 mg total) into the vein as needed (Spray 1 ml into nostril as needed (may repeat if unconscious).). 2 Syringe 0   . [DISCONTINUED] NUTROPIN AQ NUSPIN 5 5 MG/2ML Solution      . [DISCONTINUED] predniSONE (DELTASONE) 1 MG tablet      . [DISCONTINUED] predniSONE (  DELTASONE) 5 MG tablet      . [DISCONTINUED] PREVNAR 13 Suspension   0   . [DISCONTINUED] promethazine (PHENERGAN) 25 MG tablet Take 1 tablet (25 mg total) by mouth every 6 (six) hours as needed for Nausea. 20 tablet 0   . [DISCONTINUED] testosterone (ANDROGEL) 50 MG/5GM (1%) Gel APPLY 2.5GMS (1/2 PACKET) ONCE DAILY ON SKIN AS DIRECTED. 150 g  0   . [DISCONTINUED] traMADol (ULTRAM) 50 MG tablet 1/2 tab  daily for 3d, then may inc to 1/2 twice daily for 3d and then 1/2 tab 3 times daily for 30 days max. 43 tablet 0   . [DISCONTINUED] triamcinolone (NASACORT) 55 MCG/ACT nasal inhaler USE 2 SPRAYS NASALLY TWICE DAILY 4 Inhaler 3   . [DISCONTINUED] Turmeric 500 MG Cap Take 1 capsule by mouth 2 (two) times daily.     . [DISCONTINUED] Turmeric Curcumin 500 MG Cap Take by mouth.       No current facility-administered medications on file prior to visit.        Social History     Social History   . Marital status: Married     Spouse name: N/A   . Number of children: N/A   . Years of education: N/A     Occupational History   . Not on file.     Social History Main Topics   . Smoking status: Never Smoker   . Smokeless tobacco: Not on file   . Alcohol use No   . Drug use: No   . Sexual activity: Not on file     Other Topics Concern   . Not on file     Social History Narrative   . No narrative on file       Family History   Problem Relation Age of Onset   . Asthma Daughter    . Tuberculosis Mother        Review of Systems   Constitutional: Positive for fatigue (chronic). Negative for chills and fever.   HENT: Negative for congestion (on combination nasal spray compounded by [facility] clinic.  works buch better than nasacort NS.).    Eyes: Negative for visual disturbance.        On Restasis for chronic dry eyes.   Respiratory: Negative for wheezing (on Flovent bid).    Cardiovascular: Negative for chest pain.   Gastrointestinal: Negative for abdominal pain.   Genitourinary:        On ?med for prostate but unsure what it is.  Getting it from [facility] clinic   Musculoskeletal:        No recent change regarding his pain level.  Gets tramadol and clonazepam from [facility] clinic.  Takes 1/2 of Tramadol daily on average.  Also takes 1/2 of clonazepam prn.   Skin: Positive for rash (has had rash on his arms for >1 months.  denies any itchiness). Negative for wound.        No recent  exposure to insect bites or cuts   Neurological: Negative for dizziness (resolved since stopping both Aricept and Remeron.  doing fine on Remeron only) and headaches.   Hematological: Does not bruise/bleed easily.   Psychiatric/Behavioral:        Having some cognitive declining.  Taking B12 supplement.  Needs to get his B12 and folate level checked.           BP 151/75 (BP Site: Left arm, Patient Position: Sitting, Cuff Size: Medium)   Pulse 86   Temp 97.7 F (  36.5 C) (Oral)   Resp 20   Ht 1.753 m (5\' 9" )   Wt 87.2 kg (192 lb 3.2 oz)   BMI 28.38 kg/m     Objective:   Physical Exam   Constitutional: He appears well-developed. No distress.   HENT:   Right Ear: External ear normal.   Left Ear: External ear normal.   Nose: Nose normal.   Eyes: Conjunctivae are normal.   Cardiovascular: Normal rate, regular rhythm and normal heart sounds.    Pulmonary/Chest: Effort normal and breath sounds normal. No respiratory distress. He has no wheezes.   Lymphadenopathy:     He has no cervical adenopathy.   Neurological: He is alert. Coordination normal.   Skin: Rash (on the dorsal surface of the arms bilaterally) noted. Rash is macular.        Sl erythematous skin pigmentation changes with distinct demarcation from proximal knuckles not including the MCP joints, extending to the wrist joint excluding the thumb.  Point swelling and tenderness between 2nd and 3rd MCP joints.      Diffuse macular rash on the dorsal surface of the arms bilaterally extending to neck and upper chest.     Psychiatric: He has a normal mood and affect. His behavior is normal. Thought content normal.   Vitals reviewed.       Assessment:   1. Hand erythema  - >30 min spent going over pt's medication updates and his current symptoms  - DDX discussed including early cellulitis, contact dermatitis, medication reaction(Remeron causing peripheral edema and photosensitivity) even though unlikely (localized)  - XR Hand Right PA And Lateral; Future   -  amoxicillin-clavulanate (AUGMENTIN) 875-125 MG per tablet; Take 1 tablet by mouth 2 (two) times daily.for 10 days  Dispense: 20 tablet; Refill: 0 - since pt is on chronic Cortef  - CBC and differential  - Comprehensive metabolic panel  - Sedimentation rate (ESR)  - Uric acid  - if no improvement on abx, either get derm referral or increase prednisone briefly.      2. Peripheral edema  - amoxicillin-clavulanate (AUGMENTIN) 875-125 MG per tablet; Take 1 tablet by mouth 2 (two) times daily.for 10 days  Dispense: 20 tablet; Refill: 0  - CBC and differential  - Comprehensive metabolic panel  - Sedimentation rate (ESR)  - Uric acid    3. Hypothyroidism, unspecified type  - med dose updated on med list  - TSH  - T4, free    4. Essential hypertension  - medication reviewed and updated  - pt's BP not optimal.  - he will keep a log of BPs and f/u with [facility] clinic providers    5. Adrenal insufficiency  - med dosing updated    6. Growth hormone deficiency  - his med dose reviewed and updated.    7. Fibromyalgia, secondary  - updated medications given by [facility]    8. Mild cognitive impairment  - Vitamin B12  - Folate    9. Other Alzheimer's disease   - Vitamin B12  - Folate         Plan:   - pt had lab order with specimen collection kit - lab drawn and given to pt to be sent out to [facility]  - updated labs as above  - consider derm evaluation if no improvement    Risk & Benefits of any new medication(s) were explained to the patient who verbalized understanding & agreed to the treatment plan.     Call if  symptoms persist, worsen, or change.  Call with updates/questions/concerns.    Otis Brace, MD

## 2016-12-21 LAB — CBC AND DIFFERENTIAL
Baso(Absolute): 0 10*3/uL (ref 0.0–0.2)
Basos: 0 %
Eos: 2 %
Eosinophils Absolute: 0.1 10*3/uL (ref 0.0–0.4)
Hematocrit: 40.6 % (ref 37.5–51.0)
Hemoglobin: 13.3 g/dL (ref 13.0–17.7)
Immature Granulocytes Absolute: 0 10*3/uL (ref 0.0–0.1)
Immature Granulocytes: 0 %
Lymphocytes Absolute: 1.9 10*3/uL (ref 0.7–3.1)
Lymphocytes: 26 %
MCH: 29.4 pg (ref 26.6–33.0)
MCHC: 32.8 g/dL (ref 31.5–35.7)
MCV: 90 fL (ref 79–97)
Monocytes Absolute: 0.5 10*3/uL (ref 0.1–0.9)
Monocytes: 7 %
Neutrophils Absolute: 4.7 10*3/uL (ref 1.4–7.0)
Neutrophils: 65 %
Platelets: 240 10*3/uL (ref 150–379)
RBC: 4.52 x10E6/uL (ref 4.14–5.80)
RDW: 13.8 % (ref 12.3–15.4)
WBC: 7.2 10*3/uL (ref 3.4–10.8)

## 2016-12-21 LAB — TSH: TSH: 0.436 u[IU]/mL — ABNORMAL LOW (ref 0.450–4.500)

## 2016-12-21 LAB — COMPREHENSIVE METABOLIC PANEL
ALT: 40 IU/L (ref 0–44)
AST (SGOT): 34 IU/L (ref 0–40)
Albumin/Globulin Ratio: 2.1 (ref 1.2–2.2)
Albumin: 4.6 g/dL (ref 3.6–4.8)
Alkaline Phosphatase: 39 IU/L (ref 39–117)
BUN / Creatinine Ratio: 18 (ref 10–24)
BUN: 26 mg/dL (ref 8–27)
Bilirubin, Total: 0.5 mg/dL (ref 0.0–1.2)
CO2: 24 mmol/L (ref 18–29)
Calcium: 9.7 mg/dL (ref 8.6–10.2)
Chloride: 102 mmol/L (ref 96–106)
Creatinine: 1.44 mg/dL — ABNORMAL HIGH (ref 0.76–1.27)
EGFR: 50 mL/min/{1.73_m2} — ABNORMAL LOW (ref >59)
EGFR: 58 mL/min/{1.73_m2} — ABNORMAL LOW (ref >59)
Globulin, Total: 2.2 g/dL (ref 1.5–4.5)
Glucose: 124 mg/dL — ABNORMAL HIGH (ref 65–99)
Potassium: 4 mmol/L (ref 3.5–5.2)
Protein, Total: 6.8 g/dL (ref 6.0–8.5)
Sodium: 144 mmol/L (ref 134–144)

## 2016-12-21 LAB — T4, FREE: T4, Free: 1.55 ng/dL (ref 0.82–1.77)

## 2016-12-21 LAB — FOLATE: Folate: >20 ng/mL (ref >3.0)

## 2016-12-21 LAB — SEDIMENTATION RATE: Sed Rate: 3 mm/hr (ref 0–30)

## 2016-12-21 LAB — URIC ACID: Uric acid: 5.9 mg/dL (ref 3.7–8.6)

## 2016-12-21 LAB — VITAMIN B12: Vitamin B-12: >2000 pg/mL — ABNORMAL HIGH (ref 232–1245)

## 2016-12-22 ENCOUNTER — Telehealth: Payer: Self-pay

## 2016-12-22 ENCOUNTER — Encounter (INDEPENDENT_AMBULATORY_CARE_PROVIDER_SITE_OTHER): Payer: Self-pay | Admitting: Internal Medicine

## 2016-12-22 NOTE — Telephone Encounter (Signed)
From: [name] [[email or web address]]   Sent: Wednesday, Dec 21, 2016 4:53 PM  To: Randolph Bing  Subject: Re: [facility] - Dr. Coralyn Mark,  Congrats on all your promotions and accomplishments!  I am totally comfortable with the plan.  Any thoughts about my creatinine and GFR or should I make an appointment with the appropriate partner?  I am hopeful that the [facility] bloods will be run since l responded immediately.  Thanks,  [place]

## 2016-12-22 NOTE — Telephone Encounter (Signed)
From: [name]   Sent: Thursday, Dec 22, 2016 9:30 AM  To: Randolph Bing  Cc: Modesto Charon.  Subject: RE: [facility]ather[facility] [address] - Dr. Toni Amend     Please tell him to hydrate well and let's repeat the BMP in two weeks. Let's set up a get to know you meeting for Rich and Dr. [name] in late May or early June.

## 2016-12-22 NOTE — Telephone Encounter (Signed)
Called patient to inform patient to stay well hydrated and to repeat BMP in 2 weeks.     Patient has been feeling really fatigued and is house bound for days at a time.  He plans to bring in his notebook to have Dr. Orlin Hilding review prior to his appointment.  Will return call to the office once he finds his calendar to schedule labs and an office visit with Dr. Orlin Hilding.

## 2016-12-29 ENCOUNTER — Other Ambulatory Visit: Payer: Self-pay | Admitting: Internal Medicine

## 2016-12-29 ENCOUNTER — Telehealth: Payer: Self-pay

## 2016-12-29 NOTE — Telephone Encounter (Signed)
From: [name] [email or web address]  Date: Dec 29, 2016 7:52:22 AM EDT  To: [email or web address]  Date: Dec 29, 2016 8:04:30 AM EDT  To: "Norina [email or web address]. [email or web address]" [email or web address]    [name],    My hand has not responded to Augmentin.  It has not really changed other than slightly more swelling and tenderness at the MP joint.  i am going out of town for a week, leaving a week from this coming Sunday.  The x-Hendricks and blood work were negative other than slightly elevated Creatinine and decreased GFR.    I have an appointment with Dr. Orlin Hilding on May 30th to meet him.  Wonder if this can wait until then?  Once again, not a straight forward diagnosis.  But this time one can see the abnormality as opposed to fatigue and pain.  Thanks,    [employer] from my iPhone

## 2016-12-29 NOTE — Telephone Encounter (Signed)
From: Randolph Bing   Sent: Thursday, Dec 29, 2016 4:49 PM  To: [name]  Cc: Jahmani Heldt  Subject: RE: Harrietta Guardian    Yes, I have Dr. [name] scheduled for Tuesday, May 15th @ 9:00am with the nurse and @ 9:30am with Dr. Toni Amend - this is at the [facility] office.     Let me know if you need anything else, happy to help.  Dr. [name], you may bring your records at this time as well.     Victorino Dike      From: [name]   Sent: Thursday, Dec 29, 2016 2:00 PM  To: Randolph Bing  Cc: [name]  Subject: Re: Brotz, R    Can we squeeze him in to my clinical time.  I would like to take a peak at the hand. I did respond yesterday. Strange it was not received. Sorry.

## 2016-12-30 ENCOUNTER — Other Ambulatory Visit: Payer: Self-pay

## 2016-12-30 ENCOUNTER — Ambulatory Visit: Payer: Medicare Other | Admitting: Internal Medicine

## 2016-12-30 DIAGNOSIS — I10 Essential (primary) hypertension: Secondary | ICD-10-CM

## 2016-12-30 DIAGNOSIS — E039 Hypothyroidism, unspecified: Secondary | ICD-10-CM

## 2017-01-02 MED ORDER — LEVOTHYROXINE SODIUM 150 MCG PO TABS
150.0000 ug | ORAL_TABLET | Freq: Every day | ORAL | 3 refills | Status: DC
Start: 2017-01-02 — End: 2018-01-26

## 2017-01-02 MED ORDER — AMLODIPINE BESYLATE 10 MG PO TABS
10.0000 mg | ORAL_TABLET | Freq: Every evening | ORAL | 3 refills | Status: DC
Start: 2017-01-02 — End: 2017-01-02

## 2017-01-03 ENCOUNTER — Ambulatory Visit: Payer: Medicare Other | Admitting: Internal Medicine

## 2017-01-03 ENCOUNTER — Ambulatory Visit (FREE_STANDING_LABORATORY_FACILITY): Payer: Medicare Other | Admitting: Internal Medicine

## 2017-01-03 VITALS — BP 149/81 | HR 65 | Temp 97.4°F | Ht 69.0 in | Wt 191.0 lb

## 2017-01-03 DIAGNOSIS — R7989 Other specified abnormal findings of blood chemistry: Secondary | ICD-10-CM

## 2017-01-03 LAB — BASIC METABOLIC PANEL
BUN: 27 mg/dL (ref 9.0–28.0)
CO2: 28 mEq/L (ref 21–29)
Calcium: 9.9 mg/dL (ref 8.5–10.5)
Chloride: 103 mEq/L (ref 100–111)
Creatinine: 1.3 mg/dL (ref 0.5–1.5)
Glucose: 101 mg/dL — ABNORMAL HIGH (ref 70–100)
Potassium: 4.4 mEq/L (ref 3.5–5.1)
Sodium: 140 mEq/L (ref 136–145)

## 2017-01-03 LAB — GFR: EGFR: 55

## 2017-01-03 LAB — HEMOLYSIS INDEX: Hemolysis Index: 7 (ref 0–18)

## 2017-01-03 NOTE — Progress Notes (Signed)
Right hand mild swelling and increased erythema and warmth at the third MTP joint. No grip issues. No injuries or foreign body. Sxs 3-4 weeks. Completed Augmentin without benefit. Watch 2 -3 weeks and MRI if persists. Not a ganglion in sxs/appearance. No reflex sympathetic dystrophy sxs.     Review stimulants for pt and check PGx- trial effort. Run multicheck prior and HTN follow closely.     A/P  RIght third MTP swelling with point tenderness-no infectious.  -observe for another 1-2 weeks-imaging if worsens or persists.

## 2017-01-03 NOTE — Progress Notes (Signed)
Fasting: No    Arm/Extremity: Right Arm    Needle Used: 23G Butterfly    Lab: ICL    Complications: None    Was urine sample collected: No

## 2017-01-04 ENCOUNTER — Other Ambulatory Visit: Payer: Self-pay

## 2017-01-04 MED ORDER — CARVEDILOL 3.125 MG PO TABS
3.1250 mg | ORAL_TABLET | Freq: Two times a day (BID) | ORAL | 3 refills | Status: DC
Start: 2017-01-04 — End: 2017-01-18

## 2017-01-04 NOTE — Telephone Encounter (Signed)
Received refill request from [facility] today for patient's Rx: carvedilol (Coreg) 3.125mg  1 tab PO BID.

## 2017-01-07 ENCOUNTER — Encounter: Payer: Self-pay | Admitting: Internal Medicine

## 2017-01-07 DIAGNOSIS — J3489 Other specified disorders of nose and nasal sinuses: Secondary | ICD-10-CM

## 2017-01-07 MED ORDER — KETOCONAZOLE 2 % EX CREA
1.0000 | TOPICAL_CREAM | Freq: Every day | CUTANEOUS | 0 refills | Status: AC
Start: 2017-01-07 — End: 2017-01-21

## 2017-01-07 MED ORDER — KETOCONAZOLE 2 % EX GEL
1.0000 | Freq: Every day | CUTANEOUS | 0 refills | Status: DC
Start: 2017-01-07 — End: 2017-01-18

## 2017-01-17 ENCOUNTER — Encounter: Payer: Self-pay | Admitting: Internal Medicine

## 2017-01-17 DIAGNOSIS — J329 Chronic sinusitis, unspecified: Secondary | ICD-10-CM | POA: Insufficient documentation

## 2017-01-17 DIAGNOSIS — Z87898 Personal history of other specified conditions: Secondary | ICD-10-CM | POA: Insufficient documentation

## 2017-01-17 DIAGNOSIS — Z9989 Dependence on other enabling machines and devices: Secondary | ICD-10-CM | POA: Insufficient documentation

## 2017-01-17 DIAGNOSIS — G4733 Obstructive sleep apnea (adult) (pediatric): Secondary | ICD-10-CM | POA: Insufficient documentation

## 2017-01-17 DIAGNOSIS — E2749 Other adrenocortical insufficiency: Secondary | ICD-10-CM | POA: Insufficient documentation

## 2017-01-17 DIAGNOSIS — K529 Noninfective gastroenteritis and colitis, unspecified: Secondary | ICD-10-CM | POA: Insufficient documentation

## 2017-01-18 ENCOUNTER — Ambulatory Visit (INDEPENDENT_AMBULATORY_CARE_PROVIDER_SITE_OTHER): Payer: Medicare Other | Admitting: Internal Medicine

## 2017-01-18 ENCOUNTER — Encounter: Payer: Self-pay | Admitting: Internal Medicine

## 2017-01-18 VITALS — BP 149/79 | HR 70 | Temp 97.7°F | Resp 18

## 2017-01-18 DIAGNOSIS — I10 Essential (primary) hypertension: Secondary | ICD-10-CM

## 2017-01-18 DIAGNOSIS — B370 Candidal stomatitis: Secondary | ICD-10-CM

## 2017-01-18 DIAGNOSIS — G3184 Mild cognitive impairment, so stated: Secondary | ICD-10-CM

## 2017-01-18 DIAGNOSIS — Z87898 Personal history of other specified conditions: Secondary | ICD-10-CM

## 2017-01-18 MED ORDER — NYSTATIN 100000 UNIT/ML MT SUSP
OROMUCOSAL | 0 refills | Status: DC
Start: 2017-01-18 — End: 2017-01-21

## 2017-01-18 NOTE — Progress Notes (Signed)
Chief Complaint   Patient presents with   . initial visit       History of Present Illness    Shannon A. 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 Toni Amend who I am seeing for the first time who is here for evaluation and treatment of the following concerns: He is here to review his medical history and establish care - The patient will be followed collaboratively by his Shannon [name] Dr. Toni Amend and with me as needed. He had brought with him yesterday a thick binder summarizing his medical history which is considerable. This is reviewed and profiled below.     Overall he reports feeling well and has no acute complaints or concerns at this time.  He feels he has had some oral thrush related to his use of his asthma inhalers and requests a prescription for some nystatin to address this.  He denies any dysphagia.  He denies any odynophagia.     Patient has been followed at the [facility] by numerous subspecialists as summarized below.  Most recently he has been titrating his blood pressure medications.  His amlodipine was increased from 5 mg to 10 mg daily.  His Coreg is also been titrated upwards.  His blood pressure remains slightly elevated today.  He plans to be in touch with the [facility] to continue titrating medications as needed.  He denies any new worsening lightheadedness or dizziness, chest pain or lower extremity swelling.     Patient was also recently evaluated at the [facility] by psychiatry and neurology and recommended to start donepezil and Remeron to help with mood and sleep.  He started these simultaneously and had issues with dizziness.  Since that time he has discontinue the Remeron and started a lower dose of donepezil, which he is tolerating at this time.        Patient Active Problem List    Diagnosis Date Noted   . History of meningioma of the brain 11/09/2015     Priority: High     12/09/02: s/p R parieto-occipital parasagittal craniotomy for 85-90% resection of R pareito-occipital falx meningioma that  originally measured 61 x 24 mm    - 10/27/16: eval w [name] (Neurology - [facility]): agree w [name] that lesion is stable and that calcification should be reassuring .[name] I recommended he remain under imaging surveillance. I have told him that it is very unlikely that there is a dysautonomia to account for his sx when upright, but I personally like to be sure that we have no evidence for that. He will be seen in Sleep Medicine and I endorse that as well.   - 07/04/16: eval w  [name] (Neurosurgery -[facility]): stable both clinically and on imaging w regard to his now calcified residual parasagittal meningioma. ... Now that it is calcified, I am not convinced there is any change between Jan and Oct of this year. I think continued observation would be very reasonable. Recommend FU MRI in one year.   - eval w [name], Neurosurgery [facility]  - rec FU 1 year  - MRI Brain w/wo contrast 06/07/16: Again seen is a L posterior parafalcine meningioma. There is a slightly greater measurement in the cephalocaudal plane which may be related to technique or minimal tumor progression. Progressive opacification of the L mastoid air cells. Otherwise stable exam  - 10/13/16: letter from [name]: Neurosurgery at [facility]: "CT scan does show dents of calcification.Marland KitchenMarland KitchenAs we discussed  in clinic I think it is unlikely that ongoing sx are related to meningioma .[name] I would continue to follow w MRI scan in one year.   - 10/07/15: eval w [name], Neurosurgery at [facility]: "number of nonspecific sx. I am not certain that they are related to any enlargement of his residual meningioma and certainly would not expect them to be improved by any treatment of his residual meningioma. With that in minds I think the question to establish is whether or not this residual is in fact enlarging. ... I suspect that this lesion is stable and in fact calcified. If that were the case, observation with another MRI scan in a year may be very  reasonable. Alternatively we could consider either gamma knife radiosurgery or craniotomy and resection. The lesion is at the UL for size for what we could treat w gamma knife, but I think it could be accomplished in one or perhaps two staged procedures. However I would only recommend this if the lesion was clearly enlarging and I am not certain of this at the present time. Recommend CT scan of head to confirm that lesion is calcified. I will be in touch with him after this is available.   - 10/06/15: eval w Neurology [name] [facility]): I have told Dr. Lacretia Nicks that I really cannot give him any advise until I review outside pathology and prior films .[name] Pt requested appt in Fibromyalgia/Chronic fatigue clinic as well as Endo.   - 09/16/15 eval w [name], Neurosurgery [facility]: I do not believe any of the sx he is having now are related to the tumor. By report he did not have papilledema in [name] exam. I recommended however that he undergo CyberKnife radiosurgery. Pt will call my office to set up RadOnc consult and will proceed w CyberKnife tx.      . Chronic fatigue fibromyalgia syndrome 01/24/2013     Priority: High     In Aug 2006 hd noted spontaneous onset of severe fatigue and myalgias. He had extensive evaluation for CT and autoimmune disease. Ultimately he was discovered to have inadequate thyroid replacement as well as a low free testosterone. He was evaluated by endocrine at that time. ... Ultimately he was diagnosed with fibromyalgia and chronic fatigue syndrome for which he was prescribed various medications including provigil, cymbalta, gabapentin and pregabalin.     - 10/08/15:  note from [name] [facility] Fibromyalgia and Chronic Fatigue): presentation is c/w fibromyalgia and chronic fatigue. Advised to complete Fibro/CFS treatment program  - 10/06/15 Note from Neurology Dr. Raynelle Fanning [facility]: In Aug 2006 noted spontaneous onset of severe fatigue and myalgias. He had extensive  evaluation for CT and autoimmune disease. Ultimately he was discovered to have inadequate thyroid replacement as well as low free testosterone. He was eval by endocrine  ... Ultimately he was diagnosed with fibromyalgia and chronic fatigue syndrome for which he was prescribed various medications including provigil, cymbalta, gabapentin and pregabalin.   - 07/01/16: note from Dr. Toni Amend: Pt informed I will no longer refill his percocet, clonazepam, or provigil as there are no clear disease indications. Pt upset and feels he needs these meds for his CF/FMS. I advised the patient that he will need to have either rheumatology or a pain specialist provide if they feel they are indicated. The patient has shown no evidence of abuse, but also has been on these meds for several years and has shown no benefit in my opinion.  The patient is currently being treated for panhypopit and adrenal insuff, hypogonadism, and hypothyroidism as a result, as well as, growth hormone deficiency. Further analysis suggests polypharmacy with potential drug interactions. The pt knows how to wean clonazepam and has asked for 30days of tramadol to wean from the percocet. Note he is only on a half tablet of percocet qid. As a result will provide only 25mg  tabs to start qday and increase every third day to bid and tid max for 30 days total. No refills will be provided. Pt is aware.      [name] History of insomnia 01/17/2017     Priority: Medium     - 10/31/16: eval w Endocrine Dr. Tawanna Cooler [place]): I believe this is a major contributor - we did have him do overnight oximetry with using CPAP and this did show continued desaturations that were very long periods of time when he was up and not able to fall asleep - he is seeing the sleep center      . Secondary adrenal insufficiency 01/17/2017     Priority: Medium     - 11/23/16: eval w Endocrine Dr. Tawanna Cooler [facility]): due for reassessment of FT4 and IGF 1 in 4 weeks after changing dose on 10/27/16. Mail  in specimen. Pursue recommendations for insomnia from psychiatry. MRI did not show signs of dementia. PFT were normal. FU 6-12 months  - 10/24/16: eval w Endocrine Dr. Tawanna Cooler [facility]): despite adequate replacement w levothyroxine, GH and hydrocortisone he still has significant fatigue, although there has been a definite benefit from the hormone treatment. Levothyroxine dose is a little high given the slightly low TSH and this is not uncommon to see replacement dose requirements decrease for thyroid replacement when [facility] is initiated. We are waiting on reports on IGF1 and testosterone.      . 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 Dr. James Ivanoff: "complicated situation. Recommend trying remeron 15 mg to help w sleep, mood and anxiety. ... If his sleep can be improved that could potentally improve his cognition. Try it for at least 6-8 weeks. He can let me know how he is doing via patient portal. I also recommended that he try to simplify his medication and situations, meaning not using klonopin or percocet. I cautioned him on marijuana use. Recommend continuing w cognitive behavioral therapy and psychotherapy techniques.      . Amnestic MCI (mild cognitive impairment with memory loss) 11/20/2016     Priority: Medium     - had dizziness on donepezil and remeron - Sabin'd both - as of 01/18/17 - on half tab of donepezil 10 mg  - 11/24/16: eval w Neurology Dr. Betsey Holiday [facility]): He does have insomnia, and remeron just commenced which makes good sense. He may also benefit from cholinergic stimulation and therefore donepezil may be helpful here. .. We discussed potential role of amyloid PET testing. I will arrange an amyvid PET scan. If negative, early AD is effectively ruled out. Consider brain rehab program. Meet back after PET testing.   - 10/31/16: eval w Endocrine Dr. Eyvonne Mechanic [facility]): Dr. Earney Hamburg completed neuropsychologic  testing and incidates he has very focal impairment in verbal memory and to a lesser extent in visual memory and indicates this is consistent with the amnestic variety of mild cognitive impairment. Neurology is arranging for further testing with MRI for cognitive dysfunction and  brain PET scan.   - 10/28/16 eval w Dr Azucena Kuba [facility] Neuropsych): impaired performance in several measures of verbal learning and memory and some marginally poor performances in measures of nonverbal learning and memory. Marland KitchenMarland KitchenMarland KitchenThese results are significant and while neuropsychological testing in and of itself cannot diagnose any particular condition you would potentially benefit from further discussion, possibly with our colleagues in Neurology to determine if there is some underlying condition that may be contributing to these memory difficulties even beyond your history of brain tumor.      . Growth hormone deficiency 03/29/2016     Priority: Medium     - 10/31/16: eval w Endocrine Dr. Tawanna Cooler [place] [facility]): increase GH to 0.4 mg daily  - 08/26/16:  eval w Dr. Eyvonne Mechanic [facility]): At this point suspect that tsome of these issues will improve the longer he is on [facility] .Marland KitchenMarland Kitchen Therefore I am not concerned with him taking 7.5 mg to 10 mg of prednisone a day or its equivalent if that is what he needs in the next several months.   - 07/11/16: eval w Dr. Tawanna Cooler [place] [facility] Endocrine): he did have very + response to hydrocortisone replacement  .[name] However he would lose effect after 4 hours. .. We have talked about options and could try prednisone with longer half life - swtch to t mg in AM and 1 mg between 3-5 pm and adjust as needed. He has been on finasteride for many years and give trial off of this. New rx for androgel packets. Thyroid function is excellent.   - 12/10/15: eval w Dr. Tawanna Cooler [place] [facility]): no response to [facility] after glucagon stimulation. He does have some sx that could be explained by partial adrenal insufficiency ...  I would like to proceed with a trial of glucocorticoid replacement now prior to starting [facility] to see if he has the typical response we would see w cortisol replacement. ... If his s do not improve w hydrocortisone in replacement doses this can be [place]. I think however we still would need to follow this given the unclear nature of the underlying pituitary process and the possibility that deficits may develop as time goes on.   - 10/08/15:  note from [name] [facility] Fibromyalgia and Chronic Fatigue): presentation is c/w fibromyalgia and chronic fatigue. Advised to complete Fibro/CFS treatment program  - 10/07/15: eval w Dr. Eyvonne Mechanic [facility] Endocrine): Await testosterone levels and advise on adjustments ... We do not need to worry about adrenal insufficiency. However he has multiple sx which are c/w growth hormone deficiency and I think this is worth defining. I will do glucagon stimulation test  - if this confirms GH deficiency I would strongly recommend replacement which hopefully will improve at least some of his sx particularly a component of his fatigue, his social isolation, his decreased ambition and altered body composition. I will see him after testing is complete.      . Essential hypertension 01/24/2013     Priority: Medium     - eval w Dr. Ova Freshwater (Nephor/HTN at [facility]): BP seems better, monitor home readings - not having significant autonomic dysfunction  -ECHO 10/27/16 [facility]: nl LV size and systolic function w EF of 65% - mild AV regurgitation and mild ascending aortic dilatation - recommend repeat ECHO in 12 months  - 10/26/16: eval w Dr. Ova Freshwater (Nephro/HTN at [facility]): recommend change amlodipine to 10 mg from 5 mg and take at night. Continue ramipril 20  mg. I would hesitate to start clonidine given dry eyes and dry mouth. I would hesitate to put him on diuretic given the adrenal insufficiency. I would therefore lead toward putting him on something like low dose alpha blocker like  carvedilol. ... He is agreeable to try this. We will follow up next month.   - 10/25/16: eval w Dr. Allen Norris (Cardiology): mild AV regurge based on outside echo, repeat echo. Borderline mid ascending aortic dilatation. Recommend ECHO in 12 months for FU. Lipid profile appears appropriate     . Mixed hyperlipidemia 01/24/2013     Priority: Medium     TC 189 TRI 112 HDL 81 LDL 86 09/14/16     . Primary hypothyroidism 01/24/2013     Priority: Medium     - 10/31/16: eval w Endocrine Dr. Tawanna Cooler [place] [facility]): FT4 mildly elevated on 200 mcg day - decrease to 150 mcg daily and recheck FT4 in 8 weeks     . Hemianopia, homonymous, left 09/15/2015     Priority: Low     - evaluation w Ophthalmology Dr. Ferd Glassing /24/17:  this patient continues to do exceptionally well almost 13 years following resection of a right parietooccipital falcine meningioma. Prior to surgery, he had an inferior left homonymous defect that improved following surgery and has subsequently stabilized with minimal residual.Even though he has a moderate residual lesion abutting and occluding the posterior sagittal sinus, with slight compression of the medial aspect of the left occipital lobe, his examin today remains absolutely stable with no evidence of a worsening field defect and no evidence of papilledema or optic atrophy. He has a stable nevus in the right fundus. I will see him again in 1-2 years. In meantime, he has asked for a suggestion of an endocrinologist who deals with pituitary hormone dysfunction and he also may be interested in seeing a radiation therapist for discussion of stereotactic radiosurgery using a gamma knife (as opposed to the Cyberknife). I have recommended that he discuss these issues with [name] when he sees him tomorrow.     . Secondary male hypogonadism 01/24/2013     Priority: Low     - - 10/31/16: eval w Endocrine Dr. Tawanna Cooler [place] [facility]): testosterone level is excellent right now - continue current therapy         .  History of herniated intervertebral disc 01/17/2017     2005: developed R hip and leg pain and was told he had "small central disk herniation" at L5-S1. Surgery was not recommended. He received PT and some epidural corticosteroid injections which improved but did not completely resolve the situation. Low back and leg pain recurred in 2006 and was again tx w steroid and PT.      . Mild OSA  01/17/2017     - 10/31/16: eval w sleep medicine Dr. Ala Dach  [facility]): reviewed original records from sleep study in Sept 2017 and CPAP trial in Oct 2017 ... My opinion at this time his OSA is not sig enough to require a focused effort on using his CPAP consistently ... Particularly since he reports not having found CPAP to be helpful or restful, I think it is best to set it aside for the time being and at some point another sleep lab study and titration may be helpful. ... Depending on comorbidities this degree of OSA is worth an effort at treating but when there has not been any consistent benefit even in the past when he tried it  consistently, then it is reasonable to put on hold for the time being.      . Chronic diarrhea 01/17/2017     - eval w ID 11/21/16 Dr. Marcy Panning [facility]): has had extensive evaluation. I cannot identify an infectious disease that is likely to cause symptomatology. As he does have some cognitive decline and chronic diarrhea I will be checking a whipple PCR on blood although I think this is highly unlikely as he does not also have joint symptoms. A duodenal aspirate would be needed to confirm or completely rule out this diagnosis. I will also be checking for heavy metals. Given the amount of fish he consumes I am expecting to have high levels organic arsenic which are not toxic. . I will also be testing for stronglyloides which can be found in [place] and [place]. I will call if any of these tests are positive.      . Recurrent sinusitis 01/17/2017     - 11/21/16: eval w ENT Dr. Jannette Spanner  [facility]): For recurrent sinusitis, he is on good medical regiment w daily nasal irrigations. Surgery would be indicated for 4-5 episodes of sinusitis per year. He was not interested in operative intervention and this is reasonable. In re to his deviated septum septoplasty could be considered. He was not interested at this time. To address his nasal obstruction on a mucosal basis I recommended triple spray. Avoid afrin. Offered him fluoroscopic eval of his dysphagia but he deferred. Would not recommend surgery for OSA.      [name] Panhypopituitarism 03/29/2016   . Chronic, continuous use of opioids 11/09/2015     07/01/16: note from [facility] Dr. Toni Amend: Pt informed I will no longer refill percocet, clonazepam, or provigil as there are no clear disease indications. Pt upset and feels he needs these meds for his CF/FMS. I advised pt that he will need to have either rheum or pain specialist provide if they feel they are indicated. Pt has shown no evidence of abuse, but also has been on meds for several years and has shown no benefit in my opinion. Pt is currently being treated for panhypopit and adrenal insuff, hypogonadism, and hypothyroidism as a result, as wellas, growth hormone deficiency. Further analysis suggests polypharmacy with potential drug interactions. The pt knows how to wean clonazepam and has asked for 30days of tramadol to wean from the percocet. Note he is only on a half tablet of percocet qid. As a result will provide only 25mg  tabs to start qday and increase every third day to bid and tid max for 30 days total. No refills will be provided. Pt is aware.      [name] History of asthma 01/24/2013     - since childhood    - 11/23/16: eval w Dr. Renita Papa (Pulm at [facility]): bronchial asthma mild, allergic rhinitis postnasal drainage: uses albuterol BID for chest tightness. Check nitric oxide test. He has had intermittent eosinophilia and this points towards an eosinophilic asthma phenotype. He does have allergic rhinitis and  think he would be better servied with our triple combination nose spray which includes mometasone, diphenhydramine and iptratroprium. I gave him rx for this.      [name] BPH (benign prostatic hyperplasia) 01/24/2013     Past Medical History:   Diagnosis Date   . Avascular necrosis 2008    R hip - did not require surgery   . Breathlessness on exertion     eval at [facility] March 2018 - saw pulmonary -  PFT normal   . Cold intolerance    . Color blindness    . DJD (degenerative joint disease)     Lumbar / Sacral   . Dry eyes     uses restasis eyedrops which helps (RF, CCP, CRP and ESR have been wnl in past)   . Dry mouth    . Eczema 03/29/2016    Right thumb    . Encounter for pharmacogenetic testing 10/2015    MEDIMAP   . Hemorrhoids    . Lateral epicondylitis     Left   . Osteoporosis    . Skin neoplasm 10/21/2015   . Tinnitus     high pitched buzzing comes and goes - started after craniotomy for meningioma     Past Surgical History:   Procedure Laterality Date   . COLONOSCOPY  2008   . EXCISION, LESION  12/03/2008    benign lesion   . ORCHIOPEXY Left 1980    for intermittent torsion   . subtotal resection of posterior sagittal meningioma  12/05/2002    Partial brain resection secondary to meningioma at [facility]   . TONSILLECTOMY AND ADENOIDECTOMY  as a child     Immunization History   Administered Date(s) Administered   . INFLUENZA HIGH DOSE 11YRS+ 05/22/2014, 05/23/2015, 04/28/2016   . Influenza (Im) Preserved TRIVALENT VACCINE 05/23/2016   . Pneumococcal 23 valent 01/22/2013   . Pneumococcal Conjugate 13-Valent 11/06/2015   . Tdap 01/22/2013   . Zoster 06/25/2014     Health Maintenance   Topic Date Due   . Prescription Monitoring Program  [birth year]   . Contract Prescription  Feb 11, 1951   . Shingrix Vaccine 50+ (1) 05/26/1999   . Medicare Annual Wellness Visit  11/04/2016   . INFLUENZA VACCINE  04/22/2017   . FALLS RISK ANNUAL  01/06/2018   . DEPRESSION SCREENING  01/18/2018   . Pneumonia Vaccine Age 18+ (2 of 2 - PPSV23)  01/22/2018   . Colon Cancer Screening Cologuard  12/17/2018   . COLONOSCOPY TEN YEARS  01/19/2027   :  Current Outpatient Prescriptions   Medication Sig Dispense Refill   . amLODIPine (NORVASC) 10 MG tablet Take 10 mg by mouth daily.     . ANDROGEL 20.25 MG/1.25GM (1.62%) Gel   1   . aspirin 81 MG EC tablet Take 81 mg by mouth daily.       . Calcium Carbonate-Vitamin D (CALCIUM + D PO) Take by mouth. 500/400mg   daily      . carvedilol (COREG) 6.25 MG tablet Take 6.25 mg by mouth nightly.         . clonazePAM (KLONOPIN) 0.5 MG tablet Take 0.25 mg by mouth daily as needed.      2   . Coenzyme Q10 100 MG capsule Take 100 mg by mouth daily.     . cycloSPORINE (RESTASIS) 0.05 % ophthalmic emulsion Place 1 drop into both eyes every 12 (twelve) hours. 5.5 mL 4   . donepezil (ARICEPT) 10 MG tablet Take 5 mg by mouth every morning.         . ezetimibe (ZETIA) 10 MG tablet Take one tablet by mouth daily 90 tablet 2   . fenofibrate (TRICOR) 145 MG tablet Take 1 tablet (145 mg total) by mouth daily. 90 tablet 3   . FLOVENT HFA 110 MCG/ACT inhaler Inhale 2 puffs into the lungs 2 (two) times daily.         . hydrocortisone (CORTEF) 10 MG tablet 1.5 tab  QAM and .5 QPM - per [facility]  3   . ketoconazole (NIZORAL) 2 % cream Apply 1 application topically daily.for 14 days 14 g 0   . levothyroxine (SYNTHROID, LEVOTHROID) 150 MCG tablet Take 1 tablet (150 mcg total) by mouth Once a day at 6:00am. 90 tablet 3   . montelukast (SINGULAIR) 10 MG tablet Take 1 tablet (10 mg total) by mouth nightly. 90 tablet 3   . Multiple Vitamins-Minerals (CENTRUM SILVER PO) Take 1 tablet by mouth daily.       . NUTROPIN AQ NUSPIN 10 10 MG/2ML Solution 0.4 mg SQ daily - per [facility] (Patient taking differently: Inject 0.3 mg into the skin daily.0.4 mg SQ daily - per [facility]    )     . pyridoxine (B-6) 100 MG tablet Take 100 mg by mouth daily.     . ramipril (ALTACE) 10 MG capsule Take 2 capsules (20 mg total) by mouth daily. (Patient taking differently: Take 20 mg  by mouth 2 (two) times daily.    ) 180 capsule 3   . simvastatin (ZOCOR) 20 MG tablet TAKE ONE TABLET BY MOUTH NIGHTLY AS DIRECTED 90 tablet 2   . testosterone (ANDROGEL) 25 MG/2.5GM (1%) Gel 2.5 mg daily.      1   . traMADol (ULTRAM) 50 MG tablet 1/2 tab daily - per [facility] 20 tablet 0   . vitamin B-12 (CYANOCOBALAMIN) 1000 MCG tablet Take 1 tablet (1,000 mcg total) by mouth daily. 30 tablet 11   . VITAMIN D, CHOLECALCIFEROL, PO Take 5,000 Units by mouth daily.        . mirtazapine (REMERON) 15 MG tablet Take 15 mg by mouth nightly.     . nystatin (MYCOSTATIN) 100000 UNIT/ML suspension 400,000 units / 4ml 4 times/day; swish in mouth and retain for as long as possible (several minutes) before swallowing 60 mL 0     No current facility-administered medications for this visit.      Allergies   Allergen Reactions   . Bextra [Valdecoxib] Rash     Social History     Social History   . Marital status: Married     Spouse name: N/A   . Number of children: N/A   . Years of education: N/A     Occupational History   . Not on file.     Social History Main Topics   . Smoking status: Never Smoker   . Smokeless tobacco: Never Used   . Alcohol use No      Comment: rare   . Drug use: Yes      Comment: uses MJ for pain daily   . Sexual activity: Not on file     Other Topics Concern   . Not on file     Social History Narrative    He lives in [place]. From [facility]. Married - wife Angelique Blonder.Two daughters (one lives in [place] and one in [place]). Retired Web designer - retired in 2016.     Family History   Problem Relation Age of Onset   . Asthma Daughter    . Tuberculosis Mother    . Dementia Mother    . Migraines Sister        Review of Systems  CONST: No weight change, no fevers/chills or sweats, fatigue at baseline  MOUTH: No sore throat, oral lesions, difficulty swallowing  CV: No chest pain, palpitations, leg swelling  RESP: No shortness of breath, cough  PSYCH:  Denies depressed mood today  Physical Exam:  BP 149/79   Pulse 70   Temp  97.7 F (36.5 C) (Oral)   Resp 18   SpO2 98%    Wt Readings from Last 3 Encounters:   01/03/17 86.6 kg (191 lb)   12/20/16 87.2 kg (192 lb 3.2 oz)   11/05/15 82.5 kg (181 lb 14.4 oz)     GEN: well developed well nourished male alert and appropriate in no apparent distress  HENT: tympanic membrane normal bilaterally, no nasal congestion, oropharynx normal w no clearly evident exudate or erythema  EYES: PERRL, EOMI, no pallor or scleral icterus, normal conjunctiva  NECK: supple, no thyromegaly or nodules appreciated  LYMPH: no cervical or supraclavicular lymphadenopathy appreciated  CV: RRR, no m/r/g.  No LE edema.  2+ radial pulses present and equal.  RESP: CTAB, normal effort, no rhonchi or rales  PSYCH: normal mood and affect    Assessment/Plan:    1. Oral thrush  Pt reports he has used "magic mouthwash" in the past for this with improvement. Trial of nystatin.   - nystatin (MYCOSTATIN) 100000 UNIT/ML suspension; 400,000 units / 4ml 4 times/day; swish in mouth and retain for as long as possible (several minutes) before swallowing  Dispense: 60 mL; Refill: 0    2. Essential hypertension  BP mildly elevated and pt reports he will be in touch w [facility] in re to coreg titration.     3. History of insomnia  Hold remeron for now but advised him that this might be beneficial for sleep and mood if he can tolerate this (as rec by psychiatry)    4. Amnestic MCI (mild cognitive impairment with memory loss)  Continue donepezil and he is tolerating lower dose - once stable on this consider starting remeron as rec by his physicians at [facility]    I reviewed his medications and noted his use of Vitamin E  Reviewed risks and benefits with him and advised him to stop taking this supplement due to risk and uncertain benefit    Risks & benefits of the new medication(s) were explained to the patient, who appeared to understand and agrees to the treatment plan.    Gloriajean Dell, MD  [facility]  [address]  [place], [place]  [address]  P) [phone]  F) [phone]  [email or web address]

## 2017-01-21 ENCOUNTER — Other Ambulatory Visit: Payer: Self-pay | Admitting: Internal Medicine

## 2017-01-21 DIAGNOSIS — B370 Candidal stomatitis: Secondary | ICD-10-CM

## 2017-01-27 ENCOUNTER — Other Ambulatory Visit: Payer: Self-pay | Admitting: Internal Medicine

## 2017-01-27 ENCOUNTER — Encounter: Payer: Self-pay | Admitting: Internal Medicine

## 2017-01-27 DIAGNOSIS — B370 Candidal stomatitis: Secondary | ICD-10-CM

## 2017-01-27 MED ORDER — NYSTATIN 100000 UNIT/ML MT SUSP
OROMUCOSAL | 3 refills | Status: DC
Start: 2017-01-27 — End: 2017-09-13

## 2017-02-16 ENCOUNTER — Encounter: Payer: Self-pay | Admitting: Internal Medicine

## 2017-02-16 ENCOUNTER — Other Ambulatory Visit: Payer: Self-pay | Admitting: Internal Medicine

## 2017-02-16 DIAGNOSIS — J4540 Moderate persistent asthma, uncomplicated: Secondary | ICD-10-CM

## 2017-02-16 DIAGNOSIS — E785 Hyperlipidemia, unspecified: Secondary | ICD-10-CM

## 2017-02-16 DIAGNOSIS — G4700 Insomnia, unspecified: Secondary | ICD-10-CM

## 2017-02-16 DIAGNOSIS — H04123 Dry eye syndrome of bilateral lacrimal glands: Secondary | ICD-10-CM

## 2017-02-16 MED ORDER — SIMVASTATIN 20 MG PO TABS
ORAL_TABLET | ORAL | 3 refills | Status: DC
Start: 2017-02-16 — End: 2017-11-29

## 2017-02-16 MED ORDER — MIRTAZAPINE 15 MG PO TABS
15.0000 mg | ORAL_TABLET | Freq: Every evening | ORAL | 1 refills | Status: DC
Start: 2017-02-16 — End: 2017-06-22

## 2017-02-16 MED ORDER — CYCLOSPORINE 0.05 % OP EMUL
1.0000 [drp] | Freq: Two times a day (BID) | OPHTHALMIC | 4 refills | Status: DC
Start: 2017-02-16 — End: 2018-02-13

## 2017-02-16 NOTE — Telephone Encounter (Signed)
Replied in the earlier message and refilled to 90 with 1 refill    Otis Brace, MD

## 2017-03-02 ENCOUNTER — Encounter (INDEPENDENT_AMBULATORY_CARE_PROVIDER_SITE_OTHER): Payer: Self-pay

## 2017-03-11 ENCOUNTER — Emergency Department
Admission: EM | Admit: 2017-03-11 | Discharge: 2017-03-11 | Disposition: A | Discharge status: Home / Self Care | Payer: Medicare Other | Attending: Emergency Medicine | Admitting: Emergency Medicine

## 2017-03-11 ENCOUNTER — Emergency Department: Payer: Medicare Other

## 2017-03-11 DIAGNOSIS — Z7982 Long term (current) use of aspirin: Secondary | ICD-10-CM | POA: Insufficient documentation

## 2017-03-11 DIAGNOSIS — R110 Nausea: Secondary | ICD-10-CM | POA: Insufficient documentation

## 2017-03-11 DIAGNOSIS — Z79899 Other long term (current) drug therapy: Secondary | ICD-10-CM | POA: Insufficient documentation

## 2017-03-11 DIAGNOSIS — M810 Age-related osteoporosis without current pathological fracture: Secondary | ICD-10-CM | POA: Insufficient documentation

## 2017-03-11 DIAGNOSIS — I10 Essential (primary) hypertension: Secondary | ICD-10-CM | POA: Insufficient documentation

## 2017-03-11 DIAGNOSIS — E785 Hyperlipidemia, unspecified: Secondary | ICD-10-CM | POA: Insufficient documentation

## 2017-03-11 DIAGNOSIS — D329 Benign neoplasm of meninges, unspecified: Secondary | ICD-10-CM | POA: Insufficient documentation

## 2017-03-11 LAB — CBC AND DIFFERENTIAL
Absolute NRBC: 0 10*3/uL
Basophils Absolute Automated: 0.04 10*3/uL (ref 0.00–0.20)
Basophils Automated: 0.5 %
Eosinophils Absolute Automated: 0.09 10*3/uL (ref 0.00–0.70)
Eosinophils Automated: 1.1 %
Hematocrit: 42 % (ref 42.0–52.0)
Hgb: 13.8 g/dL (ref 13.0–17.0)
Immature Granulocytes Absolute: 0.04 10*3/uL
Immature Granulocytes: 0.5 %
Lymphocytes Absolute Automated: 2.29 10*3/uL (ref 0.50–4.40)
Lymphocytes Automated: 28.4 %
MCH: 29.8 pg (ref 28.0–32.0)
MCHC: 32.9 g/dL (ref 32.0–36.0)
MCV: 90.7 fL (ref 80.0–100.0)
MPV: 11.6 fL (ref 9.4–12.3)
Monocytes Absolute Automated: 0.61 10*3/uL (ref 0.00–1.20)
Monocytes: 7.6 %
Neutrophils Absolute: 5 10*3/uL (ref 1.80–8.10)
Neutrophils: 61.9 %
Nucleated RBC: 0 /100 WBC (ref 0.0–1.0)
Platelets: 287 10*3/uL (ref 140–400)
RBC: 4.63 10*6/uL — ABNORMAL LOW (ref 4.70–6.00)
RDW: 13 % (ref 12–15)
WBC: 8.07 10*3/uL (ref 3.50–10.80)

## 2017-03-11 LAB — COMPREHENSIVE METABOLIC PANEL
ALT: 31 U/L (ref 0–55)
AST (SGOT): 28 U/L (ref 5–34)
Albumin/Globulin Ratio: 1.6 (ref 0.9–2.2)
Albumin: 4.5 g/dL (ref 3.5–5.0)
Alkaline Phosphatase: 43 U/L (ref 38–106)
BUN: 29 mg/dL — ABNORMAL HIGH (ref 9.0–28.0)
Bilirubin, Total: 0.7 mg/dL (ref 0.2–1.2)
CO2: 26 mEq/L (ref 22–29)
Calcium: 10 mg/dL (ref 8.5–10.5)
Chloride: 105 mEq/L (ref 100–111)
Creatinine: 1.4 mg/dL — ABNORMAL HIGH (ref 0.7–1.3)
Globulin: 2.8 g/dL (ref 2.0–3.6)
Glucose: 122 mg/dL — ABNORMAL HIGH (ref 70–100)
Potassium: 4.5 mEq/L (ref 3.5–5.1)
Protein, Total: 7.3 g/dL (ref 6.0–8.3)
Sodium: 141 mEq/L (ref 136–145)

## 2017-03-11 LAB — LIPASE: Lipase: 35 U/L (ref 8–78)

## 2017-03-11 LAB — GFR: EGFR: 50.4

## 2017-03-11 MED ORDER — PROMETHAZINE HCL 25 MG PO TABS
25.0000 mg | ORAL_TABLET | Freq: Four times a day (QID) | ORAL | 0 refills | Status: DC | PRN
Start: 2017-03-11 — End: 2018-02-13

## 2017-03-11 MED ORDER — METOCLOPRAMIDE HCL 5 MG PO TABS
10.0000 mg | ORAL_TABLET | Freq: Four times a day (QID) | ORAL | 0 refills | Status: DC | PRN
Start: 2017-03-11 — End: 2017-03-13

## 2017-03-11 NOTE — Discharge Instructions (Signed)
Dear [name]:    Thank you for choosing the [facility] Emergency Department, the premier emergency department in the [place] area.  I hope your visit today was EXCELLENT.    Specific instructions for your visit today:    Taken Phenergan or Reglan for relief as needed but NOT together. Follow up with Dr. Toni Amend for evaluation at the end of the week. Please return for intractable nausea, headache, and ability to tolerate PO.    Nausea    You have been seen for nausea.    Nausea is the feeling that you are going to vomit (throw up). Nausea is not a disease. It is a symptom of another problem. For example, nausea, vomiting and diarrhea are symptoms of a stomach virus (the "stomach flu").    You may or may not vomit when you have nausea.     The nausea itself is not dangerous but it can be very uncomfortable.    We might not be able to find out today what is causing your nausea. It is VERY IMPORTANT to see your doctor who can watch for any serious problems.    There are many treatments for nausea. The medical staff will discuss these with you. Some common medicines used to help with nausea are   Promethazine (Phenergan), prochlorperazine (Compazine), metoclopramide (Reglan), ondansetron (Zofran), and many others.    Follow a clear liquid diet. Drink water, broth, 7-Up, Sprite, or other clear caffeine-free soft drinks or sports drinks until you feel better. This might help the nausea and keep you from vomiting.     If your nausea lasts for longer than a few days, or if you have new symptoms, we STRONGLY RECOMMEND that you go to see your family doctor, specialist or clinic. If you cannot get an appointment or do not have a doctor you can always return here or go to the nearest emergency department to be seen again.    YOU SHOULD SEEK MEDICAL ATTENTION IMMEDIATELY, EITHER HERE OR AT THE NEAREST EMERGENCY DEPARTMENT, IF ANY OF THE FOLLOWING OCCURS:   You vomit often.   You have abdominal (belly)  pain.   You vomit blood or anything that looks like coffee grounds.   You have a headache.   You have any new symptoms or concerns.   You feel more "unwell."            If you do not continue to improve or your condition worsens, please contact your doctor or return immediately to the Emergency Department.    Sincerely,  Alcide Evener,*  Attending Emergency Physician  [facility] Emergency Department    ONSITE PHARMACY  Our full service onsite pharmacy is located in the ER waiting room.  Open 7 days a week from 9 am to 11 pm.  We accept all major insurances and prices are competitive with major retailers.  Ask your provider to print your prescriptions down to the pharmacy to speed you on your way home.    OBTAINING A PRIMARY CARE APPOINTMENT    Primary care physicians (PCPs, also known as primary care doctors) are either internists or family medicine doctors. Both types of PCPs focus on health promotion, disease prevention, patient education and counseling, and treatment of acute and chronic medical conditions.    Call for an appointment with a primary care doctor.  Ask to see who is taking new patients.     [facility]  telephone:  [phone]  [email or web address]  DOCTOR REFERRALS  Call [phone] (available 24 hours a day, 7 days a week) if you need any further referrals and we can help you find a primary care doctor or specialist.  Also, available online at:  [email or web address]    YOUR CONTACT INFORMATION  Before leaving please check with registration to make sure we have an up-to-date contact number.  You can call registration at [phone] to update your information.  For questions about your hospital bill, please call [phone].  For questions about your Emergency Dept Physician bill please call [phone].      FREE HEALTH SERVICES  If you need help with health or social services, please call 2-1-1 for a free referral to resources in your  area.  2-1-1 is a free service connecting people with information on health insurance, free clinics, pregnancy, mental health, dental care, food assistance, housing, and substance abuse counseling.  Also, available online at:  http://www.[facility].org    MEDICAL RECORDS AND TESTS  Certain laboratory test results do not come back the same day, for example urine cultures.   We will contact you if other important findings are noted.  Radiology films are often reviewed again to ensure accuracy.  If there is any discrepancy, we will notify you.      Please call [phone] to pick up a complimentary CD of any radiology studies performed.  If you or your doctor would like to request a copy of your medical records, please call [phone].      ORTHOPEDIC INJURY   Please know that significant injuries can exist even when an initial x-Author is read as normal or negative.  This can occur because some fractures (broken bones) are not initially visible on x-rays.  For this reason, close outpatient follow-up with your primary care doctor or bone specialist (orthopedist) is required.    MEDICATIONS AND FOLLOWUP  Please be aware that some prescription medications can cause drowsiness.  Use caution when driving or operating machinery.    The examination and treatment you have received in our Emergency Department is provided on an emergency basis, and is not intended to be a substitute for your primary care physician.  It is important that your doctor checks you again and that you report any new or remaining problems at that time.      24 HOUR PHARMACIES  The nearest 24 hour pharmacy is:    [facility]  [address]  [place], [place] [address]  [phone]      ASSISTANCE WITH INSURANCE    Affordable Care Act  [facility])  Call to start or finish an application, compare plans, enroll or ask a question.  [phone]  TTY: [phone]  Web:  Healthcare.gov    Help Enrolling in [facility]  [facility] [place]  [phone] (TOLL-FREE)  [phone] (TTY)  Web:  Http://www.coverva.org    Local Help Enrolling in the [facility]  [place] [facility]  [phone] (MAIN)  Email:  health-help@nvfs .org  Web:  BlackjackMyths.is  Address:  [address] [place], [place] [address]    SEDATING MEDICATIONS  Sedating medications include strong pain medications (e.g. narcotics), muscle relaxers, benzodiazepines (used for anxiety and as muscle relaxers), Benadryl/diphenhydramine and other antihistamines for allergic reactions/itching, and other medications.  If you are unsure if you have received a sedating medication, please ask your physician or nurse.  If you received a sedating medication: DO NOT drive a car. DO NOT operate machinery. DO NOT perform jobs where  you need to be alert.  DO NOT drink alcoholic beverages while taking this medicine.     If you get dizzy, sit or lie down at the first signs. Be careful going up and down stairs.  Be extra careful to prevent falls.     Never give this medicine to others.     Keep this medicine out of reach of children.     Do not take or save old medicines. Throw them away when outdated.     Keep all medicines in a cool, dry place. DO NOT keep them in your bathroom medicine cabinet or in a cabinet above the stove.    MEDICATION REFILLS  Please be aware that we cannot refill any prescriptions through the ER. If you need further treatment from what is provided at your ER visit, please follow up with your primary care doctor or your pain management specialist.    [place]  Did you know [facility] has two freestanding ERs located just a few miles away?  [place] [facility] [place] [facility] [place] have short wait times, easy free parking directly in front of the building and top patient satisfaction scores - and the same Board Certified Emergency Medicine doctors as [facility].

## 2017-03-11 NOTE — ED Provider Notes (Signed)
[facility] EMERGENCY DEPARTMENT H&P      Visit date: 03/11/2017      CLINICAL SUMMARY           Diagnosis:    .     Final diagnoses:   Nausea         MDM Notes:      Cerebral edema vs progression of meningioma vs dehydration vs electrolyte abnormality         Disposition:      Discharge         Discharge Prescriptions     Medication Sig Dispense Auth. Provider    promethazine (PHENERGAN) 25 MG tablet Take 1 tablet (25 mg total) by mouth every 6 (six) hours as needed for Nausea.for up to 12 doses 12 tablet Vianny Schraeder T, MD    metoclopramide (REGLAN) 5 MG tablet Take 2 tablets (10 mg total) by mouth 4 (four) times daily as needed (nausea).for up to 12 doses 12 tablet Anael Rosch, Lu Duffel, MD                      CLINICAL INFORMATION        HPI:      Chief Complaint: Nausea  .    Tayshon A. 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 Today the patient's symptoms were a/w elevated BP. States that he awakes in the AM with a gnawing sensation in his abd and about an hour after rousing, the patient develops nausea. Nausea improves with rest. Gradually over the day, the patient states that nausea resolves (~1400-1500). Taking 25 mg phenergan with some improvement. Also taking hydrocortisone. Normal BM for patient. Denies abdominal pain, vomiting, weakness, visual changes. No falls, trauma, no recent travel.    History obtained from: Patient        ROS:      Positive and negative ROS elements as per HPI.  All other systems reviewed and negative.      Physical Exam:      Pulse 70  BP 172/85  Resp 16  SpO2 96 %  Temp 97.6 F (36.4 C)    Physical Exam   Constitutional: He is oriented to person, place, and time. He appears well-developed and well-nourished. No distress.   Appears uncomfortable, hypertensive.   HENT:   Head: Normocephalic and atraumatic.   Nose: Nose normal.    Mouth/Throat: Oropharynx is clear and moist.   Eyes: Pupils are equal, round, and reactive to light. Conjunctivae are normal. No scleral icterus.   Pupils 5mm.   Neck: Normal range of motion. Neck supple. No tracheal deviation present.   Cardiovascular: Normal rate, regular rhythm, normal heart sounds and intact distal pulses.  Exam reveals no friction rub.    No murmur heard.  Pulmonary/Chest: Effort normal and breath sounds normal. No stridor. No respiratory distress. He has no wheezes.   Abdominal: Soft. There is no tenderness. There is no rebound and no guarding.   Musculoskeletal: Normal range of motion. He exhibits no edema or tenderness.   Neurological: He is alert and oriented to person, place, and time. He exhibits normal muscle tone. Coordination normal.   No focal weakness.   Skin: Skin is warm and dry. No rash noted. He is not diaphoretic. No erythema.   Psychiatric: He has a normal mood and affect. His behavior is normal. Judgment and thought content  normal.   Nursing note and vitals reviewed.                 PAST HISTORY        Primary Care Provider: [name], MD        PMH/PSH:    .     Past Medical History:   Diagnosis Date   . Avascular necrosis 2008    R hip - did not require surgery   . Breathlessness on exertion     eval at [facility] March 2018 - saw pulmonary - PFT normal   . Cold intolerance    . Color blindness    . DJD (degenerative joint disease)     Lumbar / Sacral   . Dry eyes     uses restasis eyedrops which helps (RF, CCP, CRP and ESR have been wnl in past)   . Dry mouth    . Eczema 03/29/2016    Right thumb    . Encounter for pharmacogenetic testing 10/2015    [facility]   . Hemorrhoids    . Lateral epicondylitis     Left   . Osteoporosis    . Skin neoplasm 10/21/2015   . Tinnitus     high pitched buzzing comes and goes - started after craniotomy for meningioma       He has a past surgical history that includes EXCISION, LESION (12/03/2008); Colonoscopy (2008); Tonsillectomy and  adenoidectomy (as a child); Orchiopexy (Left, 1980); and Subtotal resection of posterior sagittal meningioma (12/05/2002).      Social/Family History:      He reports that he has never smoked. He has never used smokeless tobacco. He reports that he uses drugs. He reports that he does not drink alcohol.    Family History   Problem Relation Age of Onset   . Asthma Daughter    . Tuberculosis Mother    . Dementia Mother    . Migraines Sister          Listed Medications on Arrival:    .     Home Medications     Med List Status:  In Progress Set By: Howell Pringle, RN at 03/11/2017 11:20 AM                amLODIPine (NORVASC) 10 MG tablet     Take 10 mg by mouth daily.     ANDROGEL 20.25 MG/1.25GM (1.62%) Gel          aspirin 81 MG EC tablet     Take 81 mg by mouth daily.       Calcium Carbonate-Vitamin D (CALCIUM + D PO)     Take by mouth. 500/400mg   daily      carvedilol (COREG) 6.25 MG tablet     Take 6.25 mg by mouth nightly.         clonazePAM (KLONOPIN) 0.5 MG tablet     Take 0.25 mg by mouth daily as needed.         Coenzyme Q10 100 MG capsule     Take 100 mg by mouth daily.     cycloSPORINE (RESTASIS) 0.05 % ophthalmic emulsion     Place 1 drop into both eyes every 12 (twelve) hours.     donepezil (ARICEPT) 10 MG tablet     Take 5 mg by mouth every morning.         ezetimibe (ZETIA) 10 MG tablet     Take one tablet by mouth  daily     fenofibrate (TRICOR) 145 MG tablet     Take 1 tablet (145 mg total) by mouth daily.     FLOVENT HFA 110 MCG/ACT inhaler     Inhale 2 puffs into the lungs 2 (two) times daily.         hydrocortisone (CORTEF) 10 MG tablet     1.5 tab QAM and .5 QPM - per [facility]     levothyroxine (SYNTHROID, LEVOTHROID) 150 MCG tablet     Take 1 tablet (150 mcg total) by mouth Once a day at 6:00am.     mirtazapine (REMERON) 15 MG tablet     Take 1 tablet (15 mg total) by mouth nightly.     montelukast (SINGULAIR) 10 MG tablet     Take 1 tablet (10 mg total) by mouth nightly.     Multiple  Vitamins-Minerals (CENTRUM SILVER PO)     Take 1 tablet by mouth daily.       NUTROPIN AQ NUSPIN 10 10 MG/2ML Solution     0.4 mg SQ daily - per [facility]     Patient taking differently:  Inject 0.3 mg into the skin daily.0.4 mg SQ daily - per [facility]         nystatin (MYCOSTATIN) 100000 UNIT/ML suspension     SWISH IN MOUTH AND RETAIN FOR AS LONG AS POSSIBLE (SEVERAL MINUTES) BEFORE SWALLOWING 4 TIMES DAILY     pyridoxine (B-6) 100 MG tablet     Take 100 mg by mouth daily.     ramipril (ALTACE) 10 MG capsule     Take 2 capsules (20 mg total) by mouth daily.     Patient taking differently:  Take 20 mg by mouth 2 (two) times daily.         simvastatin (ZOCOR) 20 MG tablet     TAKE ONE TABLET BY MOUTH NIGHTLY     testosterone (ANDROGEL) 25 MG/2.5GM (1%) Gel     2.5 mg daily.         traMADol (ULTRAM) 50 MG tablet     1/2 tab daily - per [facility]     vitamin B-12 (CYANOCOBALAMIN) 1000 MCG tablet     Take 1 tablet (1,000 mcg total) by mouth daily.     VITAMIN D, CHOLECALCIFEROL, PO     Take 5,000 Units by mouth daily.            Allergies: He is allergic to bextra [valdecoxib].            VISIT INFORMATION        Clinical Course in the ED:      12:00 pm  Re-eval note: patient comfortable, denies nausea currently. CT neg for progression/bleed, does not wish to be admitted, wishes to go home and f/u with PMD, will return if symptoms worsen. "If my symptoms worsen, I'm going to go to the [facility] clinic". Return instructions given.    12:45 pm  Discussed with Dr. Toni Amend. Aware of patient. Agrees with plan. Will follow up.           Medications Given in the ED:    .     ED Medication Orders     None            Procedures:      Procedures      Interpretations:      O2 sat-           saturation: 96 %; Oxygen use: room air; Interpretation:  Normal                 RESULTS        Lab Results:      Results     Procedure Component Value Units Date/Time    Comprehensive metabolic panel [[id number]]  (Abnormal) Collected:  03/11/17 1134     Specimen:  Blood Updated:  03/11/17 1204     Glucose 122 (H) mg/dL      BUN 40.9 (H) mg/dL      Creatinine 1.4 (H) mg/dL      Sodium 811 mEq/L      Potassium 4.5 mEq/L      Chloride 105 mEq/L      CO2 26 mEq/L      Calcium 10.0 mg/dL      Protein, Total 7.3 g/dL      Albumin 4.5 g/dL      AST (SGOT) 28 U/L      ALT 31 U/L      Alkaline Phosphatase 43 U/L      Bilirubin, Total 0.7 mg/dL      Globulin 2.8 g/dL      Albumin/Globulin Ratio 1.6    Lipase [[id number]] Collected:  03/11/17 1134    Specimen:  Blood Updated:  03/11/17 1204     Lipase 35 U/L     GFR [[id number]] Collected:  03/11/17 1134     Updated:  03/11/17 1204     EGFR 50.4    CBC with differential [[id number]]  (Abnormal) Collected:  03/11/17 1134    Specimen:  Blood from Blood Updated:  03/11/17 1151     WBC 8.07 x10 3/uL      Hgb 13.8 g/dL      Hematocrit 41.3 %      Platelets 287 x10 3/uL      RBC 4.63 (L) x10 6/uL      MCV 90.7 fL      MCH 29.8 pg      MCHC 32.9 g/dL      RDW 13 %      MPV 11.6 fL      Neutrophils 61.9 %      Lymphocytes Automated 28.4 %      Monocytes 7.6 %      Eosinophils Automated 1.1 %      Basophils Automated 0.5 %      Immature Granulocyte 0.5 %      Nucleated RBC 0.0 /100 WBC      Neutrophils Absolute 5.00 x10 3/uL      Abs Lymph Automated 2.29 x10 3/uL      Abs Mono Automated 0.61 x10 3/uL      Abs Eos Automated 0.09 x10 3/uL      Absolute Baso Automated 0.04 x10 3/uL      Absolute Immature Granulocyte 0.04 x10 3/uL      Absolute NRBC 0.00 x10 3/uL               Radiology Results:      CT Head WO Contrast   Final Result      1. No acute intracranial abnormality.   2. Partially calcified extra-axial mass centered upon the posterior   aspect of the falx cerebri which invades the superior sagittal sinus is   similar in configuration compared to the previous MRI study of   06/07/2016 and is consistent with meningioma.      Georgann Housekeeper, MD    03/11/2017 11:39 AM  Scribe Attestation:      I was acting as a Neurosurgeon  for [employer], Lu Duffel, MD found on Raymondo Band., MD  Treatment Team: Scribe: Audie Clear     I am the first provider for this patient and I personally performed the services documented. Treatment Team: Scribe: Audie Clear is scribing for me on [facility] A., MD. This note and the patient instructions accurately reflect work and decisions made by me.  Alcide Evener, MD          Alcide Evener, MD  03/12/17 [phone]

## 2017-03-13 ENCOUNTER — Encounter: Payer: Self-pay | Admitting: Internal Medicine

## 2017-03-13 ENCOUNTER — Other Ambulatory Visit: Payer: Self-pay | Admitting: Internal Medicine

## 2017-03-13 DIAGNOSIS — R110 Nausea: Secondary | ICD-10-CM | POA: Insufficient documentation

## 2017-03-13 MED ORDER — METOCLOPRAMIDE HCL 5 MG PO TABS
5.0000 mg | ORAL_TABLET | Freq: Four times a day (QID) | ORAL | 0 refills | Status: DC | PRN
Start: 2017-03-13 — End: 2017-06-22

## 2017-03-24 ENCOUNTER — Encounter: Payer: Self-pay | Admitting: Internal Medicine

## 2017-03-30 ENCOUNTER — Encounter (INDEPENDENT_AMBULATORY_CARE_PROVIDER_SITE_OTHER): Payer: Self-pay

## 2017-04-30 ENCOUNTER — Encounter (INDEPENDENT_AMBULATORY_CARE_PROVIDER_SITE_OTHER): Payer: Self-pay

## 2017-05-22 ENCOUNTER — Ambulatory Visit (INDEPENDENT_AMBULATORY_CARE_PROVIDER_SITE_OTHER): Payer: Medicare Other

## 2017-05-22 DIAGNOSIS — Z09 Encounter for follow-up examination after completed treatment for conditions other than malignant neoplasm: Secondary | ICD-10-CM

## 2017-05-22 NOTE — Progress Notes (Signed)
Testing: Please see scanned lab order from [facility] in patient's chart. Labs drawn using aseptic technique from right [facility] without any problems. Patient tolerated well. Centrifuged and separated labs per [facility] order instructions. Included date and time on all labels and on [facility] order per instructions. Patient took specimen box with him and advised that he would take box to [employer] to mail out.

## 2017-05-24 ENCOUNTER — Other Ambulatory Visit: Payer: Self-pay | Admitting: Internal Medicine

## 2017-05-24 DIAGNOSIS — E782 Mixed hyperlipidemia: Secondary | ICD-10-CM

## 2017-05-24 NOTE — Telephone Encounter (Signed)
Will forward to pt's PCP.    Lance Muss, M.D.  [address]  [place], [place]. [address]  Office [phone]  Cell phone [phone]  05/24/2017  8:09 AM

## 2017-05-30 ENCOUNTER — Encounter (INDEPENDENT_AMBULATORY_CARE_PROVIDER_SITE_OTHER): Payer: Self-pay

## 2017-06-20 ENCOUNTER — Telehealth: Payer: Self-pay

## 2017-06-20 ENCOUNTER — Encounter: Payer: Self-pay | Admitting: Internal Medicine

## 2017-06-20 NOTE — Telephone Encounter (Signed)
From: [name]   Sent: Tuesday, June 20, 2017 12:25 PM  To: '[email or web address]'  Subject: Jury Duty Letter     Good afternoon Dr. [name],    Attached is the letter from Dr. Orlin Hilding regarding jury duty.    Thank you,   [name], RN, BSN   [facility] [address], [place], [place] [address]  T [phone]  [name] [phone]           This communication may contain confidential and/or privileged information. Additionally, this communication may contain protected health information (PHI) that is legally protected from inappropriate disclosure by the Privacy Standards of the [employer] Portability and Accountability Act (HIPAA) and relevant [employer]. If you are not the intended recipient, please note that any dissemination, distribution or copying of this communication is strictly prohibited. If you have received this message in error, you should notify the sender immediately by telephone or by return e-mail and delete this message from your computer. Direct questions to the Personal assistant at [phone].

## 2017-06-21 NOTE — Telephone Encounter (Signed)
Appointment scheduled by Einar Gip at [facility] on June 22, 2017.     From: [name] [[email or web address]]   Sent: Wednesday, June 21, 2017 12:34 PM  To: [name]  Subject: Re: Payton Mccallum Duty Letter     Lanier Ensign,  Yes, I would like to set up an appointment   Late morning or early afternoon is best for me.  Thanks,  [employer] from my iPhone    On Jun 21, 2017, at 11:54 AM, Johnanna Bakke, Boneta Lucks [email or web address] wrote:  Good Morning Dr. [name],     I spoke with Dr. Orlin Hilding and he understands that [facility] has been working closely with you. Dr. Orlin Hilding is happy to see you in the office and maybe do some blood work, if you would like. If you would like to set this up, please let me know and I will contact [facility] to get you scheduled. Dr. Orlin Hilding is in the [facility] office on Tuesdays and Thursdays.      Thank you,  Boneta Lucks      From: [name] [[email or web address]]   Sent: Tuesday, June 20, 2017 6:20 PM  To: [name]  Subject: Re: Payton Mccallum Duty Letter      Lanier Ensign,  Does Dr. Orlin Hilding want me to make an appointment to see him?  Thanks,  [employer] from my iPhone

## 2017-06-22 ENCOUNTER — Ambulatory Visit (INDEPENDENT_AMBULATORY_CARE_PROVIDER_SITE_OTHER): Payer: Medicare Other | Admitting: Internal Medicine

## 2017-06-22 DIAGNOSIS — E782 Mixed hyperlipidemia: Secondary | ICD-10-CM

## 2017-06-22 DIAGNOSIS — R5382 Chronic fatigue, unspecified: Secondary | ICD-10-CM

## 2017-06-22 DIAGNOSIS — M797 Fibromyalgia: Secondary | ICD-10-CM

## 2017-06-22 NOTE — Progress Notes (Addendum)
Chief Complaint   Patient presents with   . follow up       HPI    Shannon A. 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 Toni Amend and myself here for evaluation and treatment of the following concerns.     Today the patient is mainly here to talk about his chronic pain.  He carries a diagnosis from previous providers of chronic pain and fibromyalgia syndrome.  He has been extensively evaluated in the past for this and other conditions by the [facility].  He has another annual examination with [facility] coming up in the next 5 months.  He notes that lately he has had more difficulty with fatigue, generalized muscle pain and "brain fog."  He has submitted routine blood work to the [facility] to monitor his adrenal insufficiency and hypothyroidism and these are reportedly stable.  He feels like sometimes he feels "terrible" and has difficulty leaving the house due to pain and fatigue.  He has tried a wide variety of medications in the past to help with fibromyalgia without specific improvement.  These are summarized below.  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  Currently he takes tramadol 25-50 mg once or twice daily and this helps but does not entirely alleviate his pain.  Pain is worse in the morning, improves somewhat through the day.  Fatigue is always present.  He was recommended by his psychiatrist and neurologist at [facility] to try Remeron and donepezil.  He did not tolerate these.  He wonders about additional treatment options. Currently he feels like insomnia is better but fatigue and pain are worse. Despite his extensive workup with many specialists in the past at [facility] he wonders if there is still some occult cause of his symptoms which has not been identified.     Active Problems  Patient Active Problem List    Diagnosis Date Noted   . Nausea 03/13/2017     Priority: High     - eval in ED - nl  head CT, nl labs: Pt email 03/13/17: Thanks for checking.  Nausea has improved with Reglan. Could you please refill at [facility]?  I am only taking it when I need it, last dose yesterday at 3AM. I am aware of recommended limited use. Don't know what this was about but thankful that it has resolved. I stopped all supplements in the AM since the nausea hit about an hour after taking them. Also stopped Mirtazapine and Donepezil since I initially had trouble tolerating them. I don't believe either drug is benefitting me and would like to safely decrease number of meds. Would value your input. The insomnia is intractable. If you have any thoughts please let me know.   As I recall, a previous Creatinine was 1.5, so it is probably my baseline. Doubt it was dehydration as I was urinating very frequently and the urine appeared dilute. No urine was checked in the ER, but no symptoms of a UTI or pyelo without CVA tenderness, dy suria and nl WBC.  Will send the disc to [facility] for them to compare with previous CT done earlier this year. I am due to return to [facility] in about 9 months if everything remains stable.  The ER did a great job. They were efficient and equally important compassionate. Thanks again        .  History of meningioma of the brain 11/09/2015     Priority: High     12/09/02: s/p R parieto-occipital parasagittal craniotomy for 85-90% resection of R pareito-occipital falx meningioma that originally measured 61 x 24 mm    - 10/27/16: eval w [name] (Neurology - [facility]): agree w [name] that lesion is stable and that calcification should be reassuring . I recommended he remain under imaging surveillance. I have told him that it is very unlikely that there is a dysautonomia to account for his sx when upright, but I personally like to be sure that we have no evidence for that. He will be seen in Sleep Medicine and I endorse that as well.   - 07/04/16: eval w  [name] (Neurosurgery -[facility]): stable  both clinically and on imaging w regard to his now calcified residual parasagittal meningioma. ... Now that it is calcified, I am not convinced there is any change between Jan and Oct of this year. I think continued observation would be very reasonable. Recommend FU MRI in one year.   - eval w [name], Neurosurgery [facility]  - rec FU 1 year  - MRI Brain w/wo contrast 06/07/16: Again seen is a L posterior parafalcine meningioma. There is a slightly greater measurement in the cephalocaudal plane which may be related to technique or minimal tumor progression. Progressive opacification of the L mastoid air cells. Otherwise stable exam  - 10/13/16: letter from [name]: Neurosurgery at [facility]: "CT scan does show dents of calcification.Marland KitchenMarland KitchenAs we discussed in clinic I think it is unlikely that ongoing sx are related to meningioma .[name] I would continue to follow w MRI scan in one year.   - 10/07/15: eval w [name], Neurosurgery at [facility]: "number of nonspecific sx. I am not certain that they are related to any enlargement of his residual meningioma and certainly would not expect them to be improved by any treatment of his residual meningioma. With that in minds I think the question to establish is whether or not this residual is in fact enlarging. ... I suspect that this lesion is stable and in fact calcified. If that were the case, observation with another MRI scan in a year may be very reasonable. Alternatively we could consider either gamma knife radiosurgery or craniotomy and resection. The lesion is at the UL for size for what we could treat w gamma knife, but I think it could be accomplished in one or perhaps two staged procedures. However I would only recommend this if the lesion was clearly enlarging and I am not certain of this at the present time. Recommend CT scan of head to confirm that lesion is calcified. I will be in touch with him after this is available.   - 10/06/15: eval w Neurology [name] [facility]): I have told Dr. Lacretia Nicks that I really cannot give him any advise until I review outside pathology and prior films .[name] Pt requested appt in Fibromyalgia/Chronic fatigue clinic as well as Endo.   - 09/16/15 eval w [name], Neurosurgery [facility]: I do not believe any of the sx he is having now are related to the tumor. By report he did not have papilledema in [name] exam. I recommended however that he undergo CyberKnife radiosurgery. Pt will call my office to set up RadOnc consult and will proceed w CyberKnife tx.      . Chronic fatigue fibromyalgia syndrome 01/24/2013     Priority: High  In Aug 2006 hd noted spontaneous onset of severe fatigue and myalgias. He had extensive evaluation for CT and autoimmune disease. Ultimately he was discovered to have inadequate thyroid replacement as well as a low free testosterone. He was evaluated by endocrine at that time. ... Ultimately he was diagnosed with fibromyalgia and chronic fatigue syndrome for which he was prescribed various medications including provigil, cymbalta, gabapentin and pregabalin, and elavil (all w/o improvement).     - 10/08/15:  note from [name] [facility] Fibromyalgia and Chronic Fatigue): presentation is c/w fibromyalgia and chronic fatigue. Advised to complete Fibro/CFS treatment program  - 10/06/15 Note from Neurology Dr. Raynelle Fanning [facility]: In Aug 2006 noted spontaneous onset of severe fatigue and myalgias. He had extensive evaluation for CT and autoimmune disease. Ultimately he was discovered to have inadequate thyroid replacement as well as low free testosterone. He was eval by endocrine  ... Ultimately he was diagnosed with fibromyalgia and chronic fatigue syndrome for which he was prescribed various medications including provigil, cymbalta, gabapentin and pregabalin.      [name] History of insomnia 01/17/2017     Priority: Medium     - 10/31/16: eval w Endocrine Dr. Tawanna Cooler [facility]): I believe this is a major  contributor - we did have him do overnight oximetry with using CPAP and this did show continued desaturations that were very long periods of time when he was up and not able to fall asleep - he is seeing the sleep center      . Secondary adrenal insufficiency 01/17/2017     Priority: Medium     - 11/23/16: eval w Endocrine Dr. Tawanna Cooler [facility]): due for reassessment of FT4 and IGF 1 in 4 weeks after changing dose on 10/27/16. Mail in specimen. Pursue recs for insomnia from psych. MRI did not show signs of dementia. PFT nl. FU 6-12 mos  - 10/24/16: eval w Endocrine Dr. Tawanna Cooler [facility]): despite adequate replacement w levothyroxine, GH and hydrocortisone he still has significant fatigue, although there has been a definite benefit from the hormone treatment. Levothyroxine dose is a little high given the slightly low TSH and this is not uncommon to see replacement dose requirements decrease for thyroid replacement when [facility] is initiated. We are waiting on reports on IGF1 and testosterone.      . 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 James Ivanoff: "complicated situation. Recommend trying remeron 15 mg to help w sleep, mood and anxiety. ... If his sleep can be improved that could potentally improve his cognition. Try it for at least 6-8 weeks. He can let me know how he is doing via patient portal. I also recommended that he try to simplify his medication and situations, meaning not using klonopin or percocet. I cautioned him on marijuana use. Recommend continuing w cognitive behavioral therapy and psychotherapy techniques.      . Amnestic MCI (mild cognitive impairment with memory loss) 11/20/2016     Priority: Medium     - had dizziness on donepezil and remeron - [place] both - as of 01/18/17 - on half tab of donepezil 10 mg  - 11/24/16: eval w Neurology Dr. Betsey Holiday [facility]): He does have insomnia, and remeron just commenced which makes  good sense. He may also benefit from cholinergic stimulation and therefore donepezil may be helpful here. .. We discussed potential role of amyloid PET  testing. I will arrange an amyvid PET scan. If negative, early AD is effectively ruled out. Consider brain rehab program. Meet back after PET testing.   - 10/31/16: eval w Endocrine Dr. Eyvonne Mechanic [facility]): Dr. Earney Hamburg completed neuropsychologic testing and incidates he has very focal impairment in verbal memory and to a lesser extent in visual memory and indicates this is consistent with the amnestic variety of mild cognitive impairment. Neurology is arranging for further testing with MRI for cognitive dysfunction and brain PET scan.   - 10/28/16 eval w Dr Azucena Kuba [facility] Neuropsych): impaired performance in several measures of verbal learning and memory and some marginally poor performances in measures of nonverbal learning and memory. Marland KitchenMarland KitchenMarland KitchenThese results are significant and while neuropsychological testing in and of itself cannot diagnose any particular condition you would potentially benefit from further discussion, possibly with our colleagues in Neurology to determine if there is some underlying condition that may be contributing to these memory difficulties even beyond your history of brain tumor.      . Growth hormone deficiency 03/29/2016     Priority: Medium     - 10/31/16: eval w Endocrine Dr. Tawanna Cooler [facility]): increase GH to 0.4 mg daily  - 08/26/16:  eval w Dr. Eyvonne Mechanic [facility] Endocrine): At this point suspect that tsome of these issues will improve the longer he is on [facility] .Marland KitchenMarland Kitchen Therefore I am not concerned with him taking 7.5 mg to 10 mg of prednisone a day or its equivalent if that is what he needs in the next several months.   - 07/11/16: eval w Dr. Tawanna Cooler [facility] Endocrine): he did have very + response to hydrocortisone replacement  .[name] However he would lose effect after 4 hours. .. We have talked about options and could try prednisone with  longer half life - swtch to t mg in AM and 1 mg between 3-5 pm and adjust as needed. He has been on finasteride for many years and give trial off of this. New rx for androgel packets. Thyroid function is excellent.   - 12/10/15: eval w Dr. Tawanna Cooler [facility] Endocrine): no response to [facility] after glucagon stimulation. He does have some sx that could be explained by partial adrenal insufficiency ... I would like to proceed with a trial of glucocorticoid replacement now prior to starting [facility] to see if he has the typical response we would see w cortisol replacement. ... If his s do not improve w hydrocortisone in replacement doses this can be [place]. I think however we still would need to follow this given the unclear nature of the underlying pituitary process and the possibility that deficits may develop as time goes on.   - 10/08/15:  note from [name] [facility] Fibromyalgia and Chronic Fatigue): presentation is c/w fibromyalgia and chronic fatigue. Advised to complete Fibro/CFS treatment program  - 10/07/15: eval w Dr. Eyvonne Mechanic [facility] Endocrine): Await testosterone levels and advise on adjustments ... We do not need to worry about adrenal insufficiency. However he has multiple sx which are c/w growth hormone deficiency and I think this is worth defining. I will do glucagon stimulation test  - if this confirms GH deficiency I would strongly recommend replacement which hopefully will improve at least some of his sx particularly a component of his fatigue, his social isolation, his decreased ambition and altered body composition. I will see him after testing is complete.      . Essential hypertension 01/24/2013  Priority: Medium     - eval w Dr. Ova Freshwater (Nephor/HTN at [facility]): BP seems better, monitor home readings - not having significant autonomic dysfunction  -ECHO 10/27/16 [facility]: nl LV size and systolic function w EF of 65% - mild AV regurgitation and mild ascending aortic dilatation - recommend repeat  ECHO in 12 months  - 10/26/16: eval w Dr. Ova Freshwater (Nephro/HTN at [facility]): recommend change amlodipine to 10 mg from 5 mg and take at night. Continue ramipril 20 mg. I would hesitate to start clonidine given dry eyes and dry mouth. I would hesitate to put him on diuretic given the adrenal insufficiency. I would therefore lead toward putting him on something like low dose alpha blocker like carvedilol. ... He is agreeable to try this. We will follow up next month.   - 10/25/16: eval w Dr. Allen Norris (Cardiology): mild AV regurge based on outside echo, repeat echo. Borderline mid ascending aortic dilatation. Recommend ECHO in 12 months for FU. Lipid profile appears appropriate     . Mixed hyperlipidemia 01/24/2013     Priority: Medium     TC 189 TRI 112 HDL 81 LDL 86 09/14/16 -     Pt reports he has statin for many years (was taken off to see if this would help with fibromyalgia pain wo improvement) and zetia and tricor     . Primary hypothyroidism 01/24/2013     Priority: Medium     - TSH 0.436 as of 12/20/16 FT4 wnl  - 10/31/16: eval w Endocrine Dr. Tawanna Cooler [facility]): FT4 mildly elevated on 200 mcg day - decrease to 150 mcg daily and recheck FT4 in 8 weeks       . Hemianopia, homonymous, left 09/15/2015     Priority: Low     - evaluation w Ophthalmology Dr. Ferd Glassing /24/17:  this patient continues to do exceptionally well almost 13 years following resection of a right parietooccipital falcine meningioma. Prior to surgery, he had an inferior left homonymous defect that improved following surgery and has subsequently stabilized with minimal residual.Even though he has a moderate residual lesion abutting and occluding the posterior sagittal sinus, with slight compression of the medial aspect of the left occipital lobe, his examin today remains absolutely stable with no evidence of a worsening field defect and no evidence of papilledema or optic atrophy. He has a stable nevus in the right fundus. I will see him  again in 1-2 years. In meantime, he has asked for a suggestion of an endocrinologist who deals with pituitary hormone dysfunction and he also may be interested in seeing a radiation therapist for discussion of stereotactic radiosurgery using a gamma knife (as opposed to the Cyberknife). I have recommended that he discuss these issues with [name] when he sees him tomorrow.     . Secondary male hypogonadism 01/24/2013     Priority: Low     - - 10/31/16: eval w Endocrine Dr. Tawanna Cooler [facility]): testosterone level is excellent right now - continue current therapy         . History of herniated intervertebral disc 01/17/2017     2005: developed R hip and leg pain and was told he had "small central disk herniation" at L5-S1. Surgery was not recommended. He received PT and some epidural corticosteroid injections which improved but did not completely resolve the situation. Low back and leg pain recurred in 2006 and was again tx w steroid and PT.      . Mild  OSA  01/17/2017     - 10/31/16: eval w sleep medicine Dr. Ala Dach  [facility]): reviewed original records from sleep study in Sept 2017 and CPAP trial in Oct 2017 ... My opinion at this time his OSA is not sig enough to require a focused effort on using his CPAP consistently ... Particularly since he reports not having found CPAP to be helpful or restful, I think it is best to set it aside for the time being and at some point another sleep lab study and titration may be helpful. ... Depending on comorbidities this degree of OSA is worth an effort at treating but when there has not been any consistent benefit even in the past when he tried it consistently, then it is reasonable to put on hold for the time being.      . Chronic diarrhea 01/17/2017     - eval w ID 11/21/16 Dr. Marcy Panning [facility]): has had extensive evaluation. I cannot identify an infectious disease that is likely to cause symptomatology. As he does have some cognitive decline and chronic diarrhea  I will be checking a whipple PCR on blood although I think this is highly unlikely as he does not also have joint symptoms. A duodenal aspirate would be needed to confirm or completely rule out this diagnosis. I will also be checking for heavy metals. Given the amount of fish he consumes I am expecting to have high levels organic arsenic which are not toxic. . I will also be testing for stronglyloides which can be found in [place] and [place]. I will call if any of these tests are positive.      . Recurrent sinusitis 01/17/2017     - 11/21/16: eval w ENT Dr. Jannette Spanner [facility]): For recurrent sinusitis, he is on good medical regiment w daily nasal irrigations. Surgery would be indicated for 4-5 episodes of sinusitis per year. He was not interested in operative intervention and this is reasonable. In re to his deviated septum septoplasty could be considered. He was not interested at this time. To address his nasal obstruction on a mucosal basis I recommended triple spray. Avoid afrin. Offered him fluoroscopic eval of his dysphagia but he deferred. Would not recommend surgery for OSA.      [name] Panhypopituitarism 03/29/2016   . Chronic, continuous use of opioids 11/09/2015     - As of May 2018 pt reports he gets his pain medication and klonazepam from [facility]  - 07/01/16: note from [facility] Dr. Toni Amend: Pt informed I will no longer refill percocet, clonazepam, or provigil as there are no clear disease indications. Pt upset and feels he needs these meds for his CF/FMS. I advised pt that he will need to have either rheum or pain specialist provide if they feel they are indicated. Pt has shown no evidence of abuse, but also has been on meds for several years and has shown no benefit in my opinion. Pt is currently being treated for panhypopit and adrenal insuff, hypogonadism, and hypothyroidism as a result, as wellas, growth hormone deficiency. Further analysis suggests polypharmacy with potential drug interactions. The pt  knows how to wean clonazepam and has asked for 30days of tramadol to wean from the percocet. Note he is only on a half tablet of percocet qid. As a result will provide only 25mg  tabs to start qday and increase every third day to bid and tid max for 30 days total. No refills will be provided. Pt is aware.      [name]  History of asthma 01/24/2013     - since childhood    - 11/23/16: eval w Dr. Renita Papa (Pulm at [facility]): bronchial asthma mild, allergic rhinitis postnasal drainage: uses albuterol BID for chest tightness. Check nitric oxide test. He has had intermittent eosinophilia and this points towards an eosinophilic asthma phenotype. He does have allergic rhinitis and think he would be better servied with our triple combination nose spray which includes mometasone, diphenhydramine and iptratroprium. I gave him rx for this.      [name] BPH (benign prostatic hyperplasia) 01/24/2013       The patient's past medical history, surgical history, medications and allergies were reviewed.    Allergies   Allergen Reactions   . Bextra [Valdecoxib] Rash       Medications  Current Outpatient Prescriptions   Medication Sig Dispense Refill   . amLODIPine (NORVASC) 10 MG tablet Take 10 mg by mouth daily.     . ANDROGEL 20.25 MG/1.25GM (1.62%) Gel   1   . aspirin 81 MG EC tablet Take 81 mg by mouth daily.       . Calcium Carbonate-Vitamin D (CALCIUM + D PO) Take by mouth. 500/400mg   daily      . carvedilol (COREG) 6.25 MG tablet Take 12.5 mg by mouth 2 (two) times daily with meals.         . Coenzyme Q10 100 MG capsule Take 100 mg by mouth daily.     . cycloSPORINE (RESTASIS) 0.05 % ophthalmic emulsion Place 1 drop into both eyes every 12 (twelve) hours. 3 each 4   . ezetimibe (ZETIA) 10 MG tablet TAKE ONE TABLET BY MOUTH ONE TIME DAILY  90 tablet 3   . fenofibrate (TRICOR) 145 MG tablet Take 1 tablet (145 mg total) by mouth daily. 90 tablet 3   . FLOVENT HFA 110 MCG/ACT inhaler Inhale 2 puffs into the lungs 2 (two) times daily.         .  hydrocortisone (CORTEF) 10 MG tablet Take 1 & 1/2 tablet every morning,1 tablet at noon and one half tablet between 3 and 5pm daily     . levothyroxine (SYNTHROID, LEVOTHROID) 150 MCG tablet Take 1 tablet (150 mcg total) by mouth Once a day at 6:00am. 90 tablet 3   . montelukast (SINGULAIR) 10 MG tablet Take 1 tablet (10 mg total) by mouth nightly. 90 tablet 3   . Multiple Vitamins-Minerals (CENTRUM SILVER PO) Take 1 tablet by mouth daily.       . NUTROPIN AQ NUSPIN 10 10 MG/2ML Solution 0.4 mg SQ daily - per [facility] (Patient taking differently: Inject 0.3 mg into the skin daily.0.4 mg SQ daily - per [facility]    )     . pyridoxine (B-6) 100 MG tablet Take 100 mg by mouth daily.     . ramipril (ALTACE) 10 MG capsule Take 2 capsules (20 mg total) by mouth daily. (Patient taking differently: Take 20 mg by mouth 2 (two) times daily.    ) 180 capsule 3   . simvastatin (ZOCOR) 20 MG tablet TAKE ONE TABLET BY MOUTH NIGHTLY 90 tablet 3   . testosterone (ANDROGEL) 25 MG/2.5GM (1%) Gel 2.5 mg daily.      1   . traMADol (ULTRAM) 50 MG tablet 1/2 tab daily - per [facility] (Patient taking differently: Take 50 mg by mouth 2 (two) times daily.1/2 tab daily - per [facility]    ) 20 tablet 0   . vitamin B-12 (CYANOCOBALAMIN) 1000 MCG  tablet Take 1 tablet (1,000 mcg total) by mouth daily. 30 tablet 11   . VITAMIN D, CHOLECALCIFEROL, PO Take 5,000 Units by mouth daily.        . clonazePAM (KLONOPIN) 0.5 MG tablet Take 0.25 mg by mouth daily as needed.      2   . nystatin (MYCOSTATIN) 100000 UNIT/ML suspension SWISH IN MOUTH AND RETAIN FOR AS LONG AS POSSIBLE (SEVERAL MINUTES) BEFORE SWALLOWING 4 TIMES DAILY 473 mL 3   . promethazine (PHENERGAN) 25 MG tablet Take 1 tablet (25 mg total) by mouth every 6 (six) hours as needed for Nausea.for up to 12 doses 12 tablet 0     No current facility-administered medications for this visit.      Physical Exam  BP 128/72 (BP Site: Left arm, Patient Position: Sitting, Cuff Size: Medium)   Pulse (!) 59   Temp  97.9 F (36.6 C) (Oral)   Resp 16   Wt 84.4 kg (186 lb 1.6 oz)   SpO2 94%   BMI 26.70 kg/m   Wt Readings from Last 3 Encounters:   06/22/17 84.4 kg (186 lb 1.6 oz)   03/11/17 82.1 kg (181 lb)   01/03/17 86.6 kg (191 lb)     GEN: Well developed well nourished male alert and appropriate in no apparent distress    Assessment/Plan    1. Chronic fatigue fibromyalgia syndrome  45 minutes spent with patient today discussing his history and symptoms. Reviewed and discussued his prior treatment and he has tried a variety of medications to include gabapentin, cymbalta, elavil, lyrica, flexeril among others without improvement. He wonders about going back to percocet as he felt when he took this he was more functional. Currently he gets tramadol from [facility] and this helps but not entirely. I counseled him on dosing of tramadol and he could increase/titrate his dose of this slightly (50 mg Q6 HRS PRN). I gave him the name of a pain specialist to discuss further treatment options. Urged him to try to walk daily or do some form of exercise to help with his mood and pain.     - Ambulatory referral to Pain Clinic    2. Mixed hyperlipidemia  He has polpharmacy and recommend he continue his statin and zetia but query if the tricor is providing true additional cardiovascular risk reduction on top of the others. Recommend he stop this and observe for effect in lipid panel in 2-3 months. Also raised question of his use of the CoQ10 and Singulair and if these may also be considered for reduction in dose or discontinuation one at a time if no adverse effects are noted from discontinuation.    Orders Placed This Encounter   Procedures   . Ambulatory referral to Pain Clinic     Gloriajean Dell, MD  [facility]  [address]  [place], [place] [address]  P) [phone]  F) [phone]  [email or web address]

## 2017-06-30 ENCOUNTER — Encounter (INDEPENDENT_AMBULATORY_CARE_PROVIDER_SITE_OTHER): Payer: Self-pay

## 2017-07-10 ENCOUNTER — Encounter: Payer: Self-pay | Admitting: Internal Medicine

## 2017-08-09 ENCOUNTER — Ambulatory Visit (INDEPENDENT_AMBULATORY_CARE_PROVIDER_SITE_OTHER): Payer: Medicare Other | Admitting: Internal Medicine

## 2017-08-09 ENCOUNTER — Encounter: Payer: Self-pay | Admitting: Internal Medicine

## 2017-08-09 VITALS — BP 137/73 | HR 50 | Temp 98.3°F | Resp 16 | Wt 192.4 lb

## 2017-08-09 DIAGNOSIS — E039 Hypothyroidism, unspecified: Secondary | ICD-10-CM

## 2017-08-09 DIAGNOSIS — M797 Fibromyalgia: Secondary | ICD-10-CM

## 2017-08-09 DIAGNOSIS — M47812 Spondylosis without myelopathy or radiculopathy, cervical region: Secondary | ICD-10-CM

## 2017-08-09 DIAGNOSIS — M79672 Pain in left foot: Secondary | ICD-10-CM

## 2017-08-09 DIAGNOSIS — R5382 Chronic fatigue, unspecified: Secondary | ICD-10-CM

## 2017-08-09 NOTE — Patient Instructions (Signed)
Please schedule an appointment with     [facility] (multiple locations in [place])  [address]  [place], [place] [address]  [phone]  Fax [phone]  [email or web address]    [facility]):  [address]  [place], [place]  [address]  [phone]  [email or web address]    OR    Merlyn Albert , MD, MPH  Annye Asa, MD MScPH  [facility]  [address]  [place], [place] [address]  [phone]  Fax [phone]    [address] [place] [address]  [place] [address]  [phone]  Fax [phone]    OR

## 2017-08-09 NOTE — Progress Notes (Signed)
Chief Complaint   Patient presents with   . Left foot discoloration and pain       HPI    Jamin A. 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 Toni Amend here for evaluation and treatment of the following concerns. Patient is today here for a same-day visit for some left-sided foot pain localized to his 1st metatarsal.  He notes he was in his usual state of health until he was wearing some new boots and following this noted some onset of pain in his left 1st metatarsal.  He also has noted some mild swelling and redness and apparent bruising in this area.  He discontinued wearing the new boots.  His symptoms have improved in the last 24 hours.  He notes the erythema has improved.  There is some mild swelling in apparent bruising which remains, but this is improved.  His pain is improved.     Patient has a chronic history of chronic fatigue and fibromyalgia syndrome and has had extensive evaluation in the past at [facility] and at [facility].  He has been not responsive to a multitude of medications in the past to help with his pain and mood and function.  He notes that he continues to get "attacks" of fatigue for 5 times per year where he is entirely debilitated.  Most recently he had a weeklong attack several weeks ago which also resolved.  He is back to his baseline, but continues to feel poorly.  He plans to follow up with the [facility] in January or February 2019.  He feels frustrated by his lack of other definitive diagnosis.  He continues to have some sensation of coldness and temperature change in his feet.  He is not a smoker.  He has no history of gout.  [facility]  Dr. Penni Bombard who performed an MRI of his cervical spine noting some spondylosis.  He has another follow-up with Dr. Penni Bombard in the weeks to come, but the patient does not feel this may be effective as he is recommending specific interventions for pain and not widespread pain  treatment.  Currently he is using tramadol, which is helpful but not entirely.  He has tried other medications in the past to include gabapentin, Lyrica, Elavil, Cymbalta, and others without apparent benefit.  He does use marijuana in the form of coconut oil for the past year and this also has some benefits in reducing his pain and improving function.     Active Problems  Patient Active Problem List    Diagnosis Date Noted   . History of meningioma of the brain 11/09/2015     Priority: High     12/09/02: s/p R parieto-occipital parasagittal craniotomy for 85-90% resection of R pareito-occipital falx meningioma that originally measured 61 x 24 mm    - 10/27/16: eval w [name] (Neurology - [facility]): agree w [name] that lesion is stable and that calcification should be reassuring . I recommended he remain under imaging surveillance. I have told him that it is very unlikely that there is a dysautonomia to account for his sx when upright, but I personally like to be sure that we have no evidence for that. He will be seen in Sleep Medicine and I endorse that as well.   - 07/04/16: eval w  [name] (Neurosurgery -[facility]): stable both clinically and on imaging w regard to his now calcified  residual parasagittal meningioma. ... Now that it is calcified, I am not convinced there is any change between Jan and Oct of this year. I think continued observation would be very reasonable. Recommend FU MRI in one year.   - eval w [name], Neurosurgery [facility]  - rec FU 1 year  - MRI Brain w/wo contrast 06/07/16: Again seen is a L posterior parafalcine meningioma. There is a slightly greater measurement in the cephalocaudal plane which may be related to technique or minimal tumor progression. Progressive opacification of the L mastoid air cells. Otherwise stable exam  - 10/13/16: letter from [name]: Neurosurgery at [facility]: "CT scan does show dents of calcification.Marland KitchenMarland KitchenAs we discussed in clinic I think it is unlikely  that ongoing sx are related to meningioma .[name] I would continue to follow w MRI scan in one year.   - 10/07/15: eval w [name], Neurosurgery at [facility]: "number of nonspecific sx. I am not certain that they are related to any enlargement of his residual meningioma and certainly would not expect them to be improved by any treatment of his residual meningioma. With that in minds I think the question to establish is whether or not this residual is in fact enlarging. ... I suspect that this lesion is stable and in fact calcified. If that were the case, observation with another MRI scan in a year may be very reasonable. Alternatively we could consider either gamma knife radiosurgery or craniotomy and resection. The lesion is at the UL for size for what we could treat w gamma knife, but I think it could be accomplished in one or perhaps two staged procedures. However I would only recommend this if the lesion was clearly enlarging and I am not certain of this at the present time. Recommend CT scan of head to confirm that lesion is calcified. I will be in touch with him after this is available.   - 10/06/15: eval w Neurology [name] [facility]): I have told Dr. Lacretia Nicks that I really cannot give him any advise until I review outside pathology and prior films .[name] Pt requested appt in Fibromyalgia/Chronic fatigue clinic as well as Endo.   - 09/16/15 eval w [name], Neurosurgery [facility]: I do not believe any of the sx he is having now are related to the tumor. By report he did not have papilledema in [name] exam. I recommended however that he undergo CyberKnife radiosurgery. Pt will call my office to set up RadOnc consult and will proceed w CyberKnife tx.      . Chronic fatigue fibromyalgia syndrome 01/24/2013     Priority: High     In Aug 2006 hd noted spontaneous onset of severe fatigue and myalgias. He had extensive evaluation for CT and autoimmune disease. Ultimately he was discovered to have inadequate thyroid  replacement as well as a low free testosterone. He was evaluated by endocrine at that time. ... Ultimately he was diagnosed with fibromyalgia and chronic fatigue syndrome for which he was prescribed various medications including provigil, cymbalta, gabapentin and pregabalin, and elavil (all w/o improvement). \    - Pt email 07/11/17: I have an appointment to see Dr. Penni Bombard on Wednesday, November 21st @1 :30.  - 06/22/17: Today the patient is mainly here to talk about his chronic pain.  He carries a diagnosis from previous providers of chronic pain and fibromyalgia syndrome.  He has been extensively evaluated in the past for this and other conditions by the [facility].  He has another annual examination with [facility] coming up in the next 5 months.  He notes that lately he has had more difficulty with fatigue, generalized muscle pain and "brain fog."  He has submitted routine blood work to the [facility] to monitor his adrenal insufficiency and hypothyroidism and these are reportedly stable.  He feels like sometimes he feels "terrible" and has difficulty leaving the house due to pain and fatigue.  He has tried a wide variety of medications in the past to help with fibromyalgia without specific improvement.  These are summarized below.  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  Currently he takes tramadol 25-50 mg once or twice daily and this helps but does not entirely alleviate his pain.  Pain is worse in the morning, improves somewhat through the day.  Fatigue is always present.  He was recommended by his psychiatrist and neurologist at [facility] to try Remeron and donepezil.  He did not tolerate these.  He wonders about additional treatment options. Currently he feels like insomnia is better but fatigue and pain are worse. Despite his extensive workup with many specialists in the past at [facility] he  wonders if there is still some occult cause of his symptoms which has not been identified.  - 10/08/15:  note from [name] [facility] Fibromyalgia and Chronic Fatigue): presentation is c/w fibromyalgia and chronic fatigue. Advised to complete Fibro/CFS treatment program  - 10/06/15 Note from Neurology Dr. Raynelle Fanning Hammack: In Aug 2006 noted spontaneous onset of severe fatigue and myalgias. He had extensive evaluation for CT and autoimmune disease. Ultimately he was discovered to have inadequate thyroid replacement as well as low free testosterone. He was eval by endocrine  ... Ultimately he was diagnosed with fibromyalgia and chronic fatigue syndrome for which he was prescribed various medications including provigil, cymbalta, gabapentin and pregabalin.      [name] History of insomnia 01/17/2017     Priority: Medium     - 10/31/16: eval w Endocrine Dr. Tawanna Cooler [facility]): I believe this is a major contributor - we did have him do overnight oximetry with using CPAP and this did show continued desaturations that were very long periods of time when he was up and not able to fall asleep - he is seeing the sleep center      . Secondary adrenal insufficiency 01/17/2017     Priority: Medium     - 11/23/16: eval w Endocrine Dr. Tawanna Cooler [place] [facility]): due for reassessment of FT4 and IGF 1 in 4 weeks after changing dose on 10/27/16. Mail in specimen. Pursue recs for insomnia from psych. MRI did not show signs of dementia. PFT nl. FU 6-12 mos  - 10/24/16: eval w Endocrine Dr. Tawanna Cooler [facility]): despite adequate replacement w levothyroxine, GH and hydrocortisone he still has significant fatigue, although there has been a definite benefit from the hormone treatment. Levothyroxine dose is a little high given the slightly low TSH and this is not uncommon to see replacement dose requirements decrease for thyroid replacement when [facility] is initiated. We are waiting on reports on IGF1 and testosterone.      . Mood disorder secondary to  multiple medical problems 01/17/2017     Priority: Medium     - 12/01/16 eval w psychiatry at [facility] Dr. James Ivanoff: "complicated situation. Recommend trying remeron 15 mg to  help w sleep, mood and anxiety. ... If his sleep can be improved that could potentally improve his cognition. Try it for at least 6-8 weeks. He can let me know how he is doing via patient portal. I also recommended that he try to simplify his medication and situations, meaning not using klonopin or percocet. I cautioned him on marijuana use. Recommend continuing w cognitive behavioral therapy and psychotherapy techniques.      . Amnestic MCI (mild cognitive impairment with memory loss) 11/20/2016     Priority: Medium     - had dizziness on donepezil and remeron - Goessel'd both - as of 01/18/17 - on half tab of donepezil 10 mg  - 11/24/16: eval w Neurology Dr. Betsey Holiday [facility]): He does have insomnia, and remeron just commenced which makes good sense. He may also benefit from cholinergic stimulation and therefore donepezil may be helpful here. .. We discussed potential role of amyloid PET testing. I will arrange an amyvid PET scan. If negative, early AD is effectively ruled out. Consider brain rehab program. Meet back after PET testing.   - 10/31/16: eval w Endocrine Dr. Eyvonne Mechanic [facility]): Dr. Earney Hamburg completed neuropsychologic testing and incidates he has very focal impairment in verbal memory and to a lesser extent in visual memory and indicates this is consistent with the amnestic variety of mild cognitive impairment. Neurology is arranging for further testing with MRI for cognitive dysfunction and brain PET scan.   - 10/28/16 eval w Dr Azucena Kuba [facility]): impaired performance in several measures of verbal learning and memory and some marginally poor performances in measures of nonverbal learning and memory. Marland KitchenMarland KitchenMarland KitchenThese results are significant and while neuropsychological testing in and of itself cannot diagnose any particular  condition you would potentially benefit from further discussion, possibly with our colleagues in Neurology to determine if there is some underlying condition that may be contributing to these memory difficulties even beyond your history of brain tumor.      . Growth hormone deficiency 03/29/2016     Priority: Medium     - 10/31/16: eval w Endocrine Dr. Tawanna Cooler [facility]): increase GH to 0.4 mg daily  - 08/26/16:  eval w Dr. Eyvonne Mechanic [facility] Endocrine): At this point suspect that tsome of these issues will improve the longer he is on [facility] .Marland KitchenMarland Kitchen Therefore I am not concerned with him taking 7.5 mg to 10 mg of prednisone a day or its equivalent if that is what he needs in the next several months.   - 07/11/16: eval w Dr. Tawanna Cooler [place] [facility] Endocrine): he did have very + response to hydrocortisone replacement  .[name] However he would lose effect after 4 hours. .. We have talked about options and could try prednisone with longer half life - swtch to t mg in AM and 1 mg between 3-5 pm and adjust as needed. He has been on finasteride for many years and give trial off of this. New rx for androgel packets. Thyroid function is excellent.   - 12/10/15: eval w Dr. Tawanna Cooler [place] [facility] Endocrine): no response to [facility] after glucagon stimulation. He does have some sx that could be explained by partial adrenal insufficiency ... I would like to proceed with a trial of glucocorticoid replacement now prior to starting [facility] to see if he has the typical response we would see w cortisol replacement. ... If his s do not improve w hydrocortisone in replacement doses this can be [place]. I think however we still would need  to follow this given the unclear nature of the underlying pituitary process and the possibility that deficits may develop as time goes on.   - 10/08/15:  note from [name] [facility] Fibromyalgia and Chronic Fatigue): presentation is c/w fibromyalgia and chronic fatigue. Advised to complete Fibro/CFS treatment program  -  10/07/15: eval w Dr. Eyvonne Mechanic [facility] Endocrine): Await testosterone levels and advise on adjustments ... We do not need to worry about adrenal insufficiency. However he has multiple sx which are c/w growth hormone deficiency and I think this is worth defining. I will do glucagon stimulation test  - if this confirms GH deficiency I would strongly recommend replacement which hopefully will improve at least some of his sx particularly a component of his fatigue, his social isolation, his decreased ambition and altered body composition. I will see him after testing is complete.      . Essential hypertension 01/24/2013     Priority: Medium     - eval w Dr. Ova Freshwater (Nephor/HTN at [facility]): BP seems better, monitor home readings - not having significant autonomic dysfunction  -ECHO 10/27/16 [facility]: nl LV size and systolic function w EF of 65% - mild AV regurgitation and mild ascending aortic dilatation - recommend repeat ECHO in 12 months  - 10/26/16: eval w Dr. Ova Freshwater (Nephro/HTN at [facility]): recommend change amlodipine to 10 mg from 5 mg and take at night. Continue ramipril 20 mg. I would hesitate to start clonidine given dry eyes and dry mouth. I would hesitate to put him on diuretic given the adrenal insufficiency. I would therefore lead toward putting him on something like low dose alpha blocker like carvedilol. ... He is agreeable to try this. We will follow up next month.   - 10/25/16: eval w Dr. Allen Norris (Cardiology): mild AV regurge based on outside echo, repeat echo. Borderline mid ascending aortic dilatation. Recommend ECHO in 12 months for FU. Lipid profile appears appropriate     . Mixed hyperlipidemia 01/24/2013     Priority: Medium     TC 189 TRI 112 HDL 81 LDL 86 09/14/16 -     Pt reports he has statin for many years (was taken off to see if this would help with fibromyalgia pain wo improvement) and zetia and tricor     . Primary hypothyroidism 01/24/2013     Priority: Medium     - reports TSH in Oct  2018 from [facility] reportedly normal  - TSH 0.436 as of 12/20/16 FT4 wnl  - 10/31/16: eval w Endocrine Dr. Tawanna Cooler [place] [facility]): FT4 mildly elevated on 200 mcg day - decrease to 150 mcg daily and recheck FT4 in 8 weeks       . Hemianopia, homonymous, left 09/15/2015     Priority: Low     - evaluation w Ophthalmology Dr. Ferd Glassing /24/17:  this patient continues to do exceptionally well almost 13 years following resection of a right parietooccipital falcine meningioma. Prior to surgery, he had an inferior left homonymous defect that improved following surgery and has subsequently stabilized with minimal residual.Even though he has a moderate residual lesion abutting and occluding the posterior sagittal sinus, with slight compression of the medial aspect of the left occipital lobe, his examin today remains absolutely stable with no evidence of a worsening field defect and no evidence of papilledema or optic atrophy. He has a stable nevus in the right fundus. I will see him again in 1-2 years. In meantime, he has asked  for a suggestion of an endocrinologist who deals with pituitary hormone dysfunction and he also may be interested in seeing a radiation therapist for discussion of stereotactic radiosurgery using a gamma knife (as opposed to the Cyberknife). I have recommended that he discuss these issues with [name] when he sees him tomorrow.     . Secondary male hypogonadism 01/24/2013     Priority: Low     - - 10/31/16: eval w Endocrine Dr. Tawanna Cooler [place] [facility]): testosterone level is excellent right now - continue current therapy         . Spondylosis of cervical region without myelopathy or radiculopathy 08/09/2017     - MRI C Spine w/o contrast: C2-C3: Small posterior disc bulge partially effacing the ventral CSF and gently indenting the ventral cervical spinal cord.  There is no underlying cord signal abnormality.  There is no central canal nor is there foraminal narrowing.  C3-C4: There is a disc osteophyte  complex with a superimposed posterior central disc protrusion which partially effaces the ventral CSF and indents the ventral cervical spinal cord.  There is no central canal stenosis or foraminal narrowing.  C4-C5: There is a disc osteophyte complex with prominent posterior lateral disc component within the foramina bilaterally, right greater than left.  Posterior disc bulge partially effaces the ventral CSF and indents the right ventral cervical spinal cord.  There is mild central canal stenosis.  There is no foraminal narrowing.  C5-C6: There is disc space narrowing and disc desiccation.  There is a disc osteophyte complex with prominent uncovertebral hypertrophy, right greater than left.  Posterior disc bulge partially effaces the ventral CSF.  There is no central canal stenosis.  There is mild to moderate right-sided foraminal encroachment.  The left foramen is patent.  C6-C7: There is disc space narrowing and disc desiccation.  There is a disc osteophyte complex with right posterior lateral disc herniation partially effacing the ventral CSF and mildly flattening.  The underlying cervical spinal cord.  There is marked right-sided foraminal encroachment as well as mild left-sided foraminal narrowing.  There is facet arthropathy.  C7-T1: There is no evidence of central canal or foraminal stenosis.  Overall impression: Multilevel cervical spondylosis as described in detail above.  - [facility] Dr. Penni Bombard Nov 2018     . History of herniated intervertebral disc 01/17/2017     2005: developed R hip and leg pain and was told he had "small central disk herniation" at L5-S1. Surgery was not recommended. He received PT and some epidural corticosteroid injections which improved but did not completely resolve the situation. Low back and leg pain recurred in 2006 and was again tx w steroid and PT.      . Mild OSA  01/17/2017     - 10/31/16: eval w sleep medicine Dr. Ala Dach  [facility]): reviewed  original records from sleep study in Sept 2017 and CPAP trial in Oct 2017 ... My opinion at this time his OSA is not sig enough to require a focused effort on using his CPAP consistently ... Particularly since he reports not having found CPAP to be helpful or restful, I think it is best to set it aside for the time being and at some point another sleep lab study and titration may be helpful. ... Depending on comorbidities this degree of OSA is worth an effort at treating but when there has not been any consistent benefit even in the past when he tried it consistently,  then it is reasonable to put on hold for the time being.      . Chronic diarrhea 01/17/2017     - eval w ID 11/21/16 Dr. Marcy Panning [facility]): has had extensive evaluation. I cannot identify an infectious disease that is likely to cause symptomatology. As he does have some cognitive decline and chronic diarrhea I will be checking a whipple PCR on blood although I think this is highly unlikely as he does not also have joint symptoms. A duodenal aspirate would be needed to confirm or completely rule out this diagnosis. I will also be checking for heavy metals. Given the amount of fish he consumes I am expecting to have high levels organic arsenic which are not toxic. . I will also be testing for stronglyloides which can be found in [place] and [place]. I will call if any of these tests are positive.      . Recurrent sinusitis 01/17/2017     - 11/21/16: eval w ENT Dr. Jannette Spanner [facility]): For recurrent sinusitis, he is on good medical regiment w daily nasal irrigations. Surgery would be indicated for 4-5 episodes of sinusitis per year. He was not interested in operative intervention and this is reasonable. In re to his deviated septum septoplasty could be considered. He was not interested at this time. To address his nasal obstruction on a mucosal basis I recommended triple spray. Avoid afrin. Offered him fluoroscopic eval of his dysphagia but he deferred.  Would not recommend surgery for OSA.      [name] Panhypopituitarism 03/29/2016   . Chronic, continuous use of opioids 11/09/2015     - As of May 2018 pt reports he gets his pain medication and klonazepam from [facility]  - 07/01/16: note from [facility] Dr. Toni Amend: Pt informed I will no longer refill percocet, clonazepam, or provigil as there are no clear disease indications. Pt upset and feels he needs these meds for his CF/FMS. I advised pt that he will need to have either rheum or pain specialist provide if they feel they are indicated. Pt has shown no evidence of abuse, but also has been on meds for several years and has shown no benefit in my opinion. Pt is currently being treated for panhypopit and adrenal insuff, hypogonadism, and hypothyroidism as a result, as wellas, growth hormone deficiency. Further analysis suggests polypharmacy with potential drug interactions. The pt knows how to wean clonazepam and has asked for 30days of tramadol to wean from the percocet. Note he is only on a half tablet of percocet qid. As a result will provide only 25mg  tabs to start qday and increase every third day to bid and tid max for 30 days total. No refills will be provided. Pt is aware.      [name] History of asthma 01/24/2013     - since childhood    - 11/23/16: eval w Dr. Renita Papa (Pulm at [facility]): bronchial asthma mild, allergic rhinitis postnasal drainage: uses albuterol BID for chest tightness. Check nitric oxide test. He has had intermittent eosinophilia and this points towards an eosinophilic asthma phenotype. He does have allergic rhinitis and think he would be better servied with our triple combination nose spray which includes mometasone, diphenhydramine and iptratroprium. I gave him rx for this.      [name] BPH (benign prostatic hyperplasia) 01/24/2013       The patient's past medical history, surgical history, medications and allergies were reviewed.    Allergies   Allergen Reactions   .  Bextra [Valdecoxib] Rash        Medications  Current Outpatient Prescriptions   Medication Sig Dispense Refill   . amLODIPine (NORVASC) 10 MG tablet Take 10 mg by mouth daily.     . ANDROGEL 20.25 MG/1.25GM (1.62%) Gel   1   . aspirin 81 MG EC tablet Take 81 mg by mouth daily.       . Calcium Carbonate-Vitamin D (CALCIUM + D PO) Take by mouth. 500/400mg   daily      . carvedilol (COREG) 6.25 MG tablet Take 12.5 mg by mouth 2 (two) times daily with meals.         . clonazePAM (KLONOPIN) 0.5 MG tablet Take 0.25 mg by mouth daily as needed.      2   . Coenzyme Q10 100 MG capsule Take 100 mg by mouth daily.     . cycloSPORINE (RESTASIS) 0.05 % ophthalmic emulsion Place 1 drop into both eyes every 12 (twelve) hours. 3 each 4   . ezetimibe (ZETIA) 10 MG tablet TAKE ONE TABLET BY MOUTH ONE TIME DAILY  90 tablet 3   . FLOVENT HFA 110 MCG/ACT inhaler Inhale 2 puffs into the lungs 2 (two) times daily.         . hydrocortisone (CORTEF) 10 MG tablet Take 1 & 1/2 tablet every morning,1 tablet at noon and one half tablet between 3 and 5pm daily     . levothyroxine (SYNTHROID, LEVOTHROID) 150 MCG tablet Take 1 tablet (150 mcg total) by mouth Once a day at 6:00am. 90 tablet 3   . montelukast (SINGULAIR) 10 MG tablet Take 1 tablet (10 mg total) by mouth nightly. 90 tablet 3   . Multiple Vitamins-Minerals (CENTRUM SILVER PO) Take 1 tablet by mouth daily.       . NUTROPIN AQ NUSPIN 10 10 MG/2ML Solution 0.4 mg SQ daily - per [facility] (Patient taking differently: Inject 0.3 mg into the skin daily.0.4 mg SQ daily - per [facility]    )     . nystatin (MYCOSTATIN) 100000 UNIT/ML suspension SWISH IN MOUTH AND RETAIN FOR AS LONG AS POSSIBLE (SEVERAL MINUTES) BEFORE SWALLOWING 4 TIMES DAILY 473 mL 3   . promethazine (PHENERGAN) 25 MG tablet Take 1 tablet (25 mg total) by mouth every 6 (six) hours as needed for Nausea.for up to 12 doses 12 tablet 0   . pyridoxine (B-6) 100 MG tablet Take 100 mg by mouth daily.     . ramipril (ALTACE) 10 MG capsule Take 2 capsules (20 mg  total) by mouth daily. (Patient taking differently: Take 20 mg by mouth 2 (two) times daily.    ) 180 capsule 3   . simvastatin (ZOCOR) 20 MG tablet TAKE ONE TABLET BY MOUTH NIGHTLY 90 tablet 3   . testosterone (ANDROGEL) 25 MG/2.5GM (1%) Gel 2.5 mg daily.      1   . Testosterone 20.25 MG/ACT (1.62%) Gel Place onto the skin.     [name] traMADol (ULTRAM) 50 MG tablet 1/2 tab daily - per [facility] (Patient taking differently: Take 50 mg by mouth 2 (two) times daily.1/2 tab daily - per [facility]    ) 20 tablet 0   . vitamin B-12 (CYANOCOBALAMIN) 1000 MCG tablet Take 1 tablet (1,000 mcg total) by mouth daily. 30 tablet 11   . VITAMIN D, CHOLECALCIFEROL, PO Take 5,000 Units by mouth daily.          No current facility-administered medications for this visit.  Review of Systems  CONST: 6 lb WG since last visit, no fevers/chills sweats, + continued fatigue, + muscle aches  MSK:  No other new joint or muscle pain, swelling or weakness    Physical Exam  BP 137/73 (BP Site: Left arm, Patient Position: Sitting, Cuff Size: Medium)   Pulse (!) 50   Temp 98.3 F (36.8 C) (Oral)   Resp 16   Wt 87.3 kg (192 lb 6.4 oz)   SpO2 96%   BMI 27.61 kg/m   Wt Readings from Last 3 Encounters:   08/09/17 87.3 kg (192 lb 6.4 oz)   06/22/17 84.4 kg (186 lb 1.6 oz)   03/11/17 82.1 kg (181 lb)     GEN: Well developed well nourished male alert and appropriate in no apparent distress  L FOOT: Mild discoloration of left 1st metatarsal joint appearing hematoma-like.  No tenderness.  No evident swelling, no tenderness of the malleoli.  No other tenderness of the tarsal bones.  No pain with the 1st metatarsal joint with axial load.    Assessment/Plan    1. Left foot pain  Suspect this was related to the new boots - recommend more comfortable foot wear and observe. Pt reports sx are improving on own in past 24 hours.     2. Primary hypothyroidism  TSH recently checked by [facility] - obtain records    3. Chronic fatigue fibromyalgia syndrome  As above -  recommend he FU with [facility] in early 2019 to review his case and consider other treatment options if available. Continue current medications.     4. Spondylosis of cervical region without myelopathy or radiculopathy  Pt saw Dr. Penni Bombard and somewhat frustrated by this visit as there is no specific procedure or intervention for him. He has a follow up to discuss MRI results.     Gloriajean Dell, MD  [facility]  [address]  [place], [place] [address]  P) [phone]  F) [phone]  [email or web address]

## 2017-08-23 ENCOUNTER — Telehealth: Payer: Self-pay | Admitting: Internal Medicine

## 2017-08-23 DIAGNOSIS — R42 Dizziness and giddiness: Secondary | ICD-10-CM

## 2017-08-23 DIAGNOSIS — Z86011 Personal history of benign neoplasm of the brain: Secondary | ICD-10-CM

## 2017-08-23 NOTE — Telephone Encounter (Signed)
Patient called and has an appointment with Dr. Dulcy Fanny (neurologist) for Tuesday, January 8th. He needs to have a referral done.  He thinks he has dysautonomia.  If you could please do this and I can fax it over to his office.

## 2017-08-23 NOTE — Telephone Encounter (Signed)
Referral placed.

## 2017-08-24 NOTE — Telephone Encounter (Signed)
Faxed over referral to Dr. Collier Flowers office.

## 2017-08-25 ENCOUNTER — Telehealth: Payer: Self-pay | Admitting: Internal Medicine

## 2017-08-25 MED ORDER — ZOLPIDEM TARTRATE 5 MG PO TABS
5.0000 mg | ORAL_TABLET | Freq: Every evening | ORAL | 0 refills | Status: DC | PRN
Start: 2017-08-25 — End: 2018-02-13

## 2017-08-25 NOTE — Telephone Encounter (Signed)
Pt with possible adrenal insuff while under stress. Trial of hydrocortisone to 100mg  tid for 24-48 hours per [facility] endo clinic. If works to decrease nausea and weakness then pt will double his daily dosing regimen. Pt wants to stop his GH which I told him one change at a time. Pt requests Ambien to try and get sleep cycle back on track. Only 15 doses of 5mg  dose provided. Steroids may worsen.  Pt to go to ER if sxs persist or worsen, Pt also noted increased pulse with standing and blood pressure being lower, but no actually dizziness nor orthostatic criteria of greater than 10 pulse or 20 SBP achieved. I shared that adrenal insuff can cause autonomic instability.

## 2017-08-26 ENCOUNTER — Telehealth: Payer: Self-pay | Admitting: Internal Medicine

## 2017-08-26 NOTE — Telephone Encounter (Signed)
Call with Dr. [name] who paged the on call number. Having symptoms related to adrenal insufficiency and dysautonomic-like sx. Started hydrocort 100 TID yesterday and initially felt much better, but this morning feeling poorly again. Not sure what to do and questions whether he should be admitted. Does not have frank BP changes but does note changes in pulse sitting vs standing. Has some nausea but has been able to drink plenty of fluids.     I recommended he continue the higher dose steroids, continue to push fluids, consume a salt load such as popcorn, and monitor sx. Unclear what a hospital admission would do to alleviate his sx. Longer term plans we discussed him going back to [facility], perhaps considering restarting SSRI, and working on sleep hygiene. He will call back if sx worsening.

## 2017-08-28 ENCOUNTER — Encounter: Payer: Self-pay | Admitting: Internal Medicine

## 2017-08-28 NOTE — Progress Notes (Signed)
Pt email to Dr. Toni Amend 08/25/16    Hi Dr. Madie Reno,  Since seeing you at [facility] in April 2018, my health has significantly deteriorated.   I been having frequent episodes (every 1-2 weeks lasting several days) with a myriad of symptoms that are incapacitating.  HOWEVER, SOMETHING HAS CHANGED AND MY SYMPTOMS WHICH BEGAN 4 DAYS ARE NOT RESOLVING.  These symptoms include:    Constant nausea, loss of appetite, and weight loss  Dysphoric feeling  Dizziness when erect  Temperature regulation problems  Bottom of both my feet are erythematous with decreased sensation to pin prick from the ball of my foot to my toes  Bowel and bladder symptoms    I think I may indeed have dysautonomia.  The tilt test performed at [facility] in April was negative.  However, at home during an episode, my blood pressure drops and my pulse increases when going from the supine to erect positions.      In addition, I continue to suffer from these additional symptoms on a daily basis:  Fatigue  Muscle pain  Severely disrupted sleep as I never sleep for more than 1-2 hours at a time  Severely dry eyes and mouth  Cognitive issues    My quality of life has deteriorated to the point where I need a rescue regimen.   I am confined to my bed for days at a time and unable to function independently.      Since my body is under constant stress, should we consider increasing the steroid dose?  Please let me know what you would recommend.      Thank you in advance for your help.    Sincerely,  Bosten Shreffler

## 2017-08-30 ENCOUNTER — Encounter: Payer: Self-pay | Admitting: Internal Medicine

## 2017-08-30 ENCOUNTER — Encounter (INDEPENDENT_AMBULATORY_CARE_PROVIDER_SITE_OTHER): Payer: Self-pay

## 2017-08-31 ENCOUNTER — Encounter: Payer: Self-pay | Admitting: Internal Medicine

## 2017-08-31 NOTE — Progress Notes (Signed)
PT EMAIL TO DR ZOXWRUEA    Dear Dr. Madie Reno,     Please thank your nurse for taking my call and the help she provided me on Friday, January 4th.     I was advised by my primary care physician to take 100 mg of hydrocortisone Q8H for three days and then to double my usual dose (25-30 mg/day) until I returned to [facility]. The rescue dosage worked like Pharmacist, community. I began to feel better almost immediately and now the nausea, loss of appetite, dizziness with standing, chills, cold feet, and perceived cognitive difficulties have markedly improved. I cannot remember the last time I felt this good!     Understanding that I am a patient and not the doctor, is it possible that I have been experiencing relative adrenal insufficiency for an extended period of time? Could something else be stressing my body (dysautonomia, collagen vascular disease, diabetes, etc.) and depleting my cortisone level? Could some of my symptoms be consistent with dysautonomia? I tested negative for the tilt table test at [facility], but I was not experiencing an "episode" at the time the test was performed.     I am anxious to get your opinion on this and am able to travel to [facility] as soon as you can see me to have any additional testing you recommend.     Thank you in advance for your help,     Shannon [name]     Going back to [facility] on Sunday for a week for extensive testing

## 2017-09-04 DIAGNOSIS — Z125 Encounter for screening for malignant neoplasm of prostate: Secondary | ICD-10-CM

## 2017-09-04 HISTORY — DX: Encounter for screening for malignant neoplasm of prostate: Z12.5

## 2017-09-05 ENCOUNTER — Encounter (INDEPENDENT_AMBULATORY_CARE_PROVIDER_SITE_OTHER): Payer: Self-pay

## 2017-09-05 NOTE — Telephone Encounter (Addendum)
Hi Dr. Madie Reno,  Since seeing you at [facility] in April 2018, my health has significantly deteriorated.   I been having frequent episodes (every 1-2 weeks lasting several days) with a myriad of symptoms that are incapacitating.  HOWEVER, SOMETHING HAS CHANGED AND MY SYMPTOMS WHICH BEGAN 4 DAYS ARE NOT RESOLVING.  These symptoms include:    Constant nausea, loss of appetite, and weight loss  Dysphoric feeling  Dizziness when erect  Temperature regulation problems  Bottom of both my feet are erythematous with decreased sensation to pin prick from the ball of my foot to my toes  Bowel and bladder symptoms    I think I may indeed have dysautonomia.  The tilt test performed at [facility] in April was negative.  However, at home during an episode, my blood pressure drops and my pulse increases when going from the supine to erect positions.      In addition, I continue to suffer from these additional symptoms on a daily basis:  Fatigue  Muscle pain  Severely disrupted sleep as I never sleep for more than 1-2 hours at a time  Severely dry eyes and mouth  Cognitive issues    My quality of life has deteriorated to the point where I need a rescue regimen.   I am confined to my bed for days at a time and unable to function independently.      Since my body is under constant stress, should we consider increasing the steroid dose?  Please let me know what you would recommend.      Thank you in advance for your help.    Sincerely,  Shannon [name]      Erythema pictures from patient:

## 2017-09-13 ENCOUNTER — Ambulatory Visit (INDEPENDENT_AMBULATORY_CARE_PROVIDER_SITE_OTHER): Payer: Medicare Other | Admitting: Internal Medicine

## 2017-09-13 VITALS — BP 140/78 | HR 69 | Temp 98.2°F | Resp 18 | Wt 190.4 lb

## 2017-09-13 DIAGNOSIS — E291 Testicular hypofunction: Secondary | ICD-10-CM

## 2017-09-13 DIAGNOSIS — H53462 Homonymous bilateral field defects, left side: Secondary | ICD-10-CM

## 2017-09-13 DIAGNOSIS — E2749 Other adrenocortical insufficiency: Secondary | ICD-10-CM

## 2017-09-13 DIAGNOSIS — E230 Hypopituitarism: Secondary | ICD-10-CM

## 2017-09-13 DIAGNOSIS — R5382 Chronic fatigue, unspecified: Secondary | ICD-10-CM

## 2017-09-13 DIAGNOSIS — N50812 Left testicular pain: Secondary | ICD-10-CM | POA: Insufficient documentation

## 2017-09-13 DIAGNOSIS — Z86011 Personal history of benign neoplasm of the brain: Secondary | ICD-10-CM

## 2017-09-13 DIAGNOSIS — B370 Candidal stomatitis: Secondary | ICD-10-CM

## 2017-09-13 DIAGNOSIS — M797 Fibromyalgia: Secondary | ICD-10-CM

## 2017-09-13 DIAGNOSIS — G9332 Myalgic encephalomyelitis/chronic fatigue syndrome: Secondary | ICD-10-CM

## 2017-09-13 MED ORDER — CLOTRIMAZOLE 10 MG MT TROC
10.0000 mg | Freq: Every day | OROMUCOSAL | 0 refills | Status: DC
Start: 2017-09-13 — End: 2018-02-13

## 2017-09-13 NOTE — Progress Notes (Signed)
Chief Complaint   Patient presents with   . Thrush     Started 3-4 days ago.    . Follow up from [facility]       HPI    Shannon A. 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 He is following up today.  He has noticed some increasing difficulty with oral thrush.  This started 3-4 days ago.  He denies any painful swallowing but does notice some whitish plaque on his tongue.  He has had this in the past and it was treated successfully with nystatin  He has been using nystatin the last 3 days without clear improvement. He has been able to eat and drink normally.     Of note, he recently had an extensive evaluation at the [facility] last week during which he saw a number of different physicians including rheumatology (no new recommendations or findings), ophthalmology (stable findings), urology (negative testicular US), endocrinology (see below), neurology (see below) and had E consultation with a neurosurgeon (no changes in meningioma and no intervention proposed).  This was in regards to his chronic difficulties with a meningioma of the brain and history of adrenal insufficiency.  His most recent records have been reviewed and summarized below. He notes that his symptoms of adrenal crisis and fatigue have been much improved with the recent steroid burst.  He has continued on higher doses of hydrocortisone.  Dr. Rusty Aus had put him on as high as 100 mg 3 times a day following which he felt much improved and was able to function and exercise again -  this was tapered during his time at the [facility].  Most recently he took 210 mg yesterday. He does not have a local endocrinologist (had been seen at [facility] in 2017 but felt he did not have a good experience). Unclear from the documentation from [facility] precisely what their recommendations on his steroid dose (they note he was tapering down from 130 to 110 mg daily). A1C was 5.9 and DEXA wnl.     Active Problems  Patient Active  Problem List    Diagnosis Date Noted   . History of meningioma of the brain 11/09/2015     Priority: High     12/09/02: s/p R parieto-occipital parasagittal craniotomy for 85-90% resection of R pareito-occipital falx meningioma that originally measured 61 x 24 mm    - 09/06/17: electronic eval w [name] (neurosurg-[facility]): Dr. Rosanne Ashing residual occipital parasagittal meningioma appears stable between 2017 and 2019 though it enlarged a little between 2008 and 2017. It is now calcifying, suggesting that further growth is unlikely. I do not think it is responsible for any ongoing symptoms. I would suggest a follow up MRI in 2 years.  - 09/05/17: eval w Dr. Vira Blanco (Neuro- [facility]): I reviewed the MRI scan of the head which shows a posterior fall seen and parafalcine meningioma which is minimally enlarged compared to January 2017 and not significantly changed since April 2018. 1. Parasagittal meningioma which is essentially stable and calcified on CT scan. Dr.Parney advised against reoperation if stable; it is more stable than unstable but at the request of Dr. [name] , I will request an E consultation from [name]. 2. I am unsure about the episodes of acute dysphoria, fatigue, nausea and other symptoms. A favorable response to steroids may suggest adrenal insufficiency especially in the context of his already known pituitary problems. However, corticosteroids may have favorable effects  in many situations. He is scheduled to see Dr. Madie Reno in endocrinology again and I look forward to his opinion in that regard. 3. I am unsure of the cause of his chronic pain. He really does not have any myofascial trigger points that would be in keeping with fibromyalgia and would be very unusual with severe chronic fibromyalgia not to have demonstrable trigger points. This problem has been going on for quite a long time, however. 4. I reviewed his autonomic reflex studies which do not provide convincing evidence for  dysautonomia. As noted, he is able to get erections and ejaculate, which is reassuring about his autonomic function.   - 10/27/16: eval w [name] (Neurology - [facility]): agree w [name] that lesion is stable and that calcification should be reassuring . I recommended he remain under imaging surveillance. I have told him that it is very unlikely that there is a dysautonomia to account for his sx when upright, but I personally like to be sure that we have no evidence for that. He will be seen in Sleep Medicine and I endorse that as well.   - 07/04/16: eval w  [name] (Neurosurgery -[facility]): stable both clinically and on imaging w regard to his now calcified residual parasagittal meningioma. ... Now that it is calcified, I am not convinced there is any change between Jan and Oct of this year. I think continued observation would be very reasonable. Recommend FU MRI in one year.   - eval w [name], Neurosurgery [facility]  - rec FU 1 year  - MRI Brain w/wo contrast 06/07/16: Again seen is a L posterior parafalcine meningioma. There is a slightly greater measurement in the cephalocaudal plane which may be related to technique or minimal tumor progression. Progressive opacification of the L mastoid air cells. Otherwise stable exam  - 10/13/16: letter from [name]: Neurosurgery at [facility]: "CT scan does show dents of calcification.Marland KitchenMarland KitchenAs we discussed in clinic I think it is unlikely that ongoing sx are related to meningioma .[name] I would continue to follow w MRI scan in one year.   - 10/07/15: eval w [name], Neurosurgery at [facility]: "number of nonspecific sx. I am not certain that they are related to any enlargement of his residual meningioma and certainly would not expect them to be improved by any treatment of his residual meningioma. With that in minds I think the question to establish is whether or not this residual is in fact enlarging. ... I suspect that this lesion is stable and in fact calcified. If  that were the case, observation with another MRI scan in a year may be very reasonable. Alternatively we could consider either gamma knife radiosurgery or craniotomy and resection. The lesion is at the UL for size for what we could treat w gamma knife, but I think it could be accomplished in one or perhaps two staged procedures. However I would only recommend this if the lesion was clearly enlarging and I am not certain of this at the present time. Recommend CT scan of head to confirm that lesion is calcified. I will be in touch with him after this is available.        . Chronic fatigue fibromyalgia syndrome 01/24/2013     Priority: High     In Aug 2006 hd noted spontaneous onset of severe fatigue and myalgias. He had extensive evaluation for CT and autoimmune disease. Ultimately he was discovered to have inadequate thyroid replacement as well  as a low free testosterone. He was evaluated by endocrine at that time. ... Ultimately he was diagnosed with fibromyalgia and chronic fatigue syndrome for which he was prescribed various medications including provigil, cymbalta, gabapentin and pregabalin, and elavil (all w/o improvement).     - 09/08/17: evaluation with Rheum ([facility]): ASSESSMENT / PLAN #1 Pituitary insufficiency  #2 Secondary adrenal insufficiency  #3 Myalgias  #4 Sicca symptoms: My suspicion is that the patient has an underlying rheumatologic process that accounts for his steroid responsive symptoms here is exceedingly low. The constellation of the symptoms that he describes during a crisis is not typical for any specific autoimmune disease such as lupus, rheumatoid arthritis, vasculitis, or inflammatory myopathy. In particular, inflammatory myopathy would typically present with painless muscle weakness, and his muscle strength is preserved, and he is endorsing more myalgia, more so than anything else. I reviewed the pictures he brought in with him for the episodes in his hands and feet, and I am at a bit of a  loss as to what this is. It does not look like complex regional pain syndrome, erythemalgia, or Raynaud's. I do note he has had extensive serologic testing here so far including ANA, ENAs, complements, all of which are within normal limits as are inflammatory markers. To complete the serologic evaluation, I think we can check cryoglobulins, ANCAs, and a CCP antibody, and given the back pain, which seems more myofascial than intra-articular or muscular, would recommend just obtaining plain x-rays of the spine as well as the feet and ankles. He is on board with this. I will review the results with him on the portal.  - 06/22/17: Today patient is mainly here to talk about his chronic pain.  He carries a dx from previous providers of chronic pain and fibromyalgia syndrome.  He has been extensively evaluated in past for this and other conditions by [facility].  He has another annual exam with [facility] coming up in next 5 mos.  He notes that he has had more difficulty with fatigue, generalized muscle pain and "brain fog."  He has submitted routine blood work to [employer] to monitor his adrenal insufficiency and hypothyroidism and these are reportedly stable.  He feels "terrible" and has difficulty leaving house due to pain and fatigue.  He has tried a wide variety of meds in past to help with fibromyalgia wo specific improvement.  These are summarized below.  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  Currently he takes tramadol 25-50 mg once or twice daily and this helps but does not entirely alleviate his pain.  Pain is worse in the morning, improves somewhat through the day.  Fatigue is always present.  He was recommended by his psychiatrist and neurologist at [facility] to try Remeron and donepezil.  He did not tolerate these.  He wonders about additional treatment options. Currently he feels like insomnia  is better but fatigue and pain are worse. Despite extensive workup with many specialists in past at [facility] he wonders if there is some occult cause of symptoms which has not been identified.  - 10/08/15:  note from [name] [facility] Fibromyalgia and Chronic Fatigue): presentation is c/w fibromyalgia and chronic fatigue. Advised to complete Fibro/CFS treatment program  - 10/06/15 Note from Neurology Dr. Raynelle Fanning Hammack: In Aug 2006 noted spontaneous onset of severe fatigue and myalgias. He  had extensive evaluation for CT and autoimmune disease. Ultimately he was discovered to have inadequate thyroid replacement as well as low free testosterone. He was eval by endocrine  ... Ultimately he was diagnosed with fibromyalgia and chronic fatigue syndrome for which he was prescribed various medications including provigil, cymbalta, gabapentin and pregabalin.      [name] History of insomnia 01/17/2017     Priority: Medium     - 10/31/16: eval w Endocrine Dr. Tawanna Cooler [facility]): I believe this is a major contributor - we did have him do overnight oximetry with using CPAP and this did show continued desaturations that were very long periods of time when he was up and not able to fall asleep - he is seeing the sleep center      . Secondary adrenal insufficiency 01/17/2017     Priority: Medium     - 09/06/17: eval w Endocrine Dr. Eyvonne Mechanic [facility]): Although after we diagnosed secondary adrenal insufficiency, growth hormone deficiency, hypogonadotropic hypogonadism, and primary hypothyroidism associated with a partially empty sella on imaging, and he had an initial positive response to replacement as time has gone on, he really has not done well and has had several episodes where he gets a virus where he gets very ill with muscle aches like he always has the flu and has nausea, vomiting, and lightheadedness and in general on physiologic hydrocortisone, he awakes in the morning with discomfort, particularly in the upper back  area, and after taking his medications it will be several hours until he has enough energy to get up and do just minor tasks. Around [place], he was ill with what sounds like a viral illness for 3 days and basically had a crisis. He was treated with 100 mg of hydrocortisone orally every 8 hours, and all of his symptoms resolved. He subsequently tapered down on oral hydrocortisone and in last 3 or 4 days has been taking between 130 and 110 mg total per day and continues to feel well as he is slowly tapering this down. We are collecting a 24-hour urine free cortisol on this dose. His other hormone levels are interesting as well. He is on GH at 0.3 mg a day. In the past, this had IGF-1's at the upper end of the normal range to mildly elevated, now the Z-score is just +1.28 and despite taking the AndroGel cream on a daily basis, his testosterone level was only 19, whereas in past this was normal. He is sure that he had not forgotten testosterone at that time. His thyroid tests are similar to what they have been in the past with a normal free T4 of 1.5 and TSH mildly suppressed. The current ACTH is less than 1. Other issues will be discussed below. ASSESSMENT / PLAN  #1 Empty sella with pituitary insufficiency  #2 Secondary adrenal insufficiency  It is very unusual that he feels perfectly well now after several days of high-dose hydrocortisone. The explanation for this is unclear, but I think it boils down to 1 of 4 things. Either he is malabsorbing the hydrocortisone, he is super metabolizing the hydrocortisone, he has developed cortisol resistance, or he has an undiagnosed steroid-sensitive disease. There is really no evidence for some other disease with all the other testing that we have done including multiple rheumatologic antibodies. I did order genetic gene interaction testing and am waiting for review of this from the pharmacologist to see if there is any suggestion of metabolism abnormalities regarding his  various hormones.  The 24-hour urine cortisol should give some sort of hint regarding absorption. In any event, the plan we have developed is to continue to slowly taper the oral hydrocortisone over the next several days to weeks, and I would note a tapering schedule for this depending on the clinical response hopefully we will be able to maintain him not feeling well without significant adverse glucocorticoid effects. Along these lines, I will also get a baseline bone density and body composition, and I am going to check a cortisol level this morning 2 to 3 hours after he took 50 mg of Cortef, and I am going to repeat the testosterone 1 to 2 hours after he applied the AndroGel. #3 Secondary hypogonadism, on replacement : Unclear why his testosterone is so different than before where we test this. It may simply be a lab error, but it is unlikely that he did not apply the testosterone which he does every morning. Both he and his wife are sure that he took it.   #4 Growth hormone deficiency:   Again is unclear why the same dose is resulting in the lower IGF-1, but will increase the dose to 0.4 mg per day.  #5 Primary hypothyroidism, on replacement  There is likely a pituitary component to this as well, and will maintain current replacement which is keeping his free thyroxine very stable at 1.5.  #6 Cognitive dysfunction  This was thoroughly evaluated a year ago without a specific diagnosis. He feels now after several days of higher doses of hydrocortisone that his thinking is definitely clearing. #7 Dry eyes  Appreciate the evaluation from Ophthalmology.  #8 Temperature dysmodulation  Still feels this occasionally, but this has also improved with the hydrocortisone. There is no evidence for dysautonomia.   #9 Mild ascending aortic dilation and aortic insufficiency  Would recommend repeating an echocardiogram next year, which would be 2 years from the original. This remains asymptomatic.  #10 Meningioma  This is  unchanged on MRI scanning from 2008 and confirms a slight increase from 2017. Neurology has recommended observation. [name] from Neurosurgery will review as well. #11 Muscle pain  This has basically resolved with the high doses of hydrocortisone. I doubt there is underlying muscle disease, and it may well be a manifestation of partial adrenal insufficiency as it has this achy typical feeling patients get in their muscles.  #12 Hyperglycemia  The fasting glucose of 157 was nonfasting, and we will continue to monitor this.  #13 Unusual cutaneous response to minimal trauma  He brings 2 photos, one of his hands and one of his foot. He bumped his knuckles likely on a treadmill, and he developed erythema on the back of his hand, which extended to the 1st interphalangeal joints of his fingers, and there was a very distinct demarcation around this area of erythema, which resolved after about 6 hours. Similarly, a pair of shoes that was a little uncomfortable that he had on for just a couple of hours; when he took his shoe off, there was demarcated erythema around the 1st metatarsophalangeal joint, and once again, this resolved. I am not sure what the nature of this is. Perhaps Rheumatology has an idea.  - 08/31/17: Pt email: I was advised by pcp to take 100 mg of hydrocortisone Q8H for three days and then double my usual dose (25-30 mg/day) until I returned to [facility]. The rescue dosage worked like Pharmacist, community. I began to feel better almost immediately and now nausea, loss of appetite, dizziness with  standing, chills, cold feet, and perceived cognitive difficulties have markedly improved. I cannot remember the last time I felt this good!   - 11/23/16: eval w Endocrine Dr. Tawanna Cooler [facility]): due for reassessment of FT4 and IGF 1 in 4 weeks after changing dose on 10/27/16. Mail in specimen. Pursue recs for insomnia from psych. MRI did not show signs of dementia. PFT nl. FU 6-12 mos  - 10/24/16: eval w Endocrine Dr. Tawanna Cooler [place]  [facility]): despite adequate replacement w levothyroxine, GH and hydrocortisone he still has significant fatigue, although there has been a definite benefit from the hormone treatment. Levothyroxine dose is a little high given the slightly low TSH and this is not uncommon to see replacement dose requirements decrease for thyroid replacement when [facility] is initiated. We are waiting on reports on IGF1 and testosterone.      . 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 James Ivanoff: "complicated situation. Recommend trying remeron 15 mg to help w sleep, mood and anxiety. ... If his sleep can be improved that could potentally improve his cognition. Try it for at least 6-8 weeks. He can let me know how he is doing via patient portal. I also recommended that he try to simplify his medication and situations, meaning not using klonopin or percocet. I cautioned him on marijuana use. Recommend continuing w cognitive behavioral therapy and psychotherapy techniques.      . Amnestic MCI (mild cognitive impairment with memory loss) 11/20/2016     Priority: Medium     - had dizziness on donepezil and remeron - Chelan'd both - as of 01/18/17 - on half tab of donepezil 10 mg  - 11/24/16: eval w Neurology Dr. Betsey Holiday [facility]): He does have insomnia, and remeron just commenced which makes good sense. He may also benefit from cholinergic stimulation and therefore donepezil may be helpful here. .. We discussed potential role of amyloid PET testing. I will arrange an amyvid PET scan. If negative, early AD is effectively ruled out. Consider brain rehab program. Meet back after PET testing.   - 10/31/16: eval w Endocrine Dr. Eyvonne Mechanic [facility]): Dr. Earney Hamburg completed neuropsychologic testing and incidates he has very focal impairment in verbal memory and to a lesser extent in visual memory and indicates this is consistent with the amnestic variety of mild cognitive  impairment. Neurology is arranging for further testing with MRI for cognitive dysfunction and brain PET scan.   - 10/28/16 eval w Dr Azucena Kuba [facility] Neuropsych): impaired performance in several measures of verbal learning and memory and some marginally poor performances in measures of nonverbal learning and memory. Marland KitchenMarland KitchenMarland KitchenThese results are significant and while neuropsychological testing in and of itself cannot diagnose any particular condition you would potentially benefit from further discussion, possibly with our colleagues in Neurology to determine if there is some underlying condition that may be contributing to these memory difficulties even beyond your history of brain tumor.      . Growth hormone deficiency 03/29/2016     Priority: Medium     - Jan 2019: eval w Dr. Madie Reno [facility]) see recs under adrenal insufficiency in problem list  -- 10/31/16: eval w Endocrine Dr. Tawanna Cooler [facility]): increase GH to 0.4 mg daily  - 08/26/16:  eval w Dr. Tawanna Cooler [facility]): suspect that some of these issues will improve longer he is on [place] [facility] .Marland KitchenMarland Kitchen Therefore I am not  concerned with him taking 7.5 mg -10 mg of prednisone a day or equivalent if that is what he needs in next several months.   - 07/11/16: eval w Dr. Tawanna Cooler [place] [facility] Endocrine): he did have very + response to hydrocortisone replacement  .[name] However he would lose effect after 4 hours. .. We have talked about options and could try prednisone with longer half life - swtch to t mg in AM and 1 mg between 3-5 pm and adjust as needed. He has been on finasteride for many years and give trial off of this. New rx for androgel packets. Thyroid function is excellent.   - 12/10/15: eval w Dr. Tawanna Cooler [place] [facility]ocrine): no response to [facility] after glucagon stimulation. He does have some sx that could be explained by partial adrenal insufficiency ... I would like to proceed with a trial of glucocorticoid replacement now prior to starting [facility] to see if he has typical response we  would see w cortisol replacement. ... If his s do not improve w hydrocortisone in replacement doses this can be [place]. I think however we still would need to follow this given the unclear nature of the underlying pituitary process and the possibility that deficits may develop as time goes on.        . Essential hypertension 01/24/2013     Priority: Medium     - eval w Dr. Ova Freshwater (Nephor/HTN at [facility]): BP seems better, monitor home readings - not having significant autonomic dysfunction  -ECHO 10/27/16 [facility]: nl LV size and systolic function w EF of 65% - mild AV regurgitation and mild ascending aortic dilatation - recommend repeat ECHO in 12 months  - 10/26/16: eval w Dr. Ova Freshwater (Nephro/HTN at [facility]): recommend change amlodipine to 10 mg from 5 mg and take at night. Continue ramipril 20 mg. I would hesitate to start clonidine given dry eyes and dry mouth. I would hesitate to put him on diuretic given the adrenal insufficiency. I would therefore lead toward putting him on something like low dose alpha blocker like carvedilol. ... He is agreeable to try this. We will follow up next month.   - 10/25/16: eval w Dr. Allen Norris (Cardiology): mild AV regurge based on outside echo, repeat echo. Borderline mid ascending aortic dilatation. Recommend ECHO in 12 months for FU. Lipid profile appears appropriate     . Mixed hyperlipidemia 01/24/2013     Priority: Medium     TC 189 TRI 112 HDL 81 LDL 86 09/14/16 -     Pt reports he has statin for many years (was taken off to see if this would help with fibromyalgia pain wo improvement) and zetia and tricor     . Primary hypothyroidism 01/24/2013     Priority: Medium     - reports lab work from [facility] reportedly normal - T4 nl in Oct 2018 TSH not done  - TSH 0.436 as of 12/20/16 FT4 wnl  - 10/31/16: eval w Endocrine Dr. Tawanna Cooler [facility]): FT4 mildly elevated on 200 mcg day - decrease to 150 mcg daily and recheck FT4 in 8 weeks       . Hemianopia, homonymous, left 09/15/2015      Priority: Low     - 09/04/17: eval w Dr. 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 I will get a baseline visual field since his last exam was 2 years ago. He reports dry eye. Prior [employer]  and SSB were negative. Schirmer's test today showed normal tear production. The dry eye is mostly from meibomian gland dysfunction and therefore unrelated to Sjogren's. His main visual complaint is blurred vision and tunnel vision during his "spells" that he attributes to low cortisol. He is currently being treated with cortisone and has minimal symptoms. A lack of cortisone would not typically cause visual symptoms, but if his blood pressure was dropping, this could be contributing. He is scheduled for autonomic testing later this week. Addendum: 24-2 Humphrey visual field showed a small left inferior homonymous scotoma  - evaluation w Ophthalmology Dr. Ferd Glassing /24/17:  this patient continues to do exceptionally well almost 13 years following resection of a right parietooccipital falcine meningioma. Prior to surgery, he had an inferior left homonymous defect that improved following surgery and has subsequently stabilized with minimal residual.Even though he has a moderate residual lesion abutting and occluding the posterior sagittal sinus, with slight compression of the medial aspect of the left occipital lobe, his examin today remains absolutely stable with no evidence of a worsening field defect and no evidence of papilledema or optic atrophy. He has a stable nevus in the right fundus. I will see him again in 1-2 years. In meantime, he has asked for a suggestion of an endocrinologist who deals with pituitary hormone dysfunction and he also may be interested in seeing a radiation therapist for discussion of stereotactic radiosurgery using a gamma knife (as opposed to the Cyberknife). I have recommended that he discuss these issues with [name] when he sees  him tomorrow.     . Secondary male hypogonadism 01/24/2013     Priority: Low     - - 10/31/16: eval w Endocrine Dr. Tawanna Cooler [facility]): testosterone level is excellent right now - continue current therapy         . Left testicular pain 09/13/2017     09/07/17 evaluation w Urology at [facility]: ASSESSMENT / PLAN: 1 Left Testicular Pain I had the pleasure meeting with Dr. [name] in consultation today. We discussed his current symptomatology. We discussed the results of his scrotal ultrasound which does not note any concerning pathology to account for the left-sided testicular pain. He has noted improvement with the use of scrotal support. I did discuss with him consideration of a referral to our [facility] colleagues, potential PT for pelvic floor assessment or question as to whether nerve block would be appropriate. At this point in time the patient would like to defer anything invasive and is reassured with his negative ultrasound imaging.             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  There is no underlying cord signal abnormality.  There is no central canal nor is there foraminal narrowing.  C3-C4: There is a disc osteophyte complex with a superimposed posterior central disc protrusion which partially effaces the ventral CSF and indents the ventral cervical spinal cord.  There is no central canal stenosis or foraminal narrowing.  C4-C5: There is a disc osteophyte complex with prominent posterior lateral disc component within the foramina bilaterally, right greater than left.  Posterior disc bulge partially effaces the ventral CSF and indents the right ventral cervical spinal cord.  There is mild central canal stenosis.  There is no foraminal narrowing.  C5-C6: There is disc space narrowing and disc desiccation.  There  is a disc osteophyte complex with prominent uncovertebral hypertrophy, right greater than left.  Posterior disc bulge partially effaces the ventral CSF.  There is no central canal stenosis.  There is mild to moderate right-sided foraminal encroachment.  The left foramen is patent.  C6-C7: There is disc space narrowing and disc desiccation.  There is a disc osteophyte complex with right posterior lateral disc herniation partially effacing the ventral CSF and mildly flattening.  The underlying cervical spinal cord.  There is marked right-sided foraminal encroachment as well as mild left-sided foraminal narrowing.  There is facet arthropathy.  C7-T1: There is no evidence of central canal or foraminal stenosis.  Overall impression: Multilevel cervical spondylosis as described in detail above.  - evaluation w [facility] Dr. Penni Bombard Nov 2018     . History of herniated intervertebral disc 01/17/2017     2005: developed R hip and leg pain and was told he had "small central disk herniation" at L5-S1. Surgery was not recommended. He received PT and some epidural corticosteroid injections which improved but did not completely resolve the situation. Low back and leg pain recurred in 2006 and was again tx w steroid and PT.      . Mild OSA  01/17/2017     - 10/31/16: eval w sleep medicine Dr. Ala Dach  [facility]): reviewed original records from sleep study in Sept 2017 and CPAP trial in Oct 2017 ... My opinion at this time his OSA is not sig enough to require a focused effort on using his CPAP consistently ... Particularly since he reports not having found CPAP to be helpful or restful, I think it is best to set it aside for the time being and at some point another sleep lab study and titration may be helpful. ... Depending on comorbidities this degree of OSA is worth an effort at treating but when there has not been any consistent benefit even in the past when he tried it consistently, then it is reasonable to put  on hold for the time being.      . Chronic diarrhea 01/17/2017     - eval w ID 11/21/16 Dr. Marcy Panning [facility]): has had extensive evaluation. I cannot identify an infectious disease that is likely to cause symptomatology. As he does have some cognitive decline and chronic diarrhea I will be checking a whipple PCR on blood although I think this is highly unlikely as he does not also have joint symptoms. A duodenal aspirate would be needed to confirm or completely rule out this diagnosis. I will also be checking for heavy metals. Given the amount of fish he consumes I am expecting to have high levels organic arsenic which are not toxic. . I will also be testing for stronglyloides which can be found in [place] and [place]. I will call if any of these tests are positive.      . Recurrent sinusitis 01/17/2017     - 11/21/16: eval w ENT Dr. Jannette Spanner [facility]): For recurrent sinusitis, he is on good medical regiment w daily nasal irrigations. Surgery would be indicated for 4-5 episodes of sinusitis per year. He was not interested in operative intervention and this is reasonable. In re to his deviated septum septoplasty could be considered. He was not interested at this time. To address his nasal obstruction on a mucosal basis I recommended triple spray. Avoid afrin. Offered him fluoroscopic eval of his dysphagia  but he deferred. Would not recommend surgery for OSA.      [name] Panhypopituitarism 03/29/2016   . Chronic, continuous use of opioids 11/09/2015     - [facility] Recommend tramadol for severe breakthrough pain.  MRI of the cervical spine ordered.  Continue at-home exercises and stretches and physical therapy.  Follow-up in 4 weeks for further evaluation  - As of May 2018 pt reports he gets his pain medication and klonazepam from [facility]  - 07/01/16: note from [facility] Dr. Toni Amend: Pt informed I will no longer refill percocet, clonazepam, or provigil as there are no clear  disease indications. Pt upset and feels he needs these meds for his CF/FMS. I advised pt that he will need to have either rheum or pain specialist provide if they feel they are indicated. Pt has shown no evidence of abuse, but also has been on meds for several years and has shown no benefit in my opinion. Pt is currently being treated for panhypopit and adrenal insuff, hypogonadism, and hypothyroidism as a result, as wellas, growth hormone deficiency. Further analysis suggests polypharmacy with potential drug interactions. The pt knows how to wean clonazepam and has asked for 30days of tramadol to wean from the percocet. Note he is only on a half tablet of percocet qid. As a result will provide only 25mg  tabs to start qday and increase every third day to bid and tid max for 30 days total. No refills will be provided. Pt is aware.      [name] History of asthma 01/24/2013     - since childhood    - 11/23/16: eval w Dr. Renita Papa ([facility]): bronchial asthma mild, allergic rhinitis postnasal drainage: uses albuterol BID for chest tightness. Check nitric oxide test. He has had intermittent eosinophilia and this points towards an eosinophilic asthma phenotype. He does have allergic rhinitis and think he would be better servied with our triple combination nose spray which includes mometasone, diphenhydramine and iptratroprium. I gave him rx for this.      [name] BPH (benign prostatic hyperplasia) 01/24/2013       The patient's past medical history, surgical history, medications and allergies were reviewed.    Allergies   Allergen Reactions   . Bextra [Valdecoxib] Rash       Medications  Current Outpatient Prescriptions   Medication Sig Dispense Refill   . amLODIPine (NORVASC) 10 MG tablet Take 10 mg by mouth daily.     . ANDROGEL 20.25 MG/1.25GM (1.62%) Gel   1   . aspirin 81 MG EC tablet Take 81 mg by mouth daily.       . Calcium Carbonate-Vitamin D (CALCIUM + D PO) Take by mouth. 500/400mg   daily      . carvedilol (COREG) 6.25  MG tablet Take 12.5 mg by mouth 2 (two) times daily with meals.         . clonazePAM (KLONOPIN) 0.5 MG tablet Take 0.25 mg by mouth daily as needed.      2   . Coenzyme Q10 100 MG capsule Take 100 mg by mouth daily.     . cycloSPORINE (RESTASIS) 0.05 % ophthalmic emulsion Place 1 drop into both eyes every 12 (twelve) hours. 3 each 4   . ezetimibe (ZETIA) 10 MG tablet TAKE ONE TABLET BY MOUTH ONE TIME DAILY  90 tablet 3   . FLOVENT HFA 110 MCG/ACT inhaler Inhale 2 puffs into the lungs 2 (two) times  daily.         . hydrocortisone (CORTEF) 10 MG tablet Varies daily. Take 1 & 1/2 tablet every morning,1 tablet at noon and one half tablet between 3 and 5pm daily.     [name] levothyroxine (SYNTHROID, LEVOTHROID) 150 MCG tablet Take 1 tablet (150 mcg total) by mouth Once a day at 6:00am. 90 tablet 3   . montelukast (SINGULAIR) 10 MG tablet Take 1 tablet (10 mg total) by mouth nightly. 90 tablet 3   . Multiple Vitamins-Minerals (CENTRUM SILVER PO) Take 1 tablet by mouth daily.       . NUTROPIN AQ NUSPIN 10 10 MG/2ML Solution 0.4 mg SQ daily - per [facility] (Patient taking differently: Inject 0.3 mg into the skin daily.0.4 mg SQ daily - per [facility]    )     . promethazine (PHENERGAN) 25 MG tablet Take 1 tablet (25 mg total) by mouth every 6 (six) hours as needed for Nausea.for up to 12 doses 12 tablet 0   . pyridoxine (B-6) 100 MG tablet Take 100 mg by mouth daily.     . ramipril (ALTACE) 10 MG capsule Take 2 capsules (20 mg total) by mouth daily. (Patient taking differently: Take 20 mg by mouth 2 (two) times daily.    ) 180 capsule 3   . simvastatin (ZOCOR) 20 MG tablet TAKE ONE TABLET BY MOUTH NIGHTLY 90 tablet 3   . testosterone (ANDROGEL) 25 MG/2.5GM (1%) Gel 2.5 mg daily.      1   . Testosterone 20.25 MG/ACT (1.62%) Gel Place onto the skin.     [name] traMADol (ULTRAM) 50 MG tablet 1/2 tab daily - per [facility] (Patient taking differently: Take 50 mg by mouth 2 (two) times daily.1/2 tab daily - per [facility]    ) 20 tablet 0   . vitamin B-12  (CYANOCOBALAMIN) 1000 MCG tablet Take 1 tablet (1,000 mcg total) by mouth daily. 30 tablet 11   . VITAMIN D, CHOLECALCIFEROL, PO Take 5,000 Units by mouth daily.        [name] zolpidem (AMBIEN) 5 MG tablet Take 1 tablet (5 mg total) by mouth nightly as needed for Sleep. 15 tablet 0   . clotrimazole (MYCELEX) 10 MG troche Take 1 tablet (10 mg total) by mouth 5 (five) times daily. 70 tablet 0     No current facility-administered medications for this visit.        Review of Systems  CONST: No weight change, no fevers/chills sweats, fatigue at baseline, muscle aches    Physical Exam  BP 140/78 (BP Site: Right arm, Patient Position: Sitting, Cuff Size: Medium)   Pulse 69   Temp 98.2 F (36.8 C) (Oral)   Resp 18   Wt 86.4 kg (190 lb 6.4 oz)   SpO2 97%   BMI 27.32 kg/m   Wt Readings from Last 3 Encounters:   09/13/17 86.4 kg (190 lb 6.4 oz)   08/09/17 87.3 kg (192 lb 6.4 oz)   06/22/17 84.4 kg (186 lb 1.6 oz)     GEN: Well developed well nourished male alert and appropriate in no apparent distress  HENT: oropharynx w whitish exudate no erythema    Assessment/Plan    1. Oral thrush  Stop nystatin - trial of clotrimazole. Consider fluconazole if not effective  - clotrimazole (MYCELEX) 10 MG troche; Take 1 tablet (10 mg total) by mouth 5 (five) times daily.  Dispense: 70 tablet; Refill: 0    2. Secondary adrenal insufficiency  Pt would  benefit from endocrinologist in the area to assist him with steroid treatment and management though he still does correspond electronically also with [facility]. Referral to endocrine provided, recommend he continue slow taper of hydrocortisone as previously directed by [facility] Dr. Madie Reno.   - Ambulatory referral to Endocrinology    3. Secondary male hypogonadism  4. Hemianopia, homonymous, left  5. History of meningioma of the brain    Orders Placed This Encounter   Procedures   . Ambulatory referral to Endocrinology     Risks & benefits of the new medication(s) were explained to the patient, who  appeared to understand and agrees to the treatment plan.    Return if symptoms worsen or fail to improve.    Gloriajean Dell, MD  [facility]  [address]  [place], [place] [address]  P) [phone]  F) [phone]  [email or web address]

## 2017-09-18 ENCOUNTER — Ambulatory Visit: Payer: Medicare Other | Admitting: Internal Medicine

## 2017-09-19 ENCOUNTER — Other Ambulatory Visit: Payer: Self-pay | Admitting: Internal Medicine

## 2017-09-19 DIAGNOSIS — I10 Essential (primary) hypertension: Secondary | ICD-10-CM

## 2017-09-20 ENCOUNTER — Telehealth: Payer: Self-pay | Admitting: Internal Medicine

## 2017-09-20 NOTE — Telephone Encounter (Signed)
Follow up phone call to see how he was doing.  He stated that the right calf pain went away and the oral thrush was cleared. He asked me to inform Dr. Orlin Hilding that he called the endocrinologist that was recommended and he has an appointment at the end of February.

## 2017-09-27 ENCOUNTER — Encounter: Payer: Self-pay | Admitting: Endocrinology, Diabetes and Metabolism

## 2017-09-27 ENCOUNTER — Ambulatory Visit (INDEPENDENT_AMBULATORY_CARE_PROVIDER_SITE_OTHER): Payer: Medicare Other | Admitting: Endocrinology, Diabetes and Metabolism

## 2017-09-27 VITALS — BP 158/84 | HR 84 | Ht 70.0 in | Wt 189.4 lb

## 2017-09-27 DIAGNOSIS — E230 Hypopituitarism: Secondary | ICD-10-CM

## 2017-09-30 ENCOUNTER — Encounter (INDEPENDENT_AMBULATORY_CARE_PROVIDER_SITE_OTHER): Payer: Self-pay

## 2017-10-03 NOTE — Progress Notes (Signed)
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 Gloriajean Dell for [facility], for evaluation of hypopituitarism presumably secondary to brain surgery for meningioma. I reviewed the notes, imaging reports and labs in EPIC. He is accompanied by his spouse.   He is seen at the endocrine department at the [facility] clinic where he usually gets his endocrine care, and is looking for a local endocrinologist.    Shannon [name] seeing him for hypopituitarism,a dn specifically for secondary adrenal insufficiency (AI). He has been on quite high doses of hydrocortisone (HC) at time (300+ mg daily) to treat symptoms of nausea, extreme fatigue, and dizziness. He has been trying to taper the dose of HC and in the recent days, he has been using a total daily dose of 40 to 50 mg or so.      I copied part of the notes from a recent visit with Dr. Orlin Hilding to summarize his history:       Shannon A. Waren, Shannon [name] is a 67 y.o. male former pt of Dr. Toni Amend here for evaluation and treatment of the following concerns. Patient is today here for a same-day visit for some left-sided foot pain localized to his 1st metatarsal.  He notes he was in his usual state of health until he was wearing some new boots and following this noted some onset of pain in his left 1st metatarsal.  He also has noted some mild swelling and redness and apparent bruising in this area.  He discontinued wearing the new boots.  His symptoms have improved in the last 24 hours.  He notes the erythema has improved.  There is some mild swelling in apparent bruising which remains, but this is improved.  His pain is improved.     Patient has a chronic history of chronic fatigue and fibromyalgia syndrome and has had extensive evaluation in the past at [facility] and at [facility].  He has been not responsive to a multitude of medications in the past to help with his pain and mood and function.  He notes that he continues to get "attacks"  of fatigue for 5 times per year where he is entirely debilitated.  Most recently he had a weeklong attack several weeks ago which also resolved.  He is back to his baseline, but continues to feel poorly.  He plans to follow up with the [facility] in January or February 2019.  He feels frustrated by his lack of other definitive diagnosis.  He continues to have some sensation of coldness and temperature change in his feet.  He is not a smoker.  He has no history of gout.  [facility]  Dr. Penni Bombard who performed an MRI of his cervical spine noting some spondylosis.  He has another follow-up with Dr. Penni Bombard in the weeks to come, but the patient does not feel this may be effective as he is recommending specific interventions for pain and not widespread pain treatment.  Currently he is using tramadol, which is helpful but not entirely.  He has tried other medications in the past to include gabapentin, Lyrica, Elavil, Cymbalta, and others without apparent benefit.  He does use marijuana in the form of coconut oil for the past year and this also has some benefits in reducing his pain and improving function.     Active Problems  Patient Active Problem List    Diagnosis Date Noted   . History of meningioma of the brain 11/09/2015     Priority: High     12/09/02: s/p R parieto-occipital parasagittal craniotomy for 85-90% resection of R pareito-occipital falx meningioma that originally measured 61 x 24 mm    - 10/27/16: eval w [name] (Neurology - [facility]): agree w [name] that lesion is stable and that calcification should be reassuring . I recommended he remain under imaging surveillance. I have told him that it is very unlikely that there is a dysautonomia to account for his sx when upright, but I personally like to be sure that we have no evidence for that. He will be seen in Sleep Medicine and I endorse that as well.   - 07/04/16: eval w  Dr.  Charlotte Crumb (Neurosurgery -[facility]): stable both clinically and on imaging w regard to his now calcified residual parasagittal meningioma. ... Now that it is calcified, I am not convinced there is any change between Jan and Oct of this year. I think continued observation would be very reasonable. Recommend FU MRI in one year.   - eval w [name], Neurosurgery [facility]  - rec FU 1 year  - MRI Brain w/wo contrast 06/07/16: Again seen is a L posterior parafalcine meningioma. There is a slightly greater measurement in the cephalocaudal plane which may be related to technique or minimal tumor progression. Progressive opacification of the L mastoid air cells. Otherwise stable exam  - 10/13/16: letter from [name]: Neurosurgery at [facility]: "CT scan does show dents of calcification.Marland KitchenMarland KitchenAs we discussed in clinic I think it is unlikely that ongoing sx are related to meningioma .[name] I would continue to follow w MRI scan in one year.   - 10/07/15: eval w [name], Neurosurgery at [facility]: "number of nonspecific sx. I am not certain that they are related to any enlargement of his residual meningioma and certainly would not expect them to be improved by any treatment of his residual meningioma. With that in minds I think the question to establish is whether or not this residual is in fact enlarging. ... I suspect that this lesion is stable and in fact calcified. If that were the case, observation with another MRI scan in a year may be very reasonable. Alternatively we could consider either gamma knife radiosurgery or craniotomy and resection. The lesion is at the UL for size for what we could treat w gamma knife, but I think it could be accomplished in one or perhaps two staged procedures. However I would only recommend this if the lesion was clearly enlarging and I am not certain of this at the present time. Recommend CT scan of head to confirm that lesion is calcified. I will be in touch with him after this is available.   -  10/06/15: eval w Neurology [name] [facility]): I have told Dr. Lacretia Nicks that I really cannot give him any advise until I review outside pathology and prior films .[name] Pt requested appt in Fibromyalgia/Chronic fatigue clinic as well as Endo.   - 09/16/15 eval w [name], Neurosurgery [facility]: I do not believe any of the sx he is having now are related to the tumor. By report he did not have papilledema in [name] exam. I recommended however that he undergo CyberKnife radiosurgery. Pt will call my office to set up RadOnc consult and will proceed w CyberKnife tx.      [name]  Chronic fatigue fibromyalgia syndrome 01/24/2013     Priority: High     In Aug 2006 hd noted spontaneous onset of severe fatigue and myalgias. He had extensive evaluation for CT and autoimmune disease. Ultimately he was discovered to have inadequate thyroid replacement as well as a low free testosterone. He was evaluated by endocrine at that time. ... Ultimately he was diagnosed with fibromyalgia and chronic fatigue syndrome for which he was prescribed various medications including provigil, cymbalta, gabapentin and pregabalin, and elavil (all w/o improvement). \    - Pt email 07/11/17: I have an appointment to see Dr. Penni Bombard on Wednesday, November 21st @1 :30.  - 06/22/17: Today the patient is mainly here to talk about his chronic pain. He carries a diagnosis from previous providers of chronic pain and fibromyalgia syndrome. He has been extensively evaluated in the past for this and other conditions by the [facility]. He has another annual examination with [facility] coming up in the next 5 months. He notes that lately he has had more difficulty with fatigue, generalized muscle pain and "brain fog." He has submitted routine blood work to the [facility] to monitor his adrenal insufficiency and hypothyroidism and these are reportedly stable. He feels like sometimes he feels "terrible" and has difficulty leaving the house due to  pain and fatigue. He has tried a wide variety of medications in the past to help with fibromyalgia without specific improvement. These are summarized below. 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 Currently he takes tramadol 25-50 mg once or twice daily and this helps but does not entirely alleviate his pain. Pain is worse in the morning, improves somewhat through the day. Fatigue is always present. He was recommended by his psychiatrist and neurologist at [facility] to try Remeron and donepezil. He did not tolerate these. He wonders about additional treatment options. Currently he feels like insomnia is better but fatigue and pain are worse. Despite his extensive workup with many specialists in the past at [facility] he wonders if there is still some occult cause of his symptoms which has not been identified.  - 10/08/15:  note from [name] [facility] Fibromyalgia and Chronic Fatigue): presentation is c/w fibromyalgia and chronic fatigue. Advised to complete Fibro/CFS treatment program  - 10/06/15 Note from Neurology Dr. Raynelle Fanning [facility]: In Aug 2006 noted spontaneous onset of severe fatigue and myalgias. He had extensive evaluation for CT and autoimmune disease. Ultimately he was discovered to have inadequate thyroid replacement as well as low free testosterone. He was eval by endocrine  ... Ultimately he was diagnosed with fibromyalgia and chronic fatigue syndrome for which he was prescribed various medications including provigil, cymbalta, gabapentin and pregabalin.      [name] History of insomnia 01/17/2017     Priority: Medium     - 10/31/16: eval w Endocrine Dr. Tawanna Cooler [place] [facility]): I believe this is a major contributor - we did have him do overnight oximetry with using CPAP and this did show continued desaturations that were very long periods of time when he was up and not able to fall  asleep - he is seeing the sleep center      . Secondary adrenal insufficiency 01/17/2017     Priority: Medium     - 11/23/16: eval w Endocrine Dr. Tawanna Cooler [place] [facility]): due for reassessment of FT4 and IGF 1  in 4 weeks after changing dose on 10/27/16. Mail in specimen. Pursue recs for insomnia from psych. MRI did not show signs of dementia. PFT nl. FU 6-12 mos  - 10/24/16: eval w Endocrine Dr. [facility]awanna Cooler [place] [facility]): despite adequate replacement w levothyroxine, GH and hydrocortisone he still has significant fatigue, although there has been a definite benefit from the hormone treatment. Levothyroxine dose is a little high given the slightly low TSH and this is not uncommon to see replacement dose requirements decrease for thyroid replacement when [facility] is initiated. We are waiting on reports on IGF1 and testosterone.      . Mood disorder secondary to multiple medical problems 01/17/2017     Priority: Medium     - 12/01/16 eval w psychiatry at [facility] Dr. James Ivanoff: "complicated situation. Recommend trying remeron 15 mg to help w sleep, mood and anxiety. ... If his sleep can be improved that could potentally improve his cognition. Try it for at least 6-8 weeks. He can let me know how he is doing via patient portal. I also recommended that he try to simplify his medication and situations, meaning not using klonopin or percocet. I cautioned him on marijuana use. Recommend continuing w cognitive behavioral therapy and psychotherapy techniques.      . Amnestic MCI (mild cognitive impairment with memory loss) 11/20/2016     Priority: Medium     - had dizziness on donepezil and remeron - [place] both - as of 01/18/17 - on half tab of donepezil 10 mg  - 11/24/16: eval w Neurology Dr. Betsey Holiday [facility]): He does have insomnia, and remeron just commenced which makes good sense. He may also benefit from cholinergic stimulation and therefore donepezil may be helpful here. .. We discussed potential role of amyloid PET testing. I  will arrange an amyvid PET scan. If negative, early AD is effectively ruled out. Consider brain rehab program. Meet back after PET testing.   - 10/31/16: eval w Endocrine Dr. Eyvonne Mechanic [facility]): Dr. Earney Hamburg completed neuropsychologic testing and incidates he has very focal impairment in verbal memory and to a lesser extent in visual memory and indicates this is consistent with the amnestic variety of mild cognitive impairment. Neurology is arranging for further testing with MRI for cognitive dysfunction and brain PET scan.   - 10/28/16 eval w Dr Azucena Kuba [facility] Neuropsych): impaired performance in several measures of verbal learning and memory and some marginally poor performances in measures of nonverbal learning and memory. Marland KitchenMarland KitchenMarland KitchenThese results are significant and while neuropsychological testing in and of itself cannot diagnose any particular condition you would potentially benefit from further discussion, possibly with our colleagues in Neurology to determine if there is some underlying condition that may be contributing to these memory difficulties even beyond your history of brain tumor.      . Growth hormone deficiency 03/29/2016     Priority: Medium     - 10/31/16: eval w Endocrine Dr. [place]awanna [facility]er [place] [facility]): increase GH to 0.4 mg daily  - 08/26/16:  eval w Dr. Eyvonne Mechanic [facility] Endocrine): At this point suspect that tsome of these issues will improve the longer he is on [facility] .Marland KitchenMarland Kitchen Therefore I am not concerned with him taking 7.5 mg to 10 mg of prednisone a day or its equivalent if that is what he needs in the next several months.   - 07/11/16: eval w Dr. Tawanna Cooler [place] [facility] Endocrine): he did have very + response to hydrocortisone replacement  .[name] However he would lose  effect after 4 hours. .. We have talked about options and could try prednisone with longer half life - swtch to t mg in AM and 1 mg between 3-5 pm and adjust as needed. He has been on finasteride for many years and give trial off of this.  New rx for androgel packets. Thyroid function is excellent.   - 12/10/15: eval w Dr. Tawanna Cooler [place] [facility] Endocrine): no response to [facility] after glucagon stimulation. He does have some sx that could be explained by partial adrenal insufficiency ... I would like to proceed with a trial of glucocorticoid replacement now prior to starting [facility] to see if he has the typical response we would see w cortisol replacement. ... If his s do not improve w hydrocortisone in replacement doses this can be . I think however we still would need to follow this given the unclear nature of the underlying pituitary process and the possibility that deficits may develop as time goes on.   - 10/08/15:  note from [name] [facility] Fibromyalgia and Chronic Fatigue): presentation is c/w fibromyalgia and chronic fatigue. Advised to complete Fibro/CFS treatment program  - 10/07/15: eval w Dr. Eyvonne Mechanic [facility] Endocrine): Await testosterone levels and advise on adjustments ... We do not need to worry about adrenal insufficiency. However he has multiple sx which are c/w growth hormone deficiency and I think this is worth defining. I will do glucagon stimulation test  - if this confirms GH deficiency I would strongly recommend replacement which hopefully will improve at least some of his sx particularly a component of his fatigue, his social isolation, his decreased ambition and altered body composition. I will see him after testing is complete.      . Essential hypertension 01/24/2013     Priority: Medium     - eval w Dr. Ova Freshwater (Nephor/HTN at [facility]): BP seems better, monitor home readings - not having significant autonomic dysfunction  -ECHO 10/27/16 [facility]: nl LV size and systolic function w EF of 65% - mild AV regurgitation and mild ascending aortic dilatation - recommend repeat ECHO in 12 months  - 10/26/16: eval w Dr. Ova Freshwater (Nephro/HTN at [facility]): recommend change amlodipine to 10 mg from 5 mg and take at night. Continue  ramipril 20 mg. I would hesitate to start clonidine given dry eyes and dry mouth. I would hesitate to put him on diuretic given the adrenal insufficiency. I would therefore lead toward putting him on something like low dose alpha blocker like carvedilol. ... He is agreeable to try this. We will follow up next month.   - 10/25/16: eval w Dr. Allen Norris (Cardiology): mild AV regurge based on outside echo, repeat echo. Borderline mid ascending aortic dilatation. Recommend ECHO in 12 months for FU. Lipid profile appears appropriate     . Mixed hyperlipidemia 01/24/2013     Priority: Medium     TC 189 TRI 112 HDL 81 LDL 86 09/14/16 -     Pt reports he has statin for many years (was taken off to see if this would help with fibromyalgia pain wo improvement) and zetia and tricor     . Primary hypothyroidism 01/24/2013     Priority: Medium     - reports TSH in Oct 2018 from [facility] reportedly normal  - TSH 0.436 as of 12/20/16 FT4 wnl  - 10/31/16: eval w Endocrine Dr. Tawanna Cooler [place] [facility]): FT4 mildly elevated on 200 mcg day - decrease to 150 mcg daily and recheck  FT4 in 8 weeks       . Hemianopia, homonymous, left 09/15/2015     Priority: Low     - evaluation w Ophthalmology Dr. Ferd Glassing /24/17:  this patient continues to do exceptionally well almost 13 years following resection of a right parietooccipital falcine meningioma. Prior to surgery, he had an inferior left homonymous defect that improved following surgery and has subsequently stabilized with minimal residual.Even though he has a moderate residual lesion abutting and occluding the posterior sagittal sinus, with slight compression of the medial aspect of the left occipital lobe, his examin today remains absolutely stable with no evidence of a worsening field defect and no evidence of papilledema or optic atrophy. He has a stable nevus in the right fundus. I will see him again in 1-2 years. In meantime, he has asked for a suggestion of an  endocrinologist who deals with pituitary hormone dysfunction and he also may be interested in seeing a radiation therapist for discussion of stereotactic radiosurgery using a gamma knife (as opposed to the Cyberknife). I have recommended that he discuss these issues with [name] when he sees him tomorrow.     . Secondary male hypogonadism 01/24/2013     Priority: Low     - - 10/31/16: eval w Endocrine Dr. Tawanna Cooler [place] [facility]): testosterone level is excellent right now - continue current therapy         . Spondylosis of cervical region without myelopathy or radiculopathy 08/09/2017     - MRI C Spine w/o contrast: C2-C3: Small posterior disc bulge partially effacing the ventral CSF and gently indenting the ventral cervical spinal cord.  There is no underlying cord signal abnormality.  There is no central canal nor is there foraminal narrowing.  C3-C4: There is a disc osteophyte complex with a superimposed posterior central disc protrusion which partially effaces the ventral CSF and indents the ventral cervical spinal cord.  There is no central canal stenosis or foraminal narrowing.  C4-C5: There is a disc osteophyte complex with prominent posterior lateral disc component within the foramina bilaterally, right greater than left.  Posterior disc bulge partially effaces the ventral CSF and indents the right ventral cervical spinal cord.  There is mild central canal stenosis.  There is no foraminal narrowing.  C5-C6: There is disc space narrowing and disc desiccation.  There is a disc osteophyte complex with prominent uncovertebral hypertrophy, right greater than left.  Posterior disc bulge partially effaces the ventral CSF.  There is no central canal stenosis.  There is mild to moderate right-sided foraminal encroachment.  The left foramen is patent.  C6-C7: There is disc space narrowing and disc desiccation.  There is a disc osteophyte complex with right posterior lateral disc herniation partially effacing  the ventral CSF and mildly flattening.  The underlying cervical spinal cord.  There is marked right-sided foraminal encroachment as well as mild left-sided foraminal narrowing.  There is facet arthropathy.  C7-T1: There is no evidence of central canal or foraminal stenosis.  Overall impression: Multilevel cervical spondylosis as described in detail above.  - [facility]. Penni Bombard Nov 2018     . History of herniated intervertebral disc 01/17/2017     2005: developed R hip and leg pain and was told he had "small central disk herniation" at L5-S1. Surgery was not recommended. He received PT and some epidural corticosteroid injections which improved but did not completely resolve the situation. Low back and leg pain recurred in  2006 and was again tx w steroid and PT.      . Mild OSA  01/17/2017     - 10/31/16: eval w sleep medicine Dr. Ala Dach  [facility]): reviewed original records from sleep study in Sept 2017 and CPAP trial in Oct 2017 ... My opinion at this time his OSA is not sig enough to require a focused effort on using his CPAP consistently ... Particularly since he reports not having found CPAP to be helpful or restful, I think it is best to set it aside for the time being and at some point another sleep lab study and titration may be helpful. ... Depending on comorbidities this degree of OSA is worth an effort at treating but when there has not been any consistent benefit even in the past when he tried it consistently, then it is reasonable to put on hold for the time being.      . Chronic diarrhea 01/17/2017     - eval w ID 11/21/16 Dr. Marcy Panning [facility]): has had extensive evaluation. I cannot identify an infectious disease that is likely to cause symptomatology. As he does have some cognitive decline and chronic diarrhea I will be checking a whipple PCR on blood although I think this is highly unlikely as he does not also have joint symptoms. A duodenal aspirate would be  needed to confirm or completely rule out this diagnosis. I will also be checking for heavy metals. Given the amount of fish he consumes I am expecting to have high levels organic arsenic which are not toxic. . I will also be testing for stronglyloides which can be found in [place] and [place]. I will call if any of these tests are positive.      . Recurrent sinusitis 01/17/2017     - 11/21/16: eval w ENT Dr. Jannette Spanner [facility]): For recurrent sinusitis, he is on good medical regiment w daily nasal irrigations. Surgery would be indicated for 4-5 episodes of sinusitis per year. He was not interested in operative intervention and this is reasonable. In re to his deviated septum septoplasty could be considered. He was not interested at this time. To address his nasal obstruction on a mucosal basis I recommended triple spray. Avoid afrin. Offered him fluoroscopic eval of his dysphagia but he deferred. Would not recommend surgery for OSA.      [name] Panhypopituitarism 03/29/2016   . Chronic, continuous use of opioids 11/09/2015     - As of May 2018 pt reports he gets his pain medication and klonazepam from [facility] [facility]  - 07/01/16: note from [facility] Dr. Toni Amend: Pt informed I will no longer refill percocet, clonazepam, or provigil as there are no clear disease indications. Pt upset and feels he needs these meds for his CF/FMS. I advised pt that he will need to have either rheum or pain specialist provide if they feel they are indicated. Pt has shown no evidence of abuse, but also has been on meds for several years and has shown no benefit in my opinion. Pt is currently being treated for panhypopit and adrenal insuff, hypogonadism, and hypothyroidism as a result, as wellas, growth hormone deficiency. Further analysis suggests polypharmacy with potential drug interactions. The pt knows how to wean clonazepam and has asked for 30days of tramadol to wean from the percocet. Note he is only on a half tablet of percocet qid. As a  result will provide only 25mg  tabs to start qday and increase every third day to bid and  tid max for 30 days total. No refills will be provided. Pt is aware.      [name] History of asthma 01/24/2013     - since childhood    - 11/23/16: eval w Dr. Renita Papa (Pulm at [facility]): bronchial asthma mild, allergic rhinitis postnasal drainage: uses albuterol BID for chest tightness. Check nitric oxide test. He has had intermittent eosinophilia and this points towards an eosinophilic asthma phenotype. He does have allergic rhinitis and think he would be better servied with our triple combination nose spray which includes mometasone, diphenhydramine and iptratroprium. I gave him rx for this.      [name] BPH (benign prostatic hyperplasia) 01/24/2013       As far as the AI, there was a question on whether he absorbs the [facility]. A 24 hour UFC was obtained and showed elevated free cortisol values indicative of adequate absorption. The patient raised the question of whether he could be metabolizing the [facility] "too fast" and hence explaining the increased need of HC dosing.   In addition to [facility], he is on growth Hormone replacement and the doses are adjusted by the [facility] clinic endocrinologist. He is on testosterone and levothyroxine a well. He is not of desmopressin.  He has HTN and hyperlipidemia and is on a statin and anti hypertensive drug therapy.      Below are some of his endocrine labs:  Results for JAYVEN, NAILL., Shannon [name] (MRN [id number]) as of 10/03/2017 07:43   Ref. Range 11/05/2015 07:49 03/29/2016 11:32 09/14/2016 00:00 09/15/2016 08:40   Adrenocorticotropic hormone (ACTH) Latest Ref Range: 7.2 - 63.3 pg/mL   3.9 (L)    Aldosterone Latest Ref Range: 0.0 - 30.0 ng/dL   4.9    Cortisol Latest Units: ug/dL  54.0  98.1   Cortisol - AM Latest Ref Range: 6.2 - 19.4 ug/dL   19.1    Renin Latest Ref Range: 0.167 - 5.380 ng/mL/hr   4.485    Insulin-Like GF-1 Latest Ref Range: 47 - 192 ng/mL   243 (H)    Prolactin Latest Ref Range: 4.0 - 15.2 ng/mL 11.5 15.0  14.8    Hemoglobin A1C Latest Ref Range: 4.8 - 5.6 % 5.6  5.8 (H)    Results for Shannon Band., Shannon [name] (MRN [id number]) as of 10/03/2017 07:43   Ref. Range 03/29/2016 11:32 09/14/2016 00:00 12/20/2016 13:57 01/03/2017 10:07 03/11/2017 11:34   Sodium Latest Ref Range: 136 - 145 mEq/L 143 143 144 140 141     Results for Shannon Band., Shannon [name] (MRN [id number]) as of 10/03/2017 07:43   Ref. Range 09/14/2016 00:00 12/20/2016 13:57   TSH Latest Ref Range: 0.450 - 4.500 uIU/mL 0.635 0.436 (L)   T4 Free Latest Ref Range: 0.82 - 1.77 ng/dL 4.69 6.29   T3, Total Latest Ref Range: 71 - 180 ng/dL 72    Results for Shannon Band., Shannon [name] (MRN [id number]) as of 10/03/2017 07:43   Ref. Range 09/14/2016 00:00   Testosterone Latest Ref Range: 264 - 916 ng/dL 401       Clinically his main issue is chronic pain, mainly in the back of the neck and shoulder areas, as well as fatigue.  Fibromyalgia/chronic fatigue syndrome was entertained. His weight has been stable overall. He has required different pain medication regimens in order to allow him to cop with the pain.   He describes episodes of what he refers to as "adrenal crisis" whee he feels nauseated, dizzy and very tired. He feels much better  after few days of high doses hydrocortisone. He also used parenteral Solucortef in the past to treat his symptoms.     Past Medical History:   Diagnosis Date   . Avascular necrosis 2008    R hip - did not require surgery   . Breathlessness on exertion     eval at [facility] March 2018 - saw pulmonary - PFT normal   . Cold intolerance    . Color blindness    . DJD (degenerative joint disease)     Lumbar / Sacral   . Dry eyes     uses restasis eyedrops which helps (RF, CCP, CRP and ESR have been wnl in past)   . Dry mouth    . Eczema 03/29/2016    Right thumb    . Encounter for hepatitis C screening test for low risk patient 08/2016    negative   . Encounter for pharmacogenetic testing 10/2015    [facility]   . Hemorrhoids    . Lateral epicondylitis     Left   .  Osteoporosis    . Skin neoplasm 10/21/2015   . Tinnitus     high pitched buzzing comes and goes - started after craniotomy for meningioma     Past Surgical History:   Procedure Laterality Date   . COLONOSCOPY  2008   . EXCISION, LESION  12/03/2008    benign lesion   . ORCHIOPEXY Left 1980    for intermittent torsion   . Subtotal resection of posterior sagittal meningioma  12/05/2002    Partial brain resection secondary to [facility]ioma at [facility]   . TONSILLECTOMY AND ADENOIDECTOMY  as a child     Family History   Problem Relation Age of Onset   . Asthma Daughter    . Tuberculosis Mother    . Dementia Mother    . Migraines Sister      Allergies   Allergen Reactions   . Bextra [Valdecoxib] Rash     Social History     Social History   . Marital status: Married     Spouse name: N/A   . Number of children: N/A   . Years of education: N/A     Occupational History   . Not on file.     Social History Main Topics   . Smoking status: Never Smoker   . Smokeless tobacco: Never Used   . Alcohol use No      Comment: rare   . Drug use: Yes      Comment: uses MJ for pain daily   . Sexual activity: Not on file     Other Topics Concern   . Not on file     Social History Narrative    He lives in [place] and is from [place]. Married - wife Angelique Blonder. Two daughters (one lives in [place] and one in [place]). Retired Web designer - retired in 2016.     Current Outpatient Prescriptions on File Prior to Visit   Medication Sig Dispense Refill   . amLODIPine (NORVASC) 10 MG tablet Take 10 mg by mouth daily.     . ANDROGEL 20.25 MG/1.25GM (1.62%) Gel   1   . aspirin 81 MG EC tablet Take 81 mg by mouth daily.       . Calcium Carbonate-Vitamin D (CALCIUM + D PO) Take by mouth. 500/400mg   daily      . carvedilol (COREG) 6.25 MG tablet Take 12.5 mg by mouth 2 (two) times daily  with meals.         . clonazePAM (KLONOPIN) 0.5 MG tablet Take 0.25 mg by mouth daily as needed.      2   . clotrimazole (MYCELEX) 10 MG troche Take 1 tablet (10 mg total) by mouth 5  (five) times daily. 70 tablet 0   . Coenzyme Q10 100 MG capsule Take 100 mg by mouth daily.     . cycloSPORINE (RESTASIS) 0.05 % ophthalmic emulsion Place 1 drop into both eyes every 12 (twelve) hours. 3 each 4   . ezetimibe (ZETIA) 10 MG tablet TAKE ONE TABLET BY MOUTH ONE TIME DAILY  90 tablet 3   . FLOVENT HFA 110 MCG/ACT inhaler Inhale 2 puffs into the lungs 2 (two) times daily.         . hydrocortisone (CORTEF) 10 MG tablet Varies daily. Take 1 & 1/2 tablet every morning,1 tablet at noon and one half tablet between 3 and 5pm daily.     [name] levothyroxine (SYNTHROID, LEVOTHROID) 150 MCG tablet Take 1 tablet (150 mcg total) by mouth Once a day at 6:00am. 90 tablet 3   . montelukast (SINGULAIR) 10 MG tablet Take 1 tablet (10 mg total) by mouth nightly. 90 tablet 3   . Multiple Vitamins-Minerals (CENTRUM SILVER PO) Take 1 tablet by mouth daily.       . NUTROPIN AQ NUSPIN 10 10 MG/2ML Solution 0.4 mg SQ daily - per [facility] (Patient taking differently: Inject 0.3 mg into the skin daily.0.4 mg SQ daily - per [facility]    )     . promethazine (PHENERGAN) 25 MG tablet Take 1 tablet (25 mg total) by mouth every 6 (six) hours as needed for Nausea.for up to 12 doses 12 tablet 0   . pyridoxine (B-6) 100 MG tablet Take 100 mg by mouth daily.     . ramipril (ALTACE) 10 MG capsule TAKE TWO CAPSULES BY MOUTH DAILY  180 capsule 2   . simvastatin (ZOCOR) 20 MG tablet TAKE ONE TABLET BY MOUTH NIGHTLY 90 tablet 3   . testosterone (ANDROGEL) 25 MG/2.5GM (1%) Gel 2.5 mg daily.      1   . Testosterone 20.25 MG/ACT (1.62%) Gel Place onto the skin.     [name] traMADol (ULTRAM) 50 MG tablet 1/2 tab daily - per [facility] (Patient taking differently: Take 50 mg by mouth 2 (two) times daily.1/2 tab daily - per [facility]    ) 20 tablet 0   . vitamin B-12 (CYANOCOBALAMIN) 1000 MCG tablet Take 1 tablet (1,000 mcg total) by mouth daily. 30 tablet 11   . VITAMIN D, CHOLECALCIFEROL, PO Take 5,000 Units by mouth daily.        [name] zolpidem (AMBIEN) 5 MG tablet Take 1 tablet  (5 mg total) by mouth nightly as needed for Sleep. 15 tablet 0     No current facility-administered medications on file prior to visit.      Vitals:    09/27/17 1432   BP: 158/84   Pulse: 84   SpO2: 97%     Lab Results   Component Value Date    HGBA1C 5.8 (H) 09/14/2016    HGBA1C 5.6 11/05/2015     Lab Results   Component Value Date    WBC 8.07 03/11/2017    HGB 13.8 03/11/2017    HCT 42.0 03/11/2017    PLT 287 03/11/2017    CHOL 189 09/14/2016    TRIG 112 09/14/2016    HDL 81 09/14/2016  LDL 86 09/14/2016    ALT 31 03/11/2017    AST 28 03/11/2017    NA 141 03/11/2017    K 4.5 03/11/2017    CL 105 03/11/2017    CREAT 1.4 (H) 03/11/2017    BUN 29.0 (H) 03/11/2017    CO2 26 03/11/2017    TSH 0.436 (L) 12/20/2016    PSA 0.6 09/14/2016    GLU 122 (H) 03/11/2017    HGBA1C 5.8 (H) 09/14/2016     Lab Results   Component Value Date    TSH 0.436 (L) 12/20/2016    T4 7.2 03/29/2016       Review of Systems    Constitutional: fatigue and chronic pain  Eyes: negative  Respiratory: negative  Cardiovascular: negative  Gastrointestinal: negative  Musculoskeletal: negative   Neurological: negative  Behavioral: negative  Skin: negative  Hematology: negative      Examination  Vitals - 1 value per visit 09/27/2017   SYSTOLIC 158   DIASTOLIC 84   PULSE 84   TEMPERATURE    RESPIRATIONS    Weight (lb) 189.4   Weight Method    HEIGHT 5\' 10"    Height Method    BODY MASS INDEX 27.18 kg/m2   BODY SURFACE AREA 2.06 m2     Constitutional: Oriented to person, place, and time and well-developed, well-nourished, and in no distress.   Head: Normocephalic and atraumatic.   Eyes: Conjunctivae and EOM are normal. Pupils are equal, round, and reactive to light.   Neck: Normal thyroid exam.  Cardiac: Normal auscultation.  Pulmonary: Normal auscultation.  Extremities: No cyanosis, clubbing or edema.  Musculoskeletal: Normal range of motion.   Neurological: Alert and oriented to person, place, and time.   Psychiatric: Affect and judgment normal.  He does  not have cushingoid features.    Imaging report    CT Head WO Contrast [[id number]] (Order [id number])   Status: Final result   Study Result     History: Nausea. History of meningioma.    TECHNIQUE: Noncontrast CT of the brain. The following dose reduction  techniques were utilized: automatic exposure control and/or adjustment  of mA and/or kV according to patient size, and the use of iterative  reconstruction technique.    COMPARISON: MRI of the brain dated 06/07/2016.    FINDINGS: Again seen are postoperative changes with evidence of parietal  and occipital craniotomy. There is stable encephalomalacia involving the  right parietal and occipital lobes. There is a 27 x 24 mm lobulated  partially calcified extra-axial mass centered upon the posterior aspect  of the falx cerebri, eccentric towards the left, which appears to invade  the superior sagittal sinus. This lesion corresponds to the enhancing  extra-axial mass seen on the prior MRI study and is consistent with  meningioma. The mass indents the medial left parietal and occipital  lobes without significant rectal edema.    There is no evidence of acute infarction or intracranial hemorrhage. The  ventricles are normal without hydrocephalus. The orbits appear  unremarkable. The visualized portions of the paranasal sinuses are  clear.    IMPRESSION:     1. No acute intracranial abnormality.  2. Partially calcified extra-axial mass centered upon the posterior  aspect of the falx cerebri which invades the superior sagittal sinus is  similar in configuration compared to the previous MRI study of  06/07/2016 and is consistent with meningioma.    Georgann Housekeeper, Shannon [name]   03/11/2017 11:39 AM     MRI Brain  W WO Contrast [[id number]] (Order [id number])   Status: Final result   Study Result                          [facility]                                 MRI [place], [place]                                 EXAM PERFORMED AT:  [id number]        DOB:07-13-1951                 [address]  06/07/2016     AGE:66   Gender:M              Physicians Only: [phone]             Coralyn Mark Shannon [name]                                 [F]       [address]       [place], [place] [address]        MRI BRAIN WITHOUT AND WITH CONTRAST    HISTORY: Followup meningioma    COMPARISON: A prior brain MR 09/10/15    TECHNIQUE: MR imaging of the brain was performed on a 1.5 Tesla magnet  prior to and following the uneventful intravenous administration of 8 cc of  Gadavist contrast material.    FINDINGS: Again noted are postsurgical changes centered in the right  occipital lobe. There is a resection cavity surrounded by hyperintense  T2/FLAIR signal which is stable in volume. No masslike or nodular  enhancement is seen at the level of resection.    Again identified is an extra-axial enhancing mass lesion involving the  midline occipital and posterior left parietal region. It measures  approximately 2.8 cm AP x 2.5 cm transverse x 4.3 cm tall (compared to 2.8  cm AP x 2.5 cm transverse x 4 cm tall when measured on the postcontrast  images). Again seen is invasion/occlusion of the adjacent superior sagittal  sinus. There is mild mass effect on the left parasagittal occipital lobe  however there is no cerebral edema.    There is stability of a subtle dural thickening in the left parafalcine  region superiorly.    The brain parenchyma maintains an otherwise normal signal and enhancement  pattern. Ventricular size and position is normal. There are appropriate  flow voids of the major vessels.    There is stability of a largely CSF filled sella turcica.    There is progressive opacification of the left mastoid air cells. There is  no significant paranasal sinus disease.    IMPRESSION:     1. Again seen is a left posterior parafalcine meningioma. There is a  slightly greater measurement in the cephalocaudal plane which may be  related to  technique or minimal tumor progression.  2. Progressive opacification of the left mastoid air cells.  3. Otherwise stable exam.    The examination was reviewed with the patient.     Electronically signed by: Otho Ket M.D.  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 Patient is currently on [facility] for (AI), levothyroxine, testosterone and growth hormone replacement.  The question as I discussed with him is whether he is using the [facility] to treat non specific symptoms as described above or what he refers to as "quality of life" or whether he does need high doses due to some issue with HC metabolism. I explained to him that the symptoms he experiences at times are not very specific for adrenal disease and there is concern about chronic over exposure to corticosteroid therapy, with which he is quite familiar.    I encouraged him to continue to slowly taper the dose of HC to a more physiologic dose, in the range of 20 mg in am and 10 mg in pm. One can also use a dose that is based on his body surface area. In case of suspicion of "adrenal crisis", I suggested he uses one injection of Solucortef rather than taking high oral doses of HC for several days.    I also recommended a second opinion with Dr. Leonides Grills, chief, Section of Endocrinology at [facility], in order to discuss the adrenal issue. I'm happy to continue to see him here at [facility]. I also explained to him that have not prescribed growth hormone replacement for adults so he will continue to follow with the [facility] clinic on that.    He can return in few months or as needed.      Elijio Miles.H.Tamera Punt, Shannon [name]  [facility]  [address]  [place], [place] [address]  Tel: [phone]  Fax: [phone]    [address]  [place], [place] [address]  Tel: [phone]  Fax: [phone]

## 2017-10-04 ENCOUNTER — Other Ambulatory Visit: Payer: Self-pay | Admitting: Internal Medicine

## 2017-10-06 ENCOUNTER — Other Ambulatory Visit: Payer: Self-pay | Admitting: Internal Medicine

## 2017-10-06 MED ORDER — ALBUTEROL SULFATE HFA 108 (90 BASE) MCG/ACT IN AERS
INHALATION_SPRAY | RESPIRATORY_TRACT | 3 refills | Status: DC
Start: 2017-10-06 — End: 2017-10-13

## 2017-10-06 NOTE — Telephone Encounter (Signed)
Pt needs refill on albuterol inhaler     [facility] [phone] - [place], [place] - [address] [phone] (Phone)  [phone] (Fax)     He's leaving town next week.  Thx!

## 2017-10-13 ENCOUNTER — Other Ambulatory Visit: Payer: Self-pay | Admitting: Internal Medicine

## 2017-10-13 DIAGNOSIS — Z8709 Personal history of other diseases of the respiratory system: Secondary | ICD-10-CM

## 2017-10-13 MED ORDER — ALBUTEROL SULFATE HFA 108 (90 BASE) MCG/ACT IN AERS
1.0000 | INHALATION_SPRAY | RESPIRATORY_TRACT | 6 refills | Status: DC | PRN
Start: 2017-10-13 — End: 2018-11-04

## 2017-10-18 ENCOUNTER — Ambulatory Visit: Payer: Medicare Other | Admitting: Endocrinology, Diabetes and Metabolism

## 2017-10-26 ENCOUNTER — Other Ambulatory Visit: Payer: Self-pay | Admitting: Internal Medicine

## 2017-10-26 DIAGNOSIS — J4540 Moderate persistent asthma, uncomplicated: Secondary | ICD-10-CM

## 2017-10-28 ENCOUNTER — Encounter (INDEPENDENT_AMBULATORY_CARE_PROVIDER_SITE_OTHER): Payer: Self-pay

## 2017-11-28 ENCOUNTER — Encounter (INDEPENDENT_AMBULATORY_CARE_PROVIDER_SITE_OTHER): Payer: Self-pay

## 2017-11-29 ENCOUNTER — Other Ambulatory Visit: Payer: Self-pay | Admitting: Family Medicine

## 2017-11-29 ENCOUNTER — Other Ambulatory Visit: Payer: Self-pay | Admitting: Internal Medicine

## 2017-11-29 DIAGNOSIS — E785 Hyperlipidemia, unspecified: Secondary | ICD-10-CM

## 2017-11-30 ENCOUNTER — Other Ambulatory Visit: Payer: Self-pay | Admitting: Internal Medicine

## 2017-11-30 ENCOUNTER — Encounter: Payer: Self-pay | Admitting: Internal Medicine

## 2017-11-30 DIAGNOSIS — I10 Essential (primary) hypertension: Secondary | ICD-10-CM

## 2017-11-30 MED ORDER — AMLODIPINE BESYLATE 10 MG PO TABS
5.0000 mg | ORAL_TABLET | Freq: Two times a day (BID) | ORAL | 0 refills | Status: DC
Start: 2017-11-30 — End: 2017-12-01

## 2017-11-30 MED ORDER — CARVEDILOL 6.25 MG PO TABS
12.5000 mg | ORAL_TABLET | Freq: Two times a day (BID) | ORAL | 1 refills | Status: DC
Start: 2017-11-30 — End: 2018-06-28

## 2017-12-01 ENCOUNTER — Encounter: Payer: Self-pay | Admitting: Internal Medicine

## 2017-12-20 ENCOUNTER — Encounter: Payer: Self-pay | Admitting: Internal Medicine

## 2017-12-21 ENCOUNTER — Other Ambulatory Visit: Payer: Self-pay | Admitting: Internal Medicine

## 2017-12-21 ENCOUNTER — Telehealth: Payer: Self-pay | Admitting: Internal Medicine

## 2017-12-21 ENCOUNTER — Encounter: Payer: Self-pay | Admitting: Internal Medicine

## 2017-12-21 DIAGNOSIS — Z87898 Personal history of other specified conditions: Secondary | ICD-10-CM

## 2017-12-21 DIAGNOSIS — K117 Disturbances of salivary secretion: Secondary | ICD-10-CM | POA: Insufficient documentation

## 2017-12-21 MED ORDER — SCOPOLAMINE 1 MG/3DAYS TD PT72
1.0000 | MEDICATED_PATCH | TRANSDERMAL | 0 refills | Status: DC
Start: 2017-12-21 — End: 2018-02-13

## 2017-12-21 MED ORDER — SCOPOLAMINE 1 MG/3DAYS TD PT72
1.0000 | MEDICATED_PATCH | TRANSDERMAL | 0 refills | Status: DC
Start: 2017-12-21 — End: 2017-12-21

## 2017-12-21 NOTE — Telephone Encounter (Signed)
Done

## 2017-12-21 NOTE — Telephone Encounter (Signed)
Patient called and needs the scopolamine increased to 10 patches as he is on an 8 day cruise.  Please send to [facility].  [facility] is to call around to another [facility] which has the 10 pack.  Patient is leaving tomorrow morning.

## 2017-12-28 ENCOUNTER — Encounter (INDEPENDENT_AMBULATORY_CARE_PROVIDER_SITE_OTHER): Payer: Self-pay

## 2018-01-26 ENCOUNTER — Other Ambulatory Visit: Payer: Self-pay | Admitting: Internal Medicine

## 2018-01-26 DIAGNOSIS — E039 Hypothyroidism, unspecified: Secondary | ICD-10-CM

## 2018-01-28 ENCOUNTER — Encounter (INDEPENDENT_AMBULATORY_CARE_PROVIDER_SITE_OTHER): Payer: Self-pay

## 2018-01-29 ENCOUNTER — Encounter: Payer: Self-pay | Admitting: Internal Medicine

## 2018-01-29 DIAGNOSIS — C44311 Basal cell carcinoma of skin of nose: Secondary | ICD-10-CM | POA: Insufficient documentation

## 2018-02-13 ENCOUNTER — Encounter: Payer: Self-pay | Admitting: Internal Medicine

## 2018-02-13 ENCOUNTER — Other Ambulatory Visit: Payer: Self-pay | Admitting: Internal Medicine

## 2018-02-13 ENCOUNTER — Ambulatory Visit (INDEPENDENT_AMBULATORY_CARE_PROVIDER_SITE_OTHER): Payer: Medicare Other | Admitting: Internal Medicine

## 2018-02-13 VITALS — BP 144/78 | HR 56 | Temp 97.5°F | Resp 16 | Wt 182.8 lb

## 2018-02-13 DIAGNOSIS — E2749 Other adrenocortical insufficiency: Secondary | ICD-10-CM

## 2018-02-13 DIAGNOSIS — Z0000 Encounter for general adult medical examination without abnormal findings: Secondary | ICD-10-CM

## 2018-02-13 DIAGNOSIS — R7301 Impaired fasting glucose: Secondary | ICD-10-CM

## 2018-02-13 DIAGNOSIS — I10 Essential (primary) hypertension: Secondary | ICD-10-CM

## 2018-02-13 DIAGNOSIS — C44311 Basal cell carcinoma of skin of nose: Secondary | ICD-10-CM

## 2018-02-13 DIAGNOSIS — N401 Enlarged prostate with lower urinary tract symptoms: Secondary | ICD-10-CM

## 2018-02-13 DIAGNOSIS — E039 Hypothyroidism, unspecified: Secondary | ICD-10-CM

## 2018-02-13 DIAGNOSIS — E291 Testicular hypofunction: Secondary | ICD-10-CM

## 2018-02-13 DIAGNOSIS — B351 Tinea unguium: Secondary | ICD-10-CM

## 2018-02-13 DIAGNOSIS — R350 Frequency of micturition: Secondary | ICD-10-CM

## 2018-02-13 DIAGNOSIS — R110 Nausea: Secondary | ICD-10-CM

## 2018-02-13 MED ORDER — FINASTERIDE 5 MG PO TABS
5.0000 mg | ORAL_TABLET | Freq: Every day | ORAL | 0 refills | Status: DC
Start: 2018-02-13 — End: 2018-04-18

## 2018-02-13 MED ORDER — METOCLOPRAMIDE HCL 5 MG PO TABS
5.0000 mg | ORAL_TABLET | Freq: Four times a day (QID) | ORAL | 0 refills | Status: DC | PRN
Start: 2018-02-13 — End: 2019-02-12

## 2018-02-13 NOTE — Progress Notes (Signed)
Labs drawn from right AC using aseptic technique without any problems.  Lab specimens sent to [facility].

## 2018-02-13 NOTE — Patient Instructions (Signed)
Please schedule an appointment with Neurology:    [facility]  [address]  [place] [address]  [phone]  Fax [phone]    [address]  [address]  [place], [place] [address]  [phone]  Fax [phone]    Harrel Lemon MD  Dulcy Fanny MD

## 2018-02-13 NOTE — Addendum Note (Signed)
Addended by: Lonell Grandchild on: 02/13/2018 02:01 PM     Modules accepted: Orders

## 2018-02-13 NOTE — Progress Notes (Signed)
Chief Complaint   Patient presents with   . 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 Overall he is feeling at baseline today.  He recently returned from a cruise to [place] which he enjoyedand was able to do some activities.  He still does some occasional exercise.  His weight is down 8 pounds from last visit.  He does continue to struggle with some insomnia.  Ambien was not particularly effective.  He occasionally has used melatonin.  Sometimes he can improve his insomnia by reading and then falling asleep.  He has some hip and back pain and uses nonsteroidal anti-inflammatory drugs for this 4-600 mg 3 times per week of ibuprofen.  He feels like the tramadol is not effective to help him with this pain.     BCC: Patient was recently diagnosed with basal cell carcinoma of the nose by his dermatologist and is pending a Mohs evaluation with Dr. Hermine Messick in July 2019.     HYPOTHYROIDISM: He does struggle with chronic fatigue.  His thyroid-stimulating hormone was last checked in January at [facility] and at that time his thyroid-stimulating hormone was 0.09  He notes that his thyroid dose was decreased, but he has not rechecked his blood work. He is fasting today and requests routine blood work in advance of his annual physical and related to his medical problems.     NAUSEA: He has some chronic intermittent nausea and has been treated in the past with Phenergan without particular relief.  He has also tried Reglan which seemed to work better.  He requests a refill of this to help him with periodic nausea.     URINARY FREQUENCY: he notes a history of benign prostatic hypertrophy and nocturia in the past and was treated in the past with finasteride with improvement though this was discontinued during his evaluation at [facility] for concern that it could be contributing to some of his hormonal concerns.  He is interested  in going back on this to help him with urinary frequency and nocturia.  He wakes 2-3 times per night to go to the bathroom.  He would like his prostate-specific antigen checked. He denies any pain with urination.  He denies any heavy fluid consumption in the evening.     ONYCOMYCOSIS: He would like his liver functions checked as his dermatologist has considered treating him with Lamisil for onychomycosis    Active Problems  Patient Active Problem List    Diagnosis Date Noted   . History of meningioma of the brain 11/09/2015     Priority: High     12/09/02: s/p R parieto-occipital parasagittal craniotomy for 85-90% resection of R pareito-occipital falx meningioma that originally measured 61 x 24 mm    - 09/06/17: electronic eval w [name] (neurosurg-[facility]): Dr. Rosanne Ashing residual occipital parasagittal meningioma appears stable between 2017 and 2019 though it enlarged a little between 2008 and 2017. It is now calcifying, suggesting that further growth is unlikely. I do not think it is responsible for any ongoing symptoms. I would suggest a follow up MRI in 2 years.  - 09/05/17: eval w Dr. Vira Blanco (Neuro- [facility]): I reviewed the MRI scan of the head which shows a posterior fall seen and parafalcine meningioma which is minimally enlarged compared to January 2017 and not significantly changed since April 2018. 1. Parasagittal meningioma which  is essentially stable and calcified on CT scan. Dr.Parney advised against reoperation if stable; it is more stable than unstable but at the request of Dr. [name] , I will request an E consultation from [name]. 2. I am unsure about the episodes of acute dysphoria, fatigue, nausea and other symptoms. A favorable response to steroids may suggest adrenal insufficiency especially in the context of his already known pituitary problems. However, corticosteroids may have favorable effects in many situations. He is scheduled to see Dr. Madie Reno in endocrinology again and I  look forward to his opinion in that regard. 3. I am unsure of the cause of his chronic pain. He really does not have any myofascial trigger points that would be in keeping with fibromyalgia and would be very unusual with severe chronic fibromyalgia not to have demonstrable trigger points. This problem has been going on for quite a long time, however. 4. I reviewed his autonomic reflex studies which do not provide convincing evidence for dysautonomia. As noted, he is able to get erections and ejaculate, which is reassuring about his autonomic function.   - 10/27/16: eval w [name] (Neurology - [facility]): agree w [name] that lesion is stable and that calcification should be reassuring . I recommended he remain under imaging surveillance. I have told him that it is very unlikely that there is a dysautonomia to account for his sx when upright, but I personally like to be sure that we have no evidence for that. He will be seen in Sleep Medicine and I endorse that as well.   - 07/04/16: eval w  [name] (Neurosurgery -[facility]): stable both clinically and on imaging w regard to his now calcified residual parasagittal meningioma. ... Now that it is calcified, I am not convinced there is any change between Jan and Oct of this year. I think continued observation would be very reasonable. Recommend FU MRI in one year.   - eval w [name], Neurosurgery [facility]  - rec FU 1 year  - MRI Brain w/wo contrast 06/07/16: Again seen is a L posterior parafalcine meningioma. There is a slightly greater measurement in the cephalocaudal plane which may be related to technique or minimal tumor progression. Progressive opacification of the L mastoid air cells. Otherwise stable exam  - 10/13/16: letter from [name]: Neurosurgery at [facility]: "CT scan does show dents of calcification.Marland KitchenMarland KitchenAs we discussed in clinic I think it is unlikely that ongoing sx are related to meningioma .[name] I would continue to follow w MRI scan in one year.    - 10/07/15: eval w [name], Neurosurgery at [facility]: "number of nonspecific sx. I am not certain that they are related to any enlargement of his residual meningioma and certainly would not expect them to be improved by any treatment of his residual meningioma. With that in minds I think the question to establish is whether or not this residual is in fact enlarging. ... I suspect that this lesion is stable and in fact calcified. If that were the case, observation with another MRI scan in a year may be very reasonable. Alternatively we could consider either gamma knife radiosurgery or craniotomy and resection. The lesion is at the UL for size for what we could treat w gamma knife, but I think it could be accomplished in one or perhaps two staged procedures. However I would only recommend this if the lesion was clearly enlarging and I am not certain of this at the present time.  Recommend CT scan of head to confirm that lesion is calcified. I will be in touch with him after this is available.        . Chronic fatigue fibromyalgia syndrome 01/24/2013     Priority: High     In Aug 2006 hd noted spontaneous onset of severe fatigue and myalgias. He had extensive evaluation for CT and autoimmune disease at [facility] . Ultimately he was discovered to have inadequate thyroid replacement as well as a low free testosterone. He was evaluated by endocrine at that time. ... Ultimately he was diagnosed with fibromyalgia and chronic fatigue syndrome for which he was prescribed various medications including provigil, cymbalta, gabapentin and pregabalin, and elavil (all w/o improvement).     - 09/08/17: evaluation with Rheum ([facility]): A/P #1 Pituitary insufficiency  #2 Secondary adrenal insufficiency  #3 Myalgias  #4 Sicca symptoms: My suspicion is that pt has an underlying rheumatologic process that accounts for his steroid responsive symptoms here is exceedingly low. The constellation of symptoms that he  describes during a crisis is not typical for any specific autoimmune disease such as lupus, rheumatoid arthritis, vasculitis, or inflammatory myopathy. In particular, inflammatory myopathy would typically present with painless muscle weakness, and his muscle strength is preserved, and he is endorsing more myalgia, more than anything else. I reviewed pictures he brought in with him for episodes in his hands and feet, and I am at a bit of a loss as to what this is. It does not look like CRPS, erythemalgia, or Raynaud's. I do note he has had extensive serologic testing here so far including ANA, ENAs, complements, all of which are within normal limits as are inflammatory markers. To complete serologic evaluation, I think we can check cryoglobulins, ANCAs, and a CCP antibody, and given back pain, which seems more myofascial than intra-articular or muscular, would recommend just obtaining plain x-rays of spine as well as feet and ankles. He is on board with this. I will review the results with him on the portal.  - 06/22/17: Today patient is mainly here to talk about his chronic pain.  He carries a dx from previous providers of chronic pain and fibromyalgia syndrome.  He has been extensively evaluated in past for this and other conditions by [facility].  He has another annual exam with [facility] coming up in next 5 mos.  He notes that he has had more difficulty with fatigue, generalized muscle pain and "brain fog."  He has submitted routine blood work to [employer] to monitor his adrenal insufficiency and hypothyroidism and these are reportedly stable.  He feels "terrible" and has difficulty leaving house due to pain and fatigue.  He has tried a wide variety of meds in past to help with fibromyalgia wo specific improvement.  These are summarized below.  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  Currently he  takes tramadol 25-50 mg once or twice daily and this helps but does not entirely alleviate his pain.  Pain is worse in the morning, improves somewhat through the day.  Fatigue is always present.  He was recommended by his psychiatrist and neurologist at [facility] to try Remeron and donepezil.  He did not tolerate these.  He wonders about additional treatment options. Currently he feels like insomnia is better but fatigue and pain are worse. Despite extensive workup with  many specialists in past at [facility] he wonders if there is some occult cause of symptoms which has not been identified.  - 10/08/15:  note from [name] [facility] Fibromyalgia and Chronic Fatigue): presentation is c/w fibromyalgia and chronic fatigue. Advised to complete Fibro/CFS treatment program  - 10/06/15 Note from Neurology Dr. Raynelle Fanning Hammack: In Aug 2006 noted spontaneous onset of severe fatigue and myalgias. He had extensive evaluation for CT and autoimmune disease. Ultimately he was discovered to have inadequate thyroid replacement as well as low free testosterone. He was eval by endocrine  ... Ultimately he was diagnosed with fibromyalgia and chronic fatigue syndrome for which he was prescribed various medications including provigil, cymbalta, gabapentin and pregabalin.      . Basal cell carcinoma (BCC) of skin of nose 01/29/2018     Priority: Medium     Dx in June 2019 recommended for MOHS in July 2019     - eval w Dr. Rickard Patience 01/02/18: s/p biopsy L nasal area     . History of insomnia 01/17/2017     Priority: Medium     - 10/31/16: eval w Endocrine Dr. Tawanna Cooler [facility]): I believe this is a major contributor - we did have him do overnight oximetry with using CPAP and this did show continued desaturations that were very long periods of time when he was up and not able to fall asleep - he is seeing the sleep center      . Secondary adrenal insufficiency 01/17/2017     Priority: Medium     - 09/29/17 Dr. Tamera Punt endo:  Hypopituitarism in setting of brain surgery for meningioma. Patient is currently on [facility] for (AI), levothyroxine, testosterone and GH replacement. The question as I discussed with him is whether he is using HC to treat non specific symptoms or what he refers to as "quality of life" or whether he does need high doses due to some issue with HC metabolism. I explained to him that symptoms he experiences are not very specific for adrenal disease and there is concern about chronic over exposure to corticosteroid therapy, with which he is quite familiar. I encouraged him to continue to slowly taper dose of HC to a more physiologic dose, in range of 20 mg in am and 10 mg in pm. One can also use a dose that is based on his body surface area. In case of suspicion of "adrenal crisis", I suggested he uses one injection of Solucortef rather than taking high oral doses of HC for several days. I also recommended second opinion with Dr. Leonides Grills, Chief, Section of Endocrinology at [facility], in order to discuss adrenal issue. I'm happy to continue to see him here at [facility]. I also explained to him that have not prescribed growth hormone replacement for adults so he will continue to follow with the [facility] clinic on that. He can return in few months or as needed.  - 09/06/17: eval w Endocrine Dr. Eyvonne Mechanic [facility]): Although after we diagnosed secondary adrenal insufficiency, growth hormone deficiency, hypogonadotropic hypogonadism, and primary hypothyroidism associated with a partially empty sella on imaging, and he had an initial positive response to replacement as time has gone on, he really has not done well and has had several episodes where he gets a virus where he gets very ill with muscle aches like he always has the flu and has nausea, vomiting, and lightheadedness and in general on physiologic hydrocortisone, he awakes in morning with  discomfort, particularly in upper back area, and after  taking his medications it will be several hours until he has enough energy to get up and do just minor tasks. Around [place], he was ill with what sounds like a viral illness for 3 days and basically had a crisis. He was treated with 100 mg of hydrocortisone orally every 8 hours, and all of his symptoms resolved. He subsequently tapered down on oral hydrocortisone and in last 3 or 4 days has been taking between 130 and 110 mg total per day and continues to feel well as he is slowly tapering this down. We are collecting a 24-hour urine free cortisol on this dose. His other hormone levels are interesting as well. He is on GH at 0.3 mg a day. In past, this had IGF-1's at the upper end of normal range to mildly elevated, now Z-score is just +1.28 and despite taking AndroGel cream on a daily basis, his testosterone level was only 19, whereas in past this was normal. He is sure that he had not forgotten testosterone at that time. His thyroid tests are similar to what they have been in past with a normal free T4 of 1.5 and TSH mildly suppressed. The current ACTH is less than 1. Other issues will be discussed below. A/P  #1 Empty sella with pituitary insufficiency  #2 Secondary adrenal insufficiency  It is very unusual that he feels perfectly well now after several days of high-dose hydrocortisone. The explanation for this is unclear, but I think it boils down to 1 of 4 things. Either he is malabsorbing hydrocortisone, he is super metabolizing hydrocortisone, he has developed cortisol resistance, or he has an undiagnosed steroid-sensitive disease. There is really no evidence for some other disease with all the other testing that we have done including multiple rheumatologic antibodies. I did order genetic gene interaction testing and am waiting for review of this from pharmacologist to see if there is any suggestion of metabolism abnormalities regarding his various hormones. The 24-hour urine cortisol should give some  sort of hint regarding absorption. In any event, the plan we have developed is to continue to slowly taper oral hydrocortisone over next several days to weeks, and I would note a tapering schedule for this depending on clinical response hopefully we will be able to maintain him not feeling well without significant adverse glucocorticoid effects. Along these lines, I will also get a baseline bone density and body composition, and I am going to check a cortisol level this morning 2 to 3 hrs after he took 50 mg of Cortef, and I am going to repeat testosterone 1 to 2 hours after he applied AndroGel. #3 Secondary hypogonadism, on replacement : Unclear why his testosterone is so different than before where we test this. It may simply be a lab error, but it is unlikely that he did not apply testosterone which he does every morning. Both he and his wife are sure that he took it. #4 Growth hormone deficiency:   Again is unclear why the same dose is resulting in the lower IGF-1, but will increase dose to 0.4 mg per day.  #5 Primary hypothyroidism, on replacement  There is likely a pituitary component to this as well, and will maintain current replacement which is keeping his free thyroxine very stable at 1.5.  #6 Cognitive dysfunction  This was thoroughly evaluated a year ago without a specific diagnosis. He feels now after several days of higher doses of hydrocortisone that his  thinking is definitely clearing. #7 Dry eyes  Appreciate the evaluation from Ophthalmology.  #8 Temperature dysmodulation  Still feels this occasionally, but this has also improved with the hydrocortisone. There is no evidence for dysautonomia. #9 Mild ascending aortic dilation and aortic insufficiency  Would recommend repeating an echo next year, which would be 2 years from original. This remains asymptomatic.  #10 Meningioma  This is unchanged on MRI scanning from 2008 and confirms a slight increase from 2017. Neurology has recommended observation.  [name] from Neurosurgery will review as well. #11 Muscle pain  This has basically resolved with the high doses of hydrocortisone. I doubt there is underlying muscle disease, and it may well be a manifestation of partial adrenal insufficiency as it has this achy typical feeling patients get in their muscles.  #12 Hyperglycemia  The fasting glucose of 157 was nonfasting, and we will continue to monitor this.  #13 Unusual cutaneous response to minimal trauma  He brings 2 photos, one of his hands and one of his foot. He bumped his knuckles likely on a treadmill, and he developed erythema on the back of his hand, which extended to the 1st interphalangeal joints of his fingers, and there was a very distinct demarcation around this area of erythema, which resolved after about 6 hours. Similarly, a pair of shoes that was a little uncomfortable that he had on for just a couple of hours; when he took his shoe off, there was demarcated erythema around the 1st metatarsophalangeal joint, and once again, this resolved. I am not sure what the nature of this is. Perhaps Rheumatology has an idea.  - 08/31/17: Pt email: I was advised by pcp to take 100 mg of hydrocortisone Q8H for three days and then double my usual dose (25-30 mg/day) until I returned to [facility]. The rescue dosage worked like Pharmacist, community. I began to feel better almost immediately and now nausea, loss of appetite, dizziness with standing, chills, cold feet, and perceived cognitive difficulties have markedly improved. I cannot remember the last time I felt this good!   - 11/23/16: eval w Endocrine Dr. Tawanna Cooler [facility]): due for reassessment of FT4 and IGF 1 in 4 weeks after changing dose on 10/27/16. Mail in specimen. Pursue recs for insomnia from psych. MRI did not show signs of dementia. PFT nl. FU 6-12 mos  - 10/24/16: eval w Endocrine Dr. Tawanna Cooler [facility] [facility]): despite adequate replacement w levothyroxine, GH and hydrocortisone he still has significant fatigue, although  there has been a definite benefit from the hormone treatment. Levothyroxine dose is a little high given the slightly low TSH and this is not uncommon to see replacement dose requirements decrease for thyroid replacement when [facility] is initiated. We are waiting on reports on IGF1 and testosterone.      . Mood disorder secondary to multiple medical problems 01/17/2017     Priority: Medium     - 12/01/16 eval w psychiatry at [facility] Dr. James Ivanoff: "complicated situation. Recommend trying remeron 15 mg to help w sleep, mood and anxiety. ... If his sleep can be improved that could potentally improve his cognition. Try it for at least 6-8 weeks. He can let me know how he is doing via patient portal. I also recommended that he try to simplify his medication and situations, meaning not using klonopin or percocet. I cautioned him on marijuana use. Recommend continuing w cognitive behavioral therapy and psychotherapy techniques.      . Amnestic MCI (mild cognitive impairment with  memory loss) 11/20/2016     Priority: Medium     - had dizziness on donepezil and remeron - Gantt'd both - as of 01/18/17 - on half tab of donepezil 10 mg  - 11/24/16: eval w Neurology Dr. Betsey Holiday [facility]): He does have insomnia, and remeron just commenced which makes good sense. He may also benefit from cholinergic stimulation and therefore donepezil may be helpful here. .. We discussed potential role of amyloid PET testing. I will arrange an amyvid PET scan. If negative, early AD is effectively ruled out. Consider brain rehab program. Meet back after PET testing.   - 10/31/16: eval w Endocrine Dr. Eyvonne Mechanic [facility]): Dr. Earney Hamburg completed neuropsychologic testing and incidates he has very focal impairment in verbal memory and to a lesser extent in visual memory and indicates this is consistent with the amnestic variety of mild cognitive impairment. Neurology is arranging for further testing with MRI for cognitive dysfunction and brain PET scan.   -  10/28/16 eval w Dr Azucena Kuba [facility] Neuropsych): impaired performance in several measures of verbal learning and memory and some marginally poor performances in measures of nonverbal learning and memory. Marland KitchenMarland KitchenMarland KitchenThese results are significant and while neuropsychological testing in and of itself cannot diagnose any particular condition you would potentially benefit from further discussion, possibly with our colleagues in Neurology to determine if there is some underlying condition that may be contributing to these memory difficulties even beyond your history of brain tumor.      . Growth hormone deficiency 03/29/2016     Priority: Medium     - Jan 2019: eval w Dr. Madie Reno [facility]) see recs under adrenal insufficiency in problem list  -- 10/31/16: eval w Endocrine Dr. Tawanna Cooler [facility] [facility]): increase GH to 0.4 mg daily  - 08/26/16:  eval w Dr. Tawanna Cooler [facility] [facility]): suspect that some of these issues will improve longer he is on [facility] .Marland KitchenMarland Kitchen Therefore I am not concerned with him taking 7.5 mg -10 mg of prednisone a day or equivalent if that is what he needs in next several months.   - 07/11/16: eval w Dr. Tawanna Cooler [place] [facility]): he did have very + response to hydrocortisone replacement  .[name] However he would lose effect after 4 hours. .. We have talked about options and could try prednisone with longer half life - swtch to t mg in AM and 1 mg between 3-5 pm and adjust as needed. He has been on finasteride for many years and give trial off of this. New rx for androgel packets. Thyroid function is excellent.   - 12/10/15: eval w Dr. Tawanna Cooler [place] [facility]): no response to [facility] after glucagon stimulation. He does have some sx that could be explained by partial adrenal insufficiency ... I would like to proceed with a trial of glucocorticoid replacement now prior to starting [facility] to see if he has typical response we would see w cortisol replacement. ... If his s do not improve w hydrocortisone in replacement doses this can be [place].  I think however we still would need to follow this given the unclear nature of the underlying pituitary process and the possibility that deficits may develop as time goes on.        . Essential hypertension 01/24/2013     Priority: Medium     - eval w Dr. Ova Freshwater (Nephor/HTN at [facility]): BP seems better, monitor home readings - not having significant autonomic dysfunction  -ECHO 10/27/16 [facility]: nl LV size and systolic  function w EF of 65% - mild AV regurgitation and mild ascending aortic dilatation - recommend repeat ECHO in 12 months  - 10/26/16: eval w Dr. Ova Freshwater (Nephro/HTN at [facility]): recommend change amlodipine to 10 mg from 5 mg and take at night. Continue ramipril 20 mg. I would hesitate to start clonidine given dry eyes and dry mouth. I would hesitate to put him on diuretic given the adrenal insufficiency. I would therefore lead toward putting him on something like low dose alpha blocker like carvedilol. ... He is agreeable to try this. We will follow up next month.   - 10/25/16: eval w Dr. Allen Norris (Cardiology): mild AV regurge based on outside echo, repeat echo. Borderline mid ascending aortic dilatation. Recommend ECHO in 12 months for FU. Lipid profile appears appropriate     . Mixed hyperlipidemia 01/24/2013     Priority: Medium     TC 189 TRI 112 HDL 81 LDL 86 09/14/16 -     Pt reports he has statin for many years (was taken off to see if this would help with fibromyalgia pain wo improvement) and zetia and tricor     . Primary hypothyroidism 01/24/2013     Priority: Medium     - TSH 09/04/17 0.09 from [facility]  - reports lab work from [facility] reportedly normal - T4 nl in Oct 2018 TSH not done  - TSH 0.436 as of 12/20/16 FT4 wnl  - 10/31/16: eval w Endocrine Dr. Tawanna Cooler [place] [facility]): FT4 mildly elevated on 200 mcg day - decrease to 150 mcg daily and recheck FT4 in 8 weeks       . Hemianopia, homonymous, left 09/15/2015     Priority: Low     - 09/04/17: eval w Dr. Jerre Simon [facility]): He has a left homonymous  hemianopia from the prior occipital lobe meningioma and resection. I will get a baseline visual field since his last exam was 2 years ago. He reports dry eye. Prior SSA and SSB were negative. Schirmer's test today showed normal tear production. The dry eye is mostly from meibomian gland dysfunction and therefore unrelated to Sjogren's. His main visual complaint is blurred vision and tunnel vision during his "spells" that he attributes to low cortisol. He is currently being treated with cortisone and has minimal symptoms. A lack of cortisone would not typically cause visual symptoms, but if his blood pressure was dropping, this could be contributing. He is scheduled for autonomic testing later this week. Addendum: 24-2 Humphrey visual field showed a small left inferior homonymous scotoma  - evaluation w Ophthalmology Dr. Ferd Glassing /24/17:  this patient continues to do exceptionally well almost 13 years following resection of a right parietooccipital falcine meningioma. Prior to surgery, he had an inferior left homonymous defect that improved following surgery and has subsequently stabilized with minimal residual.Even though he has a moderate residual lesion abutting and occluding the posterior sagittal sinus, with slight compression of the medial aspect of the left occipital lobe, his examin today remains absolutely stable with no evidence of a worsening field defect and no evidence of papilledema or optic atrophy. He has a stable nevus in the right fundus. I will see him again in 1-2 years. In meantime, he has asked for a suggestion of an endocrinologist who deals with pituitary hormone dysfunction and he also may be interested in seeing a radiation therapist for discussion of stereotactic radiosurgery using a gamma knife (as opposed to the Cyberknife). I have recommended that he discuss these  issues with [name] when he sees him tomorrow.     . Secondary male hypogonadism 01/24/2013     Priority: Low     - -  10/31/16: eval w Endocrine Dr. Tawanna Cooler [place] [facility]): testosterone level is excellent right now - continue current therapy         . IFG (impaired fasting glucose) 02/13/2018     A1C 5.9 09/04/17 with [facility] labs     . Onychomycosis 02/13/2018   . Xerostomia 12/21/2017     eval w dentist in April 2019 and recommended for tx w evoxac     . Left testicular pain 09/13/2017     09/07/17 evaluation w Urology at [facility]: ASSESSMENT / PLAN: 1 Left Testicular Pain I had the pleasure meeting with Dr. [name] in consultation today. We discussed his current symptomatology. We discussed the results of his scrotal ultrasound which does not note any concerning pathology to account for the left-sided testicular pain. He has noted improvement with the use of scrotal support. I did discuss with him consideration of a referral to our [facility] colleagues, potential PT for pelvic floor assessment or question as to whether nerve block would be appropriate. At this point in time the patient would like to defer anything invasive and is reassured with his negative ultrasound imaging.             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  There is no underlying cord signal abnormality.  There is no central canal nor is there foraminal narrowing.  C3-C4: There is a disc osteophyte complex with a superimposed posterior central disc protrusion which partially effaces the ventral CSF and indents the ventral cervical spinal cord.  There is no central canal stenosis or foraminal narrowing.  C4-C5: There is a disc osteophyte complex with prominent posterior lateral disc component within the foramina bilaterally, right greater than left.  Posterior disc bulge partially effaces the ventral CSF and indents the right ventral cervical spinal cord.  There is mild  central canal stenosis.  There is no foraminal narrowing.  C5-C6: There is disc space narrowing and disc desiccation.  There is a disc osteophyte complex with prominent uncovertebral hypertrophy, right greater than left.  Posterior disc bulge partially effaces the ventral CSF.  There is no central canal stenosis.  There is mild to moderate right-sided foraminal encroachment.  The left foramen is patent.  C6-C7: There is disc space narrowing and disc desiccation.  There is a disc osteophyte complex with right posterior lateral disc herniation partially effacing the ventral CSF and mildly flattening.  The underlying cervical spinal cord.  There is marked right-sided foraminal encroachment as well as mild left-sided foraminal narrowing.  There is facet arthropathy.  C7-T1: There is no evidence of central canal or foraminal stenosis.  Overall impression: Multilevel cervical spondylosis as described in detail above.  - evaluation w [facility] Dr. Penni Bombard Nov 2018     . History of herniated intervertebral disc 01/17/2017     2005: developed R hip and leg pain and was told he had "small central disk herniation" at L5-S1. Surgery was not recommended. He received PT and some epidural corticosteroid injections which improved but did not completely resolve the situation. Low back and leg pain recurred in 2006 and was  again tx w steroid and PT.      . Mild OSA  01/17/2017     - 10/31/16: eval w sleep medicine Dr. Ala Dach  [facility]): reviewed original records from sleep study in Sept 2017 and CPAP trial in Oct 2017 ... My opinion at this time his OSA is not sig enough to require a focused effort on using his CPAP consistently ... Particularly since he reports not having found CPAP to be helpful or restful, I think it is best to set it aside for the time being and at some point another sleep lab study and titration may be helpful. ... Depending on comorbidities this degree of OSA is worth an effort at treating but  when there has not been any consistent benefit even in the past when he tried it consistently, then it is reasonable to put on hold for the time being.      . Chronic diarrhea 01/17/2017     - eval w ID 11/21/16 Dr. [facility]): has had extensive evaluation. I cannot identify an infectious disease that is likely to cause symptomatology. As he does have some cognitive decline and chronic diarrhea I will be checking a whipple PCR on blood although I think this is highly unlikely as he does not also have joint symptoms. A duodenal aspirate would be needed to confirm or completely rule out this diagnosis. I will also be checking for heavy metals. Given the amount of fish he consumes I am expecting to have high levels organic arsenic which are not toxic. . I will also be testing for stronglyloides which can be found in [place] and [place]. I will call if any of these tests are positive.      . Recurrent sinusitis 01/17/2017     - 11/21/16: eval w ENT Dr. Jannette Spanner [facility]): For recurrent sinusitis, he is on good medical regiment w daily nasal irrigations. Surgery would be indicated for 4-5 episodes of sinusitis per year. He was not interested in operative intervention and this is reasonable. In re to his deviated septum septoplasty could be considered. He was not interested at this time. To address his nasal obstruction on a mucosal basis I recommended triple spray. Avoid afrin. Offered him fluoroscopic eval of his dysphagia but he deferred. Would not recommend surgery for OSA.      [name] Chronic, continuous use of opioids 11/09/2015     - [facility] Recommend tramadol for severe breakthrough pain.  MRI of the cervical spine ordered.  Continue at-home exercises and stretches and physical therapy.  Follow-up in 4 weeks for further evaluation  - As of May 2018 pt reports he gets his pain medication and klonazepam from [facility]  - 07/01/16: note from [facility] Dr. Toni Amend: Pt  informed I will no longer refill percocet, clonazepam, or provigil as there are no clear disease indications. Pt upset and feels he needs these meds for his CF/FMS. I advised pt that he will need to have either rheum or pain specialist provide if they feel they are indicated. Pt has shown no evidence of abuse, but also has been on meds for several years and has shown no benefit in my opinion. Pt is currently being treated for panhypopit and adrenal insuff, hypogonadism, and hypothyroidism as a result, as wellas, growth hormone deficiency. Further analysis suggests polypharmacy with potential drug interactions. The pt knows how to wean clonazepam and has asked for 30days of tramadol to wean  from the percocet. Note he is only on a half tablet of percocet qid. As a result will provide only 25mg  tabs to start qday and increase every third day to bid and tid max for 30 days total. No refills will be provided. Pt is aware.      [name] History of asthma 01/24/2013     - since childhood    - 11/23/16: eval w Dr. Renita Papa (Pulm at [facility]): bronchial asthma mild, allergic rhinitis postnasal drainage: uses albuterol BID for chest tightness. Check nitric oxide test. He has had intermittent eosinophilia and this points towards an eosinophilic asthma phenotype. He does have allergic rhinitis and think he would be better servied with our triple combination nose spray which includes mometasone, diphenhydramine and iptratroprium. I gave him rx for this.      [name] BPH (benign prostatic hyperplasia) 01/24/2013       The patient's past medical history, surgical history, medications and allergies were reviewed.    Allergies   Allergen Reactions   . Bextra [Valdecoxib] Rash       Medications  Current Outpatient Prescriptions   Medication Sig Dispense Refill   . albuterol (PROVENTIL HFA;VENTOLIN HFA) 108 (90 Base) MCG/ACT inhaler Inhale 1 puff into the lungs every 4 (four) hours as needed for Wheezing or Shortness of Breath. 18 g 6   . amLODIPine  (NORVASC) 5 MG tablet TAKE ONE TABLET BY MOUTH TWICE DAILY  180 tablet 1   . ANDROGEL 20.25 MG/1.25GM (1.62%) Gel 2.5 mg daily      1   . Calcium Carbonate-Vitamin D (CALCIUM + D PO) Take by mouth. 500/400mg   daily      . carvedilol (COREG) 6.25 MG tablet Take 2 tablets (12.5 mg total) by mouth 2 (two) times daily with meals 360 tablet 1   . clonazePAM (KLONOPIN) 0.5 MG tablet Take 0.25 mg by mouth daily as needed.      2   . Coenzyme Q10 100 MG capsule Take 100 mg by mouth daily.     [name] ezetimibe (ZETIA) 10 MG tablet TAKE ONE TABLET BY MOUTH ONE TIME DAILY  90 tablet 3   . hydrocortisone (CORTEF) 10 MG tablet Varies daily. As of 02/13/18 Take 1 every morning,1.5  tablet at 8 AM     . levothyroxine (SYNTHROID, LEVOTHROID) 150 MCG tablet Take 1 tablet daily at 6pm. 90 tablet 3   . montelukast (SINGULAIR) 10 MG tablet TAKE ONE TABLET BY MOUTH AT BEDTIME  90 tablet 2   . Multiple Vitamins-Minerals (CENTRUM SILVER PO) Take 1 tablet by mouth daily.       [name] pyridoxine (B-6) 100 MG tablet Take 100 mg by mouth daily.     . ramipril (ALTACE) 10 MG capsule TAKE TWO CAPSULES BY MOUTH DAILY  180 capsule 2   . testosterone (ANDROGEL) 25 MG/2.5GM (1%) Gel 2.5 mg daily.      1   . traMADol (ULTRAM) 50 MG tablet 1/2 tab daily - per [facility] (Patient taking differently: Take 50 mg by mouth daily as needed 1/2 tab daily - per [facility]   ) 20 tablet 0   . traMODol (ULTRAM-ER) 300 MG 24 hr tablet Take 300 mg by mouth daily      1   . VITAMIN D, CHOLECALCIFEROL, PO Take 5,000 Units by mouth daily.        . finasteride (PROSCAR) 5 MG tablet Take 1 tablet (5 mg total) by mouth daily 90 tablet 0   .  FLOVENT HFA 110 MCG/ACT inhaler Inhale 2 puffs into the lungs 2 (two) times daily.         . methylPREDNISolone sodium succinate (SOLU-MEDROL) 40 MG injection Inject 40 mg into the muscle     . metoclopramide (REGLAN) 5 MG tablet Take 1 tablet (5 mg total) by mouth 4 (four) times daily as needed (nausea) 24 tablet 0     No current facility-administered  medications for this visit.        Review of Systems  CONST: 8 lb WL since last visit, no fevers/chills sweats, fatigue at baseline, no new muscle aches  CV: No chest pain, palpitations, leg swelling  RESP: No shortness of breath, cough, worsening wheeze or asthma  GI: No persistent diarrhea, constipation, abd pain, blood in stool  MALE GU: No dysuria, + frequency, no hematuria, + nocturia,   MSK:  No new joint or muscle pain, swelling or weakness    Physical Exam  BP 144/78 (BP Site: Left arm, Patient Position: Sitting, Cuff Size: Medium)   Pulse (!) 56   Temp 97.5 F (36.4 C) (Oral)   Resp 16   Wt 82.9 kg (182 lb 12.8 oz)   SpO2 93%   BMI 26.23 kg/m   Wt Readings from Last 3 Encounters:   02/13/18 82.9 kg (182 lb 12.8 oz)   09/27/17 85.9 kg (189 lb 6.4 oz)   09/13/17 86.4 kg (190 lb 6.4 oz)     GEN: Well developed well nourished male alert and appropriate in no apparent distress  HENT: Tympanic membrane nl bilat, no nasal congestion, oropharynx normal w no exudate or erythema  EYES: PERRL,  extra ocular movements intact, no pallor or scleral icterus, normal conjunctiva  NECK: supple, no thyromegaly or nodules appreciated  LYMPH: no cervical or supraclavicular lymphadenopathy appreciated  CV: regular rate and rhythm no m/r/g.  No lower extremity edema.  2+ radial pulses present and equal.  RESP: clear to auscultation bilaterally, normal effort, no rhonchi or rales  SKIN: onychomycosis of toenails, concern about ? SK on scalp pending eval w derm  MSK: Normal bulk and tone, normal strength 5/5 and sensation UE / LE  NEURO: PERRLA, extra ocular movements intact, face symmetric, hearing intact to voice, palate raise, shoulder shrug and neck flexion intact, tongue midline. Moves all extremities.   PSYCH: normal mood and affect    Assessment/Plan    1. Primary hypothyroidism  Recheck TSH and FT4 to confirm appropriate dosing    2. Basal cell carcinoma (BCC) of skin of nose  Pending additional MOHS and derm  evaluation    3. IFG (impaired fasting glucose)  Monitor A1C     4. Secondary adrenal insufficiency  Continue care with [facility] - pt has not yet established with endocrine locally and has been given names of potential options via Dr. Tamera Punt    5. Benign prostatic hyperplasia with urinary frequency  Refill of medication was granted.   - finasteride (PROSCAR) 5 MG tablet; Take 1 tablet (5 mg total) by mouth daily  Dispense: 90 tablet; Refill: 0    6. Nausea  Refill of medication was granted. SE reviewed.   - metoclopramide (REGLAN) 5 MG tablet; Take 1 tablet (5 mg total) by mouth 4 (four) times daily as needed (nausea)  Dispense: 24 tablet; Refill: 0    7. Onychomycosis  Will check LFT - cautioned on risk to liver - advised no treatment for this    8. Essential hypertension  Mildly elevated  today - did not take meds yet - monitor - recommended he limit use of NSAIDS and use tylenol for pain instead.     Risks & benefits of the new medication(s) were explained to the patient, who appeared to understand and agrees to the treatment plan.    Gloriajean Dell, MD  [facility]  [address]  [place], [place] [address]  P) [phone]  F) [phone]  [email or web address]

## 2018-02-14 ENCOUNTER — Encounter: Payer: Self-pay | Admitting: Internal Medicine

## 2018-02-14 LAB — HEPATIC FUNCTION PANEL
ALT: 49 IU/L — ABNORMAL HIGH (ref 0–44)
AST (SGOT): 31 IU/L (ref 0–40)
Albumin: 4.5 g/dL (ref 3.6–4.8)
Alkaline Phosphatase: 54 IU/L (ref 39–117)
Bilirubin Direct: 0.21 mg/dL (ref 0.00–0.40)
Bilirubin, Total: 1.1 mg/dL (ref 0.0–1.2)
Protein, Total: 6.7 g/dL (ref 6.0–8.5)

## 2018-02-14 LAB — LIPID PANEL
Cholesterol / HDL Ratio: 3.3 ratio (ref 0.0–5.0)
Cholesterol: 177 mg/dL (ref 100–199)
HDL: 54 mg/dL (ref >39)
LDL Calculated: 76 mg/dL (ref 0–99)
Triglycerides: 237 mg/dL — ABNORMAL HIGH (ref 0–149)
VLDL Calculated: 47 mg/dL — ABNORMAL HIGH (ref 5–40)

## 2018-02-14 LAB — BASIC METABOLIC PANEL
BUN / Creatinine Ratio: 18 (ref 10–24)
BUN: 21 mg/dL (ref 8–27)
CO2: 24 mmol/L (ref 20–29)
Calcium: 9.4 mg/dL (ref 8.6–10.2)
Chloride: 104 mmol/L (ref 96–106)
Creatinine: 1.16 mg/dL (ref 0.76–1.27)
EGFR: 64 mL/min/{1.73_m2} (ref >59)
EGFR: 74 mL/min/{1.73_m2} (ref >59)
Glucose: 104 mg/dL — ABNORMAL HIGH (ref 65–99)
Potassium: 4.6 mmol/L (ref 3.5–5.2)
Sodium: 143 mmol/L (ref 134–144)

## 2018-02-14 LAB — URINALYSIS REFLEX TO MICROSCOPIC EXAM - REFLEX TO CULTURE
Bilirubin, UA: NEGATIVE
Blood, UA: NEGATIVE
Glucose, UA: NEGATIVE
Ketones UA: NEGATIVE
Leukocyte Esterase, UA: NEGATIVE
Nitrite, UA: NEGATIVE
Protein, UA: NEGATIVE
Urine Specific Gravity: 1.019 (ref 1.005–1.030)
Urobilinogen, Ur: 0.2 mg/dL (ref 0.2–1.0)
pH, UA: 6.5 (ref 5.0–7.5)

## 2018-02-14 LAB — CBC
Hematocrit: 44.4 % (ref 37.5–51.0)
Hemoglobin: 14.8 g/dL (ref 13.0–17.7)
MCH: 30.1 pg (ref 26.6–33.0)
MCHC: 33.3 g/dL (ref 31.5–35.7)
MCV: 90 fL (ref 79–97)
Platelets: 223 10*3/uL (ref 150–450)
RBC: 4.92 x10E6/uL (ref 4.14–5.80)
RDW: 13.4 % (ref 12.3–15.4)
WBC: 9.4 10*3/uL (ref 3.4–10.8)

## 2018-02-14 LAB — HEMOGLOBIN A1C: Hemoglobin A1C: 6.2 % — ABNORMAL HIGH (ref 4.8–5.6)

## 2018-02-14 LAB — REFLEX - MICROSCOPIC EXAMINATION
Bacteria, UA: NONE SEEN
Casts, UA: NONE SEEN /lpf
RBC, UA: NONE SEEN /hpf (ref 0–?)

## 2018-02-14 LAB — T4, FREE: T4, Free: 1.76 ng/dL (ref 0.82–1.77)

## 2018-02-14 LAB — PSA: Prostate Specific Antigen, Total: 0.4 ng/mL (ref 0.0–4.0)

## 2018-02-14 LAB — TSH: TSH: 0.089 u[IU]/mL — ABNORMAL LOW (ref 0.450–4.500)

## 2018-02-14 LAB — TESTOSTERONE: Testosterone: 459 ng/dL (ref 264–916)

## 2018-02-15 ENCOUNTER — Encounter: Payer: Self-pay | Admitting: Internal Medicine

## 2018-02-27 ENCOUNTER — Encounter (INDEPENDENT_AMBULATORY_CARE_PROVIDER_SITE_OTHER): Payer: Self-pay

## 2018-03-09 NOTE — Progress Notes (Signed)
Signed [facility] ICD 10 Code form completed and faxed to [facility]. Also scanned into chart.

## 2018-03-10 ENCOUNTER — Encounter: Payer: Self-pay | Admitting: Internal Medicine

## 2018-03-20 ENCOUNTER — Other Ambulatory Visit: Payer: Self-pay | Admitting: Internal Medicine

## 2018-03-20 ENCOUNTER — Encounter: Payer: Self-pay | Admitting: Internal Medicine

## 2018-03-20 DIAGNOSIS — E291 Testicular hypofunction: Secondary | ICD-10-CM

## 2018-03-22 ENCOUNTER — Telehealth: Payer: Self-pay | Admitting: Internal Medicine

## 2018-03-22 NOTE — Telephone Encounter (Signed)
Called the patient regarding his having nausea.  Made an appointment for him for 03/23/2018 at 0115 pm.

## 2018-03-22 NOTE — Telephone Encounter (Signed)
Pt called and left message (on message line) that he has nausea and would like to see Dr. Orlin Hilding today.  Please call him back.  [phone]

## 2018-03-23 ENCOUNTER — Ambulatory Visit (INDEPENDENT_AMBULATORY_CARE_PROVIDER_SITE_OTHER): Payer: Medicare Other | Admitting: Internal Medicine

## 2018-03-23 ENCOUNTER — Encounter: Payer: Self-pay | Admitting: Internal Medicine

## 2018-03-23 VITALS — BP 125/92 | HR 81 | Temp 98.2°F | Resp 16 | Wt 176.6 lb

## 2018-03-23 DIAGNOSIS — R110 Nausea: Secondary | ICD-10-CM

## 2018-03-23 DIAGNOSIS — E291 Testicular hypofunction: Secondary | ICD-10-CM

## 2018-03-23 DIAGNOSIS — E039 Hypothyroidism, unspecified: Secondary | ICD-10-CM

## 2018-03-23 DIAGNOSIS — E2749 Other adrenocortical insufficiency: Secondary | ICD-10-CM

## 2018-03-23 DIAGNOSIS — R5383 Other fatigue: Secondary | ICD-10-CM

## 2018-03-23 DIAGNOSIS — R350 Frequency of micturition: Secondary | ICD-10-CM

## 2018-03-23 DIAGNOSIS — Z8739 Personal history of other diseases of the musculoskeletal system and connective tissue: Secondary | ICD-10-CM

## 2018-03-23 NOTE — Progress Notes (Signed)
Labs drawn from patient's right [facility] [facility] without problems.  Patient tolerated procedure well.  Blood labs and urine sent to [facility].

## 2018-03-23 NOTE — Progress Notes (Signed)
Chief Complaint   Patient presents with   . 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 He is here with his wife today.  He has a complicated medical history as summarized below.     He was in his usual state of health until the last 3-4 days when he is felt particularly ill.  He feels like he is having another one of his "episodes" where he gets extreme fatigue, insomnia, malaise, abdominal discomfort and nausea.  In the past this has been treated from his previous primary care physician with high doses of steroids with improvement.  Currently at baseline he is taking 20-25 mg of Cortef in the morning.  He denies any sick contacts.  He denies any travel.  He has persistent nausea without vomiting.  He has no fevers or chills or sweats.  He has prominent fatigue and is had difficulty leaving the house.  He has lost 5 pounds.  He notes some slight Nedra Hai more increased frequency of urination and this has made his sleep more difficult.  He has a sensation of coldness in his feet.     In recent months.  We have tried to arrange endocrine subspecialty evaluation for him - he was seen by Dr. Tamera Punt at [facility] who recommended he try to establish care with an endocrinologist with other specialized experience.  The patient has been seen in the past at [facility] last in 2017 by Dr. Newman Pies and recommended to see Dr. Fayrene Helper but it does not appear this subsequent appointment was made.  He has been seen extensively at the [facility] in the past. His history of this is summarized below.     Active Problems  Patient Active Problem List    Diagnosis Date Noted   . History of meningioma of the brain 11/09/2015     Priority: High     12/09/02: s/p R parieto-occipital parasagittal craniotomy for 85-90% resection of R pareito-occipital falx meningioma that originally measured 61 x 24 mm    - 09/06/17: electronic eval w [name]  (neurosurg-[facility]): Dr. Rosanne Ashing residual occipital parasagittal meningioma appears stable between 2017 and 2019 though it enlarged a little between 2008 and 2017. It is now calcifying, suggesting that further growth is unlikely. I do not think it is responsible for any ongoing symptoms. I would suggest a follow up MRI in 2 years.  - 09/05/17: eval w Dr. Vira Blanco (Neuro- [facility]): I reviewed the MRI scan of the head which shows a posterior fall seen and parafalcine meningioma which is minimally enlarged compared to January 2017 and not significantly changed since April 2018. 1. Parasagittal meningioma which is essentially stable and calcified on CT scan. Dr.Parney advised against reoperation if stable; it is more stable than unstable but at the request of Dr. [name] , I will request an E consultation from [name]. 2. I am unsure about the episodes of acute dysphoria, fatigue, nausea and other symptoms. A favorable response to steroids may suggest adrenal insufficiency especially in the context of his already known pituitary problems. However, corticosteroids may have favorable effects in many situations. He is scheduled to see Dr. Madie Reno in endocrinology again and I look forward to his opinion in that regard. 3. I am unsure of the cause of his chronic pain. He really does not have any myofascial trigger points  that would be in keeping with fibromyalgia and would be very unusual with severe chronic fibromyalgia not to have demonstrable trigger points. This problem has been going on for quite a long time, however. 4. I reviewed his autonomic reflex studies which do not provide convincing evidence for dysautonomia. As noted, he is able to get erections and ejaculate, which is reassuring about his autonomic function.   - 10/27/16: eval w [name] (Neurology - [facility]): agree w [name] that lesion is stable and that calcification should be reassuring . I recommended he remain under imaging surveillance.  I have told him that it is very unlikely that there is a dysautonomia to account for his sx when upright, but I personally like to be sure that we have no evidence for that. He will be seen in Sleep Medicine and I endorse that as well.   - 07/04/16: eval w  [name] (Neurosurgery -[facility]): stable both clinically and on imaging w regard to his now calcified residual parasagittal meningioma. ... Now that it is calcified, I am not convinced there is any change between Jan and Oct of this year. I think continued observation would be very reasonable. Recommend FU MRI in one year.   - MRI Brain w/wo contrast 06/07/16: Again seen is a L posterior parafalcine meningioma. There is a slightly greater measurement in the cephalocaudal plane which may be related to technique or minimal tumor progression. Progressive opacification of the L mastoid air cells. Otherwise stable exam  - 10/13/16: letter from [name]: Neurosurgery at [facility]: "CT scan does show dents of calcification.Marland KitchenMarland KitchenAs we discussed in clinic I think it is unlikely that ongoing sx are related to meningioma .[name] I would continue to follow w MRI scan in one year.   - 10/07/15: eval w [name], Neurosurgery at [facility]: "number of nonspecific sx. I am not certain that they are related to any enlargement of his residual meningioma and certainly would not expect them to be improved by any treatment of his residual meningioma. With that in minds I think the question to establish is whether or not this residual is in fact enlarging. ... I suspect that this lesion is stable and in fact calcified. If that were the case, observation with another MRI scan in a year may be very reasonable. Alternatively we could consider either gamma knife radiosurgery or craniotomy and resection. The lesion is at the UL for size for what we could treat w gamma knife, but I think it could be accomplished in one or perhaps two staged procedures. However I would only recommend this if the  lesion was clearly enlarging and I am not certain of this at the present time. Recommend CT scan of head to confirm that lesion is calcified.        . Chronic fatigue fibromyalgia syndrome 01/24/2013     Priority: High     In Aug 2006 hd noted spontaneous onset of severe fatigue and myalgias. He had extensive evaluation for CT and autoimmune disease at [facility] . Ultimately he was discovered to have inadequate thyroid replacement as well as a low free testosterone. He was evaluated by endocrine at that time. ... Ultimately he was diagnosed with fibromyalgia and chronic fatigue syndrome for which he was prescribed various medications including provigil, cymbalta, gabapentin and pregabalin, and elavil (all w/o improvement).     - 09/08/17: evaluation with Rheum ([facility]): A/P #1 Pituitary insufficiency  #2 Secondary adrenal insufficiency  #  3 Myalgias  #4 Sicca symptoms: My suspicion is that pt has an underlying rheumatologic process that accounts for his steroid responsive symptoms here is exceedingly low. The constellation of symptoms that he describes during a crisis is not typical for any specific autoimmune disease such as lupus, rheumatoid arthritis, vasculitis, or inflammatory myopathy. In particular, inflammatory myopathy would typically present with painless muscle weakness, and his muscle strength is preserved, and he is endorsing more myalgia, more than anything else. I reviewed pictures he brought in with him for episodes in his hands and feet, and I am at a bit of a loss as to what this is. It does not look like CRPS, erythemalgia, or Raynaud's. I do note he has had extensive serologic testing here so far including ANA, ENAs, complements, all of which are within normal limits as are inflammatory markers. To complete serologic evaluation, I think we can check cryoglobulins, ANCAs, and a CCP antibody, and given back pain, which seems more myofascial than intra-articular or muscular, would  recommend just obtaining plain x-rays of spine as well as feet and ankles. He is on board with this. I will review the results with him on the portal.  - 06/22/17: He carries a dx from previous providers of chronic pain and fibromyalgia syndrome.  He has been extensively evaluated in past for this and other conditions by [facility].  He has another annual exam with [facility] coming up in next 5 mos.  He notes that he has had more difficulty with fatigue, generalized muscle pain and "brain fog."  He has submitted routine blood work to [facility] to monitor adrenal insufficiency and hypothyroidism and these are stable.  He feels "terrible" and has difficulty leaving house due to pain and fatigue.  He has tried a variety of meds in past to help with fibromyalgia wo specific improvement.  These are summarized below.  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  Currently he takes tramadol 25-50 mg once or twice daily and this helps but does not entirely alleviate pain.  Pain is worse in morning, improves somewhat through day.  Fatigue is always present.  He was recommended by psychiatrist and neurologist at [facility] to try Remeron and donepezil.  He did not tolerate these.  He wonders about additional treatment. Currently he feels like insomnia is better but fatigue and pain are worse. Despite extensive workup with many specialists in past at [facility] he wonders if there is some occult cause of symptoms which has not been identified.  - 10/08/15:  note from [name] [facility] Fibromyalgia and Chronic Fatigue): presentation is c/w fibromyalgia and chronic fatigue. Advised to complete Fibro/CFS treatment program  - 10/06/15 Note from Neurology Dr. Raynelle Fanning Hammack: In Aug 2006 noted spontaneous onset of severe fatigue and myalgias. He had extensive evaluation for CT and autoimmune disease. Ultimately he was  discovered to have inadequate thyroid replacement as well as low free testosterone. He was eval by endocrine  ... Ultimately he was diagnosed with fibromyalgia and chronic fatigue syndrome for which he was prescribed various medications including provigil, cymbalta, gabapentin and pregabalin.      . Basal cell carcinoma (BCC) of skin of nose 01/29/2018     Priority: Medium     Dx in June 2019 recommended for MOHS in July 2019     - eval w Dr.  Reem Tadros 01/02/18: s/p biopsy L nasal area     . History of insomnia 01/17/2017     Priority: Medium     - 10/31/16: eval w Endocrine Dr. Tawanna Cooler [facility]): I believe this is a major contributor - we did have him do overnight oximetry with using CPAP and this did show continued desaturations that were very long periods of time when he was up and not able to fall asleep - he is seeing the sleep center      . Secondary adrenal insufficiency 01/17/2017     Priority: Medium     - 09/29/17 Dr. Tamera Punt endo: Hypopituitarism in setting of brain surgery for meningioma. Patient is currently on [facility] for (AI), levothyroxine, testosterone and GH replacement. The question as I discussed with him is whether he is using HC to treat non specific symptoms or what he refers to as "quality of life" or whether he does need high doses due to some issue with HC metabolism. I explained to him that symptoms he experiences are not very specific for adrenal disease and there is concern about chronic over exposure to corticosteroid therapy, with which he is quite familiar. I encouraged him to continue to slowly taper dose of HC to a more physiologic dose, in range of 20 mg in am and 10 mg in pm. One can also use a dose that is based on his body surface area. In case of suspicion of "adrenal crisis", I suggested he uses one injection of Solucortef rather than taking high oral doses of HC for several days. I also recommended second opinion with Dr. Leonides Grills, Chief, Section of Endocrinology at  [facility], in order to discuss adrenal issue. I'm happy to continue to see him here at [facility]. I also explained to him that have not prescribed growth hormone replacement for adults so he will continue to follow with the [facility] clinic on that. He can return in few months or as needed.  - 09/06/17: eval w Endocrine Dr. Eyvonne Mechan[facility]): Although after we diagnosed secondary adrenal insufficiency, growth hormone deficiency, hypogonadotropic hypogonadism, and primary hypothyroidism associated with a partially empty sella on imaging, and he had an initial positive response to replacement as time has gone on, he really has not done well and has had several episodes where he gets a virus where he gets very ill with muscle aches like he always has the flu and has nausea, vomiting, and lightheadedness and in general on physiologic hydrocortisone, he awakes in morning with discomfort, particularly in upper back area, and after taking his medications it will be several hours until he has enough energy to get up and do just minor tasks. Around [place], he was ill with what sounds like a viral illness for 3 days and basically had a crisis. He was treated with 100 mg of hydrocortisone orally every 8 hours, and all of his symptoms resolved. He subsequently tapered down on oral hydrocortisone and in last 3 or 4 days has been taking between 130 and 110 mg total per day and continues to feel well as he is slowly tapering this down. We are collecting a 24-hour urine free cortisol on this dose. His other hormone levels are interesting as well. He is on GH at 0.3 mg a day. In past, this had IGF-1's at the upper end of normal range to mildly elevated, now Z-score is just +1.28 and despite taking AndroGel cream on a daily basis, his testosterone level was  only 19, whereas in past this was normal. He is sure that he had not forgotten testosterone at that time. His thyroid tests are similar to what they have  been in past with a normal free T4 of 1.5 and TSH mildly suppressed. The current ACTH is less than 1. Other issues will be discussed below. A/P  #1 Empty sella with pituitary insufficiency  #2 Secondary adrenal insufficiency  It is very unusual that he feels perfectly well now after several days of high-dose hydrocortisone. The explanation for this is unclear, but I think it boils down to 1 of 4 things. Either he is malabsorbing hydrocortisone, he is super metabolizing hydrocortisone, he has developed cortisol resistance, or he has an undiagnosed steroid-sensitive disease. There is really no evidence for some other disease with all the other testing that we have done including multiple rheumatologic antibodies. I did order genetic gene interaction testing and am waiting for review of this from pharmacologist to see if there is any suggestion of metabolism abnormalities regarding his various hormones. The 24-hour urine cortisol should give some sort of hint regarding absorption. In any event, the plan we have developed is to continue to slowly taper oral hydrocortisone over next several days to weeks, and I would note a tapering schedule for this depending on clinical response hopefully we will be able to maintain him not feeling well without significant adverse glucocorticoid effects. Along these lines, I will also get a baseline bone density and body composition, and I am going to check a cortisol level this morning 2 to 3 hrs after he took 50 mg of Cortef, and I am going to repeat testosterone 1 to 2 hours after he applied AndroGel. #3 Secondary hypogonadism, on replacement : Unclear why his testosterone is so different than before where we test this. It may simply be a lab error, but it is unlikely that he did not apply testosterone which he does every morning. Both he and his wife are sure that he took it. #4 Growth hormone deficiency:   Again is unclear why the same dose is resulting in the lower IGF-1, but  will increase dose to 0.4 mg per day.  #5 Primary hypothyroidism, on replacement  There is likely a pituitary component to this as well, and will maintain current replacement which is keeping his free thyroxine very stable at 1.5.  #6 Cognitive dysfunction  This was thoroughly evaluated a year ago without a specific diagnosis. He feels now after several days of higher doses of hydrocortisone that his thinking is definitely clearing. #7 Dry eyes  Appreciate the evaluation from Ophthalmology.  #8 Temperature dysmodulation  Still feels this occasionally, but this has also improved with the hydrocortisone. There is no evidence for dysautonomia. #9 Mild ascending aortic dilation and aortic insufficiency  Would recommend repeating an echo next year, which would be 2 years from original. This remains asymptomatic.  #10 Meningioma  This is unchanged on MRI scanning from 2008 and confirms a slight increase from 2017. Neurology has recommended observation. [name] from Neurosurgery will review as well. #11 Muscle pain  This has basically resolved with the high doses of hydrocortisone. I doubt there is underlying muscle disease, and it may well be a manifestation of partial adrenal insufficiency as it has this achy typical feeling patients get in their muscles.  #12 Hyperglycemia  The fasting glucose of 157 was nonfasting, and we will continue to monitor this.  #13 Unusual cutaneous response to minimal trauma  He brings  2 photos, one of his hands and one of his foot. He bumped his knuckles likely on a treadmill, and he developed erythema on the back of his hand, which extended to the 1st interphalangeal joints of his fingers, and there was a very distinct demarcation around this area of erythema, which resolved after about 6 hours. Similarly, a pair of shoes that was a little uncomfortable that he had on for just a couple of hours; when he took his shoe off, there was demarcated erythema around the 1st metatarsophalangeal  joint, and once again, this resolved. I am not sure what the nature of this is. Perhaps Rheumatology has an idea.  - 08/31/17: Pt email: I was advised by pcp to take 100 mg of hydrocortisone Q8H for three days and then double my usual dose (25-30 mg/day) until I returned to [facility]. The rescue dosage worked like Pharmacist, community. I began to feel better almost immediately and now nausea, loss of appetite, dizziness with standing, chills, cold feet, and perceived cognitive difficulties have markedly improved. I cannot remember the last time I felt this good!   - 11/23/16: eval w Endocrine Dr. Tawanna Cooler [facility]): due for reassessment of FT4 and IGF 1 in 4 weeks after changing dose on 10/27/16. Mail in specimen. Pursue recs for insomnia from psych. MRI did not show signs of dementia. PFT nl. FU 6-12 mos  - 10/24/16: eval w Endocrine Dr. Tawanna Cooler [facility]): despite adequate replacement w levothyroxine, GH and hydrocortisone he still has significant fatigue, although there has been a definite benefit from the hormone treatment. Levothyroxine dose is a little high given the slightly low TSH and this is not uncommon to see replacement dose requirements decrease for thyroid replacement when [facility] is initiated. We are waiting on reports on IGF1 and testosterone.      . Mood disorder secondary to multiple medical problems 01/17/2017     Priority: Medium     - 12/01/16 eval w psychiatry at [facility] Dr. James Ivanoff: "complicated situation. Recommend trying remeron 15 mg to help w sleep, mood and anxiety. ... If his sleep can be improved that could potentally improve his cognition. Try it for at least 6-8 weeks. He can let me know how he is doing via patient portal. I also recommended that he try to simplify his medication and situations, meaning not using klonopin or percocet. I cautioned him on marijuana use. Recommend continuing w cognitive behavioral therapy and psychotherapy techniques.      . Amnestic MCI (mild cognitive impairment with memory loss)  11/20/2016     Priority: Medium     - had dizziness on donepezil and remeron - Allen'd both - as of 01/18/17 - on half tab of donepezil 10 mg    - 11/24/16: eval w Neurology Dr. Betsey Holiday [facility]): He does have insomnia, and remeron just commenced which makes good sense. He may also benefit from cholinergic stimulation and therefore donepezil may be helpful here. .. We discussed potential role of amyloid PET testing. I will arrange an amyvid PET scan. If negative, early AD is effectively ruled out. Consider brain rehab program. Meet back after PET testing.   - 10/31/16: eval w Endocrine Dr. Eyvonne Mechanic [facility]): Dr. Earney Hamburg completed neuropsychologic testing and incidates he has very focal impairment in verbal memory and to a lesser extent in visual memory and indicates this is consistent with the amnestic variety of mild cognitive impairment. Neurology is arranging for further testing with MRI for cognitive dysfunction and brain  PET scan.   - 10/28/16 eval w Dr Azucena [facility] Neuropsych): impaired performance in several measures of verbal learning and memory and some marginally poor performances in measures of nonverbal learning and memory. Marland KitchenMarland KitchenMarland KitchenThese results are significant and while neuropsychological testing in and of itself cannot diagnose any particular condition you would potentially benefit from further discussion, possibly with our colleagues in Neurology to determine if there is some underlying condition that may be contributing to these memory difficulties even beyond your history of brain tumor.      . Growth hormone deficiency 03/29/2016     Priority: Medium     - Jan 2019: eval w Dr. Madie Reno [facility]) see recs under adrenal insufficiency in problem list  -- 10/31/16: eval w Endocrine Dr. Tawanna Cooler [facility]): increase GH to 0.4 mg daily  - 08/26/16:  eval w Dr. Tawanna Cooler [facility]): suspect that some of these issues will improve longer he is on [facility] .Marland KitchenMarland Kitchen Therefore I am not concerned with him taking 7.5 mg -10  mg of prednisone a day or equivalent if that is what he needs in next several months.   - 07/11/16: eval w Dr. Tawanna Cooler [place] [facility]): he did have very + response to hydrocortisone replacement  .[name] However he would lose effect after 4 hours. .. We have talked about options and could try prednisone with longer half life - swtch to t mg in AM and 1 mg between 3-5 pm and adjust as needed. He has been on finasteride for many years and give trial off of this. New rx for androgel packets. Thyroid function is excellent.   - 12/10/15: eval w Dr. Tawanna Cooler [place] [facility] Endocrine): no response to [facility] after glucagon stimulation. He does have some sx that could be explained by partial adrenal insufficiency ... I would like to proceed with a trial of glucocorticoid replacement now prior to starting [facility] to see if he has typical response we would see w cortisol replacement. ... If his s do not improve w hydrocortisone in replacement doses this can be [place]. I think however we still would need to follow this given the unclear nature of the underlying pituitary process and the possibility that deficits may develop as time goes on.        . Essential hypertension 01/24/2013     Priority: Medium     Treated with amlodipine and ramipril and coreg    - eval w Dr. Ova Freshwater (Nephor/HTN at [facility]): BP seems better, monitor home readings - not having significant autonomic dysfunction  -ECHO 10/27/16 [facility]: nl LV size and systolic function w EF of 65% - mild AV regurgitation and mild ascending aortic dilatation - recommend repeat ECHO in 12 months  - 10/26/16: eval w Dr. Ova Freshwater (Nephro[facility]HTN at [facility]): recommend change amlodipine to 10 mg from 5 mg and take at night. Continue ramipril 20 mg. I would hesitate to start clonidine given dry eyes and dry mouth. I would hesitate to put him on diuretic given the adrenal insufficiency. I would therefore lead toward putting him on something like low dose alpha blocker like carvedilol. ... He is  agreeable to try this. We will follow up next month.   - 10/25/16: eval w Dr. Allen Norris (Cardiology): mild AV regurge based on outside echo, repeat echo. Borderline mid ascending aortic dilatation. Recommend ECHO in 12 months for FU. Lipid profile appears appropriate     . Mixed hyperlipidemia 01/24/2013     Priority: Medium  Pt reports he has taken statin for many years (was taken off to see if this would help with fibromyalgia pain / wo improvement) and zetia and tricor. As of June 2019 not on tricor (concern with zetia and tricor contributing to risk of myopathy)    TC 177 TRI 237 HDL 54 LDL 76 02/14/18  TC 161 TRI 112 HDL 81 LDL 86 09/14/16 -          . Primary hypothyroidism 01/24/2013     Priority: Medium     - 03/12/18: email from his endocrinologist Dr. Madie Reno at [facility]: " You are correct regarding the thyroid replacement dose. The TSH is not reliable. All you really need in addition is free T4 which he states was normal.. You values here in the past have been around 1.5. If it is still in this range I would not change the levothyroxine dose. Regarding testosterone, it would be good to get free testosterone or bioavailable testosterone along with total to be sure dose is correct. Regarding growth hormone, if you have only been off of it for a week or two, you can start back on same dose. If it has been longer you should probably start at 0.2 mg/day for 2 weeks then increase to 0.3 mg.   - 02/14/18: TSH 0.089 FT4 wnl    - TSH 09/04/17 0.09 from [facility]  - reports lab work from [facility] [employer] reportedly normal - T4 nl in Oct 2018 TSH not done  - TSH 0.436 as of 12/20/16 FT4 wnl  - 10/31/16: eval w Endocrine Dr. Tawanna Cooler [facility]): FT4 mildly elevated on 200 mcg day - decrease to 150 mcg daily and recheck FT4 in 8 weeks       . Hemianopia, homonymous, left 09/15/2015     Priority: Low     - 09/04/17: eval w Dr. Jerre Simon [facility]): He has a left homonymous hemianopia from the prior occipital lobe meningioma and resection. I will  get a baseline visual field since his last exam was 2 years ago. He reports dry eye. Prior SSA and SSB were negative. Schirmer's test today showed normal tear production. The dry eye is mostly from meibomian gland dysfunction and therefore unrelated to Sjogren's. His main visual complaint is blurred vision and tunnel vision during his "spells" that he attributes to low cortisol. He is currently being treated with cortisone and has minimal symptoms. A lack of cortisone would not typically cause visual symptoms, but if his blood pressure was dropping, this could be contributing. He is scheduled for autonomic testing later this week. Addendum: 24-2 Humphrey visual field showed a small left inferior homonymous scotoma  - evaluation w Ophthalmology Dr. Ferd Glassing /24/17:  this patient continues to do exceptionally well almost 13 years following resection of a right parietooccipital falcine meningioma. Prior to surgery, he had an inferior left homonymous defect that improved following surgery and has subsequently stabilized with minimal residual.Even though he has a moderate residual lesion abutting and occluding the posterior sagittal sinus, with slight compression of the medial aspect of the left occipital lobe, his examin today remains absolutely stable with no evidence of a worsening field defect and no evidence of papilledema or optic atrophy. He has a stable nevus in the right fundus. I will see him again in 1-2 years. In meantime, he has asked for a suggestion of an endocrinologist who deals with pituitary hormone dysfunction and he also may be interested in seeing a radiation therapist for discussion of stereotactic radiosurgery  using a gamma knife (as opposed to the Cyberknife). I have recommended that he discuss these issues with [name] when he sees him tomorrow.     . Secondary male hypogonadism 01/24/2013     Priority: Low     - - 10/31/16: eval w Endocrine Dr. Tawanna Cooler [facility]): testosterone level is  excellent right now - continue current therapy         . Elevated ALT measurement 02/14/2018     02/14/18: ALT 49   02/2017 nl LFT     . IFG (impaired fasting glucose) 02/13/2018     - A1C 6.2 02/14/18 FBG 104  - A1C 5.9 09/04/17 with [facility] labs     . Onychomycosis 02/13/2018   . Xerostomia 12/21/2017     eval w dentist in April 2019 and recommended for tx w evoxac     . Left testicular pain 09/13/2017     09/07/17 evaluation w Urology at [facility]: ASSESSMENT / PLAN: 1 Left Testicular Pain I had the pleasure meeting with Dr. [name] in consultation today. We discussed his current symptomatology. We discussed the results of his scrotal ultrasound which does not note any concerning pathology to account for the left-sided testicular pain. He has noted improvement with the use of scrotal support. I did discuss with him consideration of a referral to our [facility] colleagues, potential PT for pelvic floor assessment or question as to whether nerve block would be appropriate. At this point in time the patient would like to defer anything invasive and is reassured with his negative ultrasound imaging.             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  There is no underlying cord signal abnormality.  There is no central canal nor is there foraminal narrowing.  C3-C4: There is a disc osteophyte complex with a superimposed posterior central disc protrusion which partially effaces the ventral CSF and indents the ventral cervical spinal cord.  There is no central canal stenosis or foraminal narrowing.  C4-C5: There is a disc osteophyte complex with prominent posterior lateral disc component within the foramina bilaterally, right greater than left.  Posterior disc bulge partially effaces the ventral CSF and indents the right ventral  cervical spinal cord.  There is mild central canal stenosis.  There is no foraminal narrowing.  C5-C6: There is disc space narrowing and disc desiccation.  There is a disc osteophyte complex with prominent uncovertebral hypertrophy, right greater than left.  Posterior disc bulge partially effaces the ventral CSF.  There is no central canal stenosis.  There is mild to moderate right-sided foraminal encroachment.  The left foramen is patent.  C6-C7: There is disc space narrowing and disc desiccation.  There is a disc osteophyte complex with right posterior lateral disc herniation partially effacing the ventral CSF and mildly flattening.  The underlying cervical spinal cord.  There is marked right-sided foraminal encroachment as well as mild left-sided foraminal narrowing.  There is facet arthropathy.  C7-T1: There is no evidence of central canal or foraminal stenosis.  Overall impression: Multilevel cervical spondylosis as described in detail above.  - evaluation [facility] Dr. Penni Bombard Nov 2018     . Mild OSA  01/17/2017     - 10/31/16: eval w sleep medicine  Dr. Ala Dach  [facility]): reviewed original records from sleep study in Sept 2017 and CPAP trial in Oct 2017 ... My opinion at this time his OSA is not sig enough to require a focused effort on using his CPAP consistently ... Particularly since he reports not having found CPAP to be helpful or restful, I think it is best to set it aside for the time being and at some point another sleep lab study and titration may be helpful. ... Depending on comorbidities this degree of OSA is worth an effort at treating but when there has not been any consistent benefit even in the past when he tried it consistently, then it is reasonable to put on hold for the time being.      . Chronic diarrhea 01/17/2017     - eval w ID 11/21/16 Dr. Marcy Panning [facility]): has had extensive evaluation. I cannot identify an infectious disease that is likely to cause symptomatology.  As he does have some cognitive decline and chronic diarrhea I will be checking a whipple PCR on blood although I think this is highly unlikely as he does not also have joint symptoms. A duodenal aspirate would be needed to confirm or completely rule out this diagnosis. I will also be checking for heavy metals. Given the amount of fish he consumes I am expecting to have high levels organic arsenic which are not toxic. . I will also be testing for stronglyloides which can be found in [place] and [place]. I will call if any of these tests are positive.      . Recurrent sinusitis 01/17/2017     - 11/21/16: eval w ENT Dr. Jannette Spanner [facility]): For recurrent sinusitis, he is on good medical regiment w daily nasal irrigations. Surgery would be indicated for 4-5 episodes of sinusitis per year. He was not interested in operative intervention and this is reasonable. In re to his deviated septum septoplasty could be considered. He was not interested at this time. To address his nasal obstruction on a mucosal basis I recommended triple spray. Avoid afrin. Offered him fluoroscopic eval of his dysphagia but he deferred. Would not recommend surgery for OSA.      [name] Chronic back pain 11/09/2015     - [facility] Recommend tramadol for severe breakthrough pain.  MRI of the cervical spine ordered.  Continue at-home exercises and stretches and physical therapy.  Follow-up in 4 weeks for further evaluation  - As of May 2018 pt reports he gets his pain medication and klonazepam from [facility]  - 07/01/16: note from [facility] Dr. Toni Amend: Pt informed I will no longer refill percocet, clonazepam, or provigil as there are no clear disease indications. Pt upset and feels he needs these meds for his CF/FMS. I advised pt that he will need to have either rheum or pain specialist provide if they feel they are indicated. Pt has shown no evidence of abuse, but also has been on meds for several years and  has shown no benefit in my opinion. Pt is currently being treated for panhypopit and adrenal insuff, hypogonadism, and hypothyroidism as a result, as wellas, growth hormone deficiency. Further analysis suggests polypharmacy with potential drug interactions. The pt knows how to wean clonazepam and has asked for 30days of tramadol to wean from the percocet. Note he is only on a half tablet of percocet qid. As a result will provide only 25mg  tabs to start qday and increase every  third day to bid and tid max for 30 days total. No refills will be provided. Pt is aware.   - 2005: developed R hip and leg pain and was told he had "small central disk herniation" at L5-S1. Surgery was not recommended. He received PT and some epidural corticosteroid injections which improved but did not completely resolve the situation. Low back and leg pain recurred in 2006 and was again tx w steroid and PT.      [name] History of asthma 01/24/2013     - since childhood    - 11/23/16: eval w Dr. Renita Papa (Pulm at [facility]): bronchial asthma mild, allergic rhinitis postnasal drainage: uses albuterol BID for chest tightness. Check nitric oxide test. He has had intermittent eosinophilia and this points towards an eosinophilic asthma phenotype. He does have allergic rhinitis and think he would be better servied with our triple combination nose spray which includes mometasone, diphenhydramine and iptratroprium. I gave him rx for this.      [name] BPH (benign prostatic hyperplasia) 01/24/2013       The patient's past medical history, surgical history, medications and allergies were reviewed.    Allergies   Allergen Reactions   . Bextra [Valdecoxib] Rash       Medications  Current Outpatient Prescriptions   Medication Sig Dispense Refill   . albuterol (PROVENTIL HFA;VENTOLIN HFA) 108 (90 Base) MCG/ACT inhaler Inhale 1 puff into the lungs every 4 (four) hours as needed for Wheezing or Shortness of Breath. 18 g 6   . amLODIPine (NORVASC) 5 MG tablet TAKE ONE TABLET  BY MOUTH TWICE DAILY  180 tablet 1   . Calcium Carbonate-Vitamin D (CALCIUM + D PO) Take by mouth. 500/400mg   daily      . carvedilol (COREG) 6.25 MG tablet Take 2 tablets (12.5 mg total) by mouth 2 (two) times daily with meals 360 tablet 1   . clonazePAM (KLONOPIN) 0.5 MG tablet Take 0.25 mg by mouth daily as needed.      2   . Coenzyme Q10 100 MG capsule Take 100 mg by mouth daily.     [name] ezetimibe (ZETIA) 10 MG tablet TAKE ONE TABLET BY MOUTH ONE TIME DAILY  90 tablet 3   . finasteride (PROSCAR) 5 MG tablet Take 1 tablet (5 mg total) by mouth daily 90 tablet 0   . FLOVENT HFA 110 MCG/ACT inhaler Inhale 2 puffs into the lungs 2 (two) times daily.         . hydrocortisone (CORTEF) 10 MG tablet Varies daily. As of 02/13/18 Take 1 every morning,1.5  tablet at 8 AM     . levothyroxine (SYNTHROID, LEVOTHROID) 150 MCG tablet Take 1 tablet daily at 6pm. 90 tablet 3   . metoclopramide (REGLAN) 5 MG tablet Take 1 tablet (5 mg total) by mouth 4 (four) times daily as needed (nausea) 24 tablet 0   . montelukast (SINGULAIR) 10 MG tablet TAKE ONE TABLET BY MOUTH AT BEDTIME  90 tablet 2   . Multiple Vitamins-Minerals (CENTRUM SILVER PO) Take 1 tablet by mouth daily.       [name] pyridoxine (B-6) 100 MG tablet Take 100 mg by mouth daily.     . ramipril (ALTACE) 10 MG capsule TAKE TWO CAPSULES BY MOUTH DAILY  180 capsule 2   . testosterone (ANDROGEL) 25 MG/2.5GM (1%) Gel 2.5 mg daily.      1   . traMODol (ULTRAM-ER) 300 MG 24 hr tablet Take 300 mg by mouth daily  1   . VITAMIN D, CHOLECALCIFEROL, PO Take 5,000 Units by mouth daily.          No current facility-administered medications for this visit.        Review of Systems  CONST: 6 lb WL, no fevers/chills sweats, + fatigue, muscle aches  EYES: No blurry vision, double vision, loss of peripheral vision  NOSE: No congestion, runny nose or bloody nose  MOUTH: No sore throat, oral lesions, difficulty swallowing  NECK: No swollen glands, stiffness or pain  CV: No chest pain,  palpitations, leg swelling  RESP: No shortness of breath, cough, wheeze, asthma  GI: + nausea no vomiting, no constipation, mild diffuse suprapubic abd pain, no blood in stool  MALE GU: No dysuria, + frequency, no hematuria, nocturia 1-2   MSK:  No other new joint or muscle pain, swelling or weakness - chronic LBP  SKIN:   No new rashes lumps, sores or concerning moles.   ENDO:  + cold intolerance, polyuria, polydipsia, polyphagia  NEURO:  No headache, lightheadedness, dizziness, loss of consciousness, numbness/tingling, + weakness    Physical Exam  BP (!) 125/92 (BP Site: Right arm, Patient Position: Sitting, Cuff Size: Medium)   Pulse 81   Temp 98.2 F (36.8 C) (Oral)   Resp 16   Wt 80.1 kg (176 lb 9.6 oz)   SpO2 98%   BMI 25.34 kg/m   Wt Readings from Last 3 Encounters:   03/23/18 80.1 kg (176 lb 9.6 oz)   02/13/18 82.9 kg (182 lb 12.8 oz)   09/27/17 85.9 kg (189 lb 6.4 oz)     GEN: Well developed well nourished male alert and appropriate in no apparent distress appearing tired  HENT: oropharynx normal w no exudate or erythema  EYES: PERRL,  extra ocular movements intact, no pallor or scleral icterus, normal conjunctiva  NECK: supple, no thyromegaly or nodules appreciated  LYMPH: no cervical or supraclavicular lymphadenopathy appreciated  CV: regular rate and rhythm no m/r/g.  No lower extremity edema.  2+ radial pulses present and equal.  RESP: clear to auscultation bilaterally, normal effort, no rhonchi or rales  GI: soft, mild suprapublic tenderness normoactive bowel sounds, no rebound or guarding  SKIN: warm, dry.  no visible rashes or concerning moles  NEURO: PERRLA, extra ocular movements intact, face symmetric, hearing intact to voice, palate raise, shoulder shrug and neck flexion intact, tongue midline. Moves all extremities.   PSYCH: depressed / anxious mood and affect    Assessment/Plan    1. Nausea  Recommend he check labs below   - Comprehensive metabolic panel  - CBC without  differential    2. Fatigue, unspecified type  Pt previous episodes similar to this have responded to steroid burst. Recommend he increase steroid dose of cortef from 20-25 mg daily to 40-50 mg daily for 3 days and observe for effect - if improved then gradually taper back down to baseline. If sx not improved within 24 hours recommend increase dose to 60-75 mg daily. I personally called [facility] to Dr. Zigmund Gottron office to arrange consultation there as he needs an endocrinologist with specialty in adrenal / pituitary / GH abnormalities (as recommended by Dr. Tamera Punt). They report they will contact the patient for further information and assistance with this.    3. Frequent urination  - Urinalysis Reflex to Microscopic Exam- Reflex to Culture    4. Secondary adrenal insufficiency    5. Primary hypothyroidism  - T4, free    6. Secondary  male hypogonadism  - Testosterone, free, total  - TESTOSTERONE, FREE AND TOTAL    Orders Placed This Encounter   Procedures   . Comprehensive metabolic panel   . Urinalysis Reflex to Microscopic Exam- Reflex to Culture   . T4, free   . CBC without differential   . Testosterone, free, total     Return if symptoms worsen or fail to improve.    Gloriajean Dell, MD  [facility]  [address]  [place], [place] [address]  P) [phone]  F) [phone]  [email or web address]

## 2018-03-24 ENCOUNTER — Encounter: Payer: Self-pay | Admitting: Internal Medicine

## 2018-03-24 LAB — URINALYSIS REFLEX TO MICROSCOPIC EXAM - REFLEX TO CULTURE
Bilirubin, UA: NEGATIVE
Blood, UA: NEGATIVE
Glucose, UA: NEGATIVE
Ketones UA: NEGATIVE
Leukocyte Esterase, UA: NEGATIVE
Nitrite, UA: NEGATIVE
Protein, UA: NEGATIVE
Urine Specific Gravity: 1.027 (ref 1.005–1.030)
Urobilinogen, Ur: 0.2 mg/dL (ref 0.2–1.0)
pH, UA: 8 — ABNORMAL HIGH (ref 5.0–7.5)

## 2018-03-24 LAB — COMPREHENSIVE METABOLIC PANEL
ALT: 52 IU/L — ABNORMAL HIGH (ref 0–44)
AST (SGOT): 37 IU/L (ref 0–40)
Albumin/Globulin Ratio: 1.8 (ref 1.2–2.2)
Albumin: 4.6 g/dL (ref 3.6–4.8)
Alkaline Phosphatase: 58 IU/L (ref 39–117)
BUN / Creatinine Ratio: 19 (ref 10–24)
BUN: 21 mg/dL (ref 8–27)
Bilirubin, Total: 1.6 mg/dL — ABNORMAL HIGH (ref 0.0–1.2)
CO2: 24 mmol/L (ref 20–29)
Calcium: 10.2 mg/dL (ref 8.6–10.2)
Chloride: 102 mmol/L (ref 96–106)
Creatinine: 1.13 mg/dL (ref 0.76–1.27)
EGFR: 66 mL/min/{1.73_m2} (ref >59)
EGFR: 77 mL/min/{1.73_m2} (ref >59)
Globulin, Total: 2.5 g/dL (ref 1.5–4.5)
Glucose: 108 mg/dL — ABNORMAL HIGH (ref 65–99)
Potassium: 5.2 mmol/L (ref 3.5–5.2)
Protein, Total: 7.1 g/dL (ref 6.0–8.5)
Sodium: 142 mmol/L (ref 134–144)

## 2018-03-24 LAB — CBC
Hematocrit: 47.2 % (ref 37.5–51.0)
Hemoglobin: 15.6 g/dL (ref 13.0–17.7)
MCH: 29.3 pg (ref 26.6–33.0)
MCHC: 33.1 g/dL (ref 31.5–35.7)
MCV: 89 fL (ref 79–97)
Platelets: 283 10*3/uL (ref 150–450)
RBC: 5.32 x10E6/uL (ref 4.14–5.80)
RDW: 13.1 % (ref 12.3–15.4)
WBC: 11 10*3/uL — ABNORMAL HIGH (ref 3.4–10.8)

## 2018-03-24 LAB — TESTOSTERONE, FREE AND TOTAL
Testosterone, Free: 1.7 pg/mL — ABNORMAL LOW (ref 6.6–18.1)
Testosterone: 156 ng/dL — ABNORMAL LOW (ref 264–916)

## 2018-03-24 LAB — T4, FREE: T4, Free: 2.23 ng/dL — ABNORMAL HIGH (ref 0.82–1.77)

## 2018-03-24 LAB — REFLEX - MICROSCOPIC EXAMINATION: Casts, UA: NONE SEEN /lpf

## 2018-03-25 ENCOUNTER — Encounter: Payer: Self-pay | Admitting: Internal Medicine

## 2018-03-26 ENCOUNTER — Encounter: Payer: Self-pay | Admitting: Internal Medicine

## 2018-03-26 ENCOUNTER — Telehealth: Payer: Self-pay | Admitting: Internal Medicine

## 2018-03-26 LAB — TESTOSTERONE, FREE, TOTAL
Testosterone Total MS: 186 ng/dL — ABNORMAL LOW (ref 264.0–916.0)
Testosterone, Free: 1.2 pg/mL — ABNORMAL LOW (ref 6.6–18.1)

## 2018-03-26 NOTE — Telephone Encounter (Signed)
Dr. [name] called and requested if we could fax appointment request form and release of information form to [facility].  He is having difficulty with his fax machine.  Faxed it to [phone] ATTN Maryln Gottron.

## 2018-03-29 ENCOUNTER — Encounter: Payer: Self-pay | Admitting: Internal Medicine

## 2018-03-30 ENCOUNTER — Encounter (INDEPENDENT_AMBULATORY_CARE_PROVIDER_SITE_OTHER): Payer: Self-pay

## 2018-04-09 ENCOUNTER — Telehealth: Payer: Self-pay

## 2018-04-09 NOTE — Telephone Encounter (Signed)
Patient called requesting a copy of his most recent labs be printed and picked up tomorrow in preparation for appointment tomorrow.   Patient instructed to email lab results to spouse at [email or web address].   Completed in ROI.

## 2018-04-18 ENCOUNTER — Other Ambulatory Visit: Payer: Self-pay | Admitting: Internal Medicine

## 2018-04-18 DIAGNOSIS — R350 Frequency of micturition: Secondary | ICD-10-CM

## 2018-04-18 DIAGNOSIS — N401 Enlarged prostate with lower urinary tract symptoms: Secondary | ICD-10-CM

## 2018-04-21 ENCOUNTER — Encounter: Payer: Self-pay | Admitting: Internal Medicine

## 2018-04-22 ENCOUNTER — Other Ambulatory Visit: Payer: Self-pay | Admitting: Internal Medicine

## 2018-04-22 DIAGNOSIS — N401 Enlarged prostate with lower urinary tract symptoms: Secondary | ICD-10-CM

## 2018-04-22 DIAGNOSIS — R350 Frequency of micturition: Secondary | ICD-10-CM

## 2018-04-22 MED ORDER — FINASTERIDE 5 MG PO TABS
5.0000 mg | ORAL_TABLET | Freq: Every day | ORAL | 3 refills | Status: DC
Start: 2018-04-22 — End: 2019-04-02

## 2018-05-06 ENCOUNTER — Other Ambulatory Visit: Payer: Self-pay | Admitting: Physician Assistant

## 2018-05-08 ENCOUNTER — Other Ambulatory Visit: Payer: Self-pay | Admitting: Internal Medicine

## 2018-05-08 DIAGNOSIS — B370 Candidal stomatitis: Secondary | ICD-10-CM

## 2018-05-21 ENCOUNTER — Telehealth: Payer: Self-pay | Admitting: Internal Medicine

## 2018-05-21 NOTE — Telephone Encounter (Signed)
Surgeon's office called to let them know that patient will be seen tomorrow to have pre-op clearance done.   Patient called and scheduled for 05/22/2018 with Dr. Toni Amend @ 10:15am.

## 2018-05-21 NOTE — Telephone Encounter (Signed)
[facility] called and they where checking on the status of a medical clearance     Contact number: [phone]

## 2018-05-21 NOTE — Telephone Encounter (Signed)
Called Dr. [name] to inform him of his 10:15 am appointment on 05/22/2018 for his medical clearance for oral surgery.   Informed him that he would be seeing Dr. Toni Amend since Dr. Orlin Hilding is out of the office this week.

## 2018-05-22 ENCOUNTER — Telehealth: Payer: Self-pay | Admitting: Internal Medicine

## 2018-05-22 ENCOUNTER — Encounter: Payer: Self-pay | Admitting: Internal Medicine

## 2018-05-22 ENCOUNTER — Ambulatory Visit (INDEPENDENT_AMBULATORY_CARE_PROVIDER_SITE_OTHER): Payer: Medicare Other | Admitting: Internal Medicine

## 2018-05-22 VITALS — BP 143/79 | HR 61 | Temp 98.6°F | Resp 16 | Wt 183.5 lb

## 2018-05-22 DIAGNOSIS — Z01818 Encounter for other preprocedural examination: Secondary | ICD-10-CM

## 2018-05-22 DIAGNOSIS — K117 Disturbances of salivary secretion: Secondary | ICD-10-CM

## 2018-05-22 DIAGNOSIS — R682 Dry mouth, unspecified: Secondary | ICD-10-CM

## 2018-05-22 MED ORDER — CEVIMELINE HCL 30 MG PO CAPS
30.0000 mg | ORAL_CAPSULE | Freq: Three times a day (TID) | ORAL | 3 refills | Status: DC
Start: 2018-05-22 — End: 2018-05-28

## 2018-05-22 NOTE — Telephone Encounter (Signed)
Faxed snap shot, medimap results, MD notes to Dr. Jacqualin Combes office at [phone].

## 2018-05-22 NOTE — Progress Notes (Signed)
Lab drawn from right [facility] using aseptic technique without any problem.  Lab specimen sent to [facility].

## 2018-05-22 NOTE — Progress Notes (Signed)
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    HPI    Preop clearance:  Cracked tooth that needs to be removed with bone graft. Procedure 30-45 minutes. General anesthesia planned.     Currently denies: SOB, CP, palpitations, orthopnea, PND, Abd pain, edema, difficulties with urination or BM. No recent acute illness/infection. No change in chronic fatigue, but able to ambulate several city blocks or one flight of stairs. No recent exotic travel. Pt planning travel to [place] post procedure.    Pt told by [facility] he does not need CPAP and does not have sleep apnea. Asthma stable per patient. No recorded exacerbations.     No history of any bleeding issues and not on any antiplatelet agents in the last 7 days.     No recent renal issues.     No prior issues with anesthesia from a general procedure standpoint or medications.     No acute issues going on right now related to procedure or anesthesia. Patient has ongoing FM/CFS issues that have been followed by [facility].     Patient with a known history of a meningioma that has resulted in a panhypopituitarism diagnosis, but whose replacement therapy is currently stable. He is aware stress dose steroids will be required.     Patient agrees to procedure only in office if anesthesiology is present otherwise must be done in hospital.    Past Medical History:   Diagnosis Date   . Avascular necrosis 2008    R hip - did not require surgery   . Breathlessness on exertion     eval at [facility] March 2018 - saw pulmonary - PFT normal   . Cold intolerance    . Color blindness    . DJD (degenerative joint disease)     Lumbar / Sacral   . Dry eyes     uses restasis eyedrops which helps (RF, CCP, CRP and ESR have been wnl in past)   . Eczema 03/29/2016    Right thumb    . Encounter for hepatitis C screening test for low risk patient 08/2016    negative   . Encounter for pharmacogenetic testing 10/2015    MEDIMAP   . Hemorrhoids    . Lateral epicondylitis     Left    . Osteoporosis    . Screening PSA (prostate specific antigen) 02/14/2018    0.4   . Tinnitus     high pitched buzzing comes and goes - started after craniotomy for meningioma       Past Surgical History:   Procedure Laterality Date   . COLONOSCOPY  2008   . EXCISION, LESION  12/03/2008    benign lesion   . ORCHIOPEXY Left 1980    for intermittent torsion   . Subtotal resection of posterior sagittal meningioma  12/05/2002    Partial brain resection secondary to meningioma at [facility]   . TONSILLECTOMY AND ADENOIDECTOMY  as a child       Allergies   Allergen Reactions   . Bextra [Valdecoxib] Rash       Current Outpatient Medications on File Prior to Visit   Medication Sig Dispense Refill   . albuterol (PROVENTIL HFA;VENTOLIN HFA) 108 (90 Base) MCG/ACT inhaler Inhale 1 puff into the lungs every 4 (four) hours as needed for Wheezing or Shortness of Breath. 18 g 6   . amLODIPine (NORVASC) 5 MG tablet TAKE ONE TABLET BY MOUTH  TWICE DAILY  180 tablet 1   . Calcium Carbonate-Vitamin D (CALCIUM + D PO) Take by mouth. 500/400mg   daily      . carvedilol (COREG) 6.25 MG tablet Take 2 tablets (12.5 mg total) by mouth 2 (two) times daily with meals 360 tablet 1   . clonazePAM (KLONOPIN) 0.5 MG tablet Take 0.25 mg by mouth daily as needed.      2   . Coenzyme Q10 100 MG capsule Take 100 mg by mouth daily.     [name] ezetimibe (ZETIA) 10 MG tablet TAKE ONE TABLET BY MOUTH ONE TIME DAILY  90 tablet 3   . finasteride (PROSCAR) 5 MG tablet Take 1 tablet (5 mg total) by mouth daily 90 tablet 3   . FLOVENT HFA 110 MCG/ACT inhaler Inhale 2 puffs into the lungs 2 (two) times daily.         [name] gabapentin (NEURONTIN) 300 MG capsule Take 300 mg by mouth 3 (three) times daily     . hydrocortisone (CORTEF) 10 MG tablet Varies daily. As of 02/13/18 Take 1 every morning,1.5  tablet at 8 AM     . levothyroxine (SYNTHROID, LEVOTHROID) 150 MCG tablet Take 1 tablet daily at 6pm. 90 tablet 3   . metoclopramide (REGLAN) 5 MG tablet Take 1 tablet (5 mg total)  by mouth 4 (four) times daily as needed (nausea) 24 tablet 0   . montelukast (SINGULAIR) 10 MG tablet TAKE ONE TABLET BY MOUTH AT BEDTIME  90 tablet 2   . Multiple Vitamins-Minerals (CENTRUM SILVER PO) Take 1 tablet by mouth daily.       [name] nystatin (MYCOSTATIN) 100000 UNIT/ML suspension SWISH IN MOUTH AND RETAIN FOR AS LONG AS POSSIBLE (SEVERAL MINUTES) BEFORE SWALLOWING 4 TIMES DAILY 473 mL 2   . pyridoxine (B-6) 100 MG tablet Take 100 mg by mouth daily.     . ramipril (ALTACE) 10 MG capsule TAKE TWO CAPSULES BY MOUTH DAILY  180 capsule 2   . testosterone (ANDROGEL) 25 MG/2.5GM (1%) Gel 2.5 mg daily.      1   . traMODol (ULTRAM-ER) 300 MG 24 hr tablet Take 300 mg by mouth daily      1   . VITAMIN D, CHOLECALCIFEROL, PO Take 5,000 Units by mouth daily.          No current facility-administered medications on file prior to visit.        Social History     Socioeconomic History   . Marital status: Married     Spouse name: Angelique Blonder   . Number of children: Not on file   . Years of education: Not on file   . Highest education level: Not on file   Occupational History   . Retired Journalist, newspaper   . Financial resource strain: Not on file   . Food insecurity:     Worry: Not on file     Inability: Not on file   . Transportation needs:     Medical: Not on file     Non-medical: Not on file   Tobacco Use   . Smoking status: Never Smoker   . Smokeless tobacco: Never Used   Substance and Sexual Activity   . Alcohol use: No     Comment: rare   . Drug use: Yes     Comment: uses MJ for pain daily   . Sexual activity: Not on file   Lifestyle   . Physical activity:  Days per week: Not on file     Minutes per session: Not on file   . Stress: Not on file   Relationships   . Social connections:     Talks on phone: Not on file     Gets together: Not on file     Attends religious service: Not on file     Active member of club or organization: Not on file     Attends meetings of clubs or organizations: Not on file      Relationship status: Not on file   . Intimate partner violence:     Fear of current or ex partner: Not on file     Emotionally abused: Not on file     Physically abused: Not on file     Forced sexual activity: Not on file   Other Topics Concern   . Not on file   Social History Narrative    He lives in [place] and is from [place]. Married - wife Angelique Blonder. Two daughters (one lives in [place] and one in [place]). Retired Web designer - retired in 2016.       Family History   Problem Relation Age of Onset   . Asthma Daughter    . Tuberculosis Mother    . Dementia Mother    . Migraines Sister        Review of Systems  Patient without any acute issues related to CV, pulm, GI, renal, ENT, MSK, Derm, Heme, or psych-note this excludes chronic CFS/FM and ongoing insomnia and neurologic complaints.       Objective:    Physical Exam  Vitals:    05/22/18 1027   BP: 143/79   Pulse: 61   Resp: 16   Temp: 98.6 F (37 C)   SpO2: 99%   HEENT: NCAT, PERRL, EOMI, anicteric, oropharynx clear without mass or obstruction, nares patent    Neck: supple, full ROM, no bruits or adenopathy, no increased JVD    Lungs: CTA ant/post bilateral    Cor: RRR S1 and S2 nl, no S3, no murmur noted    Abd: NT/ND, NABS, no bruits, no HSM    Ext: No C/C/edema    Neuro: at baseline without acute focal deficits.     PT/INR ordered and drawn as requested.  EKG-none requested-no prior cardiac issues.     Office Visit on 03/23/2018   Component Date Value Ref Range Status   . Specific Gravity, UR 03/23/2018 1.027  1.005 - 1.030 Final   . pH, UA 03/23/2018 8.0* 5.0 - 7.5 Final   . Color, UA 03/23/2018 Yellow  Yellow Final   . Appearance: 03/23/2018 Clear  Clear Final   . Leukocyte Esterase, UA 03/23/2018 Negative  Negative Final   . Protein, UA 03/23/2018 Negative  Negative/Trace Final   . Glucose, UA 03/23/2018 Negative  Negative Final   . Ketones UA 03/23/2018 Negative  Negative Final   . Blood, UA 03/23/2018 Negative  Negative Final   . Bilirubin, UA 03/23/2018  Negative  Negative Final   . Urobilinogen, Ur 03/23/2018 0.2  0.2 - 1.0 mg/dL Final   . Nitrite, UA 54/04/8118 Negative  Negative Final   . Microscopic Examination: 03/23/2018 Comment   Final   . Microscopic Examination: 03/23/2018 See below:   Final   . Urinalysis Reflex 03/23/2018 Comment   Final   . Testosterone, Total, MS 03/23/2018 186.0* 264.0 - 916.0 ng/dL Final   . Testosterone, Free 03/23/2018 1.2* 6.6 - 18.1 pg/mL  Final   . Glucose 03/23/2018 108* 65 - 99 mg/dL Final   . BUN 16/05/9603 21  8 - 27 mg/dL Final   . Creatinine 54/04/8118 1.13  0.76 - 1.27 mg/dL Final   . EGFR 14/78/2956 66  >59 mL/min/1.73 Final   . EGFR 03/23/2018 77  >59 mL/min/1.73 Final   . BUN/Creatinine Ratio 03/23/2018 19  10 - 24 Final   . Sodium 03/23/2018 142  134 - 144 mmol/L Final   . Potassium 03/23/2018 5.2  3.5 - 5.2 mmol/L Final   . Chloride 03/23/2018 102  96 - 106 mmol/L Final   . CO2 03/23/2018 24  20 - 29 mmol/L Final   . Calcium 03/23/2018 10.2  8.6 - 10.2 mg/dL Final   . Protein, Total 03/23/2018 7.1  6.0 - 8.5 g/dL Final   . Albumin 21/30/8657 4.6  3.6 - 4.8 g/dL Final   . Globulin, Total 03/23/2018 2.5  1.5 - 4.5 g/dL Final   . Albumin/Globulin Ratio 03/23/2018 1.8  1.2 - 2.2 Final   . Bilirubin, Total 03/23/2018 1.6* 0.0 - 1.2 mg/dL Final   . Alkaline Phosphatase 03/23/2018 58  39 - 117 IU/L Final   . AST (SGOT) 03/23/2018 37  0 - 40 IU/L Final   . ALT 03/23/2018 52* 0 - 44 IU/L Final   . WBC 03/23/2018 11.0* 3.4 - 10.8 x10E3/uL Final   . RBC 03/23/2018 5.32  4.14 - 5.80 x10E6/uL Final   . Hemoglobin 03/23/2018 15.6  13.0 - 17.7 g/dL Final   . Hematocrit 84/69/6295 47.2  37.5 - 51.0 % Final   . MCV 03/23/2018 89  79 - 97 fL Final   . MCH 03/23/2018 29.3  26.6 - 33.0 pg Final   . MCHC 03/23/2018 33.1  31.5 - 35.7 g/dL Final   . RDW 28/41/3244 13.1  12.3 - 15.4 % Final   . Platelets 03/23/2018 283  150 - 450 x10E3/uL Final   . T4, Free 03/23/2018 2.23* 0.82 - 1.77 ng/dL Final   . WBC, UA 08/24/7251 0-5  0 - 5 /hpf Final    . RBC, UA 03/23/2018 0-2  0 - 2 /hpf Final   . Epithelial Cells (non renal) 03/23/2018 0-10  0 - 10 /hpf Final   . Casts, UA 03/23/2018 None seen  None seen /lpf Final   . Crystals UA 03/23/2018 Present* N/A Final   . Crystal Type 03/23/2018 Amorphous Sediment  N/A Final   . Mucus, UA 03/23/2018 Present  Not Estab. Final   . Bacteria, UA 03/23/2018 Few  None seen/Few Final   . Testosterone 03/23/2018 156* 264 - 916 ng/dL Final   . Testosterone, Free 03/23/2018 1.7* 6.6 - 18.1 pg/mL Final         Assessment:     Pt here for preoperative clearance for less than 60 min dental procedure under general anesthesia. While patient has many chronic issues he currently has no acute issues or exacerbations. The procedure may occur with the following stipulations below.        Plan:    1. Patient requires anesthesiology to be present whether in office or hospital.  2. Patient instructed to take 100mg  of hydrocortisone 8 hours before procedure, receive decadron as planned for procedure, and repeat hydrocortisone 100mg  8 hours post procedure. Pt aware of adrenal insuff sxs and to call me if on day two feeling any weakness.   3. Pt denies sleep apnea, but anesthesia to be aware of history of mild  intermittent asthma and possible mild OSA.  4. PT/INR drawn.   5. Patient should take all blood pressure and panhypopit meds unless instructed otherwise by surgeon. May hold cholesterol meds and should discuss all current pain meds with anesthesia-note pt on gabapentin, but at low dose.     If any questions feel free to call me at [phone].   Procedures  No orders of the defined types were placed in this encounter.

## 2018-05-23 LAB — PT/INR
PT INR: 1 (ref 0.8–1.2)
PT: 10.7 s (ref 9.1–12.0)

## 2018-05-28 ENCOUNTER — Encounter: Payer: Self-pay | Admitting: Internal Medicine

## 2018-05-28 ENCOUNTER — Other Ambulatory Visit: Payer: Self-pay | Admitting: Internal Medicine

## 2018-05-28 DIAGNOSIS — K117 Disturbances of salivary secretion: Secondary | ICD-10-CM

## 2018-05-28 MED ORDER — PILOCARPINE HCL 5 MG PO TABS
5.0000 mg | ORAL_TABLET | Freq: Three times a day (TID) | ORAL | 0 refills | Status: DC
Start: 2018-05-28 — End: 2018-07-25

## 2018-06-05 ENCOUNTER — Other Ambulatory Visit: Payer: Self-pay | Admitting: Internal Medicine

## 2018-06-05 ENCOUNTER — Telehealth: Payer: Self-pay

## 2018-06-05 DIAGNOSIS — J4531 Mild persistent asthma with (acute) exacerbation: Secondary | ICD-10-CM

## 2018-06-05 MED ORDER — HYDROCORTISONE 10 MG PO TABS
ORAL_TABLET | ORAL | 0 refills | Status: DC
Start: 2018-06-05 — End: 2018-06-05

## 2018-06-05 MED ORDER — ALBUTEROL SULFATE 0.63 MG/3ML IN NEBU
1.0000 | INHALATION_SOLUTION | RESPIRATORY_TRACT | 1 refills | Status: AC | PRN
Start: 2018-06-05 — End: 2018-06-12

## 2018-06-05 MED ORDER — HYDROCORTISONE 5 MG PO TABS
ORAL_TABLET | ORAL | 0 refills | Status: DC
Start: 2018-06-05 — End: 2018-07-12

## 2018-06-05 MED ORDER — AMOXICILLIN-POT CLAVULANATE 875-125 MG PO TABS
1.0000 | ORAL_TABLET | Freq: Two times a day (BID) | ORAL | 0 refills | Status: AC
Start: 2018-06-05 — End: 2018-06-10

## 2018-06-05 NOTE — Telephone Encounter (Signed)
Started in head and went into my chest, started Augmentin 875mg  two days ago and already feeling better, "I have really bad asthma", Augmentin is expired.  Started steroids last night and started to feel better as well, Hydocrortisone 10mg  tablets (mg for three days then taper).  Patient is requesting 65 tablets for weaning process.      Also, requesting a refill of Augmentin.

## 2018-06-05 NOTE — Progress Notes (Signed)
I spoke with the patient today.  He is concerned about a sinus infection, or chest cold.  He has been self treating with a previous prescription of Augmentin.  He last had from Dr. Toni Amend.  This expired in March and he has taken 3 days of a 5 day course.  He notes cough, chest congestion, head cold, sinus pressure.  He reports she has "been dealing with these kinds of infections his whole life" and has treated these in the past with a burst of steroids and also antibiotics.  He reports taking and tolerating in the past.  Hydrocortisone 10 mg, 65 tablets with a rapid taper every 3 days down by 10 mg. ,  He reports he is using the steroids and responding to these but needs a refill of medication.  He denies any fevers or worsening symptoms.  He has been using nasal saline and nebulizer.  Given his previous history and reported benefit from this in the past.  Will provide him with this medication.  He should follow up with me if symptoms worsen or persist or do not improve.  He expressed understanding.[name]

## 2018-06-06 ENCOUNTER — Other Ambulatory Visit: Payer: Self-pay | Admitting: Internal Medicine

## 2018-06-06 DIAGNOSIS — E782 Mixed hyperlipidemia: Secondary | ICD-10-CM

## 2018-06-12 NOTE — Progress Notes (Signed)
Signed diagnosis code requirement scanned into chart and faxed back to [facility] to fax [phone].

## 2018-06-22 HISTORY — PX: TOOTH EXTRACTION: SUR596

## 2018-06-28 ENCOUNTER — Other Ambulatory Visit: Payer: Self-pay | Admitting: Internal Medicine

## 2018-06-28 DIAGNOSIS — I10 Essential (primary) hypertension: Secondary | ICD-10-CM

## 2018-07-12 ENCOUNTER — Ambulatory Visit (INDEPENDENT_AMBULATORY_CARE_PROVIDER_SITE_OTHER): Payer: Medicare Other | Admitting: Internal Medicine

## 2018-07-12 ENCOUNTER — Encounter: Payer: Self-pay | Admitting: Internal Medicine

## 2018-07-12 DIAGNOSIS — Z8709 Personal history of other diseases of the respiratory system: Secondary | ICD-10-CM

## 2018-07-12 DIAGNOSIS — F1290 Cannabis use, unspecified, uncomplicated: Secondary | ICD-10-CM | POA: Insufficient documentation

## 2018-07-12 MED ORDER — BUDESONIDE-FORMOTEROL FUMARATE 80-4.5 MCG/ACT IN AERO
2.0000 | INHALATION_SPRAY | Freq: Two times a day (BID) | RESPIRATORY_TRACT | 3 refills | Status: DC
Start: 2018-07-12 — End: 2018-11-10

## 2018-07-12 NOTE — Patient Instructions (Signed)
Please schedule an appointment with pulmonology (lung specialists):    [facility]  [address], [address], [place], [place]   [phone] Fax [phone]  [address], [address], [place], [place]   [phone]  [address], [address], [place], [place]   [phone]  [address], [place], [place]   [phone]  [address], [place], [place]   [phone]  [email or web address]    Smiley Houseman, MD  Johnston Ebbs, MD  Doreen Beam, MD  Orland Mustard, MD  Myles Gip, MD  Joline Salt, MD  Elmer Sow, MD  Claudette Stapler, MD  Erenest Rasher, MD    OR    [facility]  [address], [address]  [place], [place] [address]  [phone] Fax [phone]  [email or web address]    Timothy Lasso, MD  Lisette Grinder, MD  Gus Height, MD  Rosario Adie, MD  Adlah Lisette Grinder, MD  Fabiola Backer MD  Lavonia Dana MD  Hilarie Fredrickson, MD  Renford Dills, MD    OR    [facility] [place]  [address]  [place], [place] [address]  Phone: [phone]  Fax: [phone]

## 2018-07-12 NOTE — Progress Notes (Signed)
Chief Complaint   Patient presents with   . 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 He is here for concerns for worsening asthma.  He has a chronic history of asthma that generally has been controlled in the past with use of Flovent and when necessary albuterol and when necessary nebulizer.  He notes some worsening asthma over the last few weeks.  He has self treated this by increasing his hydrocortisone dose as high as 70 mg daily.  He did take 80 mg for 2 days and is now currently taking 70 mg. ,  He feels some worsening wheezing and dyspnea and mild cough.  He denies any evident fever.  He denies any chest pain or lower extremity swelling. Denies any new environmental or occupational exposures provoking his respiratory symptoms.  At baseline he takes Flovent 2 puffs twice a day and albuterol as needed.  He has been using his albuterol 3 times daily.  He has seen a pulmonologist in the past at the [facility], but does not have a local pulmonary specialist.     Active Problems  Patient Active Problem List    Diagnosis Date Noted   . Secondary adrenal insufficiency 01/17/2017     Priority: High     - 04/26/18: Dr. Beckie Salts [facility] Endocrine): Patient's initial evaluation showed possible borderline glucocorticoid deficiency.  He has however been taking supraphysiologic doses of hydrocortisone since 2017.  We do not think he has a hydrocortisone absorption problem based on random serum cortisol and 24-hour values.  We believe his episodes of abdominal pain and nausea improved with SDS because of the anti-inflammatory effects.  We are unfortunately unable to generate a unifying endocrine diagnosis that explains his current symptoms.  The patient understands the dangers of continuing supraphysiologic glucocorticoid replacement but is reluctant to reduce his hydrocortisone dose for fear of worsening illness.  We do feel his other  hormone replacement doses are more appropriate.  We do not recommend any immediate changes.  The patient asked for an internal referral to fibromyalgia specialist if possible.  We can check and see whether [facility] has any ongoing studies in these conditions that might offer potential therapies.  Patient with history of primary hypothyroidism has an empty sella and biochemical evidence of hypogonadism and GH deficiency.  Originally, his ACTH test was borderline, but now after long-term glucocorticoid treatment.  He has obvious central adrenal insufficiency.  His thyroid and growth hormone doses are if anything on the high side.  We cannot attrribute his symptoms to underdosing.  We have explained this to Dr. [name] and we have contacted [facility] colleagues to see if any fibromyalgia expert or clinical trial are available there.  - 09/29/17 Dr. Tamera Punt endo: Hypopituitarism in setting of brain surgery for meningioma. Patient is currently on [facility] for (AI), levothyroxine, testosterone and GH replacement. The question as I discussed with him is whether he is using HC to treat non specific symptoms or what he refers to as "quality of life" or whether he does need high doses due to some issue with HC metabolism. I explained to him that symptoms he experiences are not very specific for adrenal disease and there is concern about chronic over exposure to corticosteroid therapy, with which he is quite familiar. I encouraged him to continue to slowly taper dose of HC to a more physiologic dose, in range  of 20 mg in am and 10 mg in pm. One can also use a dose that is based on his body surface area. In case of suspicion of "adrenal crisis", I suggested he uses one injection of Solucortef rather than taking high oral doses of HC for several days. I also recommended second opinion with Dr. Leonides Grills, Chief, Section of Endocrinology at [facility], in order to discuss adrenal issue. I'm happy to continue to see  him here at [facility]. I also explained to him that have not prescribed growth hormone replacement for adults so he will continue to follow with the [facility] clinic on that. He can return in few months or as needed.  - 09/06/17: eval w Endocrine Dr. Eyvonne Mechanic [facility]): Although after we diagnosed secondary adrenal insufficiency, growth hormone deficiency, hypogonadotropic hypogonadism, and primary hypothyroidism associated with a partially empty sella on imaging, and he had an initial positive response to replacement as time has gone on, he really has not done well and has had several episodes where he gets a virus where he gets very ill with muscle aches like he always has the flu and has nausea, vomiting, and lightheadedness and in general on physiologic hydrocortisone, he awakes in morning with discomfort, particularly in upper back area, and after taking his medications it will be several hours until he has enough energy to get up and do just minor tasks. Around [place], he was ill with what sounds like a viral illness for 3 days and basically had a crisis. He was treated with 100 mg of hydrocortisone orally every 8 hours, and all of his symptoms resolved. He subsequently tapered down on oral hydrocortisone and in last 3 or 4 days has been taking between 130 and 110 mg total per day and continues to feel well as he is slowly tapering this down. We are collecting a 24-hour urine free cortisol on this dose. His other hormone levels are interesting as well. He is on GH at 0.3 mg a day. In past, this had IGF-1's at the upper end of normal range to mildly elevated, now Z-score is just +1.28 and despite taking AndroGel cream on a daily basis, his testosterone level was only 19, whereas in past this was normal. He is sure that he had not forgotten testosterone at that time. His thyroid tests are similar to what they have been in past with a normal free T4 of 1.5 and TSH mildly suppressed. The current ACTH is less than 1.  Other issues will be discussed below. A/P  #1 Empty sella with pituitary insufficiency  #2 Secondary adrenal insufficiency  It is very unusual that he feels perfectly well now after several days of high-dose hydrocortisone. The explanation for this is unclear, but I think it boils down to 1 of 4 things. Either he is malabsorbing hydrocortisone, he is super metabolizing hydrocortisone, he has developed cortisol resistance, or he has an undiagnosed steroid-sensitive disease. There is really no evidence for some other disease with all the other testing that we have done including multiple rheumatologic antibodies. I did order genetic gene interaction testing and am waiting for review of this from pharmacologist to see if there is any suggestion of metabolism abnormalities regarding his various hormones. The 24-hour urine cortisol should give some sort of hint regarding absorption. In any event, the plan we have developed is to continue to slowly taper oral hydrocortisone over next several days to weeks, and I would note a tapering schedule for  this depending on clinical response hopefully we will be able to maintain him not feeling well without significant adverse glucocorticoid effects. Along these lines, I will also get a baseline bone density and body composition, and I am going to check a cortisol level this morning 2 to 3 hrs after he took 50 mg of Cortef, and I am going to repeat testosterone 1 to 2 hours after he applied AndroGel. #3 Secondary hypogonadism, on replacement : Unclear why his testosterone is so different than before where we test this. It may simply be a lab error, but it is unlikely that he did not apply testosterone which he does every morning. Both he and his wife are sure that he took it. #4 Growth hormone deficiency:   Again is unclear why the same dose is resulting in the lower IGF-1, but will increase dose to 0.4 mg per day.  #5 Primary hypothyroidism, on replacement  There is likely a  pituitary component to this as well, and will maintain current replacement which is keeping his free thyroxine very stable at 1.5.  #6 Cognitive dysfunction  This was thoroughly evaluated a year ago without a specific diagnosis. He feels now after several days of higher doses of hydrocortisone that his thinking is definitely clearing. #7 Dry eyes  Appreciate the evaluation from Ophthalmology.  #8 Temperature dysmodulation  Still feels this occasionally, but this has also improved with the hydrocortisone. There is no evidence for dysautonomia. #9 Mild ascending aortic dilation and aortic insufficiency  Would recommend repeating an echo next year, which would be 2 years from original. This remains asymptomatic.  #10 Meningioma  This is unchanged on MRI scanning from 2008 and confirms a slight increase from 2017. Neurology has recommended observation. [name] from Neurosurgery will review as well. #11 Muscle pain  This has basically resolved with the high doses of hydrocortisone. I doubt there is underlying muscle disease, and it may well be a manifestation of partial adrenal insufficiency as it has this achy typical feeling patients get in their muscles.  #12 Hyperglycemia  The fasting glucose of 157 was nonfasting, and we will continue to monitor this.  #13 Unusual cutaneous response to minimal trauma  He brings 2 photos, one of his hands and one of his foot. He bumped his knuckles likely on a treadmill, and he developed erythema on the back of his hand, which extended to the 1st interphalangeal joints of his fingers, and there was a very distinct demarcation around this area of erythema, which resolved after about 6 hours. Similarly, a pair of shoes that was a little uncomfortable that he had on for just a couple of hours; when he took his shoe off, there was demarcated erythema around the 1st metatarsophalangeal joint, and once again, this resolved. I am not sure what the nature of this is. Perhaps Rheumatology  has an idea.  - 08/31/17: Pt email: I was advised by pcp to take 100 mg of hydrocortisone Q8H for three days and then double my usual dose (25-30 mg/day) until I returned to [facility]. The rescue dosage worked like Pharmacist, community. I began to feel better almost immediately and now nausea, loss of appetite, dizziness with standing, chills, cold feet, and perceived cognitive difficulties have markedly improved. I cannot remember the last time I felt this good!   - 11/23/16: eval w Endocrine Dr. Tawanna Cooler [facility]): due for reassessment of FT4 and IGF 1 in 4 weeks after changing dose on 10/27/16. Mail in specimen. Pursue recs  for insomnia from psych. MRI did not show signs of dementia. PFT nl. FU 6-12 mos  - 10/24/16: eval w Endocrine Dr. Tawanna Cooler [facility]): despite adequate replacement w levothyroxine, GH and hydrocortisone he still has significant fatigue, although there has been a definite benefit from the hormone treatment. Levothyroxine dose is a little high given the slightly low TSH and this is not uncommon to see replacement dose requirements decrease for thyroid replacement when [facility] is initiated. We are waiting on reports on IGF1 and testosterone.      [name] History of meningioma of the brain 11/09/2015     Priority: High     12/09/02: s/p R parieto-occipital parasagittal craniotomy for 85-90% resection of R pareito-occipital falx meningioma that originally measured 61 x 24 mm    - 09/06/17: electronic eval w [name] (neurosurg-[facility]): Dr. Rosanne Ashing residual occipital parasagittal meningioma appears stable between 2017 and 2019 though it enlarged a little between 2008 and 2017. It is now calcifying, suggesting that further growth is unlikely. I do not think it is responsible for any ongoing symptoms. I would suggest a follow up MRI in 2 years.  - 09/05/17: eval w Dr. Vira Blanco (Neuro- [facility]): I reviewed the MRI scan of the head which shows a posterior fall seen and parafalcine meningioma which is minimally enlarged compared to  January 2017 and not significantly changed since April 2018. 1. Parasagittal meningioma which is essentially stable and calcified on CT scan. Dr.Parney advised against reoperation if stable; it is more stable than unstable but at the request of Dr. [name] , I will request an E consultation from [name]. 2. I am unsure about the episodes of acute dysphoria, fatigue, nausea and other symptoms. A favorable response to steroids may suggest adrenal insufficiency especially in the context of his already known pituitary problems. However, corticosteroids may have favorable effects in many situations. He is scheduled to see Dr. Madie Reno in endocrinology again and I look forward to his opinion in that regard. 3. I am unsure of the cause of his chronic pain. He really does not have any myofascial trigger points that would be in keeping with fibromyalgia and would be very unusual with severe chronic fibromyalgia not to have demonstrable trigger points. This problem has been going on for quite a long time, however. 4. I reviewed his autonomic reflex studies which do not provide convincing evidence for dysautonomia. As noted, he is able to get erections and ejaculate, which is reassuring about his autonomic function.   - 10/27/16: eval w [name] (Neurology - [facility]): agree w [name] that lesion is stable and that calcification should be reassuring . I recommended he remain under imaging surveillance. I have told him that it is very unlikely that there is a dysautonomia to account for his sx when upright, but I personally like to be sure that we have no evidence for that. He will be seen in Sleep Medicine and I endorse that as well.   - 07/04/16: eval w  [name] (Neurosurgery -[facility]): stable both clinically and on imaging w regard to his now calcified residual parasagittal meningioma. ... Now that it is calcified, I am not convinced there is any change between Jan and Oct of this year. I think continued  observation would be very reasonable. Recommend FU MRI in one year.   - MRI Brain w/wo contrast 06/07/16: Again seen is a L posterior parafalcine meningioma. There is a slightly greater measurement in the cephalocaudal plane which  may be related to technique or minimal tumor progression. Progressive opacification of the L mastoid air cells. Otherwise stable exam  - 10/13/16: letter from [name]: Neurosurgery at [facility]: "CT scan does show dents of calcification.Marland KitchenMarland KitchenAs we discussed in clinic I think it is unlikely that ongoing sx are related to meningioma .[name] I would continue to follow w MRI scan in one year.   - 10/07/15: eval w [name], Neurosurgery at [facility]: "number of nonspecific sx. I am not certain that they are related to any enlargement of his residual meningioma and certainly would not expect them to be improved by any treatment of his residual meningioma. With that in minds I think the question to establish is whether or not this residual is in fact enlarging. ... I suspect that this lesion is stable and in fact calcified. If that were the case, observation with another MRI scan in a year may be very reasonable. Alternatively we could consider either gamma knife radiosurgery or craniotomy and resection. The lesion is at the UL for size for what we could treat w gamma knife, but I think it could be accomplished in one or perhaps two staged procedures. However I would only recommend this if the lesion was clearly enlarging and I am not certain of this at the present time. Recommend CT scan of head to confirm that lesion is calcified.        . Elevated ALT measurement 02/14/2018     Priority: Medium     03/23/18: ALT 52  02/14/18: ALT 49   02/2017 nl LFT     . IFG (impaired fasting glucose) 02/13/2018     Priority: Medium     - A1C 6.2 02/14/18 FBG 104  - A1C 5.9 09/04/17 with [facility] labs     . Basal cell carcinoma (BCC) of skin of nose 01/29/2018     Priority: Medium     Dx in June 2019 recommended for MOHS  in July 2019     - eval w Dr. Rickard Patience 01/02/18: s/p biopsy L nasal area     . Spondylosis of cervical region without myelopathy or radiculopathy 08/09/2017     Priority: Medium     - MRI C Spine w/o contrast: C2-C3: Small posterior disc bulge partially effacing the ventral CSF and gently indenting the ventral cervical spinal cord.  There is no underlying cord signal abnormality.  There is no central canal nor is there foraminal narrowing.  C3-C4: There is a disc osteophyte complex with a superimposed posterior central disc protrusion which partially effaces the ventral CSF and indents the ventral cervical spinal cord.  There is no central canal stenosis or foraminal narrowing.  C4-C5: There is a disc osteophyte complex with prominent posterior lateral disc component within the foramina bilaterally, right greater than left.  Posterior disc bulge partially effaces the ventral CSF and indents the right ventral cervical spinal cord.  There is mild central canal stenosis.  There is no foraminal narrowing.  C5-C6: There is disc space narrowing and disc desiccation.  There is a disc osteophyte complex with prominent uncovertebral hypertrophy, right greater than left.  Posterior disc bulge partially effaces the ventral CSF.  There is no central canal stenosis.  There is mild to moderate right-sided foraminal encroachment.  The left foramen is patent.  C6-C7: There is disc space narrowing and disc desiccation.  There is a disc osteophyte complex with right posterior lateral disc herniation partially effacing the ventral CSF and mildly  flattening.  The underlying cervical spinal cord.  There is marked right-sided foraminal encroachment as well as mild left-sided foraminal narrowing.  There is facet arthropathy.  C7-T1: There is no evidence of central canal or foraminal stenosis.  Overall impression: Multilevel cervical spondylosis as described in detail above.  - evaluation [facility]. Penni Bombard Nov 2018     .  Mild OSA  01/17/2017     Priority: Medium     - 10/31/16: eval w sleep medicine Dr. Ala Dach  [facility]): reviewed original records from sleep study in Sept 2017 and CPAP trial in Oct 2017 ... My opinion at this time his OSA is not sig enough to require a focused effort on using his CPAP consistently ... Particularly since he reports not having found CPAP to be helpful or restful, I think it is best to set it aside for the time being and at some point another sleep lab study and titration may be helpful. ... Depending on comorbidities this degree of OSA is worth an effort at treating but when there has not been any consistent benefit even in the past when he tried it consistently, then it is reasonable to put on hold for the time being.      [name] History of insomnia 01/17/2017     Priority: Medium     - now seeing CBT therapist Dr. Lanier Ensign  - 10/31/16: eval w Endocrine Dr. Eyvonne Mechanic [facility]): I believe this is a major contributor - we did have him do overnight oximetry with using CPAP and this did show continued desaturations that were very long periods of time when he was up and not able to fall asleep - he is seeing the sleep center      . Growth hormone deficiency 03/29/2016     Priority: Medium     - Jan 2019: eval w Dr. Madie Reno [facility]) see recs under adrenal insufficiency in problem list  -- 10/31/16: eval w Endocrine Dr. Tawanna Cooler [facility]): increase GH to 0.4 mg daily  - 08/26/16:  eval w Dr. Tawanna Cooler [facility]): suspect that some of these issues will improve longer he is on [facility] .Marland KitchenMarland Kitchen Therefore I am not concerned with him taking 7.5 mg -10 mg of prednisone a day or equivalent if that is what he needs in next several months.   - 07/11/16: eval w Dr. Tawanna Cooler [facility]): he did have very + response to hydrocortisone replacement  .[name] However he would lose effect after 4 hours. .. We have talked about options and could try prednisone with longer half life - swtch to t mg in AM and 1 mg between 3-5  pm and adjust as needed. He has been on finasteride for many years and give trial off of this. New rx for androgel packets. Thyroid function is excellent.   - 12/10/15: eval w Dr. Tawanna Cooler [facility]): no response to [facility] after glucagon stimulation. He does have some sx that could be explained by partial adrenal insufficiency ... I would like to proceed with a trial of glucocorticoid replacement now prior to starting [facility] to see if he has typical response we would see w cortisol replacement. ... If his s do not improve w hydrocortisone in replacement doses this can be . I think however we still would need to follow this given the unclear nature of the underlying pituitary process and the possibility that deficits may develop as time goes on.        [name]  Chronic back pain 11/09/2015     Priority: Medium     - [facility]: Recommend tramadol for severe breakthrough pain.  MRI of the cervical spine ordered.  Continue at-home exercises and stretches and physical therapy.  Follow-up in 4 weeks for further evaluation  - As of May 2018 pt reports he gets his pain medication and klonazepam from [facility]  - 07/01/16: note from [facility] Dr. Toni Amend: Pt informed I will no longer refill percocet, clonazepam, or provigil as there are no clear disease indications. Pt upset and feels he needs these meds for his CF/FMS. I advised pt that he will need to have either rheum or pain specialist provide if they feel they are indicated. Pt has shown no evidence of abuse, but also has been on meds for several years and has shown no benefit in my opinion. Pt is currently being treated for panhypopit and adrenal insuff, hypogonadism, and hypothyroidism as a result, as wellas, growth hormone deficiency. Further analysis suggests polypharmacy with potential drug interactions. The pt knows how to wean clonazepam and has asked for 30days of tramadol to wean from the percocet. Note he is only on a  half tablet of percocet qid. As a result will provide only 25mg  tabs to start qday and increase every third day to bid and tid max for 30 days total. No refills will be provided. Pt is aware.   - 2005: developed R hip and leg pain and was told he had "small central disk herniation" at L5-S1. Surgery was not recommended. He received PT and some epidural corticosteroid injections which improved but did not completely resolve the situation. Low back and leg pain recurred in 2006 and was again tx w steroid and PT.      [name] Chronic fatigue fibromyalgia syndrome 01/24/2013     Priority: Medium     In Aug 2006 he noted spontaneous onset of severe fatigue and myalgias. He had extensive evaluation for CT and autoimmune disease at [facility] . Ultimately he was discovered to have inadequate thyroid replacement as well as a low free testosterone. He was evaluated by endocrine ... Ultimately he was diagnosed with fibromyalgia / chronic fatigue syndrome for which he was prescribed various medications including provigil, cymbalta, gabapentin and pregabalin, and elavil (all w/o improvement).     - 09/08/17: evaluation with Rheum ([facility]): A/P #1 Pituitary insufficiency  #2 Secondary adrenal insufficiency  #3 Myalgias  #4 Sicca symptoms: My suspicion is that pt has an underlying rheumatologic process that accounts for his steroid responsive symptoms here is exceedingly low. The constellation of symptoms that he describes during a crisis is not typical for any specific autoimmune disease such as lupus, rheumatoid arthritis, vasculitis, or inflammatory myopathy. In particular, inflammatory myopathy would typically present with painless muscle weakness, and his muscle strength is preserved, and he is endorsing more myalgia, more than anything else. I reviewed pictures he brought in with him for episodes in his hands and feet, and I am at a bit of a loss as to what this is. It does not look like CRPS, erythemalgia, or  Raynaud's. I do note he has had extensive serologic testing here so far including ANA, ENAs, complements, all of which are within normal limits as are inflammatory markers. To complete serologic evaluation, I think we can check cryoglobulins, ANCAs, and a CCP antibody, and given back pain, which seems more myofascial than intra-articular or muscular, would recommend  just obtaining plain x-rays of spine as well as feet and ankles. He is on board with this. I will review the results with him on the portal.  - 06/22/17: He carries a dx from previous providers of chronic pain and fibromyalgia syndrome.  He has been extensively evaluated in past for this and other conditions by [facility].  He has another annual exam with [facility] coming up in next 5 mos.  He notes that he has had more difficulty with fatigue, generalized muscle pain and "brain fog."  He has submitted routine blood work to [facility] to monitor adrenal insufficiency and hypothyroidism and these are stable.  He feels "terrible" and has difficulty leaving house due to pain and fatigue.  He has tried a variety of meds in past to help with fibromyalgia wo specific improvement.  These are summarized below.  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  Currently he takes tramadol 25-50 mg once or twice daily and this helps but does not entirely alleviate pain.  Pain is worse in morning, improves somewhat through day.  Fatigue is always present.  He was recommended by psychiatrist and neurologist at [facility] to try Remeron and donepezil.  He did not tolerate these.  He wonders about additional treatment. Currently he feels like insomnia is better but fatigue and pain are worse. Despite extensive workup with many specialists in past at [facility] he wonders if there is some occult cause of symptoms which has not been identified.  - 10/08/15:  note from Dr.  Quinn Plowman [facility] Fibromyalgia and Chronic Fatigue): presentation is c/w fibromyalgia and chronic fatigue. Advised to complete Fibro/CFS treatment program  - 10/06/15 Note from Neurology Dr. Raynelle Fanning Hammack: In Aug 2006 noted spontaneous onset of severe fatigue and myalgias. He had extensive evaluation for CT and autoimmune disease. Ultimately he was discovered to have inadequate thyroid replacement as well as low free testosterone. He was eval by endocrine  ... Ultimately he was diagnosed with fibromyalgia and chronic fatigue syndrome for which he was prescribed various medications including provigil, cymbalta, gabapentin and pregabalin.      . Essential hypertension 01/24/2013     Priority: Medium     Treated with amlodipine and ramipril and coreg    - eval w Dr. Ova Freshwater (Nephor/HTN at [facility]): BP seems better, monitor home readings - not having significant autonomic dysfunction  -ECHO 10/27/16 [facility]: nl LV size and systolic function w EF of 65% - mild AV regurgitation and mild ascending aortic dilatation - recommend repeat ECHO in 12 months  - 10/26/16: eval w Dr. Ova Freshwater (Nephro/HTN at [facility]): recommend change amlodipine to 10 mg from 5 mg and take at night. Continue ramipril 20 mg. I would hesitate to start clonidine given dry eyes and dry mouth. I would hesitate to put him on diuretic given the adrenal insufficiency. I would therefore lead toward putting him on something like low dose alpha blocker like carvedilol. ... He is agreeable to try this. We will follow up next month.   - 10/25/16: eval w Dr. Allen Norris (Cardiology): mild AV regurge based on outside echo, repeat echo. Borderline mid ascending aortic dilatation. Recommend ECHO in 12 months for FU. Lipid profile appears appropriate     . Mixed hyperlipidemia 01/24/2013     Priority: Medium     Pt reports he has taken  statin for many years (was taken off to see if this would help with fibromyalgia pain / wo improvement) and zetia and tricor. As  of June 2019 not on tricor (concern with zetia and tricor contributing to risk of myopathy)    TC 177 TRI 237 HDL 54 LDL 76 02/14/18  TC 578 TRI 112 HDL 81 LDL 86 09/14/16 -          . Primary hypothyroidism 01/24/2013     Priority: Medium     - FT4 2.23 03/23/18 (H)  - 03/12/18: email from endocrinologist Dr. Madie Reno at [facility]: " You are correct regarding the thyroid replacement dose. The TSH is not reliable. All you really need in addition is free T4 which he states was normal.. You values here in the past have been around 1.5. If it is still in this range I would not change levothyroxine dose. Regarding testosterone, it would be good to get free testosterone or bioavailable testosterone along with total to be sure dose is correct. Regarding growth hormone, if you have only been off of it for a week or two, you can start back on same dose. If it has been longer you should probably start at 0.2 mg/day for 2 weeks then increase to 0.3 mg.   - 02/14/18: TSH 0.089 FT4 wnl    - T[facility] 09/04/17 [facility].09 [facility]  - 10/31/16: eval w Endocrine Dr. Tawanna Cooler [facility]): FT4 mildly elevated on 200 mcg day - decrease to 150 mcg daily and recheck FT4 in 8 weeks       . History of asthma 01/24/2013     Priority: Medium     - since childhood    - 11/23/16: eval w Dr. Renita Papa (Pulm at [facility]): bronchial asthma mild, allergic rhinitis postnasal drainage: uses albuterol BID for chest tightness. Check nitric oxide test. He has had intermittent eosinophilia and this points towards an eosinophilic asthma phenotype. He does have allergic rhinitis and think he would be better servied with our triple combination nose spray which includes mometasone, diphenhydramine and iptratroprium. I gave him rx for this.   - 10/2015 FEV1 94 FVC 82     . Chronic diarrhea 01/17/2017     Priority: Low     - eval w ID 11/21/16 Dr. Marcy Panning [facility]): has had extensive evaluation. I cannot identify an infectious disease that is likely to cause symptomatology. As he does  have some cognitive decline and chronic diarrhea I will be checking a whipple PCR on blood although I think this is highly unlikely as he does not also have joint symptoms. A duodenal aspirate would be needed to confirm or completely rule out this diagnosis. I will also be checking for heavy metals. Given the amount of fish he consumes I am expecting to have high levels organic arsenic which are not toxic. . I will also be testing for stronglyloides which can be found in [place] and [place]. I will call if any of these tests are positive.      . 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 James Ivanoff: "complicated situation. Recommend trying remeron 15 mg to help w sleep, mood and anxiety. ... If his sleep can be improved that could potentally improve his cognition. Try it for at least 6-8 weeks. He can let me know how he is doing via patient portal. I also recommended that he  try to simplify his medication and situations, meaning not using klonopin or percocet. I cautioned him on marijuana use. Recommend continuing w cognitive behavioral therapy and psychotherapy techniques.      . Amnestic MCI (mild cognitive impairment with memory loss) 11/20/2016     Priority: Low     - had dizziness on donepezil and remeron - Granby'd both - as of 01/18/17 - on half tab of donepezil 10 mg    - 05/22/18: eval w Neurology Dr. Nilda Calamity: at this point, based upon his history and exam I have no reason to suspect early dementia and I suspect that the [facility] physician was merely being thorough when he offered amyloid scanning.  He will send me the neuropsychological testing that was completed.  At this point, I am hoping that treatment of the fibromyalgia and insomnia will be helpful to him.  I will increase the gabapentin to 600-900 mg at night for both issues.  I recommended that he read over the notes from the fibromyalgia specialist, which  included multiple excellent recommendations and some explanations of the condition.  A short course of acyclovir was prescribed because what he believes is a herpes recurrence, but he should follow-up with his primary care physician about this.  We are avoiding opiates because of concern about central sensitization even if there is short-term benefit.  These would be a last resort because he does indicate that he functions on them in the past and he never escalated his dosage.  I will see him again in about a month.  - 04/11/18: eval w Neurology Dr. Harrel Lemon: At this point, based upon his history and exam.  I have no reason to suspect early dementia and I suspect that the [facility] physician was merely being thorough when he offered amyloid scanning.  I did not see neuropsychometric testing in the mail paperwork so I am not sure where the MCI diagnosis arose from.  Based on today's exam history of what suspect that this is more in line with a case of normal aging some degree of brain injury from the meningioma the effects of depression, fatigue, chronic pain and physical and mental deconditioning along with normal aging resulting a picture of reduced working memory and attention primarily.  I will try to retrieve any neuropsychometric testing results from May of the bites just otherwise.  Patient will be going for an endocrine evaluation at [facility] in the near future.  I suggested he follow up with me in a couple of months after having a chance to review his additional records.  Seems to me that pain control.  Something that is missing in his case.  In the [facility] is coming down with recommendations regarding treatment of fibromyalgia.  The focus on central sensitization and suggest avoiding opiates including tramadol.  He did function when he was on the Percocet by his reports.  At follow-up, I will inquire as to how he fared with some of the [facility] recommendations from the fibromyalgia  specialist.  Insomnia remains severe.  I will follow up on whether he has fared well with Remeron.  He has not tried trazodone.  Amitriptyline should be considered for its anti-insomnia and chronic pain, benefit, and I will ask him about this unless he has dry eyes and dry mouth symptoms.  The meningioma is being followed and I agree with the [facility] approach of watching and waiting as it may be calcified and no longer growing.  He will  follow-up with me in about 6 weeks..   - 11/24/16: eval w Neurology Dr. Betsey Holiday [facility]): He does have insomnia, and remeron just commenced which makes good sense. He may also benefit from cholinergic stimulation and therefore donepezil may be helpful here. .. We discussed potential role of amyloid PET testing. I will arrange an amyloid PET scan. If negative, early AD is effectively ruled out. Consider brain rehab program. Meet back after PET testing.   - 10/31/16: eval w Endocrine Dr. Eyvonne Mechanic [facility]): Dr. Earney Hamburg completed neuropsychologic testing and incidates he has very focal impairment in verbal memory and to a lesser extent in visual memory and indicates this is consistent with the amnestic variety of mild cognitive impairment. Neurology is arranging for further testing with MRI for cognitive dysfunction and brain PET scan.   - 10/28/16 eval w Dr Azucena Kuba [facility]): impaired performance in several measures of verbal learning and memory and some marginally poor performances in measures of nonverbal learning and memory. Marland KitchenMarland KitchenMarland KitchenThese results are significant and while neuropsychological testing in and of itself cannot diagnose any particular condition you would potentially benefit from further discussion, possibly with our colleagues in Neurology to determine if there is some underlying condition that may be contributing to these memory difficulties even beyond your history of brain tumor.      . Hemianopia, homonymous, left 09/15/2015     Priority: Low     -  09/04/17: eval w Dr. Jerre Simon [facility]): He has a left homonymous hemianopia from the prior occipital lobe meningioma and resection. I will get a baseline visual field since his last exam was 2 years ago. He reports dry eye. Prior SSA and SSB were negative. Schirmer's test today showed normal tear production. The dry eye is mostly from meibomian gland dysfunction and therefore unrelated to Sjogren's. His main visual complaint is blurred vision and tunnel vision during his "spells" that he attributes to low cortisol. He is currently being treated with cortisone and has minimal symptoms. A lack of cortisone would not typically cause visual symptoms, but if his blood pressure was dropping, this could be contributing. He is scheduled for autonomic testing later this week. Addendum: 24-2 Humphrey visual field showed a small left inferior homonymous scotoma  - evaluation w Ophthalmology Dr. Ferd Glassing /24/17:  this patient continues to do exceptionally well almost 13 years following resection of a right parietooccipital falcine meningioma. Prior to surgery, he had an inferior left homonymous defect that improved following surgery and has subsequently stabilized with minimal residual.Even though he has a moderate residual lesion abutting and occluding the posterior sagittal sinus, with slight compression of the medial aspect of the left occipital lobe, his examin today remains absolutely stable with no evidence of a worsening field defect and no evidence of papilledema or optic atrophy. He has a stable nevus in the right fundus. I will see him again in 1-2 years. In meantime, he has asked for a suggestion of an endocrinologist who deals with pituitary hormone dysfunction and he also may be interested in seeing a radiation therapist for discussion of stereotactic radiosurgery using a gamma knife (as opposed to the Cyberknife). I have recommended that he discuss these issues with [name] when he sees him tomorrow.     .  Secondary male hypogonadism 01/24/2013     Priority: Low     - - 10/31/16: eval w Endocrine Dr. Tawanna Cooler [facility]): testosterone level is excellent right now - continue current therapy         .  Marijuana use 07/12/2018   . Onychomycosis 02/13/2018   . Xerostomia 12/21/2017     eval w dentist in April 2019 and recommended for tx w evoxac     . Left testicular pain 09/13/2017     09/07/17 evaluation w Urology at [facility]: ASSESSMENT / PLAN: 1 Left Testicular Pain I had the pleasure meeting with Dr. [name] in consultation today. We discussed his current symptomatology. We discussed the results of his scrotal ultrasound which does not note any concerning pathology to account for the left-sided testicular pain. He has noted improvement with the use of scrotal support. I did discuss with him consideration of a referral to our [facility] colleagues, potential PT for pelvic floor assessment or question as to whether nerve block would be appropriate. At this point in time the patient would like to defer anything invasive and is reassured with his negative ultrasound imaging.             . Recurrent sinusitis 01/17/2017     - 11/21/16: eval w ENT Dr. Jannette Spanner [facility]): For recurrent sinusitis, he is on good medical regiment w daily nasal irrigations. Surgery would be indicated for 4-5 episodes of sinusitis per year. He was not interested in operative intervention and this is reasonable. In re to his deviated septum septoplasty could be considered. He was not interested at this time. To address his nasal obstruction on a mucosal basis I recommended triple spray. Avoid afrin. Offered him fluoroscopic eval of his dysphagia but he deferred. Would not recommend surgery for OSA.      [name] BPH (benign prostatic hyperplasia) 01/24/2013       The patient's past medical history, surgical history, medications and allergies were reviewed.    Allergies   Allergen Reactions   . Bextra [Valdecoxib] Rash       Medications  Current Outpatient  Medications   Medication Sig Dispense Refill   . albuterol (PROVENTIL HFA;VENTOLIN HFA) 108 (90 Base) MCG/ACT inhaler Inhale 1 puff into the lungs every 4 (four) hours as needed for Wheezing or Shortness of Breath. 18 g 6   . amLODIPine (NORVASC) 5 MG tablet TAKE ONE TABLET BY MOUTH TWICE DAILY  180 tablet 1   . BELBUCA 450 MCG Film      . budesonide-formoterol (SYMBICORT) 80-4.5 MCG/ACT inhaler Inhale 2 puffs into the lungs 2 (two) times daily 10.2 g 3   . Buprenorphine (BUTRANS) 15 MCG/HR Patch Weekly   0   . Calcium Carbonate-Vitamin D (CALCIUM + D PO) Take by mouth. 500/400mg   daily      . carvedilol (COREG) 6.25 MG tablet TAKE TWO TABLETS BY MOUTH TWICE DAILY WITH FOOD  360 tablet 1   . clonazePAM (KLONOPIN) 0.5 MG tablet Take 0.25 mg by mouth daily as needed.      2   . Coenzyme Q10 100 MG capsule Take 100 mg by mouth daily.     [name] ezetimibe (ZETIA) 10 MG tablet TAKE ONE TABLET BY MOUTH ONE TIME DAILY  90 tablet 2   . finasteride (PROSCAR) 5 MG tablet Take 1 tablet (5 mg total) by mouth daily 90 tablet 3   . gabapentin (NEURONTIN) 300 MG capsule Take 900 mg by mouth 3 (three) times daily        . hydrocortisone (CORTEF) 20 MG tablet Take 70 mg by mouth daily     . levothyroxine (SYNTHROID, LEVOTHROID) 150 MCG tablet Take 1 tablet daily at 6pm. 90 tablet 3   .  montelukast (SINGULAIR) 10 MG tablet TAKE ONE TABLET BY MOUTH AT BEDTIME  90 tablet 2   . Multiple Vitamins-Minerals (CENTRUM SILVER PO) Take 1 tablet by mouth daily.       . pilocarpine (SALAGEN) 5 MG tablet Take 1 tablet (5 mg total) by mouth 3 (three) times daily 90 tablet 0   . pyridoxine (B-6) 100 MG tablet Take 100 mg by mouth daily.     . ramipril (ALTACE) 10 MG capsule TAKE TWO CAPSULES BY MOUTH DAILY  180 capsule 2   . simvastatin (ZOCOR) 20 MG tablet Take 20 mg by mouth nightly     . Somatropin (NUTROPIN AQ NUSPIN 10) 10 MG/2ML Solution Inject 0.3 mg into the skin daily     . testosterone (ANDROGEL) 25 MG/2.5GM (1%) Gel 2.5 mg daily.      1   .  traMODol (ULTRAM-ER) 300 MG 24 hr tablet Take 100 mg by mouth daily     1   . VITAMIN D, CHOLECALCIFEROL, PO Take 5,000 Units by mouth daily.        . chlorhexidine (PERIDEX) 0.12 % solution      . metoclopramide (REGLAN) 5 MG tablet Take 1 tablet (5 mg total) by mouth 4 (four) times daily as needed (nausea) 24 tablet 0   . nystatin (MYCOSTATIN) 100000 UNIT/ML suspension SWISH IN MOUTH AND RETAIN FOR AS LONG AS POSSIBLE (SEVERAL MINUTES) BEFORE SWALLOWING 4 TIMES DAILY 473 mL 2     No current facility-administered medications for this visit.        Review of Systems  CONST: 10 lb WG, no fevers/chills sweats, fatigue at baseline, muscle aches  NOSE: No congestion, runny nose or bloody nose  MOUTH: No sore throat, oral lesions, difficulty swallowing  NECK: No swollen glands, stiffness or pain  CV: No chest pain, palpitations, leg swelling  GI: No n/v, diarrhea, constipation, abd pain,    Physical Exam  BP 164/83 (BP Site: Left arm, Patient Position: Sitting, Cuff Size: Medium)   Pulse (!) 58   Temp 97.5 F (36.4 C) (Oral)   Resp 15   Wt 87.5 kg (192 lb 12.8 oz)   SpO2 96%   BMI 27.66 kg/m   Wt Readings from Last 3 Encounters:   07/12/18 87.5 kg (192 lb 12.8 oz)   05/22/18 83.2 kg (183 lb 8 oz)   03/23/18 80.1 kg (176 lb 9.6 oz)     GEN: Overweight male alert and appropriate in no apparent distress  HENT: Tympanic membrane nl bilat, mild nasal congestion, oropharynx normal w no exudate or erythema  EYES: PERRL,  extra ocular movements intact, no pallor or scleral icterus, normal conjunctiva  NECK: supple, no thyromegaly or nodules appreciated  LYMPH: no cervical or supraclavicular lymphadenopathy appreciated  CV: regular rate and rhythm no m/r/g.  No lower extremity edema.  2+ radial pulses present and equal.  RESP: clear to auscultation bilaterally, normal effort, no rhonchi or rales    Assessment/Plan    1. History of asthma  Asthma exacerbation - previously well controlled - advised him to continue the  hydrocortisone at current dose for no more than 5 days then return to baseline levels of steroid. Replace flovent with symbicort. Recommend pulmonology evaluation and referral provided.   - budesonide-formoterol (SYMBICORT) 80-4.5 MCG/ACT inhaler; Inhale 2 puffs into the lungs 2 (two) times daily  Dispense: 10.2 g; Refill: 3    Gloriajean Dell, MD  [facility] [address]  [address]  [place]  [place], [place] [address]  P) [phone]  F) [phone]  [email or web address]

## 2018-07-15 ENCOUNTER — Other Ambulatory Visit: Payer: Self-pay | Admitting: Internal Medicine

## 2018-07-15 DIAGNOSIS — I10 Essential (primary) hypertension: Secondary | ICD-10-CM

## 2018-07-25 ENCOUNTER — Other Ambulatory Visit: Payer: Self-pay | Admitting: Internal Medicine

## 2018-07-25 DIAGNOSIS — K117 Disturbances of salivary secretion: Secondary | ICD-10-CM

## 2018-07-30 ENCOUNTER — Emergency Department
Admission: EM | Admit: 2018-07-30 | Discharge: 2018-07-30 | Disposition: A | Discharge status: Home / Self Care | Payer: Medicare Other | Attending: Emergency Medical Services | Admitting: Emergency Medical Services

## 2018-07-30 ENCOUNTER — Encounter: Payer: Self-pay | Admitting: Internal Medicine

## 2018-07-30 DIAGNOSIS — E860 Dehydration: Secondary | ICD-10-CM | POA: Insufficient documentation

## 2018-07-30 DIAGNOSIS — R112 Nausea with vomiting, unspecified: Secondary | ICD-10-CM | POA: Insufficient documentation

## 2018-07-30 LAB — URINALYSIS, REFLEX TO MICROSCOPIC EXAM IF INDICATED
Bilirubin, UA: NEGATIVE
Blood, UA: NEGATIVE
Glucose, UA: 150 — AB
Leukocyte Esterase, UA: NEGATIVE
Nitrite, UA: NEGATIVE
Protein, UR: NEGATIVE
Specific Gravity UA: 1.015 (ref 1.001–1.035)
Urine pH: 8 (ref 5.0–8.0)
Urobilinogen, UA: NORMAL mg/dL (ref 0.2–2.0)

## 2018-07-30 LAB — COMPREHENSIVE METABOLIC PANEL
ALT: 95 U/L — ABNORMAL HIGH (ref 0–55)
AST (SGOT): 51 U/L — ABNORMAL HIGH (ref 5–34)
Albumin/Globulin Ratio: 1.6 (ref 0.9–2.2)
Albumin: 4.7 g/dL (ref 3.5–5.0)
Alkaline Phosphatase: 73 U/L (ref 38–106)
Anion Gap: 15 (ref 5.0–15.0)
BUN: 23 mg/dL (ref 9–28)
Bilirubin, Total: 2.2 mg/dL — ABNORMAL HIGH (ref 0.2–1.2)
CO2: 25 mEq/L (ref 22–29)
Calcium: 10.6 mg/dL — ABNORMAL HIGH (ref 8.5–10.5)
Chloride: 100 mEq/L (ref 100–111)
Creatinine: 1.3 mg/dL (ref 0.7–1.3)
Globulin: 2.9 g/dL (ref 2.0–3.6)
Glucose: 104 mg/dL — ABNORMAL HIGH (ref 70–100)
Potassium: 3.7 mEq/L (ref 3.5–5.1)
Protein, Total: 7.6 g/dL (ref 6.0–8.3)
Sodium: 140 mEq/L (ref 136–145)

## 2018-07-30 LAB — CBC AND DIFFERENTIAL
Absolute NRBC: 0 10*3/uL (ref 0.00–0.00)
Basophils Absolute Automated: 0.08 10*3/uL (ref 0.00–0.08)
Basophils Automated: 0.6 %
Eosinophils Absolute Automated: 0.45 10*3/uL — ABNORMAL HIGH (ref 0.00–0.44)
Eosinophils Automated: 3.6 %
Hematocrit: 48 % (ref 37.6–49.6)
Hgb: 16.1 g/dL (ref 12.5–17.1)
Immature Granulocytes Absolute: 0.07 10*3/uL (ref 0.00–0.07)
Immature Granulocytes: 0.6 %
Lymphocytes Absolute Automated: 2.96 10*3/uL (ref 0.42–3.22)
Lymphocytes Automated: 23.8 %
MCH: 30 pg (ref 25.1–33.5)
MCHC: 33.5 g/dL (ref 31.5–35.8)
MCV: 89.6 fL (ref 78.0–96.0)
MPV: 10.7 fL (ref 8.9–12.5)
Monocytes Absolute Automated: 1.12 10*3/uL — ABNORMAL HIGH (ref 0.21–0.85)
Monocytes: 9 %
Neutrophils Absolute: 7.77 10*3/uL — ABNORMAL HIGH (ref 1.10–6.33)
Neutrophils: 62.4 %
Nucleated RBC: 0 /100 WBC (ref 0.0–0.0)
Platelets: 247 10*3/uL (ref 142–346)
RBC: 5.36 10*6/uL (ref 4.20–5.90)
RDW: 13 % (ref 11–15)
WBC: 12.45 10*3/uL — ABNORMAL HIGH (ref 3.10–9.50)

## 2018-07-30 LAB — GFR: EGFR: 54.7

## 2018-07-30 LAB — LIPASE: Lipase: 21 U/L (ref 8–78)

## 2018-07-30 MED ORDER — ONDANSETRON HCL 4 MG/2ML IJ SOLN
8.00 mg | Freq: Once | INTRAMUSCULAR | Status: AC
Start: 2018-07-30 — End: 2018-07-30
  Administered 2018-07-30: 8 mg via INTRAVENOUS
  Filled 2018-07-30: qty 4

## 2018-07-30 MED ORDER — ONDANSETRON 4 MG PO TBDP
4.00 mg | ORAL_TABLET | Freq: Four times a day (QID) | ORAL | 0 refills | Status: AC | PRN
Start: 2018-07-30 — End: 2018-08-06

## 2018-07-30 MED ORDER — PROCHLORPERAZINE MALEATE 10 MG PO TABS
10.0000 mg | ORAL_TABLET | Freq: Three times a day (TID) | ORAL | 0 refills | Status: AC | PRN
Start: 2018-07-30 — End: 2018-08-06

## 2018-07-30 MED ORDER — HYDROCORTISONE SOD SUC (PF) 100 MG IJ SOLR (WRAP)
50.00 mg | Freq: Once | INTRAMUSCULAR | Status: AC
Start: 2018-07-30 — End: 2018-07-30
  Administered 2018-07-30: 08:00:00 50 mg via INTRAVENOUS
  Filled 2018-07-30: qty 2

## 2018-07-30 MED ORDER — DEXTROSE-NACL 5-0.9 % IV BOLUS
1000.00 mL | Freq: Once | INTRAVENOUS | Status: AC
Start: 2018-07-30 — End: 2018-07-30
  Administered 2018-07-30: 1000 mL via INTRAVENOUS

## 2018-07-30 NOTE — ED Triage Notes (Signed)
Nausea and chills onset Saturday, +diarrhea.  Last episode 6 weeks ago.

## 2018-07-30 NOTE — EDIE (Signed)
COLLECTIVE?NOTIFICATION?07/30/2018 07:43?WALFRED, BETTENDORF A?MRN: [id number]    Criteria Met      Narx Scores Alert    Security and Safety  No recent Security Events currently on file    ED Care Guidelines  There are currently no ED Care Guidelines for this patient. Please check your facility's medical records system.        Prescription Monitoring Program  571??- Narcotic Use Score  320??- Sedative Use Score  000??- Stimulant Use Score  200??- Overdose Risk Score  - All Scores range from 000-999 with 75% of the population scoring < 200 and on 1% scoring above 650  - The last digit of the narcotic, sedative, and stimulant score indicates the number of active prescriptions of that type  - Higher Use scores correlate with increased prescribers, pharmacies, mg equiv, and overlapping prescriptions  - Higher Overdose Risk Scores correlate with increased risk of unintentional overdose death   Concerning or unexpectedly high scores should prompt a review of the PMP record; this does not constitute checking PMP for prescribing purposes.      E.D. Visit Count (12 mo.)  Facility Visits   [facility] 1   Total 1   Note: Visits indicate total known visits.      Recent Emergency Department Visit Summary  Date Facility [facility] Type Diagnoses or Chief Complaint   Jul 30, 2018 [facility] [place]      Vomiting          Recent Inpatient Visit Summary  No recorded inpatient visits.     Care Team  There is not a care team on record at this time.   Collective Portal  This patient has registered at the [facility] Emergency Department   For more information visit: [email or web address]     PLEASE NOTE:     1.   Any care recommendations and other clinical information are provided as guidelines or for historical purposes only, and providers should exercise their own clinical judgment when providing care.    2.   You may only use this  information for purposes of treatment, payment or health care operations activities, and subject to the limitations of applicable Collective Policies.    3.   You should consult directly with the organization that provided a care guideline or other clinical history with any questions about additional information or accuracy or completeness of information provided.    ? 2019 [place], [employer] - [email or web address]

## 2018-07-30 NOTE — Discharge Instructions (Signed)
Dehydration    You have been diagnosed with dehydration.    Dehydration is when your body is low on fluids (liquids). Dehydration has a variety of causes. These range from vomiting and diarrhea, to excessive (a lot of) sweating and poor appetite.    You have received intravenous (IV) fluids. These are to help fix your dehydration. It is important to keep hydrating yourself at home. Drink plenty of fluids frequently. Drink fluids that won't upset your stomach. This includes water and juice. It also includes drinks like Gatorade. Stay away from beverages like soda pop and tea. They may make you worse. Avoid caffeine and alcohol. They may cause you to lose more fluid.    YOU SHOULD SEEK MEDICAL ATTENTION IMMEDIATELY, EITHER HERE OR AT THE NEAREST EMERGENCY DEPARTMENT, IF ANY OF THE FOLLOWING OCCURS:   Fever (temperature higher than 100.4F / 38C) or shaking chills.   Constant vomiting and/or diarrhea.   Lightheadedness or fainting.   If you do not urinate (pee) for 8 or more hours.

## 2018-07-30 NOTE — ED Provider Notes (Signed)
Physician/Midlevel provider first contact with patient: 07/30/18 0749         History     Chief Complaint   Patient presents with   . Emesis     Patient had persistent nausea vomiting for the last 2 days.  This is a chronic issue for him.  Unfortunately he is developed a lot of chronic persistent medical issues meningioma which is been persistent has had prior resection adrenal insufficiency  Denies any abdominal pain he does have diarrhea but that is unrelated and per records appears to be chronic there is no fevers he had very poor oral intake in the last 24 hours despite nausea meds at home severe severity history per patient and wife      Past Medical History:   Diagnosis Date   . Avascular necrosis 2008    R hip - did not require surgery   . Cold intolerance    . Color blindness    . DJD (degenerative joint disease)     Lumbar / Sacral   . Dry eyes     uses restasis eyedrops which helps (RF, CCP, CRP and ESR have been wnl in past)   . Eczema 03/29/2016    Right thumb    . Encounter for hepatitis C screening test for low risk patient 08/2016    negative   . Encounter for pharmacogenetic testing 10/2015    [facility]   . Hemorrhoids    . Lateral epicondylitis     Left   . Osteoporosis    . Screening PSA (prostate specific antigen) 02/14/2018    0.4   . Tinnitus     high pitched buzzing comes and goes - started after craniotomy for meningioma       Past Surgical History:   Procedure Laterality Date   . COLONOSCOPY  2008   . EXCISION, LESION  12/03/2008    benign lesion   . ORCHIOPEXY Left 1980    for intermittent torsion   . Subtotal resection of posterior sagittal meningioma  12/05/2002    Partial brain [facility] secondary to [facility]ioma [facility]   . TONSILLECTOMY AND ADENOIDECTOMY  as a child   . TOOTH EXTRACTION Right 06/2018       Family History   Problem Relation Age of Onset   . Asthma Daughter    . Tuberculosis Mother    . Dementia Mother    . Migraines Sister        Social  Social History     Tobacco Use   .  Smoking status: Never Smoker   . Smokeless tobacco: Never Used   Substance Use Topics   . Alcohol use: No     Comment: rare   . Drug use: Yes     Comment: uses MJ for pain daily       .     Allergies   Allergen Reactions   . Bextra [Valdecoxib] Rash       Home Medications     Med List Status:  In Progress Set By: Doroteo Bradford, RN at 07/30/2018  7:58 AM                albuterol (PROVENTIL HFA;VENTOLIN HFA) 108 (90 Base) MCG/ACT inhaler     Inhale 1 puff into the lungs every 4 (four) hours as needed for Wheezing or Shortness of Breath.     amLODIPine (NORVASC) 5 MG tablet     TAKE ONE TABLET BY MOUTH TWICE DAILY  BELBUCA 450 MCG Film          budesonide-formoterol (SYMBICORT) 80-4.5 MCG/ACT inhaler     Inhale 2 puffs into the lungs 2 (two) times daily     Buprenorphine (BUTRANS) 15 MCG/HR Patch Weekly          Calcium Carbonate-Vitamin D (CALCIUM + D PO)     Take by mouth. 500/400mg   daily      carvedilol (COREG) 6.25 MG tablet     TAKE TWO TABLETS BY MOUTH TWICE DAILY WITH FOOD      chlorhexidine (PERIDEX) 0.12 % solution          clonazePAM (KLONOPIN) 0.5 MG tablet     Take 0.25 mg by mouth daily as needed.         Coenzyme Q10 100 MG capsule     Take 100 mg by mouth daily.     ezetimibe (ZETIA) 10 MG tablet     TAKE ONE TABLET BY MOUTH ONE TIME DAILY      finasteride (PROSCAR) 5 MG tablet     Take 1 tablet (5 mg total) by mouth daily     gabapentin (NEURONTIN) 300 MG capsule     Take 900 mg by mouth 3 (three) times daily        hydrocortisone (CORTEF) 20 MG tablet     Take 70 mg by mouth daily     levothyroxine (SYNTHROID, LEVOTHROID) 150 MCG tablet     Take 1 tablet daily at 6pm.     metoclopramide (REGLAN) 5 MG tablet     Take 1 tablet (5 mg total) by mouth 4 (four) times daily as needed (nausea)     montelukast (SINGULAIR) 10 MG tablet     TAKE ONE TABLET BY MOUTH AT BEDTIME      Multiple Vitamins-Minerals (CENTRUM SILVER PO)     Take 1 tablet by mouth daily.       nystatin (MYCOSTATIN) 100000 UNIT/ML  suspension     SWISH IN MOUTH AND RETAIN FOR AS LONG AS POSSIBLE (SEVERAL MINUTES) BEFORE SWALLOWING 4 TIMES DAILY     pilocarpine (SALAGEN) 5 MG tablet     TAKE ONE TABLET BY MOUTH THREE TIMES DAILY      pyridoxine (B-6) 100 MG tablet     Take 100 mg by mouth daily.     ramipril (ALTACE) 10 MG capsule     TAKE 2 CAPSULE BY MOUTH DAILY      simvastatin (ZOCOR) 20 MG tablet     Take 20 mg by mouth nightly     Somatropin (NUTROPIN AQ NUSPIN 10) 10 MG/2ML Solution     Inject 0.3 mg into the skin daily     testosterone (ANDROGEL) 25 MG/2.5GM (1%) Gel     2.5 mg daily.         traMODol (ULTRAM-ER) 300 MG 24 hr tablet     Take 100 mg by mouth daily        VITAMIN D, CHOLECALCIFEROL, PO     Take 5,000 Units by mouth daily.              Review of Systems   Constitutional: Negative for fever.   HENT: Negative for congestion.    Respiratory: Negative for shortness of breath.    Cardiovascular: Negative for chest pain.   Gastrointestinal: Positive for diarrhea and vomiting. Negative for abdominal pain.   Genitourinary: Negative for dysuria.   Musculoskeletal: Negative.    Skin: Negative for rash.  Neurological: Negative for headaches.   Psychiatric/Behavioral: Positive for confusion. Negative for dysphoric mood.   All other systems reviewed and are negative.      Physical Exam    BP: 172/86, Heart Rate: 78, Temp: 97.7 F (36.5 C), Resp Rate: 20, SpO2: 99 %, Weight: 82.3 kg     Physical Exam  Constitutional:       Appearance: He is ill-appearing.      Comments: Dehydrated     HENT:      Head: Normocephalic.      Mouth/Throat:      Mouth: Mucous membranes are dry.   Eyes:      Conjunctiva/sclera: Conjunctivae normal.   Neck:      Comments: Normal Inspection  Cardiovascular:      Rate and Rhythm: Normal rate and regular rhythm.      Heart sounds: Normal heart sounds.   Pulmonary:      Breath sounds: Normal breath sounds.   Abdominal:      General: Bowel sounds are normal.      Palpations: Abdomen is soft.      Tenderness:  There is no tenderness.   Musculoskeletal:      Comments: Normal Upper Extremity Inspection   Skin:     General: Skin is warm and dry.   Neurological:      Mental Status: He is alert.           MDM and ED Course     ED Medication Orders (From admission, onward)    Start Ordered     Status Ordering Provider    07/30/18 0804 07/30/18 0803  dextrose 5 % and 0.9% NaCl bolus 1,000 mL  Once     Route: Intravenous  Ordered Dose: 1,000 mL     Last MAR action:  Given Chase Picket    07/30/18 0804 07/30/18 0803  ondansetron (ZOFRAN) injection 8 mg  Once     Route: Intravenous  Ordered Dose: 8 mg     Last MAR action:  Given Chase Picket    07/30/18 0803 07/30/18 0803  hydrocortisone sodium succinate (Solu-CORTEF) injection 50 mg  Once     Route: Intravenous  Ordered Dose: 50 mg     Last MAR action:  Given Marjon Doxtater J             MDM           Repeat neuro imaging at this time they were comfortable with this.  He is now taking fluids and feels better after a liter of saline offered additional fluids patient declined will try Zofran also added Compazine as needed as previous meds have not worked he did get additional steroids this morning.  Has a chronic history of this wife reports actually every 6 weeks or so this occurs often requiring IV fluids      Procedures    Clinical Impression & Disposition     Clinical Impression  Final diagnoses:   Dehydration        ED Disposition     ED Disposition Condition Date/Time Comment    Discharge  Mon Jul 30, 2018 10:42 AM Sahir Enid Derry, MD discharge to home/self care.    Condition at disposition: Stable           New Prescriptions    ONDANSETRON (ZOFRAN ODT) 4 MG DISINTEGRATING TABLET    Take 1-2 tablets (4-8 mg total) by mouth every 6 (six) hours as needed for Nausea  PROCHLORPERAZINE (COMPAZINE) 10 MG TABLET    Take 1 tablet (10 mg total) by mouth every 8 (eight) hours as needed (nausea)                 Chase Picket, MD  07/30/18 1042

## 2018-07-31 ENCOUNTER — Other Ambulatory Visit: Payer: Self-pay | Admitting: Internal Medicine

## 2018-07-31 DIAGNOSIS — J4540 Moderate persistent asthma, uncomplicated: Secondary | ICD-10-CM

## 2018-08-09 ENCOUNTER — Emergency Department
Admission: EM | Admit: 2018-08-09 | Discharge: 2018-08-09 | Disposition: A | Discharge status: Home / Self Care | Payer: Medicare Other | Attending: Emergency Medicine | Admitting: Emergency Medicine

## 2018-08-09 DIAGNOSIS — R1110 Vomiting, unspecified: Secondary | ICD-10-CM | POA: Insufficient documentation

## 2018-08-09 LAB — HEPATIC FUNCTION PANEL
ALT: 110 U/L — ABNORMAL HIGH (ref 0–55)
AST (SGOT): 65 U/L — ABNORMAL HIGH (ref 5–34)
Albumin/Globulin Ratio: 1.5 (ref 0.9–2.2)
Albumin: 4.3 g/dL (ref 3.5–5.0)
Alkaline Phosphatase: 71 U/L (ref 38–106)
Bilirubin Direct: 0.4 mg/dL (ref 0.0–0.5)
Bilirubin Indirect: 0.8 mg/dL (ref 0.2–1.0)
Bilirubin, Total: 1.2 mg/dL (ref 0.2–1.2)
Globulin: 2.8 g/dL (ref 2.0–3.6)
Protein, Total: 7.1 g/dL (ref 6.0–8.3)

## 2018-08-09 LAB — CBC AND DIFFERENTIAL
Absolute NRBC: 0 10*3/uL (ref 0.00–0.00)
Basophils Absolute Automated: 0.07 10*3/uL (ref 0.00–0.08)
Basophils Automated: 0.7 %
Eosinophils Absolute Automated: 0.37 10*3/uL (ref 0.00–0.44)
Eosinophils Automated: 3.7 %
Hematocrit: 48.8 % (ref 37.6–49.6)
Hgb: 16.2 g/dL (ref 12.5–17.1)
Immature Granulocytes Absolute: 0.05 10*3/uL (ref 0.00–0.07)
Immature Granulocytes: 0.5 %
Lymphocytes Absolute Automated: 3.86 10*3/uL — ABNORMAL HIGH (ref 0.42–3.22)
Lymphocytes Automated: 39.1 %
MCH: 30.2 pg (ref 25.1–33.5)
MCHC: 33.2 g/dL (ref 31.5–35.8)
MCV: 91 fL (ref 78.0–96.0)
MPV: 11 fL (ref 8.9–12.5)
Monocytes Absolute Automated: 0.87 10*3/uL — ABNORMAL HIGH (ref 0.21–0.85)
Monocytes: 8.8 %
Neutrophils Absolute: 4.66 10*3/uL (ref 1.10–6.33)
Neutrophils: 47.2 %
Nucleated RBC: 0 /100 WBC (ref 0.0–0.0)
Platelets: 299 10*3/uL (ref 142–346)
RBC: 5.36 10*6/uL (ref 4.20–5.90)
RDW: 12 % (ref 11–15)
WBC: 9.88 10*3/uL — ABNORMAL HIGH (ref 3.10–9.50)

## 2018-08-09 LAB — BASIC METABOLIC PANEL
Anion Gap: 14 (ref 5.0–15.0)
BUN: 19 mg/dL (ref 9–28)
CO2: 24 mEq/L (ref 22–29)
Calcium: 10.1 mg/dL (ref 8.5–10.5)
Chloride: 102 mEq/L (ref 100–111)
Creatinine: 1.2 mg/dL (ref 0.7–1.3)
Glucose: 96 mg/dL (ref 70–100)
Potassium: 4.2 mEq/L (ref 3.5–5.1)
Sodium: 140 mEq/L (ref 136–145)

## 2018-08-09 LAB — LIPASE: Lipase: 18 U/L (ref 8–78)

## 2018-08-09 LAB — GFR: EGFR: 60

## 2018-08-09 MED ORDER — HYDROCORTISONE SOD SUC (PF) 100 MG IJ SOLR (WRAP)
INTRAMUSCULAR | Status: AC
Start: 2018-08-09 — End: 2018-08-09
  Filled 2018-08-09: qty 2

## 2018-08-09 MED ORDER — ONDANSETRON HCL 4 MG/2ML IJ SOLN
INTRAMUSCULAR | Status: AC
Start: 2018-08-09 — End: 2018-08-09
  Filled 2018-08-09: qty 2

## 2018-08-09 NOTE — Discharge Instructions (Signed)
Dear [name], MD:    I appreciate your choosing the [facility] for your healthcare needs, and hope your visit today was EXCELLENT.    Instructions:  Please follow-up with Dr. Jodie Echevaria, gastroenterologist, in 2-3 days.    Return to the Emergency Department for any worsening symptoms or concerns.    Below is some information that our patients often find helpful.    We wish you good health and please do not hesitate to contact us if we can ever be of any assistance.    Sincerely,  Venancio Poisson, MD  [facility]    ________________________________________________________________    If you do not continue to improve or your condition worsens, please contact your doctor or return immediately to the Emergency Department.    Thank you for choosing [facility] for your emergency care needs.  We strive to provide EXCELLENT care to you and your family.      DOCTOR REFERRALS  Call [phone] if you need any further referrals and we can help you find a primary care doctor or specialist.  Also, available online at:  [email or web address]    YOUR CONTACT INFORMATION  Before leaving please check with registration to make sure we have an up-to-date contact number.  You can call registration at [phone] to update your information.  For questions about your hospital bill, please call [phone].  For questions about your Emergency Dept Physician bill please call [phone].      FREE HEALTH SERVICES  If you need help with health or social services, please call 2-1-1 for a free referral to resources in your area.  2-1-1 is a free service connecting people with information on health insurance, free clinics, pregnancy, mental health, dental care, food assistance, housing, and substance abuse counseling.  Also, available online at:  http://www.[facility].org    MEDICAL RECORDS AND TESTS  Certain laboratory test results do not come back  the same day, for example urine cultures.   We will contact you if other important findings are noted.  Radiology films are often reviewed again to ensure accuracy.  If there is any discrepancy, we will notify you.      Please call [phone] to pick up a complimentary CD of any radiology studies performed.  If you or your doctor would like to request a copy of your medical records, please call [phone].      ORTHOPEDIC INJURY   Please know that significant injuries can exist even when an initial x-Jonanthan is read as normal or negative.  This can occur because some fractures (broken bones) are not initially visible on x-rays.  For this reason, close outpatient follow-up with your primary care doctor or bone specialist (orthopedist) is required.    MEDICATIONS AND FOLLOWUP  Please be aware that some prescription medications can cause drowsiness.  Use caution when driving or operating machinery.    The examination and treatment you have received in our Emergency Department is provided on an emergency basis, and is not intended to be a substitute for your primary care physician.  It is important that your doctor checks you again and that you report any new or remaining problems at that time.      24 HOUR PHARMACIES  [facility] - [address], [place], [place] [address] (1.4 miles, 7 minutes)  [facility] - [address] [place], [place] [address] (6.5 miles, 13 minutes)  Handout with directions  available on request    PATIENT RELATIONS  If you have any concerns, issues, or feedback related to your care, positive or negative, please do not hesitate to contact Patient Relations at [phone]. They are open from 8:30AM-5:00PM Monday through Friday.

## 2018-08-09 NOTE — ED Notes (Signed)
Bed: A12  Expected date:   Expected time:   Means of arrival:   Comments:  R.Elmore (3) vomiting

## 2018-08-09 NOTE — ED Provider Notes (Signed)
Physician/Midlevel provider first contact with patient: 08/09/18 0230         [facility] EMERGENCY DEPARTMENT HISTORY AND PHYSICAL EXAM    Patient Name: Shannon Neighbor, Shannon [name]  Encounter Date:  08/09/2018  Rendering Provider: Venancio Poisson, Shannon [name]  Patient DOB:  03-29-1951  MRN:  [id number]    History of Presenting Illness     Historian: Pt's wife    66 y.o. male with h/o adrenal and pituitary insufficiency, avascular necrosis, DJD, and hemorrhoids p/w acute onset of vomiting beginning 3 AM (30 min PTA). Associated with nausea. Pt's wife states pt has been dry-heaving as well. Of note, pt was seen here 07/30/18 for same sxs.    Pt's wife reports pt takes Hydrocortisone for adrenal insufficiency.    PMD:  Modesto Charon, Shannon [name]    Past Medical History     Past Medical History:   Diagnosis Date   . Avascular necrosis 2008    R hip - did not require surgery   . Cold intolerance    . Color blindness    . DJD (degenerative joint disease)     Lumbar / Sacral   . Dry eyes     uses restasis eyedrops which helps (RF, CCP, CRP and ESR have been wnl in past)   . Eczema 03/29/2016    Right thumb    . Encounter for hepatitis C screening test for low risk patient 08/2016    negative   . Encounter for pharmacogenetic testing 10/2015    MEDIMAP   . Hemorrhoids    . Lateral epicondylitis     Left   . Osteoporosis    . Screening PSA (prostate specific antigen) 02/14/2018    0.4   . Tinnitus     high pitched buzzing comes and goes - started after craniotomy for meningioma       Past Surgical History     Past Surgical History:   Procedure Laterality Date   . COLONOSCOPY  2008   . EXCISION, LESION  12/03/2008    benign lesion   . ORCHIOPEXY Left 1980    for intermittent torsion   . Subtotal resection of posterior sagittal meningioma  12/05/2002    Partial brain resection secondary to meningioma at [facility]   . TONSILLECTOMY AND ADENOIDECTOMY  as a child   . TOOTH EXTRACTION Right 06/2018       Family History     Family History   Problem  Relation Age of Onset   . Asthma Daughter    . Tuberculosis Mother    . Dementia Mother    . Migraines Sister        Social History     Social History     Socioeconomic History   . Marital status: Married     Spouse name: Not on file   . Number of children: Not on file   . Years of education: Not on file   . Highest education level: Not on file   Occupational History   . Not on file   Social Needs   . Financial resource strain: Not on file   . Food insecurity:     Worry: Not on file     Inability: Not on file   . Transportation needs:     Medical: Not on file     Non-medical: Not on file   Tobacco Use   . Smoking status: Never Smoker   . Smokeless tobacco: Never Used   Substance  and Sexual Activity   . Alcohol use: No     Comment: rare   . Drug use: Yes     Comment: uses MJ for pain daily   . Sexual activity: Not on file   Lifestyle   . Physical activity:     Days per week: Not on file     Minutes per session: Not on file   . Stress: Not on file   Relationships   . Social connections:     Talks on phone: Not on file     Gets together: Not on file     Attends religious service: Not on file     Active member of club or organization: Not on file     Attends meetings of clubs or organizations: Not on file     Relationship status: Not on file   . Intimate partner violence:     Fear of current or ex partner: Not on file     Emotionally abused: Not on file     Physically abused: Not on file     Forced sexual activity: Not on file   Other Topics Concern   . Not on file   Social History Narrative    He lives in [place] and is from [place]. Married - wife Shannon [name]. Two daughters (one lives in [place] and one in [place]). Retired Web designer - retired in 2016.       Home Medications     Home medications reviewed by ED Shannon [name]     Discharge Medication List as of 08/09/2018  3:44 AM      CONTINUE these medications which have NOT CHANGED    Details   albuterol (PROVENTIL HFA;VENTOLIN HFA) 108 (90 Base) MCG/ACT inhaler Inhale 1 puff into  the lungs every 4 (four) hours as needed for Wheezing or Shortness of Breath., Starting Fri 10/13/2017, Normal      amLODIPine (NORVASC) 5 MG tablet TAKE ONE TABLET BY MOUTH TWICE DAILY , Normal      BELBUCA 450 MCG Film Starting Wed 07/11/2018, Historical Med      budesonide-formoterol (SYMBICORT) 80-4.5 MCG/ACT inhaler Inhale 2 puffs into the lungs 2 (two) times daily, Starting Thu 07/12/2018, Normal      Buprenorphine (BUTRANS) 15 MCG/HR Patch Weekly Starting Thu 06/28/2018, Historical Med      Calcium Carbonate-Vitamin D (CALCIUM + D PO) Take by mouth. 500/400mg   daily , Until Discontinued, Historical Med      carvedilol (COREG) 6.25 MG tablet TAKE TWO TABLETS BY MOUTH TWICE DAILY WITH FOOD , Normal      chlorhexidine (PERIDEX) 0.12 % solution Starting Tue 07/03/2018, Historical Med      clonazePAM (KLONOPIN) 0.5 MG tablet Take 0.25 mg by mouth daily as needed.    , Starting Fri 11/25/2016, Historical Med      Coenzyme Q10 100 MG capsule Take 100 mg by mouth daily., Until Discontinued, Historical Med      ezetimibe (ZETIA) 10 MG tablet TAKE ONE TABLET BY MOUTH ONE TIME DAILY , Normal      finasteride (PROSCAR) 5 MG tablet Take 1 tablet (5 mg total) by mouth daily, Starting Sun 04/22/2018, Normal      gabapentin (NEURONTIN) 300 MG capsule Take 900 mg by mouth 3 (three) times daily   , Historical Med      hydrocortisone (CORTEF) 20 MG tablet Take 70 mg by mouth daily, Historical Med      levothyroxine (SYNTHROID, LEVOTHROID) 150 MCG tablet Take 1 tablet  daily at 6pm., Normal      metoclopramide (REGLAN) 5 MG tablet Take 1 tablet (5 mg total) by mouth 4 (four) times daily as needed (nausea), Starting Tue 02/13/2018, Normal      montelukast (SINGULAIR) 10 MG tablet Take one tablet by mouth at bedtime as directed, Normal      Multiple Vitamins-Minerals (CENTRUM SILVER PO) Take 1 tablet by mouth daily.  , Until Discontinued, Historical Med      nystatin (MYCOSTATIN) 100000 UNIT/ML suspension SWISH IN MOUTH AND RETAIN FOR  AS LONG AS POSSIBLE (SEVERAL MINUTES) BEFORE SWALLOWING 4 TIMES DAILY, Normal      pilocarpine (SALAGEN) 5 MG tablet TAKE ONE TABLET BY MOUTH THREE TIMES DAILY , Normal      pyridoxine (B-6) 100 MG tablet Take 100 mg by mouth daily., Until Discontinued, Historical Med      ramipril (ALTACE) 10 MG capsule TAKE 2 CAPSULE BY MOUTH DAILY , Normal      simvastatin (ZOCOR) 20 MG tablet Take 20 mg by mouth nightly, Historical Med      Somatropin (NUTROPIN AQ NUSPIN 10) 10 MG/2ML Solution Inject 0.3 mg into the skin daily, Historical Med      testosterone (ANDROGEL) 25 MG/2.5GM (1%) Gel 2.5 mg daily.    , Starting Tue 10/04/2016, Historical Med      traMODol (ULTRAM-ER) 300 MG 24 hr tablet Take 100 mg by mouth daily   , Starting Mon 01/22/2018, Historical Med      VITAMIN D, CHOLECALCIFEROL, PO Take 5,000 Units by mouth daily.   , Until Discontinued, Historical Med             Review of Systems     Constitutional:  No fever  Eyes: No discharge   ENT: No ST  CV:  No CP   Resp:  No SOB or cough  GI: +Vomiting, +Nausea, +Dry-heaving, No abd pain or diarrhea  GU: No dysuria  Skin: No rash  Neuro:  No HA  Psych:  No behavior changes  All other systems reviewed and negative    Physical Exam     BP 151/72   Pulse 79   Temp 98.4 F (36.9 C)   Resp (!) 40   SpO2 100%     CONSTITUTIONAL: Nauseated, Vital signs reviewed, Well appearing, Alert and oriented X 3.  HEAD: Atraumatic, Normocephalic.  EYES: Eyes are normal to inspection, Sclera are normal, Conjunctiva are normal.  ENT: Ears normal to inspection, Nose examination normal.  NECK: Normal ROM.  RESPIRATORY CHEST: Breath sounds normal, No respiratory distress.  CARDIOVASCULAR: RRR, No murmurs, Normal S1 S2.  ABDOMEN: Abdomen is nontender, No distension.  UPPER EXTREMITY: Inspection normal, Normal range of motion.  NEURO: GCS Is 15, No focal motor deficits.  SKIN: Skin is warm, Skin Is dry.  PSYCHIATRIC: Oriented X 3, Normal affect.    ED Medications Administered     ED  Medication Orders (From admission, onward)    Start Ordered     Status Ordering Provider    08/09/18 0239 08/09/18 0239  ondansetron (ZOFRAN) 4 MG/2ML injection     Note to Pharmacy:  Erma Heritage: cabinet override    Last MAR action:  Given     08/09/18 0239 08/09/18 0239  hydrocortisone sodium succinate (Solu-CORTEF) 100 MG injection     Note to Pharmacy:  Erma Heritage: cabinet override    Last MAR action:  Given           Orders Placed During  This Encounter     Orders Placed This Encounter   Procedures   . Lipase   . Hepatic function panel (LFT)   . Basic Metabolic Panel   . CBC and differential   . GFR         Diagnostic Study Results     Labs  Results     Procedure Component Value Units Date/Time    CBC and differential [[id number]]  (Abnormal) Collected:  08/09/18 0230     Updated:  08/09/18 0337     WBC 9.88 x10 3/uL      Hgb 16.2 g/dL      Hematocrit 40.9 %      Platelets 299 x10 3/uL      RBC 5.36 x10 6/uL      MCV 91.0 fL      MCH 30.2 pg      MCHC 33.2 g/dL      RDW 12 %      MPV 11.0 fL      Neutrophils 47.2 %      Lymphocytes Automated 39.1 %      Monocytes 8.8 %      Eosinophils Automated 3.7 %      Basophils Automated 0.7 %      Immature Granulocyte 0.5 %      Nucleated RBC 0.0 /100 WBC      Neutrophils Absolute 4.66 x10 3/uL      Abs Lymph Automated 3.86 x10 3/uL      Abs Mono Automated 0.87 x10 3/uL      Abs Eos Automated 0.37 x10 3/uL      Absolute Baso Automated 0.07 x10 3/uL      Absolute Immature Granulocyte 0.05 x10 3/uL      Absolute NRBC 0.00 x10 3/uL     GFR [[id number]] Collected:  08/09/18 0230     Updated:  08/09/18 0335     EGFR 60.0    Lipase [[id number]] Collected:  08/09/18 0230     Updated:  08/09/18 0335     Lipase 18 U/L     Hepatic function panel (LFT) [[id number]]  (Abnormal) Collected:  08/09/18 0230     Updated:  08/09/18 0335     Bilirubin, Total 1.2 mg/dL      Bilirubin, Direct 0.4 mg/dL      Bilirubin, Indirect 0.8 mg/dL      AST (SGOT) 65 U/L      ALT 110 U/L       Alkaline Phosphatase 71 U/L      Protein, Total 7.1 g/dL      Albumin 4.3 g/dL      Globulin 2.8 g/dL      Albumin/Globulin Ratio 1.5    Basic Metabolic Panel [[id number]] Collected:  08/09/18 0230     Updated:  08/09/18 0335     Glucose 96 mg/dL      BUN 19 mg/dL      Creatinine 1.2 mg/dL      Calcium 01.0 mg/dL      Sodium 272 mEq/L      Potassium 4.2 mEq/L      Chloride 102 mEq/L      CO2 24 mEq/L      Anion Gap 14.0        Labs Reviewed   HEPATIC FUNCTION PANEL - Abnormal; Notable for the following components:       Result Value    AST (SGOT) 65 (*)     ALT 110 (*)  All other components within normal limits   CBC AND DIFFERENTIAL - Abnormal; Notable for the following components:    WBC 9.88 (*)     Abs Lymph Automated 3.86 (*)     Abs Mono Automated 0.87 (*)     All other components within normal limits   LIPASE   BASIC METABOLIC PANEL   GFR         Radiologic Studies  Radiology Results (24 Hour)     ** No results found for the last 24 hours. **            Monitors, EKG     EKG (interpreted by ED physician): na    Injury Mechanism               Critical Care     None     Clinical Course / MDM     Working Differential (not completely inclusive):   Shannon Enid Derry, Shannon [name] is a 67 y.o. male with h/o adrenal insufficiency who presents with vomiting. Will give IV fluids, Zofran and stress dose solu-cortef.    Notes:     3:27 AM- Pt feeling better. Has received 1-L IV fluid.    3:40 AM-  Pt ready for d/c.    Discussion of abnormal results/incidental findings:         Data Review     Nursing records reviewed and agree: Yes    Laboratory results reviewed by ED provider (yes/no): yes    Radiologic study results reviewed by ED provider (yes/no): no    I, Shannon Poisson, Shannon [name], personally performed the services documented. Shannon [name] is scribing for me on Shannon Neighbor, Shannon [name]. I reviewed and confirm the accuracy of the information in this medical record.    Shannon [name], am serving as a Neurosurgeon to document services  personally performed by Shannon Poisson, Shannon [name], based on the provider's statements to me.     Credentials: Shannon [name], scribe    Rendering Provider: Venancio Poisson, Shannon [name]        Diagnosis and Disposition     Clinical Impression  1. Vomiting, intractability of vomiting not specified, presence of nausea not specified, unspecified vomiting type        Disposition  ED Disposition     ED Disposition Condition Date/Time Comment    Discharge  Thu Aug 09, 2018  3:44 AM Jaymz Enid Derry, Shannon [name] discharge to home/self care.    Condition at disposition: Stable          Prescriptions       Discharge Medication List as of 08/09/2018  3:44 AM                 Shannon Poisson, Shannon [name]  08/09/18 [phone]

## 2018-08-09 NOTE — EDIE (Signed)
COLLECTIVE?NOTIFICATION?08/09/2018 03:25?CLEVEN, JANSMA A?MRN: [id number]    Criteria Met      Narx Scores Alert    Security and Safety  No recent Security Events currently on file    ED Care Guidelines  There are currently no ED Care Guidelines for this patient. Please check your facility's medical records system.        Prescription Monitoring Program  592??- Narcotic Use Score  320??- Sedative Use Score  000??- Stimulant Use Score  190??- Overdose Risk Score  - All Scores range from 000-999 with 75% of the population scoring < 200 and on 1% scoring above 650  - The last digit of the narcotic, sedative, and stimulant score indicates the number of active prescriptions of that type  - Higher Use scores correlate with increased prescribers, pharmacies, mg equiv, and overlapping prescriptions  - Higher Overdose Risk Scores correlate with increased risk of unintentional overdose death   Concerning or unexpectedly high scores should prompt a review of the PMP record; this does not constitute checking PMP for prescribing purposes.      E.D. Visit Count (12 mo.)  Facility Visits   [facility] 2   Total 2   Note: Visits indicate total known visits.      Recent Emergency Department Visit Summary  Date Facility [facility] Type Diagnoses or Chief Complaint   Aug 09, 2018 Tyson Babinski [facility]      nausea      Jul 30, 2018 Tyson Babinski [facility]      Vomiting      Emesis      Dehydration          Recent Inpatient Visit Summary  No recorded inpatient visits.     Care Team  There is not a care team on record at this time.   Collective Portal  This patient has registered at the [facility] Emergency Department   For more information visit: [email or web address]     PLEASE NOTE:     1.   Any care recommendations and other clinical information are provided as guidelines or for historical purposes only, and providers  should exercise their own clinical judgment when providing care.    2.   You may only use this information for purposes of treatment, payment or health care operations activities, and subject to the limitations of applicable Collective Policies.    3.   You should consult directly with the organization that provided a care guideline or other clinical history with any questions about additional information or accuracy or completeness of information provided.    ? 2019 [place], [employer]. - [email or web address]

## 2018-08-24 ENCOUNTER — Other Ambulatory Visit: Payer: Self-pay | Admitting: Neurology

## 2018-08-24 DIAGNOSIS — D329 Benign neoplasm of meninges, unspecified: Secondary | ICD-10-CM

## 2018-08-25 ENCOUNTER — Ambulatory Visit
Admission: RE | Admit: 2018-08-25 | Discharge: 2018-08-25 | Disposition: A | Discharge status: Home / Self Care | Payer: Medicare Other | Source: Ambulatory Visit | Attending: Neurology | Admitting: Neurology

## 2018-08-25 ENCOUNTER — Other Ambulatory Visit: Payer: Self-pay | Admitting: Neurology

## 2018-08-25 DIAGNOSIS — G9389 Other specified disorders of brain: Secondary | ICD-10-CM | POA: Insufficient documentation

## 2018-08-25 DIAGNOSIS — D329 Benign neoplasm of meninges, unspecified: Secondary | ICD-10-CM

## 2018-08-25 DIAGNOSIS — D320 Benign neoplasm of cerebral meninges: Secondary | ICD-10-CM | POA: Insufficient documentation

## 2018-08-25 LAB — WHOLE BLOOD CREATININE WITH GFR POCT
GFR POCT: >60 mL/min/{1.73_m2} (ref >60)
Whole Blood Creatinine POCT: 0.7 mg/dL (ref 0.5–1.1)

## 2018-08-25 MED ORDER — GADOBUTROL 1 MMOL/ML IV SOLN
8.00 mL | Freq: Once | INTRAVENOUS | Status: AC | PRN
Start: 2018-08-25 — End: 2018-08-25
  Administered 2018-08-25: 8 mmol via INTRAVENOUS
  Filled 2018-08-25: qty 10

## 2018-08-30 ENCOUNTER — Telehealth: Payer: Self-pay

## 2018-08-30 NOTE — Telephone Encounter (Signed)
Request records faxed over to [facility] to fax [phone]. Most recent office note, labs, echocardiogram faxed over.

## 2018-09-04 ENCOUNTER — Encounter: Payer: Self-pay | Admitting: Internal Medicine

## 2018-09-04 ENCOUNTER — Ambulatory Visit (INDEPENDENT_AMBULATORY_CARE_PROVIDER_SITE_OTHER): Payer: Self-pay | Admitting: Cardiovascular Disease

## 2018-09-04 DIAGNOSIS — J45909 Unspecified asthma, uncomplicated: Secondary | ICD-10-CM | POA: Insufficient documentation

## 2018-09-05 ENCOUNTER — Other Ambulatory Visit: Payer: Self-pay | Admitting: Internal Medicine

## 2018-09-05 ENCOUNTER — Other Ambulatory Visit: Payer: Self-pay

## 2018-09-05 DIAGNOSIS — R739 Hyperglycemia, unspecified: Secondary | ICD-10-CM

## 2018-09-05 DIAGNOSIS — J310 Chronic rhinitis: Secondary | ICD-10-CM

## 2018-09-05 DIAGNOSIS — R945 Abnormal results of liver function studies: Secondary | ICD-10-CM

## 2018-09-05 DIAGNOSIS — G603 Idiopathic progressive neuropathy: Secondary | ICD-10-CM

## 2018-09-05 DIAGNOSIS — G629 Polyneuropathy, unspecified: Secondary | ICD-10-CM

## 2018-09-05 DIAGNOSIS — R748 Abnormal levels of other serum enzymes: Secondary | ICD-10-CM

## 2018-09-05 DIAGNOSIS — J309 Allergic rhinitis, unspecified: Secondary | ICD-10-CM

## 2018-09-05 DIAGNOSIS — R7402 Elevation of levels of lactic acid dehydrogenase (LDH): Secondary | ICD-10-CM

## 2018-09-05 DIAGNOSIS — R7401 Elevation of levels of liver transaminase levels: Secondary | ICD-10-CM

## 2018-09-05 NOTE — Addendum Note (Signed)
Addended by: Lisandro Meggett on: 09/05/2018 12:49 PM     Modules accepted: Orders

## 2018-09-05 NOTE — Addendum Note (Signed)
Addended by: Isabell Bonafede on: 09/05/2018 12:48 PM     Modules accepted: Orders

## 2018-09-05 NOTE — Addendum Note (Signed)
Addended by: Joesph July on: 09/05/2018 12:48 PM     Modules accepted: Orders

## 2018-09-05 NOTE — Addendum Note (Signed)
Addended by: Joesph July on: 09/05/2018 12:49 PM     Modules accepted: Orders

## 2018-09-05 NOTE — Addendum Note (Signed)
Addended by: Dirck Butch on: 09/05/2018 12:48 PM     Modules accepted: Orders

## 2018-09-05 NOTE — Addendum Note (Signed)
Addended by: Joesph July on: 09/05/2018 05:01 PM     Modules accepted: Orders

## 2018-09-05 NOTE — Addendum Note (Signed)
Addended by: Jamerson Vonbargen on: 09/05/2018 12:49 PM     Modules accepted: Orders

## 2018-09-05 NOTE — Addendum Note (Signed)
Addended by: Eldonna Neuenfeldt on: 09/05/2018 12:48 PM     Modules accepted: Orders

## 2018-09-05 NOTE — Addendum Note (Signed)
Addended by: Mivaan Corbitt on: 09/05/2018 12:49 PM     Modules accepted: Orders

## 2018-09-05 NOTE — Addendum Note (Signed)
Addended by: Jaymari Cromie on: 09/05/2018 12:48 PM     Modules accepted: Orders

## 2018-09-05 NOTE — Addendum Note (Signed)
Addended by: Joesph July on: 09/05/2018 12:51 PM     Modules accepted: Orders

## 2018-09-05 NOTE — Addendum Note (Signed)
Addended by: Raffael Bugarin on: 09/05/2018 12:48 PM     Modules accepted: Orders

## 2018-09-05 NOTE — Addendum Note (Signed)
Addended by: Oseas Detty on: 09/05/2018 12:49 PM     Modules accepted: Orders

## 2018-09-05 NOTE — Addendum Note (Signed)
Addended by: Neomia Herbel on: 09/05/2018 12:48 PM     Modules accepted: Orders

## 2018-09-05 NOTE — Addendum Note (Signed)
Addended by: Khaleb Broz on: 09/05/2018 12:49 PM     Modules accepted: Orders

## 2018-09-06 ENCOUNTER — Ambulatory Visit: Payer: Medicare Other

## 2018-09-06 DIAGNOSIS — R739 Hyperglycemia, unspecified: Secondary | ICD-10-CM

## 2018-09-06 DIAGNOSIS — R7402 Elevation of levels of lactic acid dehydrogenase (LDH): Secondary | ICD-10-CM

## 2018-09-06 DIAGNOSIS — R7401 Elevation of levels of liver transaminase levels: Secondary | ICD-10-CM

## 2018-09-06 DIAGNOSIS — G629 Polyneuropathy, unspecified: Secondary | ICD-10-CM

## 2018-09-06 DIAGNOSIS — J310 Chronic rhinitis: Secondary | ICD-10-CM

## 2018-09-06 DIAGNOSIS — J309 Allergic rhinitis, unspecified: Secondary | ICD-10-CM

## 2018-09-06 DIAGNOSIS — R945 Abnormal results of liver function studies: Secondary | ICD-10-CM

## 2018-09-06 DIAGNOSIS — R748 Abnormal levels of other serum enzymes: Secondary | ICD-10-CM

## 2018-09-06 DIAGNOSIS — G603 Idiopathic progressive neuropathy: Secondary | ICD-10-CM

## 2018-09-06 NOTE — Progress Notes (Signed)
Client here for lab work.    08:33 am - Labs drawn from [facility] without difficulty using a Vacutainer blood collection set. Attempts x 1. Dressing applied. Client tolerated well. Labs sent to [facility].     11 total tubes collected:    6 Tiger Top tubes spun & sent room temperature    2 Lavender tubes sent room temperature    1 Tiger Top tube spun & sent refrigerated    1 Lavender trasfered to Frozen Transfer tube and frozen    1 Lavender spun & plasma transferred to an amber transfer tub & sent refrigerated.

## 2018-09-07 ENCOUNTER — Encounter: Payer: Self-pay | Admitting: Internal Medicine

## 2018-09-07 ENCOUNTER — Encounter (INDEPENDENT_AMBULATORY_CARE_PROVIDER_SITE_OTHER): Payer: Self-pay | Admitting: Internal Medicine

## 2018-09-07 ENCOUNTER — Other Ambulatory Visit: Payer: Self-pay | Admitting: Internal Medicine

## 2018-09-07 DIAGNOSIS — I351 Nonrheumatic aortic (valve) insufficiency: Secondary | ICD-10-CM | POA: Insufficient documentation

## 2018-09-07 DIAGNOSIS — E039 Hypothyroidism, unspecified: Secondary | ICD-10-CM

## 2018-09-07 DIAGNOSIS — I77810 Thoracic aortic ectasia: Secondary | ICD-10-CM | POA: Insufficient documentation

## 2018-09-07 LAB — CBC AND DIFFERENTIAL
Baso(Absolute): 0.1 10*3/uL (ref 0.0–0.2)
Basos: 1 %
Eos: 3 %
Eosinophils Absolute: 0.2 10*3/uL (ref 0.0–0.4)
Hematocrit: 43.2 % (ref 37.5–51.0)
Hemoglobin: 14.4 g/dL (ref 13.0–17.7)
Lymphocytes Absolute: 3.1 10*3/uL (ref 0.7–3.1)
Lymphocytes: 37 %
MCH: 30.3 pg (ref 26.6–33.0)
MCHC: 33.3 g/dL (ref 31.5–35.7)
MCV: 91 fL (ref 79–97)
Monocytes Absolute: 0.1 10*3/uL (ref 0.1–0.9)
Monocytes: 9 %
Neutrophils Absolute: 4.2 10*3/uL (ref 1.4–7.0)
Neutrophils: 50 %
Platelets: 212 10*3/uL (ref 150–450)
RBC: 4.76 x10E6/uL (ref 4.14–5.80)
RDW: 12.7 % (ref 11.6–15.4)
WBC: 8.4 10*3/uL (ref 3.4–10.8)

## 2018-09-07 LAB — VITAMIN B12: Vitamin B-12: >2000 pg/mL — ABNORMAL HIGH (ref 232–1245)

## 2018-09-07 LAB — HEMOGLOBIN A1C: Hemoglobin A1C: 6.3 % — ABNORMAL HIGH (ref 4.8–5.6)

## 2018-09-07 LAB — SS-A , SS-B (SJOGREN'S)
Sjogren's Antibody (SS-A): <0.2 AI (ref 0.0–0.9)
Sjogren's Antibody (SS-B): <0.2 AI (ref 0.0–0.9)

## 2018-09-07 LAB — BILIRUBIN, TOTAL AND DIRECT
Bilirubin Direct: 0.16 mg/dL (ref 0.00–0.40)
Bilirubin Indirect: 0.54 mg/dL (ref 0.10–0.80)
Bilirubin, Total: 0.7 mg/dL (ref 0.0–1.2)

## 2018-09-07 LAB — HEPATITIS C ANTIBODY: HCV AB: <0.1 s/co ratio (ref 0.0–0.9)

## 2018-09-07 LAB — ANA SCREEN REFLEX: ANA Direct: NEGATIVE

## 2018-09-07 LAB — HEPATITIS B SURFACE ANTIGEN W/ REFLEX TO CONFIRMATION: Hepatitis B Surface Antigen: NEGATIVE

## 2018-09-07 LAB — ALT AND AST (SGOT,SGPT)
ALT: 56 IU/L — ABNORMAL HIGH (ref 0–44)
AST (SGOT): 38 IU/L (ref 0–40)

## 2018-09-07 LAB — CERULOPLASMIN: Ceruloplasmin: 17.1 mg/dL (ref 16.0–31.0)

## 2018-09-07 LAB — CELIAC DISEASE ANTIBODY PANEL
Deamidated Gliadin Abs, IgA: 4 units (ref 0–19)
Immunoglobulin A: 272 mg/dL (ref 61–437)
Transglutaminase IgA: <2 U/mL (ref 0–3)

## 2018-09-07 LAB — THYROID PANEL
Free Thyroxine Index: 2.1 (ref 1.2–4.9)
T3 Uptake: 29 % (ref 24–39)
T4, Total: 7.4 ug/dL (ref 4.5–12.0)

## 2018-09-07 LAB — ALKALINE PHOSPHATASE: Alkaline Phosphatase: 56 IU/L (ref 39–117)

## 2018-09-07 LAB — FERRITIN: Ferritin: 134 ng/mL (ref 30–400)

## 2018-09-07 LAB — SPECIMEN STATUS REPORT

## 2018-09-07 LAB — HEPATITIS B SURFACE ANTIBODY: HEPATITIS B SURFACE ANTIBODY: 303.2 m[IU]/mL (ref >9.9)

## 2018-09-08 ENCOUNTER — Other Ambulatory Visit: Payer: Self-pay | Admitting: Critical Care Medicine

## 2018-09-08 LAB — T4, FREE: T4, Free: 1.58 ng/dL (ref 0.82–1.77)

## 2018-09-08 LAB — TSH: TSH: 0.336 u[IU]/mL — ABNORMAL LOW (ref 0.450–4.500)

## 2018-09-09 LAB — ALLERGEN PROFILE WITH TOTAL IGE, RESPIRATORY-AREA 2
A. Alternata, IgE: <0.1 kU/L
ALLERGEN SHEEP SORREL IGE: <0.1 kU/L
ALLERGEN WHITE MULBERRY IGE: <0.1 kU/L
Allergen Maple Box Elder, IgE: <0.1 kU/L
Aspergillus Fumigatus, IgE: <0.1 kU/L
Bermuda Grass, IgE: <0.1 kU/L
Birch, IgE: 0.55 kU/L — AB
Cat Dander IgE: <0.1 kU/L
Cedar Mountain, IgE: <0.1 kU/L
Cladsporium Herbarum, IgE: <0.1 kU/L
Cockroach, German, IgE: <0.1 kU/L
Cottonwood, IgE: <0.1 kU/L
D Farinae Mite, IgE: 0.22 kU/L — AB
D Pteronyssinus, IgE: 0.25 kU/L — AB
Dog Hair, IgE: 0.27 kU/L — AB
Elm American, IgE: <0.1 kU/L
Immunoglobulin E, Total: 174 IU/mL (ref 6–495)
Johnson Grass, IgE: <0.1 kU/L
Mouse Urine, IgE: <0.1 kU/L
Oak White, IgE: 0.67 kU/L — AB
Pecan, Hickory Tree, IgE: <0.1 kU/L
Penicillium chrysogen, IgE: <0.1 kU/L
Pigweed, IgE: <0.1 kU/L
Ragweed, Short/Common, IgE: <0.1 kU/L
Timothy, IgE: <0.1 kU/L

## 2018-09-09 LAB — MITOCHONDRIAL AND ACTIN ANTIBODY PANEL
Mitochondrial Ab: <20 Units (ref 0.0–20.0)
Smooth Muscle AB: 6 Units (ref 0–19)

## 2018-09-10 ENCOUNTER — Other Ambulatory Visit: Payer: Self-pay | Admitting: Cardiovascular Disease

## 2018-09-10 DIAGNOSIS — I351 Nonrheumatic aortic (valve) insufficiency: Secondary | ICD-10-CM

## 2018-09-10 LAB — VITAMIN B1, WHOLE BLOOD: Whole Blood Vitamin B1: 123.2 nmol/L (ref 66.5–200.0)

## 2018-09-10 LAB — SPECIMEN STATUS REPORT

## 2018-09-11 ENCOUNTER — Other Ambulatory Visit: Payer: Self-pay | Admitting: Internal Medicine

## 2018-09-11 ENCOUNTER — Telehealth: Payer: Self-pay

## 2018-09-11 ENCOUNTER — Other Ambulatory Visit: Payer: Self-pay | Admitting: Gastroenterology

## 2018-09-11 ENCOUNTER — Encounter: Payer: Self-pay | Admitting: Internal Medicine

## 2018-09-11 DIAGNOSIS — I10 Essential (primary) hypertension: Secondary | ICD-10-CM

## 2018-09-11 LAB — IGG 1, 2, 3, AND 4
Immunoglobulin G1: 429 mg/dL (ref 248–810)
Immunoglobulin G2: 314 mg/dL (ref 130–555)
Immunoglobulin G3: 48 mg/dL (ref 15–102)
Immunoglobulin G4: 16 mg/dL (ref 2–96)

## 2018-09-11 MED ORDER — RAMIPRIL 10 MG PO CAPS
20.0000 mg | ORAL_CAPSULE | Freq: Every evening | ORAL | 1 refills | Status: DC
Start: 2018-09-11 — End: 2019-03-11

## 2018-09-11 NOTE — Telephone Encounter (Signed)
[facility] pharmacist called to clarify prescription for ramipril as patient had requested 4 caps/day instead of two. Informed pharmacist that per Dr. Dalbert Garnet note (and confirmation by RN Arline Asp) that original dose will stay as is: ramipril 10 mg take 2 capsules (20 mg total) nightly.  Do not increase dosage.

## 2018-09-11 NOTE — Telephone Encounter (Signed)
Sent recent lab results per request of [facility] - Dr. Jodie Echevaria. Sent via electronic ROI.

## 2018-09-12 LAB — IMMUNOFIXATION ELECTROPHORESIS
Albumin Electrophoresis: 4 g/dL (ref 2.9–4.4)
Albumin/Globulin Ratio: 1.7 (ref 0.7–1.7)
Alpha 1: 0.2 g/dL (ref 0.0–0.4)
Alpha 2: 0.6 g/dL (ref 0.4–1.0)
Beta: 1.1 g/dL (ref 0.7–1.3)
Gamma Globulin: 0.6 g/dL (ref 0.4–1.8)
Globulin, Total: 2.5 g/dL (ref 2.2–3.9)
IgM: 78 mg/dL (ref 20–172)
Protein, Total: 6.5 g/dL (ref 6.0–8.5)
Total IgG: 713 mg/dL (ref 700–1600)

## 2018-09-12 LAB — PARANEOPLASTIC PROFILE II (ANTI-HU AND ANTI-RI)
Anti-Hu Antibodies: <1:10 {titer}
Anti-Ri Antibodies: <1:10 {titer}

## 2018-09-13 LAB — VITAMIN B6: Vitamin B6: 83.1 ug/L — ABNORMAL HIGH (ref 5.3–46.7)

## 2018-09-17 ENCOUNTER — Telehealth: Payer: Self-pay

## 2018-09-17 NOTE — Telephone Encounter (Signed)
09/06/2018 Lab results complete.    Called. Spoke with client & informed him of above. Client requests that copy of labs be emailed to him for his personal records. In addition, client would like the results sent to Dr. Bosie Clos & Dr. Johnston Ebbs.    Lab results sent to Dr. Tamera Punt & Dr. Jon Billings per client's request. Lab results printed, scanned, and emailed to client's personal email address. Email address verified prior to information being sent.

## 2018-09-19 ENCOUNTER — Ambulatory Visit
Admission: RE | Admit: 2018-09-19 | Discharge: 2018-09-19 | Disposition: A | Discharge status: Home / Self Care | Payer: Medicare Other | Source: Ambulatory Visit | Attending: Cardiovascular Disease | Admitting: Cardiovascular Disease

## 2018-09-19 DIAGNOSIS — I712 Thoracic aortic aneurysm, without rupture: Secondary | ICD-10-CM

## 2018-09-19 DIAGNOSIS — I77810 Thoracic aortic ectasia: Secondary | ICD-10-CM | POA: Insufficient documentation

## 2018-09-19 DIAGNOSIS — I517 Cardiomegaly: Secondary | ICD-10-CM | POA: Insufficient documentation

## 2018-09-19 DIAGNOSIS — I7121 Aneurysm of the ascending aorta, without rupture: Secondary | ICD-10-CM

## 2018-09-19 DIAGNOSIS — I351 Nonrheumatic aortic (valve) insufficiency: Secondary | ICD-10-CM | POA: Insufficient documentation

## 2018-09-19 DIAGNOSIS — I358 Other nonrheumatic aortic valve disorders: Secondary | ICD-10-CM | POA: Insufficient documentation

## 2018-09-19 HISTORY — DX: Thoracic aortic aneurysm, without rupture: I71.2

## 2018-09-19 HISTORY — DX: Aneurysm of the ascending aorta, without rupture: I71.21

## 2018-09-20 ENCOUNTER — Encounter (INDEPENDENT_AMBULATORY_CARE_PROVIDER_SITE_OTHER): Payer: Self-pay | Admitting: Internal Medicine

## 2018-10-02 ENCOUNTER — Encounter: Payer: Self-pay | Admitting: Internal Medicine

## 2018-10-02 NOTE — Progress Notes (Signed)
Signed [facility] ICD 10 code scanned into chart and faxed to [facility].

## 2018-10-09 ENCOUNTER — Other Ambulatory Visit: Payer: Self-pay | Admitting: Internal Medicine

## 2018-10-28 ENCOUNTER — Encounter (INDEPENDENT_AMBULATORY_CARE_PROVIDER_SITE_OTHER): Payer: Self-pay

## 2018-10-29 ENCOUNTER — Telehealth: Payer: Self-pay | Admitting: Internal Medicine

## 2018-11-02 ENCOUNTER — Other Ambulatory Visit: Payer: Self-pay | Admitting: Internal Medicine

## 2018-11-03 ENCOUNTER — Other Ambulatory Visit: Payer: Self-pay | Admitting: Internal Medicine

## 2018-11-03 DIAGNOSIS — Z8709 Personal history of other diseases of the respiratory system: Secondary | ICD-10-CM

## 2018-11-10 ENCOUNTER — Other Ambulatory Visit: Payer: Self-pay | Admitting: Internal Medicine

## 2018-11-10 DIAGNOSIS — Z8709 Personal history of other diseases of the respiratory system: Secondary | ICD-10-CM

## 2018-11-13 ENCOUNTER — Other Ambulatory Visit: Payer: Self-pay | Admitting: Internal Medicine

## 2018-11-13 DIAGNOSIS — K117 Disturbances of salivary secretion: Secondary | ICD-10-CM

## 2018-11-15 ENCOUNTER — Other Ambulatory Visit: Payer: Self-pay | Admitting: Internal Medicine

## 2018-11-15 DIAGNOSIS — Z8709 Personal history of other diseases of the respiratory system: Secondary | ICD-10-CM

## 2018-11-15 MED ORDER — ALBUTEROL SULFATE HFA 108 (90 BASE) MCG/ACT IN AERS
INHALATION_SPRAY | RESPIRATORY_TRACT | 1 refills | Status: DC
Start: 2018-11-15 — End: 2020-04-22

## 2018-11-28 ENCOUNTER — Encounter (INDEPENDENT_AMBULATORY_CARE_PROVIDER_SITE_OTHER): Payer: Self-pay

## 2018-11-29 ENCOUNTER — Encounter (INDEPENDENT_AMBULATORY_CARE_PROVIDER_SITE_OTHER): Payer: Self-pay

## 2018-12-25 ENCOUNTER — Other Ambulatory Visit: Payer: Self-pay | Admitting: Internal Medicine

## 2018-12-25 DIAGNOSIS — I10 Essential (primary) hypertension: Secondary | ICD-10-CM

## 2019-01-14 ENCOUNTER — Other Ambulatory Visit: Payer: Self-pay | Admitting: Internal Medicine

## 2019-01-14 DIAGNOSIS — E039 Hypothyroidism, unspecified: Secondary | ICD-10-CM

## 2019-02-11 ENCOUNTER — Observation Stay
Admission: EM | Admit: 2019-02-11 | Discharge: 2019-02-12 | Disposition: A | Discharge status: Home / Self Care | Payer: Medicare Other | Attending: Internal Medicine | Admitting: Internal Medicine

## 2019-02-11 ENCOUNTER — Emergency Department: Payer: Medicare Other

## 2019-02-11 DIAGNOSIS — I10 Essential (primary) hypertension: Secondary | ICD-10-CM | POA: Insufficient documentation

## 2019-02-11 DIAGNOSIS — R140 Abdominal distension (gaseous): Secondary | ICD-10-CM | POA: Insufficient documentation

## 2019-02-11 DIAGNOSIS — E2740 Unspecified adrenocortical insufficiency: Secondary | ICD-10-CM | POA: Insufficient documentation

## 2019-02-11 DIAGNOSIS — J45909 Unspecified asthma, uncomplicated: Secondary | ICD-10-CM | POA: Insufficient documentation

## 2019-02-11 DIAGNOSIS — K581 Irritable bowel syndrome with constipation: Secondary | ICD-10-CM | POA: Diagnosis present

## 2019-02-11 DIAGNOSIS — R5382 Chronic fatigue, unspecified: Secondary | ICD-10-CM | POA: Insufficient documentation

## 2019-02-11 DIAGNOSIS — N281 Cyst of kidney, acquired: Secondary | ICD-10-CM | POA: Insufficient documentation

## 2019-02-11 DIAGNOSIS — K5900 Constipation, unspecified: Principal | ICD-10-CM | POA: Insufficient documentation

## 2019-02-11 DIAGNOSIS — E039 Hypothyroidism, unspecified: Secondary | ICD-10-CM | POA: Insufficient documentation

## 2019-02-11 DIAGNOSIS — N200 Calculus of kidney: Secondary | ICD-10-CM | POA: Insufficient documentation

## 2019-02-11 DIAGNOSIS — D72829 Elevated white blood cell count, unspecified: Secondary | ICD-10-CM | POA: Insufficient documentation

## 2019-02-11 DIAGNOSIS — R7989 Other specified abnormal findings of blood chemistry: Secondary | ICD-10-CM | POA: Insufficient documentation

## 2019-02-11 DIAGNOSIS — E785 Hyperlipidemia, unspecified: Secondary | ICD-10-CM | POA: Insufficient documentation

## 2019-02-11 DIAGNOSIS — K760 Fatty (change of) liver, not elsewhere classified: Secondary | ICD-10-CM | POA: Insufficient documentation

## 2019-02-11 LAB — CBC AND DIFFERENTIAL
Absolute NRBC: 0 10*3/uL (ref 0.00–0.00)
Basophils Absolute Automated: 0.06 10*3/uL (ref 0.00–0.08)
Basophils Automated: 0.3 %
Eosinophils Absolute Automated: 0.15 10*3/uL (ref 0.00–0.44)
Eosinophils Automated: 0.8 %
Hematocrit: 43.5 % (ref 37.6–49.6)
Hgb: 15 g/dL (ref 12.5–17.1)
Immature Granulocytes Absolute: 0.11 10*3/uL — ABNORMAL HIGH (ref 0.00–0.07)
Immature Granulocytes: 0.6 %
Lymphocytes Absolute Automated: 3.83 10*3/uL — ABNORMAL HIGH (ref 0.42–3.22)
Lymphocytes Automated: 19.4 %
MCH: 30.5 pg (ref 25.1–33.5)
MCHC: 34.5 g/dL (ref 31.5–35.8)
MCV: 88.6 fL (ref 78.0–96.0)
MPV: 10.4 fL (ref 8.9–12.5)
Monocytes Absolute Automated: 1.62 10*3/uL — ABNORMAL HIGH (ref 0.21–0.85)
Monocytes: 8.2 %
Neutrophils Absolute: 13.94 10*3/uL — ABNORMAL HIGH (ref 1.10–6.33)
Neutrophils: 70.7 %
Nucleated RBC: 0 /100 WBC (ref 0.0–0.0)
Platelets: 270 10*3/uL (ref 142–346)
RBC: 4.91 10*6/uL (ref 4.20–5.90)
RDW: 12 % (ref 11–15)
WBC: 19.71 10*3/uL — ABNORMAL HIGH (ref 3.10–9.50)

## 2019-02-11 LAB — LACTIC ACID, PLASMA
Lactic Acid: 3 mmol/L — ABNORMAL HIGH (ref 0.2–2.0)
Lactic Acid: 2.1 mmol/L — ABNORMAL HIGH (ref 0.2–2.0)
Lactic Acid: 1.8 mmol/L (ref 0.2–2.0)

## 2019-02-11 LAB — COMPREHENSIVE METABOLIC PANEL
ALT: 50 U/L (ref 0–55)
AST (SGOT): 36 U/L — ABNORMAL HIGH (ref 5–34)
Albumin/Globulin Ratio: 1.9 (ref 0.9–2.2)
Albumin: 4.5 g/dL (ref 3.5–5.0)
Alkaline Phosphatase: 69 U/L (ref 38–106)
Anion Gap: 12 (ref 5.0–15.0)
BUN: 22 mg/dL (ref 9–28)
Bilirubin, Total: 1.2 mg/dL (ref 0.2–1.2)
CO2: 22 mEq/L (ref 22–29)
Calcium: 9.4 mg/dL (ref 8.5–10.5)
Chloride: 104 mEq/L (ref 100–111)
Creatinine: 1.1 mg/dL (ref 0.7–1.3)
Globulin: 2.4 g/dL (ref 2.0–3.6)
Glucose: 131 mg/dL — ABNORMAL HIGH (ref 70–100)
Potassium: 4.1 mEq/L (ref 3.5–5.1)
Protein, Total: 6.9 g/dL (ref 6.0–8.3)
Sodium: 138 mEq/L (ref 136–145)

## 2019-02-11 LAB — LIPASE: Lipase: 11 U/L (ref 8–78)

## 2019-02-11 LAB — GFR: EGFR: >60

## 2019-02-11 LAB — TSH: TSH: 0.47 u[IU]/mL (ref 0.35–4.94)

## 2019-02-11 LAB — CEA: CEA: 2.5 ng/mL (ref 0.0–5.0)

## 2019-02-11 MED ORDER — ONDANSETRON HCL 4 MG/2ML IJ SOLN
4.00 mg | Freq: Once | INTRAMUSCULAR | Status: AC
Start: 2019-02-11 — End: 2019-02-11
  Administered 2019-02-11: 4 mg via INTRAVENOUS
  Filled 2019-02-11: qty 2

## 2019-02-11 MED ORDER — SIMVASTATIN 10 MG PO TABS
20.0000 mg | ORAL_TABLET | Freq: Every evening | ORAL | Status: DC
Start: 2019-02-11 — End: 2019-02-12
  Administered 2019-02-11: 20 mg via ORAL
  Filled 2019-02-11: qty 2

## 2019-02-11 MED ORDER — IBUPROFEN 600 MG PO TABS
600.00 mg | ORAL_TABLET | Freq: Once | ORAL | Status: AC
Start: 2019-02-11 — End: 2019-02-11
  Administered 2019-02-11: 600 mg via ORAL
  Filled 2019-02-11: qty 1

## 2019-02-11 MED ORDER — MORPHINE SULFATE 10 MG/ML IJ/IV SOLN (WRAP)
8.0000 mg | Freq: Once | Status: AC
Start: 2019-02-11 — End: 2019-02-11
  Administered 2019-02-11: 8 mg via INTRAVENOUS
  Filled 2019-02-11: qty 1

## 2019-02-11 MED ORDER — AMLODIPINE BESYLATE 5 MG PO TABS
5.0000 mg | ORAL_TABLET | Freq: Two times a day (BID) | ORAL | Status: DC
Start: 2019-02-11 — End: 2019-02-12
  Administered 2019-02-11 – 2019-02-12 (×2): 5 mg via ORAL
  Filled 2019-02-11 (×2): qty 1

## 2019-02-11 MED ORDER — ENOXAPARIN SODIUM 40 MG/0.4ML SC SOLN
40.00 mg | Freq: Every day | SUBCUTANEOUS | Status: DC
Start: 2019-02-12 — End: 2019-02-12
  Administered 2019-02-12: 40 mg via SUBCUTANEOUS
  Filled 2019-02-11: qty 0.4

## 2019-02-11 MED ORDER — NALOXONE HCL 0.4 MG/ML IJ SOLN (WRAP)
0.20 mg | INTRAMUSCULAR | Status: DC | PRN
Start: 2019-02-11 — End: 2019-02-12

## 2019-02-11 MED ORDER — ACETAMINOPHEN 650 MG RE SUPP
650.00 mg | RECTAL | Status: DC | PRN
Start: 2019-02-11 — End: 2019-02-12

## 2019-02-11 MED ORDER — METRONIDAZOLE IN NACL 500 MG/100 ML IV SOLN
500.00 mg | Freq: Once | INTRAVENOUS | Status: DC
Start: 2019-02-11 — End: 2019-02-11

## 2019-02-11 MED ORDER — SODIUM CHLORIDE 0.9 % IV SOLN
INTRAVENOUS | Status: DC
Start: 2019-02-11 — End: 2019-02-12

## 2019-02-11 MED ORDER — METRONIDAZOLE IN NACL 500 MG/100 ML IV SOLN
500.00 mg | Freq: Three times a day (TID) | INTRAVENOUS | Status: DC
Start: 2019-02-11 — End: 2019-02-12
  Administered 2019-02-11 – 2019-02-12 (×3): 500 mg via INTRAVENOUS
  Filled 2019-02-11 (×3): qty 100

## 2019-02-11 MED ORDER — LEVOTHYROXINE SODIUM 75 MCG PO TABS
150.0000 ug | ORAL_TABLET | Freq: Every day | ORAL | Status: DC
Start: 2019-02-12 — End: 2019-02-12
  Filled 2019-02-11: qty 2

## 2019-02-11 MED ORDER — PANTOPRAZOLE SODIUM 40 MG IV SOLR
40.00 mg | Freq: Every day | INTRAVENOUS | Status: DC
Start: 2019-02-11 — End: 2019-02-12
  Administered 2019-02-11 – 2019-02-12 (×2): 40 mg via INTRAVENOUS
  Filled 2019-02-11 (×2): qty 40

## 2019-02-11 MED ORDER — ONDANSETRON 4 MG PO TBDP
4.00 mg | ORAL_TABLET | Freq: Four times a day (QID) | ORAL | Status: DC | PRN
Start: 2019-02-11 — End: 2019-02-12

## 2019-02-11 MED ORDER — FINASTERIDE 5 MG PO TABS
5.0000 mg | ORAL_TABLET | Freq: Every day | ORAL | Status: DC
Start: 2019-02-12 — End: 2019-02-12
  Administered 2019-02-12: 5 mg via ORAL
  Filled 2019-02-11: qty 1

## 2019-02-11 MED ORDER — LACTATED RINGERS IV BOLUS
1000.00 mL | Freq: Once | INTRAVENOUS | Status: AC
Start: 2019-02-11 — End: 2019-02-11
  Administered 2019-02-11: 1000 mL via INTRAVENOUS

## 2019-02-11 MED ORDER — IOHEXOL 350 MG/ML IV SOLN
100.00 mL | Freq: Once | INTRAVENOUS | Status: AC | PRN
Start: 2019-02-11 — End: 2019-02-11
  Administered 2019-02-11: 100 mL via INTRAVENOUS

## 2019-02-11 MED ORDER — ONDANSETRON HCL 4 MG/2ML IJ SOLN
4.00 mg | Freq: Four times a day (QID) | INTRAMUSCULAR | Status: DC | PRN
Start: 2019-02-11 — End: 2019-02-12

## 2019-02-11 MED ORDER — ALBUTEROL-IPRATROPIUM 2.5-0.5 (3) MG/3ML IN SOLN
3.00 mL | Freq: Four times a day (QID) | RESPIRATORY_TRACT | Status: DC | PRN
Start: 2019-02-11 — End: 2019-02-12

## 2019-02-11 MED ORDER — LEVOFLOXACIN IN D5W 500 MG/100ML IV SOLN
500.00 mg | INTRAVENOUS | Status: DC
Start: 2019-02-11 — End: 2019-02-12
  Administered 2019-02-11 – 2019-02-12 (×2): 500 mg via INTRAVENOUS
  Filled 2019-02-11 (×2): qty 100

## 2019-02-11 MED ORDER — HYDROCORTISONE 20 MG PO TABS
70.0000 mg | ORAL_TABLET | Freq: Every day | ORAL | Status: DC
Start: 2019-02-12 — End: 2019-02-11

## 2019-02-11 MED ORDER — HYDROCORTISONE SOD SUC (PF) 100 MG IJ SOLR (WRAP)
50.00 mg | Freq: Once | INTRAMUSCULAR | Status: AC
Start: 2019-02-11 — End: 2019-02-11
  Administered 2019-02-11: 50 mg via INTRAVENOUS
  Filled 2019-02-11: qty 2

## 2019-02-11 MED ORDER — HYDROCORTISONE 5 MG PO TABS
15.0000 mg | ORAL_TABLET | Freq: Every day | ORAL | Status: DC
Start: 2019-02-12 — End: 2019-02-12
  Administered 2019-02-12: 15 mg via ORAL
  Filled 2019-02-11: qty 3

## 2019-02-11 MED ORDER — RAMIPRIL 5 MG PO CAPS
20.00 mg | ORAL_CAPSULE | Freq: Every evening | ORAL | Status: DC
Start: 2019-02-12 — End: 2019-02-12

## 2019-02-11 MED ORDER — FLUTICASONE FUROATE-VILANTEROL 100-25 MCG/INH IN AEPB
1.00 | INHALATION_SPRAY | Freq: Every morning | RESPIRATORY_TRACT | Status: DC
Start: 2019-02-12 — End: 2019-02-12
  Filled 2019-02-11: qty 14

## 2019-02-11 MED ORDER — MONTELUKAST SODIUM 10 MG PO TABS
10.0000 mg | ORAL_TABLET | Freq: Every evening | ORAL | Status: DC
Start: 2019-02-11 — End: 2019-02-12
  Administered 2019-02-11: 10 mg via ORAL
  Filled 2019-02-11: qty 1

## 2019-02-11 MED ORDER — ACETAMINOPHEN 325 MG PO TABS
650.0000 mg | ORAL_TABLET | ORAL | Status: DC | PRN
Start: 2019-02-11 — End: 2019-02-12
  Filled 2019-02-11: qty 2

## 2019-02-11 MED ORDER — IOHEXOL 12 MG/ML PO SOLN
1000.00 mL | Freq: Once | ORAL | Status: AC
Start: 2019-02-11 — End: 2019-02-11
  Administered 2019-02-11: 1000 mL via ORAL

## 2019-02-11 MED ORDER — BUPRENORPHINE HCL 450 MCG BU FILM
450.00 ug | ORAL_FILM | Freq: Two times a day (BID) | BUCCAL | Status: DC
Start: 2019-02-12 — End: 2019-02-12
  Administered 2019-02-12: 450 ug via BUCCAL

## 2019-02-11 MED ORDER — CARVEDILOL 6.25 MG PO TABS
6.2500 mg | ORAL_TABLET | Freq: Two times a day (BID) | ORAL | Status: DC
Start: 2019-02-11 — End: 2019-02-12
  Administered 2019-02-11 – 2019-02-12 (×2): 6.25 mg via ORAL
  Filled 2019-02-11 (×2): qty 1

## 2019-02-11 MED ORDER — LEVOFLOXACIN IN D5W 500 MG/100ML IV SOLN
500.00 mg | Freq: Once | INTRAVENOUS | Status: DC
Start: 2019-02-11 — End: 2019-02-11

## 2019-02-11 NOTE — H&P (Signed)
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 Abdominal Pain   . Fever      History Gathered From: Self, EMS/ED notes., and EMR.    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  I expect an inpatient to remain in the hospital for less than 2 midnights.  I started evaluating the patient at 1100 am on 22 June , 2020.    Abdominal pain likely secondary to constipation  Leukocytosis possibly due to chronic steroids versus infection  Elevated lactic acid that is trending down, rule out ischemia    -Pain has improved after having multiple bowel movements  -Ultrasound of abdomen to rule out mesenteric stenosis  -Empiric antibiotics with Levaquin and Flagyl.  Follow blood cultures  -Clear liquid diet and will advance as tolerated  -PPI  -GI following    History of hypertension: Resume amlodipine and Coreg, restart ACE inhibitor tomorrow  History of hypothyroidism: Add on TSH, continue with levothyroxine  History of chronic lower back pain: On Belvuca BID, has been taking ibuprofen 3-4x/week   History of adrenal insufficiency on chronic hydrocortisone: BP stable, continue with Cortef  History of asthma: Continue with Singulair, substitute Breo for Symbicort and DuoNebs as needed  History of BPH: Continue with finasteride  History of chronic fatigue syndrome and fibromyalgia:  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  CT of the abdomen and pelvis in the ER showed large stool content with mild adjacent perirectal stranding.  He received enemas in the ER is and now having multiple bowel movements.  My evaluation he states his pain is significantly better after having bowel movements.  Denies any nausea and wondering whether his diet can be advanced from clear liquids.    ED: Afebrile, normotensive. Labs showing WBC of 19.71, elevated lactic acid level (3.0), relatively normal LFT's and normal lipase. CT of a/p with IV and PO contrast shows large stool content in the colon with moderate stool distention of the rectum, mild adjacent perirectal stranding, non-obstructive right renal calculi and fatty infiltration of the liver. He received 2 L NS bolus, IV morphine 8 mg x 2, IV zofran x 2 and Solu-Cortif. Blood cultures were drawn.   PRIMARY CARE MD: Modesto Charon, MD    Past Medical History:   Diagnosis Date   . Avascular necrosis 2008    R hip - did not require surgery   . Brain tumor 2003  Taken out in 2003, has returned    . Cold intolerance    . Color blindness    . DJD (degenerative joint disease)     Lumbar / Sacral   . Dry eyes     uses restasis eyedrops which helps (RF, CCP, CRP and ESR have been wnl in past)   . Eczema 03/29/2016    Right thumb    . Encounter for hepatitis C screening test for low risk patient 08/2016    negative   . Encounter for pharmacogenetic testing 10/2015    MEDIMAP   . Hemorrhoids    . Lateral epicondylitis     Left   . Osteoporosis    . Screening PSA (prostate specific antigen) 02/14/2018    0.4   . Tinnitus     high pitched buzzing comes and goes - started after craniotomy for meningioma       Past Surgical History:   Procedure  Laterality Date   . COLONOSCOPY  2008   . EXCISION, LESION  12/03/2008    benign lesion   . ORCHIOPEXY Left 1980    for intermittent torsion   . Subtotal resection of posterior sagittal meningioma  12/05/2002    Partial brain resection secondary to meningioma at [facility]   . TONSILLECTOMY AND ADENOIDECTOMY  as a child   . TOOTH EXTRACTION Right 06/2018       Allergies   Allergen Reactions   . Bextra [Valdecoxib] Rash       Prior to Admission medications    Medication Sig Start Date End Date Taking? Authorizing Provider   prochlorperazine (COMPAZINE) 10 MG tablet Take 10 mg by mouth every 6 (six) hours as needed   Yes [provider]   albuterol (PROAIR HFA) 108 (90 Base) MCG/ACT inhaler INHALE ONE PUFF BY MOUTH EVERY FOUR TO SIX HOURS AS NEEDED FOR WHEEZING 11/15/18   Broderick, Pearletha Furl, MD   amLODIPine (NORVASC) 5 MG tablet TAKE ONE TABLET BY MOUTH TWICE DAILY  10/10/18   Modesto Charon, MD   BELBUCA 450 MCG Film  07/11/18   [provider]   Buprenorphine Lavera Guise) 15 MCG/HR Patch Weekly  06/28/18   [provider]   Calcium Carbonate-Vitamin D (CALCIUM + D PO) Take by mouth. 500/400mg   daily     [provider]   carvedilol (COREG) 6.25 MG tablet TAKE TWO TABLETS BY MOUTH TWICE DAILY WITH FOOD  12/26/18   Modesto Charon, MD   chlorhexidine (PERIDEX) 0.12 % solution  07/03/18   [provider]   clonazePAM (KLONOPIN) 0.5 MG tablet Take 0.25 mg by mouth daily as needed.     11/25/16   [provider]   Coenzyme Q10 100 MG capsule Take 100 mg by mouth daily.    [provider]   ezetimibe (ZETIA) 10 MG tablet TAKE ONE TABLET BY MOUTH ONE TIME DAILY  06/07/18   Modesto Charon, MD   finasteride (PROSCAR) 5 MG tablet Take 1 tablet (5 mg total) by mouth daily 04/22/18   Broderick, Pearletha Furl, MD   gabapentin (NEURONTIN) 300 MG capsule Take 900 mg by mouth 3 (three) times daily       [provider]   hydrocortisone (CORTEF) 20 MG tablet Take  70 mg by mouth daily    [provider]   levothyroxine (SYNTHROID) 150 MCG tablet Take 1 tablet daily at 6pm. 01/15/19   Broderick, Pearletha Furl, MD   metoclopramide (REGLAN) 5 MG  tablet Take 1 tablet (5 mg total) by mouth 4 (four) times daily as needed (nausea) 02/13/18   Broderick, Pearletha Furl, MD   montelukast (SINGULAIR) 10 MG tablet Take one tablet by mouth at bedtime as directed 07/31/18   Modesto Charon, MD   Multiple Vitamins-Minerals (CENTRUM SILVER PO) Take 1 tablet by mouth daily.      [provider]   nystatin (MYCOSTATIN) 100000 UNIT/ML suspension SWISH IN MOUTH AND RETAIN FOR AS LONG AS POSSIBLE (SEVERAL MINUTES) BEFORE SWALLOWING 4 TIMES DAILY 05/08/18   Broderick, Pearletha Furl, MD   pilocarpine (SALAGEN) 5 MG tablet TAKE ONE TABLET BY MOUTH THREE TIMES DAILY  11/13/18   Modesto Charon, MD   pyridoxine (B-6) 100 MG tablet Take 100 mg by mouth daily.    [provider]   ramipril (ALTACE) 10 MG capsule Take 2 capsules (20 mg total) by mouth nightly 09/11/18   Broderick, Pearletha Furl, MD   simvastatin (ZOCOR) 20 MG tablet TAKE ONE TABLET BY MOUTH AT BEDTIME  11/02/18   Broderick, Pearletha Furl, MD   Somatropin (NUTROPIN AQ NUSPIN 10) 10 MG/2ML Solution Inject 0.3 mg into the skin daily    [provider]   SYMBICORT 80-4.5 MCG/ACT inhaler Inhale 2 puffs into the lungs two times daily 11/10/18   Modesto Charon, MD   testosterone (ANDROGEL) 25 MG/2.5GM (1%) Gel 2.5 mg daily.     10/04/16   [provider]   traMODol (ULTRAM-ER) 300 MG 24 hr tablet Take 100 mg by mouth daily    01/22/18   [provider]   VITAMIN D, CHOLECALCIFEROL, PO Take 5,000 Units by mouth daily.       [provider]       Social History     Socioeconomic History   . Marital status: Married     Spouse name: None   . Number of children: None   . Years of education: None   . Highest education level: None   Occupational History   . None   Social Needs   . Financial resource  strain: None   . Food insecurity     Worry: None     Inability: None   . Transportation needs     Medical: None     Non-medical: None   Tobacco Use   . Smoking status: Never Smoker   . Smokeless tobacco: Never Used   Substance and Sexual Activity   . Alcohol use: No     Comment: rare   . Drug use: Yes     Comment: uses MJ for pain daily   . Sexual activity: None   Lifestyle   . Physical activity     Days per week: None     Minutes per session: None   . Stress: None   Relationships   . Social Wellsite geologist on phone: None     Gets together: None     Attends religious service: None     Active member of club or organization: None     Attends meetings of clubs or organizations: None     Relationship status: None   . Intimate partner violence     Fear of current or ex partner: None     Emotionally abused: None     Physically abused: None     Forced sexual activity: None   Other Topics Concern   . None   Social History Narrative  He lives in [place] and is from [employer]. Married - wife Angelique Blonder. Two daughters (one lives in [place] and one in [place]). Retired Web designer - retired in 2016.    No additional social history.    Family History   Problem Relation Age of Onset   . Asthma Daughter    . Tuberculosis Mother    . Dementia Mother    . Migraines Sister     No additional family history.     REVIEW OF SYSTEMS     Ten point review of systems negative or as per HPI and below endorsements.    PHYSICAL EXAM     Vital Signs (most recent): BP 168/79   Pulse 83   Temp 98.4 F (36.9 C) (Oral)   Ht 1.778 m (5\' 10" )   Wt 80.5 kg (177 lb 7.5 oz)   SpO2 97%   BMI 25.46 kg/m   Constitutional: No apparent distress.  Laying in bed in no acute distress on room air.  Patient speaks freely in full sentences.   HEENT: NC/AT, PERRL, right eyelid stye (improving with warm compresses per patient) no scleral icterus or conjunctival pallor, no nasal discharge, MMM, oropharynx without erythema or exudate  Neck: trachea midline,  supple, no cervical or supraclavicular lymphadenopathy or masses  Cardiovascular: RRR, normal S1 S2, no murmurs, gallops, palpable thrills, no JVD, Non-displaced PMI.  Respiratory: Normal rate. No retractions or increased work of breathing. Clear to auscultation and percussion bilaterally.  Gastrointestinal: +BS, non-distended, soft, minimal tenderness on palpation of left lower quadrant, no rebound or guarding, no hepatosplenomegaly  Musculoskeletal: ROM and motor strength grossly normal. No clubbing, edema, or cyanosis. DP and radial pulses 2+ and symmetric.  Capillary refill normal  Skin: no rashes, jaundice or other lesions  Neurologic: EOMI, CN 2-12 grossly intact. no gross motor or sensory deficits  Psychiatric: AAOx3, affect and mood appropriate. The patient is alert, interactive, appropriate.    LABS & IMAGING     Recent Results (from the past 24 hour(s))   CBC and differential    Collection Time: 02/11/19  7:34 AM   Result Value Ref Range    WBC 19.71 (H) 3.10 - 9.50 x10 3/uL    Hgb 15.0 12.5 - 17.1 g/dL    Hematocrit 16.1 09.6 - 49.6 %    Platelets 270 142 - 346 x10 3/uL    RBC 4.91 4.20 - 5.90 x10 6/uL    MCV 88.6 78.0 - 96.0 fL    MCH 30.5 25.1 - 33.5 pg    MCHC 34.5 31.5 - 35.8 g/dL    RDW 12 11 - 15 %    MPV 10.4 8.9 - 12.5 fL    Neutrophils 70.7 None %    Lymphocytes Automated 19.4 None %    Monocytes 8.2 None %    Eosinophils Automated 0.8 None %    Basophils Automated 0.3 None %    Immature Granulocyte 0.6 None %    Nucleated RBC 0.0 0.0 - 0.0 /100 WBC    Neutrophils Absolute 13.94 (H) 1.10 - 6.33 x10 3/uL    Abs Lymph Automated 3.83 (H) 0.42 - 3.22 x10 3/uL    Abs Mono Automated 1.62 (H) 0.21 - 0.85 x10 3/uL    Abs Eos Automated 0.15 0.00 - 0.44 x10 3/uL    Absolute Baso Automated 0.06 0.00 - 0.08 x10 3/uL    Absolute Immature Granulocyte 0.11 (H) 0.00 - 0.07 x10 3/uL    Absolute NRBC 0.00 0.00 -  0.00 x10 3/uL   Comprehensive metabolic panel    Collection Time: 02/11/19  7:34 AM   Result Value Ref  Range    Glucose 131 (H) 70 - 100 mg/dL    BUN 22 9 - 28 mg/dL    Creatinine 1.1 0.7 - 1.3 mg/dL    Sodium 161 096 - 045 mEq/L    Potassium 4.1 3.5 - 5.1 mEq/L    Chloride 104 100 - 111 mEq/L    CO2 22 22 - 29 mEq/L    Calcium 9.4 8.5 - 10.5 mg/dL    Protein, Total 6.9 6.0 - 8.3 g/dL    Albumin 4.5 3.5 - 5.0 g/dL    AST (SGOT) 36 (H) 5 - 34 U/L    ALT 50 0 - 55 U/L    Alkaline Phosphatase 69 38 - 106 U/L    Bilirubin, Total 1.2 0.2 - 1.2 mg/dL    Globulin 2.4 2.0 - 3.6 g/dL    Albumin/Globulin Ratio 1.9 0.9 - 2.2    Anion Gap 12.0 5.0 - 15.0   Lipase    Collection Time: 02/11/19  7:34 AM   Result Value Ref Range    Lipase 11 8 - 78 U/L   GFR    Collection Time: 02/11/19  7:34 AM   Result Value Ref Range    EGFR >60.0    Lactic acid, plasma    Collection Time: 02/11/19  7:40 AM   Result Value Ref Range    Lactic acid 3.0 (H) 0.2 - 2.0 mmol/L       IMAGING (images personally reviewed and concur with radiologist unless otherwise stated below):  CT Abdomen Pelvis W IV And PO Cont   Final Result       1. Large stool content in the colon with moderate stool distention of   the rectum. Mild adjacent perirectal stranding. Findings may be   secondary to constipation.   2. Nonobstructive right renal calculi. Left renal cyst.   3. Fatty infiltration of the liver.      Kennyth Lose, MD    02/11/2019 9:45 AM          Markers:        EMERGENCY DEPARTMENT COURSE:  Orders Placed This Encounter   Procedures   . Blood Culture Aerobic/Anaerobic #1   . Blood Culture Aerobic/Anaerobic #2   . CT Abdomen Pelvis W IV And PO Cont   . CBC and differential   . Comprehensive metabolic panel   . Lipase   . UA with reflex to micro (pts  3 + yrs)   . Lactic acid, plasma   . Lactic acid, plasma   . GFR   . Bladder scan   . Soap suds enema       Patient Lines/Drains/Airways Status    Active PICC Line / CVC Line / PIV Line / Drain / Airway / Intraosseous Line / Epidural Line / ART Line / Line / Wound / Pressure Ulcer / NG/OG Tube     Name:    Placement date:   Placement time:   Site:   Days:    Peripheral IV 02/11/19 Left Antecubital   02/11/19    0730    Antecubital   less than 1                  Signed,  Renea Schoonmaker, MD  02/11/2019 11:01 AM

## 2019-02-11 NOTE — ED Notes (Signed)
GI NP at bedside. 

## 2019-02-11 NOTE — Consults (Signed)
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 Mild adjacent perirectal stranding  Takes ibuprofen ~ 3-4x/week for lower back pain    -Leukocytosis, on chronic steroids, monitor for infection  -Elevated lactic acid      Plan:   1. Hold NSAIDs, Protonix daily  2. CEA, Blood cultures, empiric Levaquin/Flagyl, US doppler to r/o mesenteric stenosis  3. Clears, Miralax daily post US dopplers  4. Will consider colonoscopy +/- EGD later this week depending on clinical progress. Patient said he is not interested. However, patient's wife said she would like to consider it.   5. D/w Dr. 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 Associated with nausea and dry heaves. No diarrhea. No fever. No recent traveling or ill contact. Has mild rectal bleeding in the ED after rectal exam. Went to the bathroom several times in the ED post enema(s). BM is 2x/day at baseline. Denies diarrhea.     Endoscopic History   EGD: 15 yrs ago    Colonoscopy: 10 yrs ago  Cologuard 5 yrs ago    ERCP:    Past Medical History:     Past Medical History:   Diagnosis Date   . Avascular necrosis 2008    R hip - did not require surgery   . Brain tumor 2003    Taken out in 2003, has returned    . Cold intolerance    . Color blindness    . DJD  (degenerative joint disease)     Lumbar / Sacral   . Dry eyes     uses restasis eyedrops which helps (RF, CCP, CRP and ESR have been wnl in past)   . Eczema 03/29/2016    Right thumb    . Encounter for hepatitis C screening test for low risk patient 08/2016    negative   . Encounter for pharmacogenetic testing 10/2015    MEDIMAP   . Hemorrhoids    . Lateral epicondylitis     Left   . Osteoporosis    . Screening PSA (prostate specific antigen) 02/14/2018    0.4   . Tinnitus     high pitched buzzing comes and goes - started after craniotomy for meningioma            Past Surgical History:     Past Surgical History:   Procedure Laterality Date   . COLONOSCOPY  2008   . EXCISION, LESION  12/03/2008    benign lesion   . ORCHIOPEXY Left 1980    for intermittent torsion   . Subtotal resection of posterior sagittal meningioma  12/05/2002    Partial brain resection secondary to [facility]   .  TONSILLECTOMY AND ADENOIDECTOMY  as a child   . TOOTH EXTRACTION Right 06/2018       Family History:     Family History   Problem Relation Age of Onset   . Asthma Daughter    . Tuberculosis Mother    . Dementia Mother    . Migraines Sister        Social History:     Social History     Socioeconomic History   . Marital status: Married     Spouse name: Not on file   . Number of children: Not on file   . Years of education: Not on file   . Highest education level: Not on file   Occupational History   . Not on file   Social Needs   . Financial resource strain: Not on file   . Food insecurity     Worry: Not on file     Inability: Not on file   . Transportation needs     Medical: Not on file     Non-medical: Not on file   Tobacco Use   . Smoking status: Never Smoker   . Smokeless tobacco: Never Used   Substance and Sexual Activity   . Alcohol use: No     Comment: rare   . Drug use: Yes     Comment: uses MJ for pain daily   . Sexual activity: Not on file   Lifestyle   . Physical activity     Days per week: Not on file     Minutes per  session: Not on file   . Stress: Not on file   Relationships   . Social Wellsite geologist on phone: Not on file     Gets together: Not on file     Attends religious service: Not on file     Active member of club or organization: Not on file     Attends meetings of clubs or organizations: Not on file     Relationship status: Not on file   . Intimate partner violence     Fear of current or ex partner: Not on file     Emotionally abused: Not on file     Physically abused: Not on file     Forced sexual activity: Not on file   Other Topics Concern   . Not on file   Social History Narrative    He lives in [place] and is from [place]. Married - wife Angelique Blonder. Two daughters (one lives in [place] and one in [place]). Retired Web designer - retired in 2016.       Allergies:     Allergies   Allergen Reactions   . Bextra [Valdecoxib] Rash       Medications:       Review of Systems:   A comprehensive review of systems was:   History obtained from the patient  Gastrointestinal ROS:  positive for - abdominal pain  negative for - stool incontinence or swallowing difficulty/pain    A comprehensive review of systems was: History obtained from the patient  The patient denies any additional changes to their otic, opthalmologic, dermatologic, pulmonary, cardiac, genitourinary, musculoskeletal, hematologic, constitutional, or psychiatric systems.      Physical Exam:     Vitals:    02/11/19 1138   BP:    Pulse:    Temp: 98.6 F (37 C)   SpO2:        Intake and Output  Summary (Last 24 hours) at Date Time  No intake or output data in the 24 hours ending 02/11/19 1151    General appearance - alert, well appearing, and in no distress  Mental status - alert, oriented to person, place, and time  Eyes - pupils equal and reactive, extraocular eye movements intact   Abdomen - soft, mildly distended, + pain in the LLQ, + small umbilical hernia  Extremities - peripheral pulses normal, no pedal edema, no clubbing or cyanosis      Lab:     Results      Procedure Component Value Units Date/Time    Lactic acid, plasma [[id number]]  (Abnormal) Collected:  02/11/19 1100    Specimen:  Blood Updated:  02/11/19 1120     Lactic acid 2.1 mmol/L     Blood Culture Aerobic/Anaerobic #2 [[id number]] Collected:  02/11/19 1100    Specimen:  Arm from Blood, Venipuncture Updated:  02/11/19 1100    Narrative:       1 BLUE+1 PURPLE    Blood Culture Aerobic/Anaerobic #1 [[id number]] Collected:  02/11/19 0852    Specimen:  Arm from Blood, Venipuncture Updated:  02/11/19 0852    Narrative:       1 BLUE+1 PURPLE    Comprehensive metabolic panel [[id number]]  (Abnormal) Collected:  02/11/19 0734    Specimen:  Blood Updated:  02/11/19 0801     Glucose 131 mg/dL      BUN 22 mg/dL      Creatinine 1.1 mg/dL      Sodium 244 mEq/L      Potassium 4.1 mEq/L      Chloride 104 mEq/L      CO2 22 mEq/L      Calcium 9.4 mg/dL      Protein, Total 6.9 g/dL      Albumin 4.5 g/dL      AST (SGOT) 36 U/L      ALT 50 U/L      Alkaline Phosphatase 69 U/L      Bilirubin, Total 1.2 mg/dL      Globulin 2.4 g/dL      Albumin/Globulin Ratio 1.9     Anion Gap 12.0    Lipase [[id number]] Collected:  02/11/19 0734    Specimen:  Blood Updated:  02/11/19 0801     Lipase 11 U/L     GFR [[id number]] Collected:  02/11/19 0734     Updated:  02/11/19 0801     EGFR >60.0    Lactic acid, plasma [[id number]]  (Abnormal) Collected:  02/11/19 0740    Specimen:  Blood Updated:  02/11/19 0758     Lactic acid 3.0 mmol/L     Narrative:       Cancel second order for specimen if the initial level is less  than 2.10mEq/L    CBC and differential [[id number]]  (Abnormal) Collected:  02/11/19 0734    Specimen:  Blood Updated:  02/11/19 0741     WBC 19.71 x10 3/uL      Hgb 15.0 g/dL      Hematocrit 41.6 %      Platelets 270 x10 3/uL      RBC 4.91 x10 6/uL      MCV 88.6 fL      MCH 30.5 pg      MCHC 34.5 g/dL      RDW 12 %      MPV 10.4 fL      Neutrophils 70.7 %      Lymphocytes Automated  19.4 %      Monocytes 8.2 %      Eosinophils Automated 0.8  %      Basophils Automated 0.3 %      Immature Granulocyte 0.6 %      Nucleated RBC 0.0 /100 WBC      Neutrophils Absolute 13.94 x10 3/uL      Abs Lymph Automated 3.83 x10 3/uL      Abs Mono Automated 1.62 x10 3/uL      Abs Eos Automated 0.15 x10 3/uL      Absolute Baso Automated 0.06 x10 3/uL      Absolute Immature Granulocyte 0.11 x10 3/uL      Absolute NRBC 0.00 x10 3/uL             Radiology:   Radiological Procedure reviewed.     Signed by: Donnetta Hutching

## 2019-02-11 NOTE — ED Notes (Signed)
Bed: A09  Expected date:   Expected time:   Means of arrival:   Comments:  Medic 431

## 2019-02-11 NOTE — ED Notes (Signed)
Pt unable to tolerate PO contrast for CT scan, MD notified.

## 2019-02-11 NOTE — ED Triage Notes (Signed)
Pt p/w abdominal pain, constipation and inability to urinate via EMS. Reported tympanic fever of 100.4 per EMS. No SOB, cough or fever noted upon admission. Abdomen is round, firm and distended. Nausea and pain 6/10 in abdomen. Administered IV zofran, morphine, LR and steroids. Bladder scan 200 mL. Awaiting CT scan of abdomen with contrast. Vitals stable. Pt resting comfortably with wife at bedside.

## 2019-02-11 NOTE — ED Provider Notes (Signed)
History     Chief Complaint   Patient presents with   . Abdominal Pain   . Fever     acute onset lower abdominal pain nausea urge to defecate  Also decreased urine output last bowel movement several days ago.  Patient feels like he needs to have a bowel movement  Wife notes he has some confusion.  Patient had periods of confusion since having a brain surgery in the past patient tried to manually disimpact himself without relief patient is a retired gynecologist  severe severity comes by EMS history per patient      Past Medical History:   Diagnosis Date   . Avascular necrosis 2008    R hip - did not require surgery   . Brain tumor 2003    Taken out in 2003, has returned    . Cold intolerance    . Color blindness    . DJD (degenerative joint disease)     Lumbar / Sacral   . Dry eyes     uses restasis eyedrops which helps (RF, CCP, CRP and ESR have been wnl in past)   . Eczema 03/29/2016    Right thumb    . Encounter for hepatitis C screening test for low risk patient 08/2016    negative   . Encounter for pharmacogenetic testing 10/2015    [facility]   . Hemorrhoids    . Lateral epicondylitis     Left   . Osteoporosis    . Screening PSA (prostate specific antigen) 02/14/2018    0.4   . Tinnitus     high pitched buzzing comes and goes - started after craniotomy for meningioma       Past Surgical History:   Procedure Laterality Date   . COLONOSCOPY  2008   . EXCISION, LESION  12/03/2008    benign lesion   . ORCHIOPEXY Left 1980    for intermittent torsion   . Subtotal resection of posterior sagittal meningioma  12/05/2002    Partial brain resection secondary to meningioma at [facility]   . TONSILLECTOMY AND ADENOIDECTOMY  as a child   . TOOTH EXTRACTION Right 06/2018       Family History   Problem Relation Age of Onset   . Asthma Daughter    . Tuberculosis Mother    . Dementia Mother    . Migraines Sister        Social  Social History     Tobacco Use   . Smoking status: Never Smoker   . Smokeless tobacco: Never Used    Substance Use Topics   . Alcohol use: No     Comment: rare   . Drug use: Yes     Comment: uses MJ for pain daily       .     Allergies   Allergen Reactions   . Bextra [Valdecoxib] Rash       Home Medications     Med List Status:  In Progress Set By: Janit Pagan, RN at 02/11/2019  7:38 AM                albuterol (PROAIR HFA) 108 (90 Base) MCG/ACT inhaler     INHALE ONE PUFF BY MOUTH EVERY FOUR TO SIX HOURS AS NEEDED FOR WHEEZING     amLODIPine (NORVASC) 5 MG tablet     TAKE ONE TABLET BY MOUTH TWICE DAILY      BELBUCA 450 MCG Film  Buprenorphine (BUTRANS) 15 MCG/HR Patch Weekly          Calcium Carbonate-Vitamin D (CALCIUM + D PO)     Take by mouth. 500/400mg   daily      carvedilol (COREG) 6.25 MG tablet     TAKE TWO TABLETS BY MOUTH TWICE DAILY WITH FOOD      chlorhexidine (PERIDEX) 0.12 % solution          clonazePAM (KLONOPIN) 0.5 MG tablet     Take 0.25 mg by mouth daily as needed.         Coenzyme Q10 100 MG capsule     Take 100 mg by mouth daily.     ezetimibe (ZETIA) 10 MG tablet     TAKE ONE TABLET BY MOUTH ONE TIME DAILY      finasteride (PROSCAR) 5 MG tablet     Take 1 tablet (5 mg total) by mouth daily     gabapentin (NEURONTIN) 300 MG capsule     Take 900 mg by mouth 3 (three) times daily        hydrocortisone (CORTEF) 20 MG tablet     Take 70 mg by mouth daily     levothyroxine (SYNTHROID) 150 MCG tablet     Take 1 tablet daily at 6pm.     metoclopramide (REGLAN) 5 MG tablet     Take 1 tablet (5 mg total) by mouth 4 (four) times daily as needed (nausea)     montelukast (SINGULAIR) 10 MG tablet     Take one tablet by mouth at bedtime as directed     Multiple Vitamins-Minerals (CENTRUM SILVER PO)     Take 1 tablet by mouth daily.       nystatin (MYCOSTATIN) 100000 UNIT/ML suspension     SWISH IN MOUTH AND RETAIN FOR AS LONG AS POSSIBLE (SEVERAL MINUTES) BEFORE SWALLOWING 4 TIMES DAILY     pilocarpine (SALAGEN) 5 MG tablet     TAKE ONE TABLET BY MOUTH THREE TIMES DAILY      prochlorperazine  (COMPAZINE) 10 MG tablet     Take 10 mg by mouth every 6 (six) hours as needed     pyridoxine (B-6) 100 MG tablet     Take 100 mg by mouth daily.     ramipril (ALTACE) 10 MG capsule     Take 2 capsules (20 mg total) by mouth nightly     simvastatin (ZOCOR) 20 MG tablet     TAKE ONE TABLET BY MOUTH AT BEDTIME      Somatropin (NUTROPIN AQ NUSPIN 10) 10 MG/2ML Solution     Inject 0.3 mg into the skin daily     SYMBICORT 80-4.5 MCG/ACT inhaler     Inhale 2 puffs into the lungs two times daily     testosterone (ANDROGEL) 25 MG/2.5GM (1%) Gel     2.5 mg daily.         traMODol (ULTRAM-ER) 300 MG 24 hr tablet     Take 100 mg by mouth daily        VITAMIN D, CHOLECALCIFEROL, PO     Take 5,000 Units by mouth daily.              Review of Systems   Constitutional: Negative for fever.   HENT: Negative for congestion.    Respiratory: Negative for shortness of breath.    Cardiovascular: Negative for chest pain.   Gastrointestinal: Positive for abdominal pain and nausea.   Genitourinary: Negative for dysuria.  Musculoskeletal: Negative.    Skin: Negative for rash.   Neurological: Negative for headaches.   Psychiatric/Behavioral: Positive for confusion. Negative for dysphoric mood.   All other systems reviewed and are negative.      Physical Exam    BP: 139/77, Heart Rate: 90, Temp: 98.4 F (36.9 C), SpO2: 100 %, Weight: 80.5 kg     Physical Exam  Constitutional:       General: He is in acute distress.      Comments: In pain     HENT:      Head: Normocephalic.   Eyes:      Conjunctiva/sclera: Conjunctivae normal.   Neck:      Comments: Normal Inspection  Cardiovascular:      Rate and Rhythm: Normal rate and regular rhythm.      Heart sounds: Normal heart sounds.   Pulmonary:      Breath sounds: Normal breath sounds.   Abdominal:      General: Bowel sounds are normal. There is distension.      Palpations: Abdomen is soft.      Tenderness: There is abdominal tenderness.   Musculoskeletal:      Comments: Normal Upper Extremity  Inspection   Skin:     General: Skin is warm and dry.   Neurological:      General: No focal deficit present.      Mental Status: He is alert.           MDM and ED Course     ED Medication Orders (From admission, onward)    Start Ordered     Status Ordering Provider    02/11/19 0913 02/11/19 0913  iohexol (OMNIPAQUE) 350 MG/ML injection 100 mL  IMG once as needed     Route: Intravenous  Ordered Dose: 100 mL     Last MAR action:  Imaging Agent Given Chase Picket    02/11/19 0902 02/11/19 0901  morphine injection 8 mg  Once     Route: Intravenous  Ordered Dose: 8 mg     Last MAR action:  Given Chase Picket    02/11/19 0836 02/11/19 0835  ondansetron (ZOFRAN) injection 4 mg  Once     Route: Intravenous  Ordered Dose: 4 mg     Last MAR action:  Given Chase Picket    02/11/19 1610 02/11/19 0810  lactated ringers bolus 1,000 mL  Once     Route: Intravenous  Ordered Dose: 1,000 mL     Last [facility] action:  [employer] Pete Glatter J    02/11/19 0751 02/11/19 0750  hydrocortisone sodium succinate (Solu-CORTEF) injection 50 mg  Once     Route: Intravenous  Ordered Dose: 50 mg     Last MAR action:  Given Chase Picket    02/11/19 0751 02/11/19 0750  morphine injection 8 mg  Once     Route: Intravenous  Ordered Dose: 8 mg     Last MAR action:  Given Chase Picket    02/11/19 0729 02/11/19 0728  iohexol (OMNIPAQUE) 12 MG/ML oral solution 1,000 mL  Once     Route: Oral  Ordered Dose: 1,000 mL     Last [facility] action:  Imaging Agent Given Chase Picket    02/11/19 9604 02/11/19 0727  lactated ringers bolus 1,000 mL  Once     Route: Intravenous  Ordered Dose: 1,000 mL     Last MAR action:  [address] Alyviah Crandle J  02/11/19 0728 02/11/19 0727  ondansetron (ZOFRAN) injection 4 mg  Once     Route: Intravenous  Ordered Dose: 4 mg     Last MAR action:  Given Chase Picket             MDM      ED Course as of Feb 11 1115   Mon Feb 11, 2019   0751 There is a fair amount of soft stool in the rectal vault I did  try to disimpact somewhat by breaking up the stoolWife did request that some steroids be given as he has not had his morning steroid dose of observation for some pain medication    [PM]   1020 Persistent lower abdominal tenderness I recommend admission given abnormal lab parameters continued pain patient is hesitant would like to discuss with his wife    [PM]      ED Course User Index  [PM] Chase Picket, MD       Will be admitted given his leukocytosis elevated lactic and inflammation for serial abdominal exam GI consultation treatment of obstipation      Procedures    Clinical Impression & Disposition     Clinical Impression  Final diagnoses:   Leukocytosis, unspecified type   Obstipation        ED Disposition     ED Disposition Condition Date/Time Comment    Observation  Mon Feb 11, 2019 11:15 AM Admitting Physician: Thermon Leyland [[id number]]   Diagnosis: Obstipation [347425]   Estimated Length of Stay: < 2 midnights   Tentative Discharge Plan?: Home or Self Care [1]   Patient Class: Observation [104]             New Prescriptions    No medications on file                 Chase Picket, MD  02/11/19 1117

## 2019-02-11 NOTE — EDIE (Signed)
COLLECTIVE?NOTIFICATION?02/11/2019 07:11?Shannon, PORTELA [name]?MRN: [id number]    Criteria Met      Narx Scores Alert    Security and Safety  No recent Security Events currently on file    ED Care Guidelines  There are currently no ED Care Guidelines for this patient. Please check your facility's medical records system.        Prescription Monitoring Program  501??- Narcotic Use Score  320??- Sedative Use Score  332??- Stimulant Use Score  250??- Overdose Risk Score  - All Scores range from 000-999 with 75% of the population scoring < 200 and on 1% scoring above 650  - The last digit of the narcotic, sedative, and stimulant score indicates the number of active prescriptions of that type  - Higher Use scores correlate with increased prescribers, pharmacies, mg equiv, and overlapping prescriptions  - Higher Overdose Risk Scores correlate with increased risk of unintentional overdose death   Concerning or unexpectedly high scores should prompt [name] review of the PMP record; this does not constitute checking PMP for prescribing purposes.      E.D. Visit Count (12 mo.)  Facility Visits    [facility] 3   Total 3   Note: Visits indicate total known visits.      Recent Emergency Department Visit Summary  Date Facility [facility] Type Diagnoses or Chief Complaint   Feb 11, 2019 [facility]      abdominal pain, fever      Aug 09, 2018 [facility]inski [place] [facility]      nausea      Emesis      Vomiting, unspecified      Jul 30, 2018 Tyson Babinski [place] [facility]      Vomiting      Emesis      Dehydration          Recent Inpatient Visit Summary  No recorded inpatient visits.     Care Team  Provider Specialty Phone Fax Service Dates   Orlin Hilding, Pearletha Furl, (MD) Merton Border, MD Internal Medicine   Current      Collective Portal  This patient has registered at the [facility] Emergency Department   For more information visit:  [email or web address]     PLEASE NOTE:     1.   Any care recommendations and other clinical information are provided as guidelines or for historical purposes only, and providers should exercise their own clinical judgment when providing care.    2.   You may only use this information for purposes of treatment, payment or health care operations activities, and subject to the limitations of applicable Collective Policies.    3.   You should consult directly with the organization that provided [name] care guideline or other clinical history with any questions about additional information or accuracy or completeness of information provided.    ? 2020 [place], [employer] - [email or web address]

## 2019-02-12 ENCOUNTER — Observation Stay: Payer: Medicare Other

## 2019-02-12 ENCOUNTER — Encounter: Payer: Self-pay | Admitting: Internal Medicine

## 2019-02-12 LAB — CBC WITH MANUAL DIFFERENTIAL
Absolute NRBC: 0 10*3/uL (ref 0.00–0.00)
Atypical Lymphocytes %: 2 %
Atypical Lymphocytes Absolute: 0.21 10*3/uL — ABNORMAL HIGH (ref 0.00–0.00)
Band Neutrophils Absolute: 0.52 10*3/uL (ref 0.00–1.00)
Band Neutrophils: 5 %
Basophils Absolute Manual: 0 10*3/uL (ref 0.00–0.08)
Basophils Manual: 0 %
Cell Morphology: NORMAL
Eosinophils Absolute Manual: 0.31 10*3/uL (ref 0.00–0.44)
Eosinophils Manual: 3 %
Hematocrit: 37.7 % (ref 37.6–49.6)
Hgb: 12.6 g/dL (ref 12.5–17.1)
Lymphocytes Absolute Manual: 3.1 10*3/uL (ref 0.42–3.22)
Lymphocytes Manual: 30 %
MCH: 30.7 pg (ref 25.1–33.5)
MCHC: 33.4 g/dL (ref 31.5–35.8)
MCV: 91.7 fL (ref 78.0–96.0)
MPV: 10.5 fL (ref 8.9–12.5)
Monocytes Absolute: 0.93 10*3/uL — ABNORMAL HIGH (ref 0.21–0.85)
Monocytes Manual: 9 %
Neutrophils Absolute Manual: 5.27 10*3/uL (ref 1.10–6.33)
Nucleated RBC: 0 /100 WBC (ref 0.0–0.0)
Platelet Estimate: NORMAL
Platelets: 173 10*3/uL (ref 142–346)
RBC: 4.11 10*6/uL — ABNORMAL LOW (ref 4.20–5.90)
RDW: 12 % (ref 11–15)
Segmented Neutrophils: 51 %
WBC: 10.34 10*3/uL — ABNORMAL HIGH (ref 3.10–9.50)

## 2019-02-12 LAB — BASIC METABOLIC PANEL
Anion Gap: 9 (ref 5.0–15.0)
BUN: 17 mg/dL (ref 9–28)
CO2: 24 mEq/L (ref 22–29)
Calcium: 8.2 mg/dL — ABNORMAL LOW (ref 8.5–10.5)
Chloride: 106 mEq/L (ref 100–111)
Creatinine: 1.1 mg/dL (ref 0.7–1.3)
Glucose: 98 mg/dL (ref 70–100)
Potassium: 3.7 mEq/L (ref 3.5–5.1)
Sodium: 139 mEq/L (ref 136–145)

## 2019-02-12 LAB — HEPATIC FUNCTION PANEL
ALT: 33 U/L (ref 0–55)
AST (SGOT): 25 U/L (ref 5–34)
Albumin/Globulin Ratio: 1.8 (ref 0.9–2.2)
Albumin: 3.5 g/dL (ref 3.5–5.0)
Alkaline Phosphatase: 50 U/L (ref 38–106)
Bilirubin Direct: 0.5 mg/dL (ref 0.0–0.5)
Bilirubin Indirect: 1 mg/dL (ref 0.2–1.0)
Bilirubin, Total: 1.5 mg/dL — ABNORMAL HIGH (ref 0.2–1.2)
Globulin: 1.9 g/dL — ABNORMAL LOW (ref 2.0–3.6)
Protein, Total: 5.4 g/dL — ABNORMAL LOW (ref 6.0–8.3)

## 2019-02-12 LAB — GFR: EGFR: >60

## 2019-02-12 LAB — MAGNESIUM: Magnesium: 1.9 mg/dL (ref 1.6–2.6)

## 2019-02-12 LAB — CYTOMEGALOVIRUS ANTIBODY, IGM: CMV Ab, IgM: <8

## 2019-02-12 LAB — PHOSPHORUS: Phosphorus: 3.8 mg/dL (ref 2.3–4.7)

## 2019-02-12 MED ORDER — METRONIDAZOLE 500 MG PO TABS
500.00 mg | ORAL_TABLET | Freq: Three times a day (TID) | ORAL | 0 refills | Status: AC
Start: 2019-02-12 — End: 2019-02-20

## 2019-02-12 MED ORDER — IBUPROFEN 600 MG PO TABS
600.00 mg | ORAL_TABLET | Freq: Once | ORAL | Status: AC
Start: 2019-02-12 — End: 2019-02-12
  Administered 2019-02-12: 600 mg via ORAL
  Filled 2019-02-12: qty 1

## 2019-02-12 MED ORDER — POLYETHYLENE GLYCOL 3350 17 G PO PACK
17.00 g | PACK | Freq: Every day | ORAL | 0 refills | Status: DC
Start: 2019-02-12 — End: 2019-12-27

## 2019-02-12 MED ORDER — LEVOFLOXACIN 500 MG PO TABS
500.00 mg | ORAL_TABLET | Freq: Every day | ORAL | 0 refills | Status: AC
Start: 2019-02-13 — End: 2019-02-21

## 2019-02-12 NOTE — Progress Notes (Signed)
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 Mild adjacent perirectal stranding  Takes ibuprofen ~ 3-4x/week for lower back pain  US doppler 6/23: normal  CEA 2.5    -Leukocytosis, on chronic steroids, improved  -Elevated lactic acid, resolved    Plan:   Advance diet as tolerated. Reviewed with patient low FODMAP diet. Add Miralax if needed.   CMV IGM, LFTs pending  Levaquin, flagyl x 10 days  Protonix daily  NSAIDs per patient's request    Recommend EGD and colonoscopy in ~ 4-6 weeks. Patient will call our office if interested. He also follows another GI practice.     D/c to home today if able to tolerate lunch   D/w Dr. Theodoro Doing    Subjective:   Tolerated clears for breakfast. Awaiting for lunch. Has several BMs post enemas yesterday.   LLQ pain has improved  Would like to go home      Endoscopy:     EGD: 15 yrs ago    Colonoscopy: 10 yrs ago  Cologuard 5 yrs ago    ERCP:    Medications:     Current Facility-Administered Medications   Medication Dose Route Frequency   . amLODIPine  5 mg Oral Q12H SCH   . Buprenorphine HCl  450 mcg Buccal Q12H SCH   . carvedilol  6.25 mg Oral Q12H SCH   . enoxaparin  40 mg Subcutaneous Daily   . finasteride  5 mg Oral Daily   . fluticasone furoate-vilanterol  1 puff Inhalation QAM   . hydrocortisone  15 mg Oral Daily   . levoFLOXacin  500 mg Intravenous Q24H   . levothyroxine  150 mcg Oral Daily at 0600   . metroNIDAZOLE  500 mg Intravenous Q8H   .  montelukast  10 mg Oral QHS   . pantoprazole  40 mg Intravenous Daily   . ramipril  20 mg Oral QHS   . simvastatin  20 mg Oral QHS       Review of Systems:   A comprehensive review of systems was obtained from the patient  The patient denies any additional changes to their otic, opthalmologic, dermatologic, pulmonary, cardiac, genitourinary, musculoskeletal, hematologic, constitutional, or psychiatric systems.    Physical Exam:     Vitals:    02/12/19 0941   BP: 126/61   Pulse: (!) 53   Resp:    Temp:    SpO2:        General appearance - alert, in no distress,  oriented to person, place, and time  Eyes - pupils equal and reactive, sclera anicteric  Abdomen - soft, nontender, nondistended    Labs:     Results     Procedure Component Value Units Date/Time    CBC WITH MANUAL DIFFERENTIAL [[id number]]  (Abnormal) Collected:  02/12/19 0529     Updated:  02/12/19 0739     WBC 10.34 x10 3/uL  Hgb 12.6 g/dL      Hematocrit 95.6 %      Platelets 173 x10 3/uL      RBC 4.11 x10 6/uL      MCV 91.7 fL      MCH 30.7 pg      MCHC 33.4 g/dL      RDW 12 %      MPV 10.5 fL      Nucleated RBC 0.0 /100 WBC      Absolute NRBC 0.00 x10 3/uL      Segmented Neutrophils 51 %      Band Neutrophils 5 %      Lymphocytes Manual 30 %      Monocytes Manual 9 %      Eosinophils Manual 3 %      Basophils Manual 0 %      Atypical Lymphocytes % 2 %      Neutrophils Absolute Manual 5.27 x10 3/uL      Band Neutrophils Absolute 0.52 x10 3/uL      Lymphocytes Absolute Manual 3.10 x10 3/uL      Monocytes Absolute 0.93 x10 3/uL      Eosinophils Absolute Manual 0.31 x10 3/uL      Basophils Absolute Manual 0.00 x10 3/uL      Atypical Lymphocytes Absolute 0.21 x10 3/uL      Cell Morphology Normal     Platelet Estimate Normal    Magnesium [[id number]] Collected:  02/12/19 0529    Specimen:  Blood Updated:  02/12/19 0642     Magnesium 1.9 mg/dL     Phosphorus [[id number]] Collected:  02/12/19 0529    Specimen:  Blood Updated:  02/12/19 0642     Phosphorus 3.8  mg/dL     GFR [[id number]] Collected:  02/12/19 0529     Updated:  02/12/19 0642     EGFR >60.0    Basic Metabolic Panel [[id number]]  (Abnormal) Collected:  02/12/19 0529    Specimen:  Blood Updated:  02/12/19 0641     Glucose 98 mg/dL      BUN 17 mg/dL      Creatinine 1.1 mg/dL      Calcium 8.2 mg/dL      Sodium 034 mEq/L      Potassium 3.7 mEq/L      Chloride 106 mEq/L      CO2 24 mEq/L      Anion Gap 9.0    TSH [[id number]] Collected:  02/11/19 0734     Updated:  02/11/19 1711     TSH 0.47 uIU/mL     CEA [[id number]] Collected:  02/11/19 0734     Updated:  02/11/19 1652     CEA 2.5 ng/mL     Lactic acid, plasma [[id number]] Collected:  02/11/19 1626    Specimen:  Blood Updated:  02/11/19 1646     Lactic Acid 1.8 mmol/L     Blood Culture Aerobic/Anaerobic #2 [[id number]] Collected:  02/11/19 1100    Specimen:  Arm from Blood, Venipuncture Updated:  02/11/19 1430    Narrative:       ARM  1 BLUE+1 PURPLE    Blood Culture Aerobic/Anaerobic #1 [[id number]] Collected:  02/11/19 0852    Specimen:  Arm from Blood, Venipuncture Updated:  02/11/19 1430    Narrative:       ARM  1 BLUE+1 PURPLE            Radiology:   Radiological Procedure reviewed.    Signed by Rae Roam  Rogelia Mire

## 2019-02-12 NOTE — Plan of Care (Signed)
Problem: Discharge Barriers  Goal: Patient will be discharged home or other facility with appropriate resources  Outcome: Adequate for Discharge  Flowsheets (Taken 02/12/2019 1436)  Discharge to home or other facility with appropriate resources:   Provide appropriate patient education   Provide information on available health resources   Initiate discharge planning    Pt discharging to home this afternoon. A/Ox4. VSS. No complaints of N/V/D. Last BM 06/22. Tolerating diet. Discharge instructions/AVS reviewed with pt. Pt verbalizes understanding and agreement. IV removed. Belongings gathered. Awaiting transport.

## 2019-02-12 NOTE — Progress Notes (Signed)
Patient did not agree with OUTPATIENT/OBSERVATION status and refused to sign the letter.    Discussed with attending physician, UR CM and Physician Advisor. Status is appropriate. CM documented refusal and left patient a copy of OBS letter.    Conception Chancy, MSSW  Case Manager - [facility]  [phone])

## 2019-02-12 NOTE — Discharge Instr - AVS First Page (Signed)
Reason for your Hospital Admission:  Acute onset of Left lower quadrant abdominal pain likely due to constipation and possibly infection with peri-rectal thickening      Instructions for after your discharge:  Take antibiotics as prescribed to complete a 10 day course.  Take Miralax daily to help prevent constipation.   Follow-up with Dr. Orlin Hilding in 2 weeks.  Follow-up with GI ([facility] or [facility]) in 3-4 weeks to schedule for EGD and colonosocpy.

## 2019-02-12 NOTE — Progress Notes (Signed)
Pt is AOx4, VSS, independent, NPO. Pt c/o moderate headache, given one time dose of ibuprofen with good effect (pt states "tylenol doesn't help my headaches"). Pts IV in R FA is CDI infusing fluids and abx as ordered. Per pt: no BM during this shift. Pts bed in lowest position, call bell and bedside table within reach. Will continue to monitor.

## 2019-02-12 NOTE — Discharge Summary (Signed)
[facility]      Patient: Shannon Neighbor, Shannon [name]  Admission Date: 02/11/2019   DOB: 04-18-1951  Discharge Date: 02/12/2019    MRN: [id number]  Discharge Attending: Thermon Leyland, Shannon [name]   Referring Physician: Modesto Charon, Shannon [name]  PCP: Modesto Charon, Shannon [name]       DISCHARGE SUMMARY     Discharge Information and Hospital Course   Admission Diagnosis:   Obstipation [K59.00]  Leukocytosis, unspecified type [D72.829]    Discharge Diagnosis:   Abdominal pain likely secondary to constipation  Leukocytosis, resolving due to chronic steroids versus infection  Elevated lactic acid that normalized with mesenteric ischemia ruled out    Admission Condition: fair  Discharge Condition: good  Functional Status: Patient is independent with mobility/ambulation, transfers, ADL's, IADL's.      Presentation History   Shannon Enid Derry, Shannon [name] is a 68 y.o. male, retired gynecologist with a PMHx of Asthma, chronic opiods,  who presented with acute onset of sudden onset of severe abdominal pain ("left lower quadrant') that started at 1 AM associated with nausea and dry heaves.  CT of the abdomen and pelvis in the ER showed large stool content with mild adjacent perirectal stranding.  He received enemas in the ER is and now having multiple bowel movements.  My evaluation he states his pain is significantly better after having bowel movements.  Denies any nausea and wondering whether his diet can be advanced from clear liquids.    ED: Afebrile, normotensive. Labs showing WBC of 19.71, elevated lactic acid level (3.0), relatively normal LFT's and normal lipase. CT of a/p with IV and PO contrast shows large stool content in the colon with moderate stool distention of the rectum, mild adjacent perirectal stranding, non-obstructive right renal calculi and fatty infiltration of the liver. He received 2 L NS bolus, IV morphine 8 mg x 2, IV zofran x 2 and Solu-Cortif. Blood cultures were drawn.     See HPI for details.    Hospital  Course (0 Days)     Patient started to have bowel movements yesterday. He tolerated a CLD yesterday and a low fiber diet for breakfast and lunch. He denies any further abdominal pain and resolved with having bowel movements. Given the elevated lactic acid and sudden onset of abdominal pain, mesenteric duplex was done and it did not show any evidence of ischemia. He states she had a fever yesterday on arrival to ER but none documented and given his leukocytosis and peri=rectral straning, emperically treated with IV levaquin and flagyl. He did not have any fevers overnight and his leukocytosis is nearly resolved now. He would like to continue with antibiotics which I do not think is unreasonable. He did not want any GI endoscopic procedure during this hospital stay and would like to follow-up with GI as outpatient to get this scheduled.       History of hypertension:  He will continue with his home antihypertensives.  History of hypothyroidism:  TSH in normal range with continuation of his levothyroxine dose.  History of chronic lower back pain: On Belbuca BID, has been taking ibuprofen 3-4x/week.  Continue to request ibuprofen while in the hospital.   History of adrenal insufficiency on chronic hydrocortisone: BP stable, continue with Cortef.  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      Carmina Miller, Shannon [name]    02/12/2019 9:30 AM      CT Abdomen Pelvis W IV And PO Cont   Final Result       1. Large stool content in the colon with moderate  stool distention of   the rectum. Mild adjacent perirectal stranding. Findings may be   secondary to constipation.   2. Nonobstructive right renal calculi. Left renal cyst.   3. Fatty infiltration of the liver.      Kennyth Lose, Shannon [name]    02/11/2019 9:45 AM          Treatment Team:   Attending Provider: Thermon Leyland, Shannon [name]       Discharge Medications     Discharge Medications:     Medication List      START taking these medications    levoFLOXacin 500 MG tablet  Commonly known as:  LEVAQUIN  Take 1 tablet (500 mg total) by mouth daily for 8 days  Start taking on:  February 13, 2019     metroNIDAZOLE 500 MG tablet  Commonly known as:  FLAGYL  Take 1 tablet (500 mg total) by mouth 3 (three) times daily for 8 days     polyethylene glycol 17 g packet  Commonly known as:  MIRALAX  Take 17 g by mouth daily        CONTINUE taking these medications    albuterol 108 (90 Base) MCG/ACT inhaler  Commonly known as:  ProAir HFA  INHALE ONE PUFF BY MOUTH EVERY FOUR TO SIX HOURS AS NEEDED FOR WHEEZING     amLODIPine 5 MG tablet  Commonly known as:  NORVASC  TAKE ONE TABLET BY MOUTH TWICE DAILY     Belbuca 450 MCG Film  Generic drug:  Buprenorphine HCl     CALCIUM + D PO     carvedilol 6.25 MG tablet  Commonly known as:  COREG  TAKE TWO TABLETS BY MOUTH TWICE DAILY WITH FOOD     CENTRUM SILVER PO     clonazePAM 0.5 MG tablet  Commonly known as:  KlonoPIN     Coenzyme Q10 100 MG capsule     ezetimibe 10 MG tablet  Commonly known as:  ZETIA  TAKE ONE TABLET BY MOUTH ONE TIME DAILY     finasteride 5 MG tablet  Commonly known as:  PROSCAR  Take 1 tablet (5 mg total) by mouth daily     hydrocortisone 10 MG tablet  Commonly known as:  CORTEF     levothyroxine 150 MCG tablet  Commonly known as:  SYNTHROID  Take 1 tablet daily at 6pm.     montelukast 10 MG tablet  Commonly known as:  SINGULAIR  Take one tablet by mouth at bedtime as directed     pyridoxine 100 MG tablet  Commonly known as:  B-6     ramipril 10 MG capsule  Commonly known as:   ALTACE  Take 2 capsules (20 mg total) by mouth nightly     simvastatin 20 MG tablet  Commonly known as:  ZOCOR  TAKE ONE TABLET BY MOUTH AT BEDTIME     Symbicort 80-4.5 MCG/ACT inhaler  Generic drug:  budesonide-formoterol  Inhale  2 puffs into the lungs two times daily     testosterone 25 MG/2.5GM (1%) Gel  Commonly known as:  ANDROGEL     VITAMIN D (CHOLECALCIFEROL) PO        STOP taking these medications    metoclopramide 5 MG tablet  Commonly known as:  REGLAN     nystatin 100000 UNIT/ML suspension  Commonly known as:  MYCOSTATIN     prochlorperazine 10 MG tablet  Commonly known as:  COMPAZINE           Where to Get Your Medications      These medications were sent to [id number] [place] - [address]  [address], [place]  [address]    Phone:  [phone]    levoFLOXacin 500 MG tablet   metroNIDAZOLE 500 MG tablet   polyethylene glycol 17 g packet          Patient Lines/Drains/Airways Status    Active PICC Line / CVC Line / PIV Line / Drain / Airway / Intraosseous Line / Epidural Line / ART Line / Line / Wound / Pressure Ulcer / NG/OG Tube     Name:   Placement date:   Placement time:   Site:   Days:    Peripheral IV 02/11/19 Posterior;Right Forearm   02/11/19    2218    Forearm   less than 1                             Progress Note/Physical Exam at Discharge     Subjective: States his abdominal pain has not occurred since yesterday.  Had multiple bowel movements yesterday.  Able to tolerate his clear liquid today and low fiber diet today.  Denies any further nausea or dry heaves.    Vitals:    02/11/19 2131 02/12/19 0535 02/12/19 0941 02/12/19 1237   BP: 133/75 114/56 126/61 125/63   Pulse: 66 (!) 47 (!) 53 (!) 53   Resp:  17     Temp:  97.9 F (36.6 C)  98.2 F (36.8 C)   TempSrc:  Oral  Oral   SpO2:  94%  93%   Weight:       Height:           General: NAD, AAOx3  HEENT: perrla, eomi, sclera anicteric, OP: Clear, MMM  Neck: supple, FROM, no LAD  Cardiovascular:  RRR, no m/r/g  Lungs: CTAB, no w/r/r  Abdomen: soft, +BS, NT/ND, no masses, no g/r  Extremities: no C/C/E  Skin: no rashes or lesions noted  Neuro: CN 2-12 intact; No Focal neurological deficits       Diagnostics     Labs/Studies Pending at Discharge: No    Last Labs   Recent Labs   Lab 02/12/19  0529 02/11/19  0734   WBC 10.34* 19.71*   RBC 4.11* 4.91   Hgb 12.6 15.0   Hematocrit 37.7 43.5   MCV 91.7 88.6   Platelets 173 270       Recent Labs   Lab 02/12/19  0529 02/11/19  0734   Sodium 139 138   Potassium 3.7 4.1   Chloride 106 104   CO2 24 22   BUN 17 22   Creatinine 1.1 1.1   Glucose 98 131*   Calcium 8.2* 9.4   Magnesium 1.9  --        Microbiology Results  Procedure Component Value Units Date/Time    Blood Culture Aerobic/Anaerobic #1 [[id number]] Collected:  02/11/19 0852    Specimen:  Arm from Blood, Venipuncture Updated:  02/11/19 1430    Narrative:       ARM  1 BLUE+1 PURPLE    Blood Culture Aerobic/Anaerobic #2 [[id number]] Collected:  02/11/19 1100    Specimen:  Arm from Blood, Venipuncture Updated:  02/11/19 1430    Narrative:       ARM  1 BLUE+1 PURPLE           Patient Instructions   Discharge Diet: regular diet  Discharge Activity:  activity as tolerated    Follow Up Appointment:  Follow-up Information     Broderick, Pearletha Furl, Shannon [name]. Schedule an appointment as soon as possible for a visit in 2 week(s).    Specialty:  Internal Medicine  Contact information:  [address]  [facility] [address]  [place] [address]  [phone]                    Time spent examining patient, discussing with patient/family regarding hospital course, chart review, reconciling medications and discharge planning: 45 minutes.    SignedThermon Leyland, Shannon [name]  2:29 PM 02/12/2019

## 2019-02-13 NOTE — UM Notes (Signed)
This clinical review is based on/compiled from documentation provided by the treatment team within the patient's medical record.    Raymond Gurney, RN, BSN  Clinical Case Manager  [facility]    [address]    [place], [place] [address]  NPI: [id number]  Tax ID: [id number]  Phone: [phone]  Fax: [phone]    Please use fax number [phone] to provide authorization for hospital services or to request additional information.        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  Patient feels like he needs to have a bowel movement  Wife notes he has some confusion.  Patient had periods of confusion since having a brain surgery in the past patient tried to manually disimpact himself without relief patient is a retired gynecologist  severe severity comes by EMS history per patient      ED Triage Vitals   Enc Vitals Group      BP 02/11/19 0727 139/77      Heart Rate 02/11/19 0727 90      Resp Rate 02/11/19 1305 18      Temp 02/11/19 0727 98.4 F (36.9 C)      Temp Source 02/11/19 0727 Oral      SpO2 02/11/19 0727 100 %      Weight 02/11/19 0734 80.5 kg (177 lb 7.5 oz)      Height 02/11/19 0727 1.778 m (5\' 10" )      Pain Score 02/11/19 0727 6     ADMISSION DIAGNOSIS:   02/11/19 1115  Place (admit) for Observation Services (Adult Observation Admit Panel (FO)) Once    Status:    Question Answer Comment   Admitting Physician ARSHID, BALAL    Diagnosis  Obstipation    Estimated Length of Stay < 2 midnights    Tentative Discharge Plan? 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  I expect an inpatient to remain in the hospital for less than 2 midnights.  I started evaluating the patient at 1100 am on 22 June , 2020.    Abdominal pain likely secondary to constipation  Leukocytosis possibly due to chronic steroids versus infection  Elevated lactic acid that is trending down, rule out ischemia    -Pain has improved after having multiple bowel movements  -Ultrasound of abdomen to rule out mesenteric stenosis  -Empiric antibiotics with Levaquin and Flagyl.  Follow blood cultures  -Clear liquid diet and will advance as tolerated  -PPI  -GI following      LABS:      02/11/2019 07:34   WBC 19.71 (H)  MEDS:   Solu-cortef 50mg  IV  LR 1,088ml IV bolus x2  Flagyl 500mg  IV  Morphine 8mg  IV x2  Zofran 4mg  IV x2  Protonix 40mg  IV      IMAGING:  CT abdomen/pelvis  1. Large stool content in the colon with moderate stool distention of  the rectum. Mild adjacent perirectal stranding. Findings may be  secondary to constipation.  2. Nonobstructive right renal calculi. Left renal cyst.  3. Fatty infiltration of the liver.

## 2019-02-18 ENCOUNTER — Encounter: Payer: Self-pay | Admitting: Internal Medicine

## 2019-02-18 NOTE — Progress Notes (Signed)
Just as I was responding, I started to have some success!   Thanks for checking in  I don't want to take up any more of your down time.  Thanks and have a good weekend.    -----Original Message-----  From: Modesto Charon [email or web address]  To: '[email or web address]' [email or web address]  Sent: Sat, Feb 16, 2019 6:51 am  Subject: RE: Checking In  I agree with Dr. Leone Brand thoughts and don't want you to be too aggressive with the enema if we don't need to     Elavil could potentially cause constipation, though if you have been on narcotics (belbuca) or tramadol for pain recently during your hospitalization or chronically I think this may also be contributing.      How long have you been taking the elavil now and have you noted a direct correlation since taking it with the constipation? I would hate to abandon ship on it if it is helping insomnia in some measure.      Make sure you are hydrating, getting lots of fiber from fruits and vegetables (or daily dose of Metamucil in addition to these if you are not regular).  If those have not been effective then Miralax is generally a very effective option. If that was not working then I would enlist Dr. Leone Brand recommendation also because other medications are sometimes used for those with chronic constipation especially in those treated for chronic pain     Gloriajean Dell, MD  [facility]  [address], [address]  [place], [place] [address]  P [phone]  F [phone]  [email or web address]                  From: [email or web address] [[email or web address]]   Sent: Friday, February 15, 2019 8:24 PM  To: Markeem Noreen A.  Subject: Re: Checking In    Tan's nurse said they are not concerned yet. They suggested Miralax one capful bid and a glycerine suppository.   No BM yet, Nausea better. Hydrating and eating.  No urge or discomfort. Did see that Elavil can cause constipation.  This is the newest drug recommended by Neurology for  sleep.  It was helping the insomnia but think I should stop Elavil?  Thanks  -----Original Message-----  From: Modesto Charon [email or web address]  To: [email or web address]  Sent: Fri, Feb 15, 2019 8:05 am  Subject: RE: Checking In  I thought these were more commercially available but only seeing the fleet variety at drugstores     Any progress in last 24 hours or word from Tan?      One of my nurses is calling the [employer] to check into this - I would imagine you could use just warm water also using supplies from regular drugstore enema kit      From: [email or web address] [[email or web address]]   Sent: Thursday, February 14, 2019 3:50 PM  To: Wyett Narine A.  Subject: Re: Checking In     Had IV all during hosp.    Slight nausea (chronic)  Fair fluid intake, apple sauce, toast  No urge to go  Good output, nice and clear  Any recommendations how to obtain soapy water enema? Drug store?  Still waiting for Tan to respond  Thanks for giving me some direction

## 2019-02-20 ENCOUNTER — Encounter: Payer: Self-pay | Admitting: Internal Medicine

## 2019-03-11 ENCOUNTER — Other Ambulatory Visit: Payer: Self-pay

## 2019-03-11 DIAGNOSIS — E782 Mixed hyperlipidemia: Secondary | ICD-10-CM

## 2019-03-11 DIAGNOSIS — I10 Essential (primary) hypertension: Secondary | ICD-10-CM

## 2019-03-11 MED ORDER — EZETIMIBE 10 MG PO TABS
10.0000 mg | ORAL_TABLET | Freq: Every day | ORAL | 2 refills | Status: DC
Start: 2019-03-11 — End: 2019-11-05

## 2019-03-11 MED ORDER — RAMIPRIL 10 MG PO CAPS
20.00 mg | ORAL_CAPSULE | Freq: Every evening | ORAL | 1 refills | Status: DC
Start: 2019-03-11 — End: 2019-08-05

## 2019-03-20 ENCOUNTER — Ambulatory Visit: Payer: Medicare Other

## 2019-03-20 NOTE — Pre-Procedure Instructions (Addendum)
.   Surgical Risk Level : (Low, Intermediate, High)  o Low    . Surgeon Testing Requirements:  o COVID    . Anesthesia Guideline Requirements:  o na        NPO per surgeon      . Specialist Notes / Test Results / Records Requested:  o na    . Recent Hospitalization / ED Visit:   o Admission 6/22    . Future Plan / Upcoming Appts:   na  . Labs/Testing @ [facility]:   o na    . Email Sent To Patient:   o Na- Contact [phone] given to patient.    . Faxes Sent To:   o na    . Epic Orders Entered:   o na    . Other Outlying information gathered that does not fit anywhere else  o Meningioma partial resection, Ascending aortic 4cm, cardiac LOV in chart    . Chart Room Handoff for Further  Follow-up if Applicable:  COVID ordered by surgeon. Ptient aare 72-96 hrs.    Ambulatory Screening Tool: No new symptoms of fever or respiratory symptoms. Patient has not traveled int the last month. Patient has not been in close contact with a confirmed COVID 19 patient. Patient does not reside in a nursing home or care facility.    Visitor Restriction guidelines discussed with patient. Patient made aware only one person may accompany to the hospital. One family or friend will be allowed to wait in lobby or common areas maintaining social distancing.

## 2019-03-21 ENCOUNTER — Other Ambulatory Visit: Payer: Self-pay | Admitting: Internal Medicine

## 2019-03-21 DIAGNOSIS — E2749 Other adrenocortical insufficiency: Secondary | ICD-10-CM

## 2019-03-21 MED ORDER — HYDROCORTISONE 5 MG PO TABS
10.0000 mg | ORAL_TABLET | Freq: Every morning | ORAL | 0 refills | Status: DC
Start: 2019-03-21 — End: 2019-05-06

## 2019-03-25 ENCOUNTER — Other Ambulatory Visit: Payer: Self-pay | Admitting: Internal Medicine

## 2019-03-25 ENCOUNTER — Ambulatory Visit (INDEPENDENT_AMBULATORY_CARE_PROVIDER_SITE_OTHER): Payer: Self-pay | Admitting: Cardiovascular Disease

## 2019-03-25 MED ORDER — SIMVASTATIN 20 MG PO TABS
20.0000 mg | ORAL_TABLET | Freq: Every evening | ORAL | 1 refills | Status: DC
Start: 2019-03-25 — End: 2019-08-05

## 2019-03-25 NOTE — Telephone Encounter (Signed)
Fax received from [facility] [address] [place], for Simvastatin 20 mg tab PO every HS.

## 2019-04-02 ENCOUNTER — Other Ambulatory Visit: Payer: Self-pay

## 2019-04-02 DIAGNOSIS — R350 Frequency of micturition: Secondary | ICD-10-CM

## 2019-04-02 DIAGNOSIS — N401 Enlarged prostate with lower urinary tract symptoms: Secondary | ICD-10-CM

## 2019-04-02 MED ORDER — AMLODIPINE BESYLATE 5 MG PO TABS
5.0000 mg | ORAL_TABLET | Freq: Two times a day (BID) | ORAL | 1 refills | Status: DC
Start: 2019-04-02 — End: 2019-09-16

## 2019-04-02 MED ORDER — FINASTERIDE 5 MG PO TABS
5.0000 mg | ORAL_TABLET | Freq: Every evening | ORAL | 3 refills | Status: DC
Start: 2019-04-02 — End: 2020-02-03

## 2019-04-12 ENCOUNTER — Ambulatory Visit (FREE_STANDING_LABORATORY_FACILITY): Payer: Medicare Other

## 2019-04-12 DIAGNOSIS — Z01818 Encounter for other preprocedural examination: Secondary | ICD-10-CM

## 2019-04-12 DIAGNOSIS — Z1159 Encounter for screening for other viral diseases: Secondary | ICD-10-CM

## 2019-04-14 LAB — COVID-19 (SARS-COV-2): SARS CoV 2 Overall Result: NOT DETECTED

## 2019-04-15 NOTE — Pre-Procedure Instructions (Signed)
Day Before Surgery Confirmation Call:     Spoke to: wife    Confirmed surgery date, arrival time, and location.   Arrival: 0700  Surgery: 0800    Patient denies recent visit to ED, hospitalization or PCP for acute illness since PSS RN Interview.     Ambulatory Screening Tool:   Patient denies themselves or anyone in immediate family currently experiencing fever or any symptoms of acute respiratory illness (cough, or shortness of breath).  Patient denies themselves or an immediate family member has traveled outside of the [place] in the last 14 days.   Patient has not been in close contact with a confirmed COVID19 patient.   Patient does not reside in a nursing home or long-term care facility.     Patient instructed that if you or your family does develop new symptoms, to contact surgeons office immediately, prior to arrival at the hospital.     Visitor Restriction Guidelines:   One family member may accompany the patient on day of surgery-All persons will be screened at hospital main entrance upon arrival, which will include a temperature reading and donning a face mask.   Family members and patients must wear masks and use social distancing while in waiting areas and cafeteria area.   Once patient goes to OR, family members are encouraged to go home for inpatients or stay in car for outpatients. Physicians will call contact person to give report of procedure.   Inpatient visitation will be honored during inpatient visiting hours- 1 consistent family member to visit daily from 1300-1700. No family members will be allowed into Phase 1 recovery areas.   For outpatients, family will be called into Phase 2 and will participate in discharge instructions.     Patient reminded of NPO instructions: NPO 4 hours-clears 8/24 and up until 4 hrs prior to procedure    Patient verbalized understanding and acceptance of above information.

## 2019-04-15 NOTE — Anesthesia Preprocedure Evaluation (Addendum)
Anesthesia Evaluation    AIRWAY    Mallampati: II    TM distance: >3 FB  Neck ROM: full  Mouth Opening:full  Planned to use difficult airway equipment: No CARDIOVASCULAR    cardiovascular exam normal, regular and normal       DENTAL         PULMONARY    pulmonary exam normal     OTHER FINDINGS                  Relevant Problems   ANESTHESIA   (+) Mild OSA       PULMONARY   (+) Mild OSA       NEURO/PSYCH   (+) History of asthma   (+) History of insomnia   (+) History of meningioma of the brain      CARDIO   (+) Essential hypertension   (+) Mild aortic regurgitation      ENDO   (+) Primary hypothyroidism       PSS Anesthesia Comments: GERD, OSA, Fibromyalgia,Chr fatigue syndrome,  , HTN, Color blindness, , hypothyroid Mitral insufficiency, Ascending aorta aneurysm , Mild aortic insuff, , asthma, Hx of meningeoma of brain, Marijuana use , BMI 25.84, STOP BANG=Sec adrenal insuff. , STOP BANG=5        Anesthesia Plan    ASA 3     general                     intravenous induction   Detailed anesthesia plan: general IV  Monitors/Adjuncts: other    Post Op: other  Trial extubation is not planned.  Post op pain management: per surgeon    informed consent obtained    Plan discussed with CRNA.    ECG reviewed               Signed by: Burman Riis 04/15/19 12:55 PM

## 2019-04-16 ENCOUNTER — Ambulatory Visit: Payer: Medicare Other | Admitting: Anesthesiology

## 2019-04-16 ENCOUNTER — Encounter
Admission: RE | Disposition: A | Discharge status: Home / Self Care | Payer: Self-pay | Source: Ambulatory Visit | Attending: Gastroenterology

## 2019-04-16 ENCOUNTER — Ambulatory Visit
Admission: RE | Admit: 2019-04-16 | Discharge: 2019-04-16 | Disposition: A | Discharge status: Home / Self Care | Payer: Medicare Other | Source: Ambulatory Visit | Attending: Gastroenterology | Admitting: Gastroenterology

## 2019-04-16 ENCOUNTER — Ambulatory Visit: Payer: Self-pay

## 2019-04-16 DIAGNOSIS — G9332 Myalgic encephalomyelitis/chronic fatigue syndrome: Secondary | ICD-10-CM

## 2019-04-16 DIAGNOSIS — R112 Nausea with vomiting, unspecified: Secondary | ICD-10-CM

## 2019-04-16 DIAGNOSIS — N400 Enlarged prostate without lower urinary tract symptoms: Secondary | ICD-10-CM | POA: Insufficient documentation

## 2019-04-16 DIAGNOSIS — R194 Change in bowel habit: Secondary | ICD-10-CM

## 2019-04-16 DIAGNOSIS — R1032 Left lower quadrant pain: Secondary | ICD-10-CM

## 2019-04-16 DIAGNOSIS — R5382 Chronic fatigue, unspecified: Secondary | ICD-10-CM

## 2019-04-16 DIAGNOSIS — K449 Diaphragmatic hernia without obstruction or gangrene: Secondary | ICD-10-CM | POA: Insufficient documentation

## 2019-04-16 DIAGNOSIS — K5641 Fecal impaction: Secondary | ICD-10-CM | POA: Insufficient documentation

## 2019-04-16 DIAGNOSIS — B3781 Candidal esophagitis: Secondary | ICD-10-CM | POA: Insufficient documentation

## 2019-04-16 DIAGNOSIS — R109 Unspecified abdominal pain: Secondary | ICD-10-CM | POA: Insufficient documentation

## 2019-04-16 DIAGNOSIS — K219 Gastro-esophageal reflux disease without esophagitis: Secondary | ICD-10-CM | POA: Insufficient documentation

## 2019-04-16 DIAGNOSIS — K5730 Diverticulosis of large intestine without perforation or abscess without bleeding: Secondary | ICD-10-CM | POA: Insufficient documentation

## 2019-04-16 DIAGNOSIS — I712 Thoracic aortic aneurysm, without rupture: Secondary | ICD-10-CM | POA: Insufficient documentation

## 2019-04-16 DIAGNOSIS — E039 Hypothyroidism, unspecified: Secondary | ICD-10-CM | POA: Insufficient documentation

## 2019-04-16 DIAGNOSIS — K259 Gastric ulcer, unspecified as acute or chronic, without hemorrhage or perforation: Secondary | ICD-10-CM | POA: Insufficient documentation

## 2019-04-16 DIAGNOSIS — I10 Essential (primary) hypertension: Secondary | ICD-10-CM | POA: Insufficient documentation

## 2019-04-16 DIAGNOSIS — G4730 Sleep apnea, unspecified: Secondary | ICD-10-CM | POA: Insufficient documentation

## 2019-04-16 DIAGNOSIS — K648 Other hemorrhoids: Secondary | ICD-10-CM | POA: Insufficient documentation

## 2019-04-16 HISTORY — DX: Unspecified malignant neoplasm of skin, unspecified: C44.90

## 2019-04-16 HISTORY — DX: Nonrheumatic mitral (valve) insufficiency: I34.0

## 2019-04-16 HISTORY — PX: EGD, COLONOSCOPY: SHX3799

## 2019-04-16 HISTORY — DX: Calculus of kidney: N20.0

## 2019-04-16 HISTORY — DX: Unspecified cataract: H26.9

## 2019-04-16 HISTORY — DX: Fibromyalgia: M79.7

## 2019-04-16 HISTORY — DX: Gastro-esophageal reflux disease without esophagitis: K21.9

## 2019-04-16 HISTORY — DX: Sleep apnea, unspecified: G47.30

## 2019-04-16 HISTORY — DX: Disorder of prostate, unspecified: N42.9

## 2019-04-16 SURGERY — EGD, COLONOSCOPY
Anesthesia: Anesthesia General | Site: Abdomen

## 2019-04-16 MED ORDER — LACTATED RINGERS IV SOLN
INTRAVENOUS | Status: DC
Start: 2019-04-16 — End: 2019-04-16

## 2019-04-16 MED ORDER — GLYCOPYRROLATE 0.2 MG/ML IJ SOLN (WRAP)
INTRAMUSCULAR | Status: DC | PRN
Start: 2019-04-16 — End: 2019-04-16
  Administered 2019-04-16: 0.1 mg via INTRAVENOUS

## 2019-04-16 MED ORDER — SODIUM CHLORIDE 0.9 % IV SOLN
INTRAVENOUS | Status: DC
Start: 2019-04-16 — End: 2019-04-16

## 2019-04-16 MED ORDER — PROPOFOL 10 MG/ML IV EMUL (WRAP)
INTRAVENOUS | Status: DC | PRN
Start: 2019-04-16 — End: 2019-04-16
  Administered 2019-04-16 (×2): 20 mg via INTRAVENOUS
  Administered 2019-04-16: 100 mg via INTRAVENOUS
  Administered 2019-04-16 (×3): 20 mg via INTRAVENOUS

## 2019-04-16 MED ORDER — PROPOFOL 10 MG/ML IV EMUL (WRAP)
INTRAVENOUS | Status: AC
Start: 2019-04-16 — End: ?
  Filled 2019-04-16: qty 40

## 2019-04-16 MED ORDER — FENTANYL CITRATE (PF) 50 MCG/ML IJ SOLN (WRAP)
INTRAMUSCULAR | Status: DC | PRN
Start: 2019-04-16 — End: 2019-04-16
  Administered 2019-04-16: 50 ug via INTRAVENOUS
  Administered 2019-04-16: 25 ug via INTRAVENOUS

## 2019-04-16 MED ORDER — FENTANYL CITRATE (PF) 50 MCG/ML IJ SOLN (WRAP)
INTRAMUSCULAR | Status: AC
Start: 2019-04-16 — End: ?
  Filled 2019-04-16: qty 2

## 2019-04-16 MED ORDER — LIDOCAINE HCL 2 % IJ SOLN
INTRAMUSCULAR | Status: AC
Start: 2019-04-16 — End: ?
  Filled 2019-04-16: qty 20

## 2019-04-16 MED ORDER — LIDOCAINE HCL 2 % IJ SOLN
INTRAMUSCULAR | Status: DC | PRN
Start: 2019-04-16 — End: 2019-04-16
  Administered 2019-04-16: 60 mg via INTRAVENOUS

## 2019-04-16 SURGICAL SUPPLY — 41 items
BITE BLOCK MAXI 60F LATEX FREE (Procedure Accessories) ×1
BLOCK BITE OD60 FR STURDY STRAP SIDEPORT (Procedure Accessories) ×1
BLOCK BITE OD60 FR STURDY STRAP SIDEPORT DENTAL RETENTION RIM MAXI (Procedure Accessories) ×1 IMPLANT
ELECTRODE ADULT PATIENT RETURN L9 FT REM POLYHESIVE ACRYLIC FOAM (Procedure Accessories) IMPLANT
ELECTRODE PATIENT RETURN L9 FT VALLEYLAB (Procedure Accessories)
FORCEPS BIOPSY L240 CM JUMBO MICROMESH (Instrument)
FORCEPS BIOPSY L240 CM JUMBO MICROMESH TEETH STREAMLINE CATHETER (Instrument) IMPLANT
FORCEPS BIOPSY L240 CM LARGE CAPACITY (Instrument) ×1
FORCEPS BIOPSY L240 CM MICROMESH TEETH STREAMLINE CATHETER NEEDLE (Instrument) IMPLANT
FORCEPS JAW RADIAL JUMBO (Instrument)
FORCEPS RAD JAW 4 BIOSPY W/NDL (Instrument) ×1
GAUZE SPONGE 4X4 NS (Dressing) ×1
GLOVE EXAM LARGE NITRILE CHEMOTHERAPY POWDER FREE SENSE OATMEAL (Glove) ×1 IMPLANT
GLOVE EXAM LARGE NITRILE POWDER FREE SENSE OATMEAL (Glove) ×1 IMPLANT
GLOVE EXAM NITRILE RESTORE LG (Glove) ×1
GLV EXAM NITRILE RESTORE LG (Glove) ×2
GOWN CP ELSTC WRIST REG/LG BL (Gown) ×2
GOWN ISL PP PE REG LG LF FULL BCK NK TIE (Gown) ×2
GOWN ISOLATION REGULAR LARGE FULL BACK NECK TIE ELASTIC CUFF (Gown) ×2 IMPLANT
MASK FACE FM FLDSHLD LF LVL 3 TIE EYSHLD (Personal Protection) ×2 IMPLANT
MASK FLUIDSHIELD NON-FOG SURG (Personal Protection) ×2
NEEDLE CARR-LOCKE INJECT 25GX5 (Needles)
NEEDLE SCLEROTHERAPY CARR-LOCKE OD25 GA ODSEC2.5 MM L230 CM INJECTION (Needles) IMPLANT
NEEDLE SCLEROTHERAPY OD25 GA ODSEC2.5 MM (Needles)
PAD ELECTROSRG GRND REM W CRD (Procedure Accessories)
SNARE CAPTIVATOR 13MMX240CM (GE Lab Supplies)
SNARE ESCP MIC CPTVTR 13MM 240IN STRL (GE Lab Supplies)
SNARE SMALL HEXAGON CAPTIVATOR STIFF ENDOSCOPIC POLYPECTOMY (GE Lab Supplies) IMPLANT
SPONGE GAUZE L4 IN X W4 IN 16 PLY (Dressing) ×1
SPONGE GAUZE L4 IN X W4 IN 16 PLY MAXIMUM ABSORBENT USP TYPE VII (Dressing) ×1 IMPLANT
SYRINGE 50 ML GRADUATE NONPYROGENIC DEHP (Syringes, Needles)
SYRINGE 50 ML GRADUATE NONPYROGENIC DEHP FREE PVC FREE BD MEDICAL (Syringes, Needles) ×1 IMPLANT
SYRINGE SLIP-TIP 60CC (Syringes, Needles)
TRAP MUCUS SCREW CAP TUBE ID LABEL (Procedure Accessories)
TRAP MUCUS SCREW CAP TUBE ID LABEL MEDLINE PLASTIC CLEAR (Procedure Accessories) IMPLANT
TRAP MUCUS SPEC 40CC (Procedure Accessories)
TRAP SPECIMEN ETRAP POLYP 15CM (Procedure Accessories)
TRAP SPECIMEN REMOVAL L15 CM MAGNIFY (Procedure Accessories)
TRAP SPECIMEN REMOVAL L15 CM MAGNIFY WINDOW MEASUREMENT GUIDE ETRAP (Procedure Accessories) IMPLANT
WATER STERILE PLASTIC POUR BOTTLE 250 ML (Irrigation Solutions) ×1 IMPLANT
WATER STRL IRRIG 250ML BTL (Irrigation Solutions) ×1

## 2019-04-16 NOTE — Anesthesia Postprocedure Evaluation (Signed)
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, MD    Procedure(s) with comments:  EGD, COLONOSCOPY - egd, colonoscopy  q1-unk, md req 45 min    Anesthesia type: general    Last Vitals:   Vitals Value Taken Time   BP 158/70 04/16/2019  8:45 AM   Temp  04/16/2019  8:52 AM   Pulse 50 04/16/2019  8:45 AM   Resp 16 04/16/2019  8:45 AM   SpO2 96 % 04/16/2019  8:45 AM                 Anesthesia Post Evaluation:     Patient Evaluated: PACU  Patient Participation: complete - patient participated  Level of Consciousness: awake  Pain Score: 0  Pain Management: adequate    Airway Patency: fixed obstruction    Anesthetic complications: No      PONV Status: none    Cardiovascular status: stable  Respiratory status: room air  Hydration status: stable        Signed by: Juanell Fairly, 04/16/2019 8:52 AM

## 2019-04-16 NOTE — Discharge Instr - AVS First Page (Signed)
Instructions for after your discharge:  Endoscopy Discharge Instructions  General Instructions:  1. Following sedation, your judgement, perception, and coordination are considered impaired. Even though you may feel awake and alert, you are considered legally intoxicated. Therefore, until the next morning;   Do not Drive   Do not operate appliances or equipment that requires reaction time (e.g. stove, electrical tools, machinery)   Do not sign legal documents or be involved in important decisions.   Do not smoke if alone   Do not drink alcoholic beverages   Go directly home and rest for several hours before resuming your routine activities.   It is highly recommended to have a responsible adult stay with you for the next 24 hours    2. Tenderness, swelling or pain may occur at the IV site where you received sedation. If you experience this, apply warm soaks to the area. Notify your physician if this persists.    Instructions Specific To Procedures - Report To Physician Any Of The Following:    Upper Endoscopy, ERCP, Dilations   1. Pain in Chest   2. Nausea/vomitting   3. Fevers/Chills within 24 hours after procedure. Temp>101deg F   4. Severe and persistent abdominal pain and bloating     In Addition:   Mild throat soreness may follow this procedure. Warm salt water gargling or    lozenges of your choice will most likely relieve your discomfort or cold drinks and   popsicles.     Colon/Sigmoidoscopy/Proctoscopy   1. Severe and persistent abdominal pain/bloating which does not subside within   2-3 hours   2. Large amount of rectal bleeding (some mucosal blood streaking may occur,   especially if biopsy or polypectomy was done or if hemorrhoids are present.   3. Nausea/vomitting   4. Fevers/Chills within 24 hours after procedure. Temp>101deg F     In Addition:   If polyp has been removed, DO NOT take aspirin or aspirin containing products   (e.g. Anacin, Alka Seltzer, Bufferin, Etc.) or non-steroidal  anti-inflammatory drugs   (e.g. Advil, Motrin, etc.) for (per MD) days unless otherwise advised by doctor. Tylenol   or extra Strength Tylenol is permitted.      Additional Discharge Instructions  Your diet after the procedure: First meal should be small, lite, and bland; nothing spicy, fried, or greasy.  Special Instructions: Follow MD instructions on procedural report.  No foods or drinks containing red dye for next 24 hours.      If you have questions or problems contact your MD immediately. If you need immediate attention, call your MD, 911 and/or go to nearest emergency room.

## 2019-04-16 NOTE — Transfer of Care (Signed)
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, MD    Procedures performed: Procedure(s) with comments:  EGD, COLONOSCOPY - egd, colonoscopy  q1-unk, md req 45 min    Anesthesia type: General TIVA    Patient location:Phase II PACU    Last vitals:   Vitals:    04/16/19 0753   BP: 175/74   Resp: 16   SpO2: 100%       Post pain: Patient not complaining of pain, continue current therapy      Mental Status:awake and alert     Respiratory Function: tolerating room air    Cardiovascular: stable    Nausea/Vomiting: patient not complaining of nausea or vomiting    Hydration Status: adequate    Post assessment: no apparent anesthetic complications, no reportable events and no evidence of recall    Signed by: Ervin Knack  04/16/19 8:44 AM

## 2019-04-16 NOTE — H&P (Signed)
GI PRE PROCEDURE NOTE    Proceduralist Comments:   Review of Systems and Past Medical / Surgical History performed: Yes     Indications:Abdominal pain, Nausea and Vomiting and Change in bowel habits and fecal impaction    Previous Adverse Reaction to Anesthesia or Sedation (if yes, describe): No    Physical Exam / Laboratory Data (If applicable)   General: Alert and cooperative  Lungs: Lungs clear to auscultation  Cardiac: RRR, normal S1S2.    Abdomen: Soft, non tender. Normal active bowel sounds  Other:     Outside labs reviewed    American Society of Anesthesiologists (ASA) Physical Status Classification:   Anesthesia ASA Score: 3          Planned Sedation:   Deep sedation with anesthesia    Attestation:   Jamison Neighbor, MD has been reassessed immediately prior to the procedure and is an appropriate candidate for the planned sedation and procedure. Risks, benefits and alternatives to the planned procedure and sedation have been explained to the patient or guardian:  yes        Signed by: Leatha Gilding

## 2019-04-17 ENCOUNTER — Encounter: Payer: Self-pay | Admitting: Gastroenterology

## 2019-04-17 LAB — LAB USE ONLY - HISTORICAL SURGICAL PATHOLOGY

## 2019-04-30 ENCOUNTER — Encounter: Payer: Self-pay | Admitting: Internal Medicine

## 2019-05-06 ENCOUNTER — Other Ambulatory Visit: Payer: Self-pay

## 2019-05-06 DIAGNOSIS — E2749 Other adrenocortical insufficiency: Secondary | ICD-10-CM

## 2019-05-06 MED ORDER — HYDROCORTISONE 5 MG PO TABS
10.0000 mg | ORAL_TABLET | Freq: Every morning | ORAL | 0 refills | Status: DC
Start: 2019-05-06 — End: 2019-08-09

## 2019-05-08 ENCOUNTER — Encounter: Payer: Self-pay | Admitting: Internal Medicine

## 2019-05-31 ENCOUNTER — Other Ambulatory Visit: Payer: Self-pay

## 2019-05-31 DIAGNOSIS — I10 Essential (primary) hypertension: Secondary | ICD-10-CM

## 2019-05-31 MED ORDER — CARVEDILOL 6.25 MG PO TABS
ORAL_TABLET | ORAL | 1 refills | Status: DC
Start: 2019-05-31 — End: 2019-12-18

## 2019-06-10 ENCOUNTER — Encounter (INDEPENDENT_AMBULATORY_CARE_PROVIDER_SITE_OTHER): Payer: Self-pay | Admitting: Internal Medicine

## 2019-06-26 ENCOUNTER — Other Ambulatory Visit: Payer: Self-pay | Admitting: Internal Medicine

## 2019-06-26 DIAGNOSIS — E039 Hypothyroidism, unspecified: Secondary | ICD-10-CM

## 2019-08-02 ENCOUNTER — Encounter: Payer: Self-pay | Admitting: Internal Medicine

## 2019-08-05 ENCOUNTER — Other Ambulatory Visit: Payer: Self-pay

## 2019-08-05 DIAGNOSIS — I10 Essential (primary) hypertension: Secondary | ICD-10-CM

## 2019-08-05 MED ORDER — RAMIPRIL 10 MG PO CAPS
20.00 mg | ORAL_CAPSULE | Freq: Every evening | ORAL | 0 refills | Status: DC
Start: 2019-08-05 — End: 2019-10-21

## 2019-08-05 MED ORDER — SIMVASTATIN 20 MG PO TABS
20.0000 mg | ORAL_TABLET | Freq: Every evening | ORAL | 1 refills | Status: DC
Start: 2019-08-05 — End: 2020-02-17

## 2019-08-08 ENCOUNTER — Encounter: Payer: Self-pay | Admitting: Internal Medicine

## 2019-08-08 ENCOUNTER — Telehealth: Payer: Self-pay | Admitting: Internal Medicine

## 2019-08-08 DIAGNOSIS — E2749 Other adrenocortical insufficiency: Secondary | ICD-10-CM

## 2019-08-08 NOTE — Telephone Encounter (Signed)
Pt called offices yesterday evening and LM regarding an error in his medication - pt stated the following med script was written incorrectly.       Pt is requesting 15mg  pen and would like it sent into the below pharm;   [id number] - [place], [place] - [phone]  [phone]    Pt did not provide call back #.

## 2019-08-08 NOTE — Telephone Encounter (Signed)
Received fax from pharmacy - they wrote note stating "Patient taking 15mg  2-3 times a day, please send TWO RXs, one for 10 mg and another for 5mg .    FAX [phone] ([id number])

## 2019-08-09 ENCOUNTER — Other Ambulatory Visit: Payer: Self-pay | Admitting: Internal Medicine

## 2019-08-09 ENCOUNTER — Encounter: Payer: Self-pay | Admitting: Internal Medicine

## 2019-08-09 DIAGNOSIS — E2749 Other adrenocortical insufficiency: Secondary | ICD-10-CM

## 2019-08-09 DIAGNOSIS — Z86011 Personal history of benign neoplasm of the brain: Secondary | ICD-10-CM

## 2019-08-09 MED ORDER — HYDROCORTISONE 10 MG PO TABS
20.0000 mg | ORAL_TABLET | Freq: Every morning | ORAL | 0 refills | Status: DC
Start: 2019-08-09 — End: 2019-10-30

## 2019-08-09 NOTE — Progress Notes (Signed)
(  also sent via MyChart but does not appear you viewed the message)     Dear Shannon [name]:     I got a message from the pharmacy in re to refilling your hydrocortisone     "Received fax from pharmacy - they wrote note stating "Patient taking 15mg  2-3 times a day, please send TWO RXs, one for 10 mg and another for 5mg . FAX [phone] ([id number])"     Can you clarify how much you are normally taking daily normally?     I was under the impression you were taking 10 mg initially and then another 15 mg only daily     Please contact me if you have any additional concerns or questions.      Shannon Dell, MD  [facility]  [address]  [place], [place] [address]  [place] [phone]   F [phone]  [email or web address]

## 2019-08-09 NOTE — Progress Notes (Signed)
I am glad Dr. Jodie Echevaria is on your team and think he has been a great asset. I see he ordered studies of fecal fat, pancreatic elastase, stool calprotectin, ova and parasites/giardia, stool culture and c dif and wanted a home test for small intestinal bacterial overgrowth (though he doubted presence of infection causing symptoms). Your last brain MRI was in Jan 2020 and was read by [facility] as stable - is that right?     I refilled your hydrocortisone 10 mg with sig T1-2 PO QAM #180 for 3 mo supply to your [facility] today. If you need burst dosage at any point let me know and I can put in for this (though from reading some of the notes of your endocrine specialists Dr. Fayrene Helper and Dr. Tamera Punt they wondered about the use of the steroids to treat these episodes). Clearly there are risks chronically to taking high doses of the steroids and I know you are aware of these and your specialists have discussed this previously.     If we are doing blood work on the thyroid may be best to do this as part of an annual exam with me so we can be comprehensive and check thyroid, cholesterol, renal and hepatic function and blood sugar etc.  If you wanted to schedule this with me please call my office below to arrange a convenient time for you.     I wish you a safe and happy holiday season    Gloriajean Dell, MD  [facility]  [address], [address]  [place], [place] [address]  [place] [phone]   F [phone]  [email or web address]      Dear Dr. Orlin Hilding     I take 15 mg to 20mg  Hydrocortisone Q AM.     When sick or under increased stress, additional medication is taken as recommended by [facility].  Additional dosage also depends on the situation that can vary from mildly ill to what is probably adrenal insufficiency.  It is difficult to always recognize what is going on as every day is different.    So, perhaps one script for Hydrocortisone 10 mg tabs, 3 mo supply, Sig: one or two tabs Q AM and another script to treat the symptoms of  adrenal insufficiency and avoid a trip to the ER or another hospitalization.  [name] has previously prescribed 80 mg Q 8H for three days with additional dosages to wean down.      Dr. Jodie Echevaria, my GI doc wants me to touch base with you since I have been experiencing chronic intermittent nausea and vomiting that has required ER visits and on one occasion requiring admission (elevated lactic acid, CBC and perirectal stranding).   I had endoscopy and colonoscopy.  Dr. Jodie Echevaria has prescribed Phenergan, Compazine and Zofran.  Zofran seems to work the best but is extremely constipating.    Dr. Jodie Echevaria thought a brain MRI should be done but Dr. Nilda Calamity (neurologist) disagreed.    Dr. Jodie Echevaria asked that I do a home breath test, obtain a stool culture/screen and obtain blood work with you (TFT's, BMP, CBC and anything else that you think might shed more light on my condition).     Your recommendations are always welcome.    Thanks,    Marcas Cahall

## 2019-08-12 ENCOUNTER — Encounter: Payer: Self-pay | Admitting: Internal Medicine

## 2019-08-12 NOTE — Progress Notes (Signed)
Thank you for responding so quickly.  I am currently with family this weekend but will address concerns and questions on Monday.  My daughter and her husband have also been in quarantine since this nightmare began.  Hopefully all of [place] will get through this until the vaccine is available.  My wife is 81 so with a little luck, as senior citizens, we will qualify in the near future for the vaccine.   Thanks and have a nice weekend.

## 2019-09-03 ENCOUNTER — Other Ambulatory Visit: Payer: Self-pay | Admitting: Internal Medicine

## 2019-09-03 DIAGNOSIS — E039 Hypothyroidism, unspecified: Secondary | ICD-10-CM

## 2019-09-05 ENCOUNTER — Encounter: Payer: Self-pay | Admitting: Internal Medicine

## 2019-09-06 ENCOUNTER — Ambulatory Visit (INDEPENDENT_AMBULATORY_CARE_PROVIDER_SITE_OTHER): Payer: Medicare Other

## 2019-09-06 DIAGNOSIS — Z23 Encounter for immunization: Secondary | ICD-10-CM

## 2019-09-06 NOTE — Progress Notes (Signed)
COVID-19 Vaccination:  Shannon [name]   Nov 07, 1950  09/06/2019     Patient presented to the office for the following COVID-19 vaccine administration:    [x]  Moderna   []  Pfizer-BioNTech       [x]  Dose #1   []  Dose #2   (Both doses of the series should be completed with the same product.)    [x]  A verbal consent has been obtained from patient to administer COVID-19 vaccine that is currently being offered under Emergency Use Authorization (EUA). Patient/Caregiver has been provided a copy of EUA.      [x]  Pt has not received other non-COVID vaccinations within 14 days of administration of COVID-19 vaccination.  [x]  Pt was screened for precautions and contraindications to vaccine  []  24-28+ day interval observed between first and second dose (Moderna)  []  17-21+ day interval observed between first and second dose (Pfizer-BioNTech)    Pt received intramuscular injection in the [x]  Right  []  Left deltoid.      Pt was observed post-vaccination for the occurrence of immediate adverse reactions:   [x]  15 minutes: No prior history of significant allergic reaction to vaccine or injectable therapy or history of anaphylaxis due to any cause   []  30 minutes: Prior history of an immediate allergic reaction of any severity to a vaccine or injectable therapy (urticaria, angioedema, respiratory distress (e.g., wheezing, stridor))  []  30 minutes: Prior history of anaphylaxis due to any cause     Post-Vaccination Observation:  [x]  No reaction was noted and patient left in good condition.  []  Arm Soreness  []  Erythema  []  Hives  []  Other:    2nd Dose Instructions:  [x]  Pt instructed to schedule 2nd dose at front desk before leaving facility

## 2019-09-16 ENCOUNTER — Other Ambulatory Visit: Payer: Self-pay

## 2019-09-16 DIAGNOSIS — K117 Disturbances of salivary secretion: Secondary | ICD-10-CM

## 2019-09-16 MED ORDER — PILOCARPINE HCL 5 MG PO TABS
5.0000 mg | ORAL_TABLET | Freq: Three times a day (TID) | ORAL | 1 refills | Status: DC
Start: 2019-09-16 — End: 2019-12-27

## 2019-09-16 MED ORDER — AMLODIPINE BESYLATE 5 MG PO TABS
5.0000 mg | ORAL_TABLET | Freq: Two times a day (BID) | ORAL | 1 refills | Status: DC
Start: 2019-09-16 — End: 2019-12-23

## 2019-09-24 ENCOUNTER — Encounter: Payer: Self-pay | Admitting: Internal Medicine

## 2019-10-04 ENCOUNTER — Ambulatory Visit (INDEPENDENT_AMBULATORY_CARE_PROVIDER_SITE_OTHER): Payer: Medicare Other

## 2019-10-04 DIAGNOSIS — Z23 Encounter for immunization: Secondary | ICD-10-CM

## 2019-10-19 ENCOUNTER — Other Ambulatory Visit: Payer: Self-pay | Admitting: Internal Medicine

## 2019-10-19 DIAGNOSIS — E039 Hypothyroidism, unspecified: Secondary | ICD-10-CM

## 2019-10-21 ENCOUNTER — Other Ambulatory Visit: Payer: Self-pay

## 2019-10-21 DIAGNOSIS — I10 Essential (primary) hypertension: Secondary | ICD-10-CM

## 2019-10-21 MED ORDER — RAMIPRIL 10 MG PO CAPS
20.00 mg | ORAL_CAPSULE | Freq: Every evening | ORAL | 0 refills | Status: DC
Start: 2019-10-21 — End: 2019-12-24

## 2019-10-30 ENCOUNTER — Other Ambulatory Visit: Payer: Self-pay

## 2019-10-30 DIAGNOSIS — E2749 Other adrenocortical insufficiency: Secondary | ICD-10-CM

## 2019-10-30 MED ORDER — HYDROCORTISONE 10 MG PO TABS
20.0000 mg | ORAL_TABLET | Freq: Every morning | ORAL | 0 refills | Status: DC
Start: 2019-10-30 — End: 2019-11-27

## 2019-11-05 ENCOUNTER — Other Ambulatory Visit: Payer: Self-pay

## 2019-11-05 DIAGNOSIS — E782 Mixed hyperlipidemia: Secondary | ICD-10-CM

## 2019-11-05 MED ORDER — EZETIMIBE 10 MG PO TABS
10.0000 mg | ORAL_TABLET | Freq: Every day | ORAL | 3 refills | Status: DC
Start: 2019-11-05 — End: 2020-10-19

## 2019-11-13 ENCOUNTER — Other Ambulatory Visit: Payer: Self-pay | Admitting: Internal Medicine

## 2019-11-13 ENCOUNTER — Encounter: Payer: Self-pay | Admitting: Internal Medicine

## 2019-11-13 DIAGNOSIS — R3912 Poor urinary stream: Secondary | ICD-10-CM

## 2019-11-13 DIAGNOSIS — N401 Enlarged prostate with lower urinary tract symptoms: Secondary | ICD-10-CM

## 2019-11-13 MED ORDER — TAMSULOSIN HCL 0.4 MG PO CAPS
0.40 mg | ORAL_CAPSULE | Freq: Every day | ORAL | 0 refills | Status: DC
Start: 2019-11-13 — End: 2020-02-03

## 2019-11-18 ENCOUNTER — Encounter: Payer: Self-pay | Admitting: Internal Medicine

## 2019-11-26 ENCOUNTER — Encounter: Payer: Self-pay | Admitting: Internal Medicine

## 2019-11-27 ENCOUNTER — Ambulatory Visit (INDEPENDENT_AMBULATORY_CARE_PROVIDER_SITE_OTHER): Payer: Medicare Other | Admitting: Internal Medicine

## 2019-11-27 ENCOUNTER — Other Ambulatory Visit: Payer: Self-pay | Admitting: Internal Medicine

## 2019-11-27 VITALS — BP 164/57 | HR 51 | Temp 98.4°F | Wt 178.0 lb

## 2019-11-27 DIAGNOSIS — K581 Irritable bowel syndrome with constipation: Secondary | ICD-10-CM

## 2019-11-27 DIAGNOSIS — W540XXA Bitten by dog, initial encounter: Secondary | ICD-10-CM

## 2019-11-27 DIAGNOSIS — R7401 Elevation of levels of liver transaminase levels: Secondary | ICD-10-CM

## 2019-11-27 DIAGNOSIS — S60572A Other superficial bite of hand of left hand, initial encounter: Secondary | ICD-10-CM

## 2019-11-27 DIAGNOSIS — S40872A Other superficial bite of left upper arm, initial encounter: Secondary | ICD-10-CM

## 2019-11-27 DIAGNOSIS — Z20818 Contact with and (suspected) exposure to other bacterial communicable diseases: Secondary | ICD-10-CM

## 2019-11-27 DIAGNOSIS — R739 Hyperglycemia, unspecified: Secondary | ICD-10-CM

## 2019-11-27 DIAGNOSIS — E291 Testicular hypofunction: Secondary | ICD-10-CM

## 2019-11-27 DIAGNOSIS — R110 Nausea: Secondary | ICD-10-CM

## 2019-11-27 DIAGNOSIS — R350 Frequency of micturition: Secondary | ICD-10-CM

## 2019-11-27 DIAGNOSIS — I10 Essential (primary) hypertension: Secondary | ICD-10-CM

## 2019-11-27 DIAGNOSIS — R5383 Other fatigue: Secondary | ICD-10-CM

## 2019-11-27 DIAGNOSIS — Z7952 Long term (current) use of systemic steroids: Secondary | ICD-10-CM

## 2019-11-27 DIAGNOSIS — E039 Hypothyroidism, unspecified: Secondary | ICD-10-CM

## 2019-11-27 DIAGNOSIS — E2749 Other adrenocortical insufficiency: Secondary | ICD-10-CM

## 2019-11-27 MED ORDER — HYDROCORTISONE 20 MG PO TABS
ORAL_TABLET | ORAL | 0 refills | Status: DC
Start: 2019-11-27 — End: 2019-12-10

## 2019-11-27 MED ORDER — DOXYCYCLINE HYCLATE 100 MG PO TABS
100.00 mg | ORAL_TABLET | Freq: Two times a day (BID) | ORAL | 0 refills | Status: AC
Start: 2019-11-27 — End: 2019-12-02

## 2019-11-27 NOTE — Patient Instructions (Signed)
Take additional 20 mg today (total of 80 today)  Take 80 mg tomorrow and Friday  After that taper by 20 mg daily to get you back to 20 mg daily

## 2019-11-27 NOTE — Progress Notes (Signed)
Labs collected from right [facility], outermost vein on first attempt. Labs and urine sent to [facility]. Patient tolerated well.

## 2019-11-27 NOTE — Progress Notes (Signed)
Chief Complaint   Patient presents with   . Dog Bite     L distal arm - dog has lyme disease   . 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  I last saw the patient in person in 2019.  He has been followed by multiple specialists since that time for his chronic fatigue fibromyalgia adrenal insufficiency and other problems as documented below..     He notes he had a bite from a dog 2 days ago on his left upper extremity hand and upper arm.  His tetanus prophylaxis is up-to-date.  He notes the dog is being treated for Lyme disease with antibiotics.  The bite did break the skin but he denies any worsening pain redness swelling or pus or discharge in this area.  I spoken with him earlier today and recommended he start taking doxycycline for this.     He has had chronic issues with fatigue and myalgias and cyclical attacks of fatigue nausea and flulike symptoms in the past.  These have been generally resolved in the past with steroid treatment.  He notes another flare of flulike symptoms with myalgias without fever widespread pain in the absence of abdominal pain fatigue and some mild agitation.  He feels nauseated but has not had vomiting.  He has had 4 stools today but no blood in the stool.  He has no new obvious exposures or change in diet.  He took some Klonopin.  He self increased his dose of Hydrocort F from 20 mg daily at baseline to 60 mg today.  This has been helpful in the past.  Last time this occurred was a year ago.  His other specialist care is summarized below.    Active Problems  Patient Active Problem List    Diagnosis Date Noted   . Chronic fatigue fibromyalgia syndrome 01/24/2013     Priority: High     In Aug 2006 noted onset of severe fatigue and myalgias. Extensive eval for CT and autoimmune disease  at [facility] and [facility]. Ultimately discovered to have inadequate thyroid replacement and low free testosterone. He was eval by endocrine .Ultimately dx w fibromyalgia / chronic fatigue syndrome prescribed various meds including provigil, cymbalta, gabapentin and pregabalin, and elavil (all w/o improvement).     - 11/22/18: eval w Neuro Dr. Eilleen Kempf: has been taking melatonin and baclofen at around 10:00 PM.  He goes to bed btw 10:00 - 3:00 AM with highly variable sleep onset latency.  Patient reports less difficulty falling asleep when going to bed between 1-3 AM.  He gets out of bed next day between 11-12 feeling unrefreshed.  Patient housebound during the pandemic.  He struggles with chronic pain and severe daytime fatigue.  Patient reports benefit from using a heating blanket and taking steam shower at night.  He has a history of anxiety disorder and takes clonazepam approxy 1x week.  Patient reports no benefit from baclofen.  This med will be .  He will proceed with titration of amitriptyline up to 70 mg nightly as tolerated.  Continue melatonin.  Follow-up 6 weeks  - 10/03/18: email from Dr. Jon Billings [facility] for potential paraneoplastic antibodies that can be associated with peripheral neuropathy negative.  This is good news. I strongly suspect numbness in feet that you've developed is from prediabetes (A1C 6.3 and elevated for a few years ). To reduce this, can try to lose weight and exercising. Would be good for neuropathy and for preventing slide into diabetic range.  The only lab of concern is B6 over 80. I don't feel this is causing neuropathy at this level, but it is close to being issue. Elevated B6 has been clearly shown to cause peripheral neuropathy, primarily small fiber, in animal models and likely does in patients as well. Generally, I get really concerned if level is >100, but 80s is not far from that. If you are taking supplements with B6 (I.e, pyridoxine), I suggest you stop. I also want to  emphasize that though we chased this down, I don't believe this is cause (either prediabetes, elevated B6, or small fiber neuropathy) of majority your symptoms. You have chronic fatigue syndrome and I would continue to advise you to seek care with a specialty center for this unique and challenging disorder.   - 08/06/18: eval w Dr Bosie Clos [facility]): h/o fatigue and myalgias in 2006 after having viral prodrome continues to be c/w chronic fatigue.  Additionally, insomnia with difficulty falling sleep maintaining sleep.  He reports sleeps no more than 1-1.5 hrs at time.  This likely contribute sig to fatigue and somnolence.  Rec trial of amitriptyline in setting of fatigue.  Amitriptyline may also benefit chronic pain.  For chronic pain/myalgias he is taking tramadol, ( which we rec stopping ) buprenorphine and clonazepam.  We advised him to minimize exposure to opiates and BZD as these can make somnolent fatigued and be factor in causing cognitive issues.  Is not clear what caused his paraphasic errors at  [facility] 07/30/18, but suspect fatigue and meds played role.  Additionally, he reports 1 yr of numbness and tingling in feet.  Exam sig only for mildly decreased sensation to temp bilateral feet.  He reports intact sensation pinprick.  He also reports episodes of nausea dry heaving.  To eval for possible small fiber neuropathy causing peripheral numbness or tingling and possible gastric dysmotility suggest skin biopsy which he elected to pursue.  If skin biopsy abnl, we will pursue further wu.  In addition to episodes of nausea and dry heaving.  He also reports having multiple daily episodes of loose stools and abd cramping.  Suspect there is component of IBS, but rec further eval w GI.  Plan.  For insomnia in setting of chronic fatigue -amitriptyline 10 mg at night for 1 week, then every week, increase by 10 mg to a dose of 40 mg.  Rec stopping tramadol skin biopsy for WU of small fiber  neuropathy.  Consider eval at [facility] for chronic fatigue.  GI referral for eval of loose stools and cramping.  Continue w neurosurgery or [facility] for meningioma.  - 09/08/17: eval with Rheum ([facility]): A/P #1 Pituitary insufficiency  #2 Secondary adrenal insufficiency  #3 Myalgias  #4 Sicca symptoms: suspicion is that pt has an underlying rheum process that accounts for his steroid responsive symptoms here is exceedingly low. The constellation of symptoms he describes during a crisis is not typical for specific autoimmune disease. In particular, inflammatory myopathy would typically present with painless muscle weakness, and muscle strength is preserved, and he is endorsing more myalgia. I reviewed pictures he brought in with him for episodes in hands and feet, and I am at a  bit of a loss as to what this is. It does not look like CRPS, erythemalgia, or Raynaud's. I do note he has had extensive serologic testing here so far including ANA, ENAs, complements, all of which are within normal limits as are inflammatory markers. To complete serologic evaluation, I think we can check cryoglobulins, ANCAs, and a CCP, and given back pain, which seems myofascial than intra-articular or muscular, would rec just obtaining x-rays of spine as well as feet ankles. I will review the results with him on the portal.       . Irritable bowel syndrome with constipation / Cyclic vomiting syndrome 02/11/2019     Priority: Medium     - 08/01/19 eval w Dr. Jodie Echevaria: He does think that amitriptyline 50 mg helped.  He did use Zofran helped and was able to keep him out of hospital.  Resolved nausea but constipating.  He was try to lose weight but lost 25 pounds not all intentional.  He has 2 to 3-day cycles.  Will have constant dry heaves then resolve.  Describes what feels like post viral syndrome.  He is Compazine and Phenergan first Zofran last result.  He does note that when he has the nausea he has foul-smelling stool.  Rec'd  Zofran twice daily as needed continue amitriptyline arrange for home SIBO breath test. Continue Linzess.  - 06/18/19 eval w Dr. Jodie Echevaria: Rec trial of Motegrity at last outpt visit but not covered so changed to Linzess 145 mcg daily.  This seems to help him get regular.  Constipation controlled and feels fine but does have periodic acute bouts of nausea and vomiting ha no warning and then feels fine.  He will have bouts intermittently and spells where he cant get out of bed. Good and bad cycles every 3 to 4 days and this has been present for years.  If he can intervene with Compazine he can abort it.  He did discuss this with neurologist but they did not think necessary. -Rec continuing meds increase TCA to 50 mg nightly.  Phenergan 25 mg as needed.  Discussed imaging again with his other physicians follow-up in 6 wks  - 05/07/19 eval w Dr. Jodie Echevaria: Recent colo endoscopy.  He just came out of "bad spell.  Feels like worst flu.  He has this intermittently every few weeks for years.  Had this month since colonoscopy.  Compazine help with nausea.  Not related to eating.  Imp: Nausea and vomiting fatigue malaise fecal impaction GERD irritable bowel syndrome constipation.  Work-up unremarkable to date.  He does look better relative to when we first met.  He does have intermittent symptoms of nausea out of blue but not strictly early satiety.  Possibly trial of Motegrity.  But also rec intracranial imaging.  Rec Motegrity 2 mg daily for constipation.  He will speak w neurologist about intracranial imaging.  FU 6 weeks  - Colo / EGD 04/16/19: A few medium mouth diverticula in the sigmoid colon.  The entire colon appeared normal.  Biopsies were taken  EGD Dr Jodie Echevaria 04/16/19: Examined esophagus normal.  Biopsies taken.  Small hiatal hernia present.  Patchy mild inflammation found in gastric body in gastric antrum. PATH: Normal gastric antral mucosa negative for gastritis H. pylori herpes viral cytopathic changes intestinal metaplasia or  dysplasia.  Gastric body mucosa showing mild superficial erosive injury negative for inflammation Helicobacter herpes viral cytopathic changes intestinal metaplasia or dysplasia.  GE junction biopsy: Possible reflux GE mucosa showing chronic inflammation  and hyperplastic changes negative for intestinal metaplasia or dysplasia.  Mid esophagus biopsy: Esophageal candidiasis squamous mucosa showing superficial acute inflammation and intraepithelial fungal elements consistent with Candida species          . Mild aortic regurgitation 09/07/2018     Priority: Medium     - 09/07/18: eval w cardiology Dr. Harlow Asa: recommend echo, continue current antihypertensives, FU 1 year  -ECHO at [facility] 2018: described mild AR and mildly enlarged aortic root LV nl     . Elevated ALT measurement 02/14/2018     Priority: Medium     - 12/18/18: eval w GI Dr. Donell Sievert: He looks better than at last visit.  The nausea reflux are better with treatment.  He may have fibromyalgia and possibly adrenal insufficiency being managed by appropriate specialist.  He continues to struggle with fatigue.  An extensive work-up for LFTs unremarkable besides fatty liver disease.  He continues to feel better.  He only needs pantoprazole twice a week.  He has Compazine and Phenergan for bad episodes but has not needed it.  He remains on hydrocortisone.  The fatigue continues but manageable.  Trying to get back on Provigil but awaiting approval.  No pain or changes in bowel habits.  Cramps are better.  He added a yogurt shake and fruit and prunes.  Rec continue current GI medications follow-up in 6 months  - 10/09/18 eval w GI Dr. Donell Sievert: At last visit was given Phenergan and pantoprazole.  [place] with fatty liver and left renal cyst.  Labs with negative HCV HBV.  Neg for other causes of abnormal LFT.  He has been seen at [facility] and a nerve biopsy done with nonspecific findings.  He is watching sugar intake.  He reports less of nausea less of feelings of  incapacitation.  He is on new pain medication and this works well.  He is on Elavil at bedtime and he sleeps better.  He does feel better still fatigue but try to fight through it.  He is seeing endocrinologist at [facility] and [facility].  He looks better than he did at last visit.  Nausea and reflux are better with treatment.  Recommend low carbohydrate diet.  Lean protein and low-fat diet.  Gave him names of local endocrinologist and rheumatologist to consult with closer to home.  Follow-up in 3 months  -09/06/2018: AST 38 ALT 56, nl ceruloplasmin, nl alk phos. Nl bili, ANA neg, ferritin wnl, Hep C neg, Hep B Sag neg, Hep B Sab +, celiac negaive  - 09/03/18: eval w GI Dr. Jodie Echevaria:   -08/09/18 AST 65 ALT 110       . IFG (impaired fasting glucose) 02/13/2018     Priority: Medium     - A1C 6.3 09/06/18  - A1C 6.2 02/14/18 FBG 104  - A1C 5.9 09/04/17 with [facility] labs     . Basal cell carcinoma (BCC) of skin of nose 01/29/2018     Priority: Medium     Dx in June 2019 recommended for MOHS in July 2019     - eval w Dr. Rickard Patience 01/02/18: s/p biopsy L nasal area     . Spondylosis of cervical region without myelopathy or radiculopathy 08/09/2017     Priority: Medium     - MRI C Spine w/o contrast: C2-C3: Small posterior disc bulge partially effacing the ventral CSF and gently indenting the ventral cervical spinal cord.  There is no underlying cord signal abnormality.  There is no central canal nor is there foraminal narrowing.  C3-C4: There is a disc osteophyte complex with a superimposed posterior central disc protrusion which partially effaces the ventral CSF and indents the ventral cervical spinal cord.  There is no central canal stenosis or foraminal narrowing.  C4-C5: There is a disc osteophyte complex with prominent posterior lateral disc component within the foramina bilaterally, right greater than left.  Posterior disc bulge partially effaces the ventral CSF and indents the right ventral cervical spinal  cord.  There is mild central canal stenosis.  There is no foraminal narrowing.  C5-C6: There is disc space narrowing and disc desiccation.  There is a disc osteophyte complex with prominent uncovertebral hypertrophy, right greater than left.  Posterior disc bulge partially effaces the ventral CSF.  There is no central canal stenosis.  There is mild to moderate right-sided foraminal encroachment.  The left foramen is patent.  C6-C7: There is disc space narrowing and disc desiccation.  There is a disc osteophyte complex with right posterior lateral disc herniation partially effacing the ventral CSF and mildly flattening.  The underlying cervical spinal cord.  There is marked right-sided foraminal encroachment as well as mild left-sided foraminal narrowing.  There is facet arthropathy.  C7-T1: There is no evidence of central canal or foraminal stenosis.  Overall impression: Multilevel cervical spondylosis as described in detail above.  - evaluation w [facility] Dr. Penni Bombard Nov 2018     . History of insomnia 01/17/2017     Priority: Medium     Chronic, with previous evaluations with multiple providers    September 23, 2019: Eval with Dr. Eilleen Kempf: Sx well controlled with combination melatonin 10 mg and amitriptyline 50 mg at night.  Complete sleep study once pandemic has subsided.  Continue modafinil follow-up 6 months  - 04/04/19: eval w Dr. Eilleen Kempf: Continues to do well taking amitriptyline and melatonin at night.  He goes to bed at 11 with a variable sleep latency.  Pt will wake up numerous times during night to use bathroom.  Wakes in morning between 8-9 feeling partially refreshed.  He reports daytime fatigue and takes naps.  He has an Epworth 5 and does not fall asleep driving.  He has benefited from modafinil.  He takes up to 400 mg approxy 4 days/week as needed.  Chronic sleeping difficulties in setting of fibromyalgia chronic pain mood disorder and circadian rhythm disorder.  Pt sx have been well  controlled with a combination of melatonin 10 mg and amitriptyline 40 mg at night.  He continues to benefit from modafinil clonazepam as needed follow-up 5 months  - 01/02/19: eval w Dr. Eilleen Kempf:  sleep has improved with combination amitriptyline melatonin.  Moderate benefit from modafinil at 200 mg.  Rec polysomnogram once pandemic subsided.  Continue modafinil clonazepam as needed   - now seeing CBT therapist Dr. Lanier Ensign     . Secondary adrenal insufficiency 01/17/2017     Priority: Medium     - 04/26/18: Dr. Beckie Salts [facility]): Pt's initial eval showed possible borderline glucocorticoid deficiency.  He has however been taking supraphysiologic doses of hydrocortisone since 2017.  We do not think he has a hydrocortisone absorption problem based on random serum cortisol and 24-hour values.  We believe episodes of abd pain and nausea improved with SDS because of anti-inflammatory effects.  We are unfortunately unable to generate a unifying endocrine dx that explains his sx.  The pt understands dangers of  continuing supraphysiologic glucocorticoid replacement but reluctant to reduce hydrocortisone dose for fear of worsening illness.  We do feel other hormone replacement doses are more appropriate.  We do not rec changes.  The patient asked for referral to fibromyalgia specialist.  We can check and see whether [facility] has any studies.  Pt with h/o primary hypothyroidism has an empty sella and biochemical e/o hypogonadism and GH deficiency.  Originally, ACTH test was borderline, but now after long-term glucocorticoid tx he has obvious central adrenal insufficiency.  His thyroid and GH doses are if anything on high side.  We cannot attrribute sx to underdosing.  We have explained this to Dr. [name] and we have contacted [facility] colleagues to see if fibromyalgia expert or clinical trial are available there.  - 09/29/17 Dr. Tamera Punt endo: Hypopituitarism in setting of brain surgery for meningioma. Patient is  currently on [facility] for (AI), levothyroxine, testosterone and GH replacement. The question as I discussed with him is whether he is using HC to treat non specific sx or what he refers to as "quality of life" or whether he does need high doses due to some issue with HC metabolism. I explained to him sx he experiences are not specific for adrenal disease and there is concern about chronic over exposure to corticosteroid tx, with which he is familiar. I encouraged him to slowly taper dose of HC to physiologic dose, in range of 20 mg in am and 10 mg in pm. One can also use dose that is based on body surface area. In case of suspicion of "adrenal crisis", I suggested he use\ one injection of Solucortef rather than taking high oral doses of HC for several days. I also recd opinion with Dr. Leonides Grills, Chief, Section of Endocrinology at [facility], in order to discuss adrenal issue. I'm happy to continue to see him at [facility]. I also explained to him that have not prescribed GH replacement for adults so he will continue to follow with [facility] clinic. He can return in few months or as needed.  - 09/06/17: eval w Endocrine Dr. Eyvonne Mechanic [facility]): Although after we dx secondary adrenal insufficiency, GH deficiency, hypogonadotropic hypogonadism, and primary hypothyroidism associated w a partially empty sella on imaging, and he had initial positive response to replacement as time has gone on, he has not done well and has had episodes where he gets virus where he gets very ill w muscle aches like he always has flu and nausea, vomiting, lightheadedness and in general on physiologic hydrocortisone, he awakes in morning with discomfort, particularly in upper back , and after taking meds it will be several hrs until he has enough energy to get up and do just minor tasks. Around [place], he was ill with what sounds like viral illness for 3 days and had a crisis. He was treated w 100 mg of hydrocortisone orally every 8 hours, and symptoms  resolved. He subsequently tapered down on oral hydrocortisone and in last 3 or 4 days has been taking between 130 and 110 mg total per day and continues to feel well as he is slowly tapering this down. We are collecting a 24-hour urine free cortisol on this dose. His other hormone levels are interesting as well. He is on GH at 0.3 mg a day. In past, this had IGF-1's at the upper end of normal range to mildly elevated, now Z-score is just +1.28 and despite taking AndroGel cream on a daily basis, his testosterone level was only 19, whereas  in past this was normal. He is sure that he had not forgotten testosterone at that time. His thyroid tests are similar to what they have been in past with a normal free T4 of 1.5 and TSH mildly suppressed. The current ACTH is less than 1. Other issues will be discussed below. A/P  #1 Empty sella with pituitary insufficiency  #2 Secondary adrenal insufficiency  It is very unusual that he feels perfectly well now after several days of high-dose hydrocortisone. The explanation for this is unclear, but I think it boils down to 1 of 4 things. Either he is malabsorbing hydrocortisone, he is super metabolizing hydrocortisone, he has developed cortisol resistance, or he has an undiagnosed steroid-sensitive disease. There is really no evidence for some other disease with all the other testing that we have done including multiple rheumatologic antibodies. I did order genetic gene interaction testing and am waiting for review of this from pharmacologist to see if there is any suggestion of metabolism abnormalities regarding his various hormones. The 24-hour urine cortisol should give some sort of hint regarding absorption. In any event, the plan we have developed is to continue to slowly taper oral hydrocortisone over next several days to weeks, and I would note a tapering schedule for this depending on clinical response hopefully we will be able to maintain him not feeling well without  significant adverse glucocorticoid effects. Along these lines, I will also get a baseline bone density and body composition, and I am going to check a cortisol level this morning 2 to 3 hrs after he took 50 mg of Cortef, and I am going to repeat testosterone 1 to 2 hours after he applied AndroGel. #3 Secondary hypogonadism, on replacement : Unclear why his testosterone is so different than before where we test this. It may simply be a lab error, but it is unlikely that he did not apply testosterone which he does every morning. Both he and his wife are sure that he took it. #4 Growth hormone deficiency:   Again is unclear why the same dose is resulting in the lower IGF-1, but will increase dose to 0.4 mg per day.  #5 Primary hypothyroidism, on replacement  There is likely a pituitary component to this as well, and will maintain current replacement which is keeping his free thyroxine very stable at 1.5.  #6 Cognitive dysfunction  This was thoroughly evaluated a year ago without a specific diagnosis. He feels now after several days of higher doses of hydrocortisone that his thinking is definitely clearing. #7 Dry eyes  Appreciate the evaluation from Ophthalmology.  #8 Temperature dysmodulation  Still feels this occasionally, but this has also improved with the hydrocortisone. There is no evidence for dysautonomia. #9 Mild ascending aortic dilation and aortic insufficiency  Would recommend repeating an echo next year, which would be 2 years from original. This remains asymptomatic.  #10 Meningioma  This is unchanged on MRI scanning from 2008 and confirms a slight increase from 2017. Neuro has rec observation. [name] from Neurosurgery will review as well. #11 Muscle pain  This has basically resolved with the high doses of hydrocortisone. I doubt there is underlying muscle disease, and it may well be a manifestation of partial adrenal insufficiency as it has this achy typical feeling patients get in their muscles.   #12 Hyperglycemia  The fasting glucose of 157 was nonfasting, and we will continue to monitor this.  #13 Unusual cutaneous response to minimal trauma  He brings 2 photos, one  of his hands and one of his foot. He bumped his knuckles likely on a treadmill, and he developed erythema on the back of his hand, which extended to the 1st interphalangeal joints of his fingers, and there was a very distinct demarcation around this area of erythema, which resolved after about 6 hours. Similarly, a pair of shoes that was a little uncomfortable that he had on for just a couple of hours; when he took his shoe off, there was demarcated erythema around the 1st metatarsophalangeal joint, and once again, this resolved. I am not sure what the nature of this is. Perhaps Rheum has an idea.  - 08/31/17: Pt email: I was advised by pcp to take 100 mg of hydrocortisone Q8H for three days and then double my usual dose (25-30 mg/day) until I returned to [facility]. The rescue dosage worked like Pharmacist, community. I began to feel better almost immediately and now nausea, loss of appetite, dizziness with standing, chills, cold feet, and perceived cognitive difficulties have markedly improved. I cannot remember the last time I felt this good!   - 11/23/16: eval w Endocrine Dr. Tawanna Cooler [place] [facility]): due for reassessment of FT4 and IGF 1 in 4 weeks after changing dose on 10/27/16.  Pursue recs for insomnia from psych. MRI did not show signs of dementia. PFT nl. FU 6-12 mos  - 10/24/16: eval w Endocrine Dr. [place]awanna Cooler [place] [facility]): despite adequate replacement w levothyroxine, GH and hydrocortisone he still has significant fatigue, although there has been a definite benefit from the hormone treatment. Levothyroxine dose is a little high given the slightly low TSH and this is not uncommon to see replacement dose requirements decrease for thyroid replacement when [facility] is initiated. We are waiting on reports on IGF1 and testosterone.      . Growth hormone deficiency 03/29/2016      Priority: Medium     - Jan 2019: eval w Dr. Madie Reno [facility]) see recs under adrenal insufficiency in problem list  -- 10/31/16: eval w Endocrine Dr. [place]awanna [facility]er [place] [facility]): increase GH to 0.4 mg daily  - 08/26/16:  eval w Dr. Tawanna Cooler [place] [facility]): suspect that these issues will improve longer he is on [facility] .Marland KitchenMarland Kitchen Therefore am not concerned with him taking 7.5 mg -10 mg of pred a day or equiv if that is what he needs in next several months.          . History of meningioma of the brain 11/09/2015     Priority: Medium     12/09/02: s/p R parieto-occipital parasagittal craniotomy for 85-90% resection of R pareito-occipital falx meningioma that originally measured 61 x 24 mm    - 09/02/19: eval w Dr. Nilda Calamity (neurology): He is due for MRI for meningioma which does border on posterior cingulate cortex is important in memory function.  Order for MRI provided if this normal I would rec repeat study in a year.  If abnormal would have him schedule for follow-up to discuss further  - 09/03/18: MRI: [facility]: Modest increase in residual left parafalcine tumor volume noted when compared back to 2018 but wo significant change when compared to 2019.  - 09/06/17: electronic eval w [name] (neurosurg-[facility]): Dr. Rosanne Ashing residual occipital parasagittal meningioma appears stable between 2017 - 2019 though it enlarged a little between 2008 - 2017. It is now calcifying, suggesting that further growth unlikely. I do not think it is responsible for ongoing symptoms. I would suggest follow up MRI in 2 years.  - 09/05/17: eval w Dr.  Vira Blanco (Neuro- [facility]): I reviewed MRI shows posterior falcine and parafalcine meningioma minimally enlarged c/w Jan 2017 and not sig changed since April 2018. 1. Parasagittal meningioma essentially stable and calcified on CT. Dr.Parney advised against reoperation if stable; it is more stable than unstable but at request of Dr. [name] , I will request E consult from [name]. 2. I am unsure about episodes of  dysphoria, fatigue, nausea and other sx. A favorable response to steroids may suggest AI especially in context of known pituitary problems. However, corticosteroids may have favorable effects in many situations. He is scheduled to see Dr. Madie Reno in endo. 3. I am unsure of cause of chronic pain. He really does not have trigger points that would be in keeping w fibromyalgia and would be unusual with severe chronic fibromyalgia not to have demonstrable trigger points. This problem has been going on for quite a long time. 4. I reviewed his autonomic reflex studies which do not provide evidence for dysautonomia. As noted, he is able to get erections and ejaculate, which is reassuring   - MRI Brain w/wo contrast 06/07/16: Again seen is a L posterior parafalcine meningioma. There is a slightly greater measurement in the cephalocaudal plane which may be related to technique or minimal tumor progression. Progressive opacification of the L mastoid air cells. Otherwise stable exam         . Chronic back pain 11/09/2015     Priority: Medium     - [facility]: Rec tramadol for severe breakthrough pain.  MRI of cervical spine ordered.  Continue at-home exercises and stretches and physical therapy.  Follow-up in 4 weeks for further evaluation  - As of May 2018 pt reports he gets his pain medication and klonazepam from [facility]  - 07/01/16: note from [facility] Dr. Toni Amend: Pt informed I will no longer refill percocet, clonazepam, or provigil as there are no clear disease indications. I advised pt that he will need to have either rheum or pain specialist provide if they feel indicated. Pt has shown no evidence of abuse, but also has been on meds for years and no benefit. Pt is currently being tx for panhypopit and adrenal insuff, hypogonadism, and hypothyroidism as result, as well as, GH deficiency. Further analysis suggests polypharmacy with potential drug interactions. The pt knows to wean  clonazepam and has asked for 30days of tramadol to wean from percocet. Note he is only on a half tablet of percocet qid. No refills will be provided. Pt is aware.   - 2005: developed R hip and leg pain and was told he had "small central disk herniation" at L5-S1. Surgery not recommended. He received PT and some epidural corticosteroid injections which improved but did not completely resolve the situation. Low back and leg pain recurred in 2006 and was again tx w steroid and PT.      . Essential hypertension 01/24/2013     Priority: Medium     Treated with amlodipine and ramipril and coreg    - 09/07/18: eval w cardiology Dr. Harlow Asa: recommend echo, continue current antihypertensives, FU 1 year  - eval w Dr. Ova Freshwater (Nephor/HTN at [facility]): BP seems better, monitor home readings - not having significant autonomic dysfunction  -ECHO 10/27/16 [facility]: nl LV size and systolic function w EF of 65% - mild AV regurgitation and mild ascending aortic dilatation - recommend repeat ECHO in 12 months     . Mixed hyperlipidemia 01/24/2013  Priority: Medium     Pt reports he has taken statin for many years (was taken off to see if this would help with fibromyalgia pain / wo improvement) and zetia and tricor. As of June 2019 not on tricor (concern with zetia and tricor contributing to risk of myopathy)    TC 177 TRI 237 HDL 54 LDL 76 02/14/18  TC 161 TRI 112 HDL 81 LDL 86 09/14/16          . Primary hypothyroidism 01/24/2013     Priority: Medium     Check FT4 in addition to TSH for him    - FT4 7.4 wnl as of 09/06/18  - FT4 2.23 03/23/18 (H)  - 03/12/18: email from endocrin Dr. Madie Reno at [facility]: " You are correct regarding thyroid replacement dose. TSH not reliable. All you really need in addition is FT4 which he states normal.. You values here in past have been around 1.5. If it is still in this range I would not change levothyroxine dose. Re testosterone, it would be good to get free testosterone or bioavailable testosterone  along with total to be sure dose is correct. Regarding GH, if you have only been off of it for a week or two, you can start back on same dose. If it has been longer you should probably start at 0.2 mg/day for 2 weeks then increase to 0.3 mg.        . History of asthma 01/24/2013     Priority: Medium     - since childhood    - 11/23/16: eval w Dr. Renita Papa (Pulm at [facility]): bronchial asthma mild, allergic rhinitis postnasal drainage: uses albuterol BID for chest tightness. Check nitric oxide test. He has had intermittent eosinophilia and this points towards an eosinophilic asthma phenotype. He does have allergic rhinitis and think he would be better servied with our triple combination nose spray which includes mometasone, diphenhydramine and iptratroprium. I gave him rx for this.      . Mild OSA  01/17/2017     Priority: Low     - 10/31/16: eval w sleep medicine Dr. Ala Dach  [facility]): reviewed original records from sleep study in Sept 2017 and CPAP trial in Oct 2017 ... My opinion at this time his OSA is not sig enough to require focused effort on using CPAP consistently ... Particularly since he reports not having found CPAP to be helpful or restful, I think it is best to set it aside for time being and at some point another sleep lab study and titration may be helpful. ... Depending on comorbidities this degree of OSA is worth an effort at treating but when there has not been any consistent benefit even in the past when he tried it consistently, then it is reasonable to put on hold for the time being.      . 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 James Ivanoff: "complicated situation. Recommend trying remeron 15 mg to help w sleep, mood and anxiety. ... If his sleep can be improved that could potentally improve his cognition. Try it for at least 6-8 weeks. He can let me know how he is doing via patient portal. I also  recommended that he try to simplify his medication and situations, meaning not using klonopin or percocet. I cautioned him on marijuana use. Recommend continuing w cognitive behavioral therapy and psychotherapy  techniques.      . Amnestic MCI (mild cognitive impairment with memory loss) 11/20/2016     Priority: Low     - had dizziness on donepezil and remeron - [place] both -     - 04/17/19: eval w Neurology Dr. Nilda Calamity: Multiple medical problems and reduced quality of life due to chronic pain insomnia and fatigue and memory concerns.  He would like to try a new possible treatment avenue for his chronic fatigue and fibromyalgia involving treatment of possible viral pathogens.  I told him I am not comfortable treating him in this way with famciclovir and Celebrex but I recommended that he see another physician who has interest in fibromyalgia and chronic fatigue as well as possible viral connections with these conditions.  Referral was made.  I will continue to follow him for his memory concerns and his meningioma.  I have no suspicion for Alzheimer's dementia.  The meningioma does border on the posterior cingulate cortex which is important in memory function and will be monitored periodically.  He sees Dr. Theodoro Grist for insomnia and daytime sleepiness.  I will see him again virtually in a few months  - 11/27/18: eval w Neurology Dr. Nilda Calamity: Multiple medical problems and reduced quality of life due to chronic pain insomnia and fatigue with more recent concerns about memory.  At this point based on history and exam as well as review of records I have no reason to suspect Alzheimer's type dementia.  The meningioma is bordering on aspects of the posterior cingulate cortex which is important in memory function so this is potentially a cause.  However I am skeptical of the MCI diagnosis and I would think that abbreviated follow-up testing is important.  He has severe daytime fatigue and sleepiness and unrefreshing sleep.  He is  seeing Dr. Theodoro Grist for further advice on this.  I will prescribe Provigil for his daytime fatigue as I intended to previously.  His meningioma needs continual follow-up.  Although there was no obvious growth with the recent exam I did feel that the meningioma seemed a bit more robust and greater in volume than it did in 2017 but this may have been due to the greater degree of gadolinium uptake which was probably a technical issue.  His understanding was that the meningioma was calcified and unlikely to grow.  I will see him routinely again in 6 months.  - 07/24/18: eval w Neurology Dr. Nilda Calamity: At this point ... I have no reason to suspect Alzheimer's type dementia. Meningioma is bodering on aspects of the posterior cingulate cortex which is important in memory function, so this is potentially a cause. However I am skeptical of the MCI diagnosis and I would think that abbreviated follow up testing is important. He has severe daytime fatigue and sleepiness and unrefreshing sleep. I would like him to see a sleep specialist in consultation. He may need to go through another sleep study or MSLT. For now because of his fatigue and sleep attacks I prescribed Provigil. FU in several months.   - 05/22/18: eval w Neurology Dr. Nilda Calamity:  At this point, I am hoping that treatment of the fibromyalgia and insomnia will be helpful to him.  I will increase the gabapentin to 600-900 mg at night for both issues.  I recommended that he read over the notes from the fibromyalgia specialist, which included multiple excellent recommendations and some explanations of the condition.  A short course of acyclovir was prescribed because what he believes is  a herpes recurrence, but he should follow-up with his primary care physician about this.  We are avoiding opiates because of concern about central sensitization even if there is short-term benefit.  These would be a last resort because he does indicate that he functions on them in the past  and he never escalated his dosage.  I will see him again in about a month.  - 04/11/18: eval w Neurology Dr. Harrel Lemon: At this point, based upon his history and exam.  I have no reason to suspect early dementia and I suspect that the [facility] physician was merely being thorough when he offered amyloid scanning.  I did not see neuropsychometric testing in the mail paperwork so I am not sure where the MCI diagnosis arose from.  Based on today's exam history of what suspect that this is more in line with a case of normal aging some degree of brain injury from the meningioma the effects of depression, fatigue, chronic pain and physical and mental deconditioning along with normal aging resulting a picture of reduced working memory and attention primarily.  I will try to retrieve any neuropsychometric testing results from May of the bites just otherwise.  Patient will be going for an endocrine evaluation at [facility] in the near future.  I suggested he follow up with me in a couple of months after having a chance to review his additional records.  Seems to me that pain control.  Something that is missing in his case.  In the [facility] is coming down with recommendations regarding treatment of fibromyalgia.  The focus on central sensitization and suggest avoiding opiates including tramadol.  He did function when he was on the Percocet by his reports.  At follow-up, I will inquire as to how he fared with some of the [facility] recommendations from the fibromyalgia specialist.  Insomnia remains severe.  I will follow up on whether he has fared well with Remeron.  He has not tried trazodone.  Amitriptyline should be considered for its anti-insomnia and chronic pain, benefit, and I will ask him about this unless he has dry eyes and dry mouth symptoms.  The meningioma is being followed and I agree with [facility] approach of watching and waiting as it may be calcified and no longer growing.  He will follow-up with  me in about 6 weeks..   - 11/24/16: eval w Neurology Dr. Betsey Holiday [facility]): He does have insomnia, and remeron just commenced which makes good sense. He may also benefit from cholinergic stimulation and therefore donepezil may be helpful here. .. We discussed potential role of amyloid PET testing. I will arrange amyloid PET scan. If negative, early AD is effectively ruled out. Consider brain rehab program. Meet back after PET testing.   - 10/31/16: eval w Endocrine Dr. Eyvonne Mechanic [facility]): Dr. Earney Hamburg completed neuropsychologic testing and incidates he has very focal impairment in verbal memory and to a lesser extent in visual memory and indicates this is consistent with amnestic variety of mild cognitive impairment. Neurology is arranging for further testing with MRI for cognitive dysfunction and brain PET scan.   - 10/28/16 eval w Dr Azucena Kuba [facility] Neuropsych): impaired performance in several measures of verbal learning and memory and some marginally poor performances in measures of nonverbal learning and memory. Marland KitchenMarland KitchenMarland KitchenThese results are significant and while neuropsychological testing in and of itself cannot diagnose any particular condition you would potentially benefit from further discussion, possibly with colleagues in Neurology to determine  if there is underlying condition that may be contributing to memory difficulties even beyond history of brain tumor.      . Hemianopia, homonymous, left 09/15/2015     Priority: Low     - 09/04/17: eval w Dr. Jerre Simon [facility]): He has a left homonymous hemianopia from the prior occipital lobe meningioma and resection. I will get a baseline visual field since his last exam was 2 years ago. He reports dry eye. Prior SSA and SSB were negative. Schirmer's test today showed normal tear production. The dry eye is mostly from meibomian gland dysfunction and therefore unrelated to Sjogren's. His main visual complaint is blurred vision and tunnel vision during his "spells" that  he attributes to low cortisol. He is currently being treated with cortisone and has minimal symptoms. A lack of cortisone would not typically cause visual symptoms, but if his blood pressure was dropping, this could be contributing. He is scheduled for autonomic testing later this week. Addendum: 24-2 Humphrey visual field showed a small left inferior homonymous scotoma  - evaluation w Ophthalmology Dr. Ferd Glassing /24/17:  this patient continues to do exceptionally well almost 13 years following resection of a right parietooccipital falcine meningioma. Prior to surgery, he had an inferior left homonymous defect that improved following surgery and has subsequently stabilized with minimal residual.Even though he has a moderate residual lesion abutting and occluding the posterior sagittal sinus, with slight compression of the medial aspect of the left occipital lobe, his examin today remains absolutely stable with no evidence of a worsening field defect and no evidence of papilledema or optic atrophy. He has a stable nevus in the right fundus. I will see him again in 1-2 years. In meantime, he has asked for a suggestion of an endocrinologist who deals with pituitary hormone dysfunction and he also may be interested in seeing a radiation therapist for discussion of stereotactic radiosurgery using a gamma knife (as opposed to the Cyberknife). I have recommended that he discuss these issues with [name] when he sees him tomorrow.     . Secondary male hypogonadism 01/24/2013     Priority: Low     - - 10/31/16: eval w Endocrine Dr. [place]awanna Cooler [place] [facility]): testosterone level is excellent right now - continue current therapy         . Marijuana use 07/12/2018   . Onychomycosis 02/13/2018   . Xerostomia 12/21/2017     eval w dentist in April 2019 and recommended for tx w evoxac     . Left testicular pain 09/13/2017     09/07/17 evaluation w Urology at [facility]: PLAN: 1 Left Testicular Pain We discussed current symptomatology. We  discussed results of ultrasound which does not note concerning pathology to account for left-sided testicular pain. He has noted improvement with use of scrotal support. I did discuss with him consideration of referral to our PMR colleagues, potential PT for pelvic floor assessment or question as to whether nerve block would be appropriate. At this point in time the patient would like to defer anything invasive and is reassured with negative imaging.             . Recurrent sinusitis 01/17/2017     - 11/21/16: eval w ENT Dr. Jannette Spanner [facility]): For recurrent sinusitis, he is on good medical regiment w daily nasal irrigations. Surgery would be indicated for 4-5 episodes of sinusitis per year. He was not interested in operative intervention and this is reasonable. In re to his deviated septum  septoplasty could be considered. He was not interested at this time. To address his nasal obstruction on a mucosal basis I recommended triple spray. Avoid afrin. Offered him fluoroscopic eval of his dysphagia but he deferred. Would not recommend surgery for OSA.      [name] BPH (benign prostatic hyperplasia) 01/24/2013     As of 11/13/19 pt noting diminished urinary flow - req trial of flomax 0.4 mg qhs -          The patient's past medical history, surgical history, medications and allergies were reviewed.    Allergies   Allergen Reactions   . Bextra [Valdecoxib] Rash       Medications  Current Outpatient Medications   Medication Sig Dispense Refill   . albuterol (PROAIR HFA) 108 (90 Base) MCG/ACT inhaler INHALE ONE PUFF BY MOUTH EVERY FOUR TO SIX HOURS AS NEEDED FOR WHEEZING (Patient taking differently: 2 puffs 2 (two) times daily INHALE ONE PUFF BY MOUTH EVERY FOUR TO SIX HOURS AS NEEDED FOR WHEEZING  ) 25.5 g 1   . amLODIPine (NORVASC) 5 MG tablet Take 1 tablet (5 mg total) by mouth 2 (two) times daily 180 tablet 1   . BELBUCA 450 MCG Film 2 (two) times daily        . Calcium Carbonate-Vitamin D (CALCIUM + D PO) Take by mouth. 500/400mg    daily      . carvedilol (COREG) 6.25 MG tablet TAKE TWO TABLETS BY MOUTH TWICE DAILY WITH FOOD 360 tablet 1   . Coenzyme Q10 100 MG capsule Take 100 mg by mouth daily.     . cyanocobalamin 1000 MCG tablet every 24 hours     . doxycycline (VIBRA-TABS) 100 MG tablet Take 1 tablet (100 mg total) by mouth 2 (two) times daily for 5 days 10 tablet 0   . ezetimibe (ZETIA) 10 MG tablet Take 1 tablet (10 mg total) by mouth daily 90 tablet 3   . finasteride (PROSCAR) 5 MG tablet Take 1 tablet (5 mg total) by mouth nightly 90 tablet 3   . hydrocortisone (CORTEF) 20 MG tablet Take 4 PO f 3 days, then t3 po for 1 day, then 2 po for 1 day then 1 po 20 tablet 0   . levothyroxine (SYNTHROID) 150 MCG tablet TAKE ONE TABLET BY MOUTH ONE TIME DAILY AT 6 PM 90 tablet 0   . Linzess 145 MCG Cap capsule Take 145 mcg by mouth daily     . montelukast (SINGULAIR) 10 MG tablet Take one tablet by mouth at bedtime as directed 90 tablet 1   . Multiple Vitamins-Minerals (CENTRUM SILVER PO) Take 1 tablet by mouth daily.       . ondansetron (ZOFRAN-ODT) 4 MG disintegrating tablet 4 mg as needed     . pilocarpine (SALAGEN) 5 MG tablet Take 1 tablet (5 mg total) by mouth 3 (three) times daily (Patient taking differently: Take 5 mg by mouth 3 (three) times daily as needed   ) 270 tablet 1   . prochlorperazine (COMPAZINE) 10 MG tablet Take 5 mg by mouth as needed        . promethazine (PHENERGAN) 25 MG suppository as needed     . pyridoxine (B-6) 100 MG tablet Take 100 mg by mouth daily.     . ramipril (ALTACE) 10 MG capsule Take 2 capsules (20 mg total) by mouth nightly 180 capsule 0   . simvastatin (ZOCOR) 20 MG tablet Take 1 tablet (20 mg total) by mouth nightly  90 tablet 1   . SYMBICORT 80-4.5 MCG/ACT inhaler Inhale 2 puffs into the lungs two times daily (Patient taking differently: 2 puffs 2 (two) times daily Have patient rinse mouth after use  ) 10.2 g 3   . tamsulosin (FLOMAX) 0.4 MG Cap Take 1 capsule (0.4 mg total) by mouth Daily after dinner  90 capsule 0   . testosterone (ANDROGEL) 25 MG/2.5GM (1%) Gel 2.5 mg as needed Has urination issues when using    1   . VITAMIN D, CHOLECALCIFEROL, PO Take 5,000 Units by mouth daily.        . Xifaxan 550 MG Tab Take 550 mg by mouth 3 (three) times daily     . amitriptyline (ELAVIL) 10 MG tablet      . clonazePAM (KLONOPIN) 0.5 MG tablet Take 0.5 mg by mouth daily as needed     2   . modafinil (PROVIGIL) 200 MG tablet 100 mg daily as needed        . polyethylene glycol (MIRALAX) 17 g packet Take 17 g by mouth daily 30 packet 0     No current facility-administered medications for this visit.       Review of Systems  CONST: 3 lb WG since last visit, no fevers/chills sweats, + fatigue, + muscle aches  MOUTH: No sore throat, oral lesions, difficulty swallowing  NECK: No swollen glands, stiffness or pain  CV: No chest pain, palpitations, leg swelling  RESP: No shortness of breath, cough, wheeze, asthma  GI: No new vomiting, diarrhea, constipation, abd pain, heartburn, blood in stool  MALE GU: + frequency, no hematuria,   NEURO:  No headache, lightheadedness, dizziness, loss of consciousness, numbness/tingling, weakness    Physical Exam  BP 164/57 (BP Site: Right arm, Patient Position: Sitting, Cuff Size: Medium)   Pulse (!) 51   Temp 98.4 F (36.9 C) (Oral)   Wt 80.7 kg (178 lb)   SpO2 94%   BMI 26.29 kg/m   Wt Readings from Last 3 Encounters:   11/27/19 80.7 kg (178 lb)   03/20/19 79.4 kg (175 lb)   02/11/19 82.8 kg (182 lb 8.7 oz)     GEN: Well developed well nourished male alert and appropriate in no apparent distress appearing tired  HENT: oropharynx normal w no exudate or erythema  EYES: PERRL,  extra ocular movements intact, no pallor or scleral icterus, normal conjunctiva  NECK: supple, no thyromegaly or nodules appreciated  LYMPH: no cervical or supraclavicular lymphadenopathy appreciated  CV: regular rate and rhythm no m/r/g.  No lower extremity edema.  2+ radial pulses present and equal.  RESP: clear to  auscultation bilaterally, normal effort, no rhonchi or rales  GI: soft, nontender/ nondistended, normoactive bowel sounds, no rebound or guarding  NEURO: PERRLA, extra ocular movements intact, face symmetric, hearing intact to voice, palate raise, shoulder shrug and neck flexion intact, tongue midline. Moves all extremities.   L UE: small Abrasion to skin left upper extremity - c/d/i with no pus or prominent tenderness or erythema    Assessment/Plan    1. Dog bite, initial encounter  Recommend doxycycline 100 mg twice daily for 5 days    2. Essential hypertension  He monitors his blood pressure at home and generally at home his blood pressures are controlled continue current medications    3. Secondary adrenal insufficiency  Previous episodes similar to this have responded to a steroid burst recommend he increase his steroid dose of Cortef from 20 mg daily at  baseline to 80 mg for 3 days then taper by 20 mg daily back to baseline.  - Basic Metabolic Panel  - CBC and differential  - hydrocortisone (CORTEF) 20 MG tablet; Take 4 PO f 3 days, then t3 po for 1 day, then 2 po for 1 day then 1 po  Dispense: 20 tablet; Refill: 0    4. Primary hypothyroidism  - TSH  - T4, free    5. Elevated ALT measurement  - Hepatic function panel (LFT)    6. Urinary frequency  - Urinalysis Reflex to Microscopic Exam- Reflex to Culture    7. Current chronic use of systemic steroids  - Hemoglobin A1C    8. Secondary male hypogonadism  - Testosterone    9. Hyperglycemia  - Hemoglobin A1C    10. Irritable bowel syndrome with constipation / Cyclic vomiting syndrome  He has not been taking his Elavil and from previous notes from Dr. Jodie Echevaria seem to indicate that he should be taking this and I think this will be beneficial to help him with sleep and mood and pain unless it was thought to be significantly constipating.     Orders Placed This Encounter   Procedures   . Basic Metabolic Panel   . Hepatic function panel (LFT)   . CBC and differential   .  Urinalysis Reflex to Microscopic Exam- Reflex to Culture   . TSH   . T4, free   . Hemoglobin A1C   . Testosterone     Risks & benefits of the new medication(s) were explained to the patient, who appeared to understand and agrees to the treatment plan.    Gloriajean Dell, MD  [facility]  [address], [address]  [place], [place] [address]  P) [phone]  F) [phone]  [email or web address]

## 2019-11-28 ENCOUNTER — Encounter: Payer: Self-pay | Admitting: Internal Medicine

## 2019-11-28 LAB — BASIC METABOLIC PANEL
African American eGFR: 98 mL/min/{1.73_m2} (ref >59)
BUN / Creatinine Ratio: 25 — ABNORMAL HIGH (ref 10–24)
BUN: 23 mg/dL (ref 8–27)
CO2: 23 mmol/L (ref 20–29)
Calcium: 9.5 mg/dL (ref 8.6–10.2)
Chloride: 103 mmol/L (ref 96–106)
Creatinine: 0.91 mg/dL (ref 0.76–1.27)
Glucose: 125 mg/dL — ABNORMAL HIGH (ref 65–99)
Potassium: 4.3 mmol/L (ref 3.5–5.2)
Sodium: 140 mmol/L (ref 134–144)
non-African American eGFR: 85 mL/min/{1.73_m2} (ref >59)

## 2019-11-28 LAB — HEPATIC FUNCTION PANEL
ALT: 42 IU/L (ref 0–44)
AST (SGOT): 29 IU/L (ref 0–40)
Albumin: 4.4 g/dL (ref 3.8–4.8)
Alkaline Phosphatase: 70 IU/L (ref 39–117)
Bilirubin Direct: 0.1 mg/dL (ref 0.00–0.40)
Bilirubin, Total: 0.4 mg/dL (ref 0.0–1.2)
Protein, Total: 6.7 g/dL (ref 6.0–8.5)

## 2019-11-28 LAB — URINALYSIS REFLEX TO MICROSCOPIC EXAM - REFLEX TO CULTURE
Bilirubin, UA: NEGATIVE
Blood, UA: NEGATIVE
Glucose, UA: NEGATIVE
Ketones UA: NEGATIVE
Leukocyte Esterase, UA: NEGATIVE
Nitrite, UA: NEGATIVE
Protein, UA: NEGATIVE
Urine Specific Gravity: 1.013 (ref 1.005–1.030)
Urobilinogen, Ur: 0.2 mg/dL (ref 0.2–1.0)
pH, UA: >=9 — AB (ref 5.0–7.5)

## 2019-11-28 LAB — CBC AND DIFFERENTIAL
Baso(Absolute): 0.1 10*3/uL (ref 0.0–0.2)
Basos: 1 %
Eos: 1 %
Eosinophils Absolute: 0.1 10*3/uL (ref 0.0–0.4)
Hematocrit: 43.7 % (ref 37.5–51.0)
Hemoglobin: 14.9 g/dL (ref 13.0–17.7)
Immature Granulocytes Absolute: 0.1 10*3/uL (ref 0.0–0.1)
Immature Granulocytes: 1 %
Lymphocytes Absolute: 2.2 10*3/uL (ref 0.7–3.1)
Lymphocytes: 17 %
MCH: 30.7 pg (ref 26.6–33.0)
MCHC: 34.1 g/dL (ref 31.5–35.7)
MCV: 90 fL (ref 79–97)
Monocytes Absolute: 0.6 10*3/uL (ref 0.1–0.9)
Monocytes: 4 %
Neutrophils Absolute: 9.8 10*3/uL — ABNORMAL HIGH (ref 1.4–7.0)
Neutrophils: 76 %
Platelets: 243 10*3/uL (ref 150–450)
RBC: 4.85 x10E6/uL (ref 4.14–5.80)
RDW: 13 % (ref 11.6–15.4)
WBC: 12.8 10*3/uL — ABNORMAL HIGH (ref 3.4–10.8)

## 2019-11-28 LAB — TESTOSTERONE: Testosterone: 188 ng/dL — ABNORMAL LOW (ref 264–916)

## 2019-11-28 LAB — TSH: TSH: 0.155 u[IU]/mL — ABNORMAL LOW (ref 0.450–4.500)

## 2019-11-28 LAB — T4, FREE: T4, Free: 1.47 ng/dL (ref 0.82–1.77)

## 2019-11-28 LAB — REFLEX - MICROSCOPIC EXAMINATION
Bacteria, UA: NONE SEEN
Casts, UA: NONE SEEN /lpf
RBC, UA: NONE SEEN /hpf (ref 0–2)
WBC, UA: NONE SEEN /hpf (ref 0–5)

## 2019-11-28 LAB — HEMOGLOBIN A1C: Hemoglobin A1C: 6.2 % — ABNORMAL HIGH (ref 4.8–5.6)

## 2019-11-29 ENCOUNTER — Other Ambulatory Visit: Payer: Self-pay | Admitting: Internal Medicine

## 2019-11-29 ENCOUNTER — Encounter: Payer: Self-pay | Admitting: Internal Medicine

## 2019-11-29 DIAGNOSIS — Z87898 Personal history of other specified conditions: Secondary | ICD-10-CM

## 2019-11-29 MED ORDER — ZOLPIDEM TARTRATE 10 MG PO TABS
10.0000 mg | ORAL_TABLET | Freq: Every evening | ORAL | 0 refills | Status: DC | PRN
Start: 2019-11-29 — End: 2020-06-05

## 2019-11-29 NOTE — Progress Notes (Signed)
Bite looking better and recovering with steroids.   Could I please get some Ambien? High dose steroids make is very difficult to sleep if at all.   Thanks

## 2019-12-03 ENCOUNTER — Encounter (INDEPENDENT_AMBULATORY_CARE_PROVIDER_SITE_OTHER): Payer: Self-pay | Admitting: Internal Medicine

## 2019-12-10 ENCOUNTER — Telehealth: Payer: Self-pay | Admitting: Internal Medicine

## 2019-12-10 ENCOUNTER — Encounter: Payer: Self-pay | Admitting: Internal Medicine

## 2019-12-10 ENCOUNTER — Other Ambulatory Visit: Payer: Self-pay | Admitting: Internal Medicine

## 2019-12-10 DIAGNOSIS — E2749 Other adrenocortical insufficiency: Secondary | ICD-10-CM

## 2019-12-10 MED ORDER — HYDROCORTISONE 20 MG PO TABS
ORAL_TABLET | ORAL | 0 refills | Status: DC
Start: 2019-12-10 — End: 2019-12-23

## 2019-12-10 NOTE — Telephone Encounter (Signed)
I spoke with the patient over the phone today.  After our last visit he did feel better initially with 80 mg of hydrocortisone for 3 days once daily but that over the last few days noticed worsening symptoms with hot flashes prominent fatigue nausea, unable to get out of bed.  He denies any fever or vomiting.  He denies any diarrhea or change in the stool.  Today he feels like his nausea is slightly better.  I told him his recent blood work was generally unremarkable.  In the past he reports that his previous primary care physician Dr. Toni Amend had given him higher doses of steroids to 80 mg Q8H x 3 which have beneficial effect.  The patient is already gone ahead and taken 80 mg in addition to his 20 mg normal dose as of today.  I told him that if this has been effective in the past that alleviating his symptoms I am not opposed, however his previous endocrinology consultations have not shown clear indication for taking such doses and he is aware of the potential risks of chronic steroid exposure to include insomnia hyperglycemia among others.  I told him that if his nausea or fatigue or inability to take p.o. progressed I would recommend proceeding to the emergency room.  He expressed understanding.

## 2019-12-10 NOTE — Telephone Encounter (Signed)
Patient called with complaints of worsening symptoms since he was last seen in the office.  He states that he started to feel better, but on Saturday he began having increased nausea, decreased appetite and extreme fatigue to the point of not being able to get out of bed.  Patient's wife states that the symptoms are not improving and they would like a return call to discuss next steps.  Please call [phone] or [phone] (wife's phone).

## 2019-12-11 ENCOUNTER — Ambulatory Visit
Admission: RE | Admit: 2019-12-11 | Discharge: 2019-12-11 | Disposition: A | Discharge status: Home / Self Care | Payer: Medicare Other | Source: Ambulatory Visit | Attending: Neurology | Admitting: Neurology

## 2019-12-11 ENCOUNTER — Other Ambulatory Visit: Payer: Self-pay | Admitting: Neurology

## 2019-12-11 ENCOUNTER — Encounter: Payer: Self-pay | Admitting: Internal Medicine

## 2019-12-11 ENCOUNTER — Other Ambulatory Visit: Payer: Self-pay | Admitting: Internal Medicine

## 2019-12-11 DIAGNOSIS — D499 Neoplasm of unspecified behavior of unspecified site: Secondary | ICD-10-CM

## 2019-12-11 DIAGNOSIS — D329 Benign neoplasm of meninges, unspecified: Secondary | ICD-10-CM

## 2019-12-11 DIAGNOSIS — E2749 Other adrenocortical insufficiency: Secondary | ICD-10-CM

## 2019-12-11 LAB — WHOLE BLOOD CREATININE WITH GFR POCT
GFR POCT: >60 mL/min/{1.73_m2} (ref >60)
Whole Blood Creatinine POCT: 1.1 mg/dL (ref 0.5–1.1)

## 2019-12-11 MED ORDER — GADOBUTROL 1 MMOL/ML IV SOLN
8.00 mL | Freq: Once | INTRAVENOUS | Status: AC | PRN
Start: 2019-12-11 — End: 2019-12-11
  Administered 2019-12-11: 8 mmol via INTRAVENOUS
  Filled 2019-12-11: qty 10

## 2019-12-11 NOTE — Progress Notes (Signed)
Yes, still in contact with Dr. Madie Reno. I like him, but it is hard to manage so much pathology long distance.   Brain MRI tonight.   Could I please get another endo referral? Am working on other referrals. But can't even get through to make appointments (voice mail). I am spoiled by the outstanding care and attention from you and your office.   Still feeling improved for now!   Thanks

## 2019-12-18 ENCOUNTER — Telehealth: Payer: Self-pay | Admitting: Internal Medicine

## 2019-12-18 ENCOUNTER — Other Ambulatory Visit: Payer: Self-pay

## 2019-12-18 DIAGNOSIS — E2749 Other adrenocortical insufficiency: Secondary | ICD-10-CM

## 2019-12-18 DIAGNOSIS — I10 Essential (primary) hypertension: Secondary | ICD-10-CM

## 2019-12-18 MED ORDER — CARVEDILOL 6.25 MG PO TABS
ORAL_TABLET | ORAL | 1 refills | Status: DC
Start: 2019-12-18 — End: 2020-01-21

## 2019-12-18 NOTE — Telephone Encounter (Signed)
Patient's wife called stating that the patient has been complaining of dizziness and feeling light headed. She took his blood pressure lying and it was 138/83 and when he stood it was 97/59.  She states that he has loss 5-6 pounds in the last week.  He also complains of burning and dry mouth.  He has continuous nausea and a decreased appetite.  He has been in bed except for going to doctor appointments.  He has an appointment with endocrinology on 5/11 and with rheumatology on 5/12.  He saw the gastroenterologist who put him on Motegrity for constipation and that has helped a lot.  She would like a return phone call.  [phone].    Glennda Weatherholtz NOTE: I spoke with patient today and his wife Angelique Blonder over phone.  I confirmed history above reported by nursing staff.  Patient continues to have periodic "flares" where he feels entirely incapacitated and has difficulty leaving his bed or doing any activity.  Symptoms enerally worse first thing in the morning and abate mildy in afternoon.  He reports he is able to consume coffee and did eat a little bit today - urine color is normal with no clear evidence of dehydration.  He did have evidence of orthostasis previously based on his reported readings above.  Reports baseline heart rate is in 50s and went up to 70s and blood pressure went down into the 90s when standing.  Advised him to Hazel ramipril for now and continue to observe blood pressure.  He had recent blood work generally unremarkable with no major derangements in his thyroid function blood sugar kidney or liver function -slight elevation in his white blood cell count.  His wife is concerned as he is lost 5 or 6 pounds in the last week.     I told him and he admits that the symptoms that he is describing today are not necessarily new and rather have been chronic in nature and have been resistant to diagnosis and treatments from a number of different specialists in the past including at [facility] and at [facility].  The  patient feels frustrated by this.  He feels like he has "long covid" and "incapacitated" but has not had any definitive cause determined whether rheumatologic infectious, endocrine or otherwise.  In the past he had responded to a burst of steroids but during his most recent episode after 3-day course of steroids did not feel particularly improved.  His prior endocrine consultations have queried the benefit of this and the patient is aware of the potential risks of this.  Certainly his symptoms are suggestive of fibromyalgia however he has not had much benefit to multiple medications to improve his function.  I    He does note that his gastroenterologist Dr. Jodie Echevaria put him on Motegrity to help with constipation and that this has helped.     Patient plans to consult again with a rheumatologist with [facility] and endocrinologist with [facility].  Previous consultation with Dr Tamera Punt had wondered about the possibility of using IV steroids to help him during these episodes to see if might be more appropriate or safer than using high-dose oral supplementation.  He plans to see Dr. Tamera Punt again in May and Dr. Mellody Dance in Rheumatology also.

## 2019-12-18 NOTE — Telephone Encounter (Signed)
Patient's wife called stating that the patient has been complaining of dizziness and feeling light headed. She took his blood pressure lying and it was 138/83 and when he stood it was 97/59.  She states that he has loss 5-6 pounds in the last week.  He also complains of burning and dry mouth.  He has continuous nausea and a decreased appetite.  He has been in bed except for going to doctor appointments.  He has an appointment with endocrinology on 5/11 and with rheumatology on 5/12.  He saw the gastroenterologist who put him on Motegrity for constipation and that has helped a lot.  She would like a return phone call.  [phone].

## 2019-12-19 ENCOUNTER — Telehealth: Payer: Self-pay | Admitting: Internal Medicine

## 2019-12-19 ENCOUNTER — Emergency Department
Admission: EM | Admit: 2019-12-19 | Discharge: 2019-12-19 | Discharge status: Against Medical Advice | Payer: Medicare Other | Attending: Emergency Medicine | Admitting: Emergency Medicine

## 2019-12-19 DIAGNOSIS — Z5321 Procedure and treatment not carried out due to patient leaving prior to being seen by health care provider: Secondary | ICD-10-CM | POA: Insufficient documentation

## 2019-12-19 NOTE — ED Notes (Signed)
Dr. Mat Carne spoke with an internal medicine MD from [facility] that was asked to call our facility by the patient before being seen in [facility] ER. After Dr. Mat Carne spoke with the other provider this nurse went out to escort the patient into the ER. Before entering the ER the patient wanted to clarify that if he needed to have a CT or other scans completed he would need to transfer to a facility that offered the services. This nurse informed the patient that we do not have CT scan capabilities and we would have to transfer him to a hospital of his choice that does provide further testing if needed. The patient was offered to be seen in the [facility] ER for assessment and cares but if further work up was needed with his health history he would need to be transferred to a facility with those capabilities. The patient chose to go to [facility] instead of being seen in [place] for his cares today.

## 2019-12-19 NOTE — Telephone Encounter (Addendum)
Patient was seen at [facility] ED -he did not want to have further evaluation at [facility] due to concern over imaging capability and was recommended for evaluation at [facility] and I called [facility] Barge to give report as of 122 pm.

## 2019-12-19 NOTE — ED Notes (Signed)
Pt speaking with own physician on phone prior to entering ER.

## 2019-12-19 NOTE — Telephone Encounter (Signed)
The patient's wife called and stated that the patient's condition is not any better.  She took his blood pressure both lying and standing and it dropped 40 points when he stood.  She believes that he is dehydrated.  She wants to take him to an urgent care facility to get rehydrated with IV fluids. I suggested that she could take him to the free standing ED in [place]. She will let me know if she was able to persuade him to go.

## 2019-12-19 NOTE — Telephone Encounter (Signed)
I spoke with the patient again this afternoon to check in as in theory he was headed to [facility] ED. he reports that he has eaten and drank some fluid and feels somewhat improved.  He had a good bowel movement.  He denies any worsening symptoms of nausea and abdominal pain or orthostasis.  He reports his blood pressure systolic is in the 140s or 150s and when he stands up it is 117/80 but he denies any worsening dizziness lightheadedness fever or other acutely concerning symptoms.  I told him if he is tolerating p.o. food and liquids that he should do so and rest -continue to hold his ramipril for now.  If symptoms acutely worsen he should proceed to the ER again if he cannot tolerate PO or has unrelenting nausea or vomiting or other concerning symptoms.  He expressed understanding.

## 2019-12-21 ENCOUNTER — Encounter: Payer: Self-pay | Admitting: Internal Medicine

## 2019-12-23 ENCOUNTER — Telehealth (INDEPENDENT_AMBULATORY_CARE_PROVIDER_SITE_OTHER): Payer: Self-pay

## 2019-12-23 ENCOUNTER — Encounter: Payer: Self-pay | Admitting: Internal Medicine

## 2019-12-23 ENCOUNTER — Other Ambulatory Visit: Payer: Self-pay | Admitting: Internal Medicine

## 2019-12-23 ENCOUNTER — Other Ambulatory Visit: Payer: Self-pay

## 2019-12-23 DIAGNOSIS — I10 Essential (primary) hypertension: Secondary | ICD-10-CM

## 2019-12-23 DIAGNOSIS — E2749 Other adrenocortical insufficiency: Secondary | ICD-10-CM

## 2019-12-23 MED ORDER — AMLODIPINE BESYLATE 5 MG PO TABS
2.5000 mg | ORAL_TABLET | Freq: Every day | ORAL | 1 refills | Status: DC
Start: 2019-12-23 — End: 2020-02-05

## 2019-12-23 MED ORDER — HYDROCORTISONE 20 MG PO TABS
20.0000 mg | ORAL_TABLET | Freq: Every day | ORAL | 1 refills | Status: DC
Start: 2019-12-23 — End: 2020-04-14

## 2019-12-23 NOTE — Progress Notes (Signed)
[facility] Pharmacist called- clarified with MD then clarified with pharmacist that patient instructions on hydrocortisone 20 MG are take 20 MG daily. No tapering. 20 MG is patient's maintenance dose.

## 2019-12-23 NOTE — Telephone Encounter (Addendum)
Dr. [name] called the triage line this afternoon with stated "disautonomia" c/o dizziness and nausea when switching from a lying to a standing position.  The pt's lying BP is 160/95 and standing 130/70 with a pretty consistent HR 60-70 when lying, sitting and standing.  The pt denies any active cardiac,syncopal or near syncopal symptoms. The pt will concentrate on changing positions slowly and maintaining adequate hydration and I let him know that I would reach out to [facility] AOD regarding his orthostatic BP's and would get back to him with further recommendations.  The pt's medlist has been updated to reflect his current medication regimen.  1806: I reached out to the pt this evening to discuss recommendations per [facility] AOD regarding his c/o orthostatic BP symptoms when the pt stands up.  I explained that per [facility], he would like the pt to decrease his Amlodipine from 5mg  to 2.5mg  po qd, make sure he is wearing his compression stockings and to increase his hydration and to have him schedule an APP OV next available for further evaluation of his symptoms. I have updatd the pt's medlist to reflect the decrease in his Amlodipine and le the pt know that I would reach out to him tomorrow morning to help facilitate getting his first available APP OV scheduled.  The pt states an understanding of these recommendations and thanked me for the f/u phone call.

## 2019-12-24 ENCOUNTER — Telehealth (INDEPENDENT_AMBULATORY_CARE_PROVIDER_SITE_OTHER): Payer: Self-pay

## 2019-12-24 NOTE — Telephone Encounter (Signed)
1048: I reached out to the pt this am to check in to see how he is feeling with the changes to his medication per [facility] yesterday. The pt says that he is not notice a change in his dizziness when he stands up and is still observing a drop in his SBP by 30 mmhg when moving to a standing position.  The pt is now wearing his TEDS stockings he used to wear in the operating room, increasing his hydration and Na intake in addition to decreasing his Amlodipine from 5mg  po bid to 2.5mg  qd as advised per [facility]. The pt admits to taking Amlodipine 2.5mg  bid yesterday since his PCP discontinued his Ramipril 20mg  last Friday.  We discussed decreasing his Amlodipine to 2.5mg  qd as advised yesterday in addition to the common sense orthostatic precautions he has initiated since he has not had a meaningful change in his symptoms.  I reached out to [facility] @ the [facility] to help facilitate scheduling for a first available APP OV.  The pt was offered and confirmed an APP OV with [facility] on Friday @ 1pm in the [place].  In the meantime, I assured the Dr. [name] that I would send a message to [facility] and would get back to him with any further recommendations. The pt's medlist was updated to reflect the changes in medication therapy.

## 2019-12-26 ENCOUNTER — Encounter (INDEPENDENT_AMBULATORY_CARE_PROVIDER_SITE_OTHER): Payer: Self-pay | Admitting: Adult Health

## 2019-12-26 DIAGNOSIS — G629 Polyneuropathy, unspecified: Secondary | ICD-10-CM

## 2019-12-26 HISTORY — DX: Polyneuropathy, unspecified: G62.9

## 2019-12-27 ENCOUNTER — Ambulatory Visit (INDEPENDENT_AMBULATORY_CARE_PROVIDER_SITE_OTHER): Payer: Medicare Other | Admitting: Adult Health

## 2019-12-27 ENCOUNTER — Encounter: Payer: Self-pay | Admitting: Internal Medicine

## 2019-12-27 ENCOUNTER — Encounter (INDEPENDENT_AMBULATORY_CARE_PROVIDER_SITE_OTHER): Payer: Self-pay | Admitting: Adult Health

## 2019-12-27 VITALS — BP 150/94 | HR 85 | Wt 177.2 lb

## 2019-12-27 DIAGNOSIS — I951 Orthostatic hypotension: Secondary | ICD-10-CM

## 2019-12-27 DIAGNOSIS — I7090 Unspecified atherosclerosis: Secondary | ICD-10-CM

## 2019-12-27 DIAGNOSIS — I10 Essential (primary) hypertension: Secondary | ICD-10-CM

## 2019-12-27 NOTE — Progress Notes (Signed)
[facility] OFFICE PROGRESS NOTE    [facility] HEART [facility] OFFICE -CARDIOLOGY  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  Medical Record #: [id number]  Requesting Physician: Modesto Charon, MD      CHIEF COMPLAINT: Hypotension (orthostatic)      HISTORY OF PRESENT ILLNESS:    He is a 69 y.o. male who presents today for followup of orthostatic dizziness.  This has been a relatively chronic problem for him and is likely due to dysautonomia.  He called into the office a couple of times recently planing of 30 mmHg drops in his blood pressure upon standing.  He gets lightheaded but there is no presyncope or syncope.  Symptoms last a few minutes and do not occur every time he stands.  His ramipril 20 mg was just discontinued and his amlodipine was cut back from 10 mg to 5 mg daily.  Over the past few days his blood pressures have been in the 140s-160s range supine and continue to drop 20-30 mmHg upon standing.  He continues to be mildly lightheaded from time to time but this has been a little better in the past couple of days.  Heart rate consistently is in the 60s-70s supine and 70s-80s standing.  His wife states the patient lies flat in bed for much of the day due to his other medical conditions.  He has been suffering from periods of significant nausea and fatigue which have really affected his quality of life.  He is being followed by several specialists and has been seen at [facility] and [facility] in the past.    Blood pressure in the office today was initially quite elevated at 200/120.  Upon my recheck it was 142/92.  Orthostatic blood pressure was 162/98 supine and 150/94 standing.  He stood several times during his exam and did not get lightheaded.  He is concerned about allowing his blood  pressure to be too high in light of his mildly dilated ascending aorta.  He has been wearing compression stockings which he feels has been helpful.      PAST MEDICAL HISTORY: He has a past medical history of Ascending aortic aneurysm (09/19/2018), Avascular necrosis (2008), Bilateral cataracts, Brain tumor (2003), Calculus of kidney, Cold intolerance, Color blindness, Disorder of prostate, DJD (degenerative joint disease), Dry eyes, Eczema (03/29/2016), Encounter for blood transfusion (2004), Encounter for hepatitis C screening test for low risk patient (08/2016), Encounter for pharmacogenetic testing (10/2015), Fibromyalgia, Gastroesophageal reflux disease, Hemorrhoids, Hypertension, Hypothyroidism, Lateral epicondylitis, Malignant neoplasm of skin, Mitral valve insufficiency, Osteoporosis, Peripheral neuropathy (12/26/2019), Screening PSA (prostate specific antigen) (02/14/2018), Sleep apnea, and Tinnitus. He has a past surgical history that includes EXCISION, LESION (12/03/2008); Colonoscopy (2008); Tonsillectomy and adenoidectomy (as a child); Orchiopexy (Left, 1980); Subtotal resection of posterior sagittal meningioma (12/05/2002); Tooth extraction (Right, 06/2018); and EGD, COLONOSCOPY (N/A, 04/16/2019).    ALLERGIES:   Allergies   Allergen Reactions   . Bextra [Valdecoxib] Rash       MEDICATIONS:   Current Outpatient Medications   Medication Sig   . docusate sodium (Colace) 100 MG capsule Take 100 mg by mouth 2 (two) times daily   . ibuprofen (ADVIL) 600 MG tablet Take 600 mg by mouth as  needed   . melatonin 10 mg tablet Take 10 mg by mouth nightly   . pantoprazole (PROTONIX) 40 MG tablet Take 40 mg by mouth daily   . psyllium (Metamucil) 0.52 g capsule Take 1.6 g by mouth 2 (two) times daily   . senna (SENOKOT) 8.6 MG tablet Take 17.2 mg by mouth 2 (two) times daily   . albuterol (PROAIR HFA) 108 (90 Base) MCG/ACT inhaler INHALE ONE PUFF BY MOUTH EVERY FOUR TO SIX HOURS AS NEEDED FOR WHEEZING (Patient taking  differently: 2 puffs 2 (two) times daily INHALE ONE PUFF BY MOUTH EVERY FOUR TO SIX HOURS AS NEEDED FOR WHEEZING  )   . amitriptyline (ELAVIL) 10 MG tablet Take 40 mg by mouth nightly     . amLODIPine (NORVASC) 5 MG tablet Take 0.5 tablets (2.5 mg total) by mouth daily (Patient taking differently: Take 2.5 mg by mouth 2 (two) times daily   )   . BELBUCA 450 MCG Film 2 (two) times daily      . Calcium Carbonate-Vitamin D (CALCIUM + D PO) Take by mouth. 500/400mg   daily    . carvedilol (COREG) 6.25 MG tablet TAKE TWO TABLETS BY MOUTH TWICE DAILY WITH FOOD   . clonazePAM (KLONOPIN) 0.5 MG tablet Take 0.5 mg by mouth daily as needed      . cyanocobalamin 1000 MCG tablet every 24 hours   . ezetimibe (ZETIA) 10 MG tablet Take 1 tablet (10 mg total) by mouth daily   . finasteride (PROSCAR) 5 MG tablet Take 1 tablet (5 mg total) by mouth nightly   . hydrocortisone (CORTEF) 20 MG tablet Take 1 tablet (20 mg total) by mouth daily Take 4 PO TID F3   . levothyroxine (SYNTHROID) 150 MCG tablet TAKE ONE TABLET BY MOUTH ONE TIME DAILY AT 6 PM   . modafinil (PROVIGIL) 200 MG tablet Take 200 mg by mouth daily     . montelukast (SINGULAIR) 10 MG tablet Take one tablet by mouth at bedtime as directed   . Multiple Vitamins-Minerals (CENTRUM SILVER PO) Take 1 tablet by mouth daily.     . ondansetron (ZOFRAN-ODT) 4 MG disintegrating tablet 4 mg as needed   . prochlorperazine (COMPAZINE) 10 MG tablet Take 5 mg by mouth as needed      . promethazine (PHENERGAN) 25 MG suppository as needed   . pyridoxine (B-6) 100 MG tablet Take 100 mg by mouth daily.   . simvastatin (ZOCOR) 20 MG tablet Take 1 tablet (20 mg total) by mouth nightly   . SYMBICORT 80-4.5 MCG/ACT inhaler Inhale 2 puffs into the lungs two times daily (Patient taking differently: 2 puffs 2 (two) times daily Have patient rinse mouth after use  )   . tamsulosin (FLOMAX) 0.4 MG Cap Take 1 capsule (0.4 mg total) by mouth Daily after dinner   . testosterone (ANDROGEL) 25 MG/2.5GM  (1%) Gel 2.5 mg as needed Has urination issues when using     . VITAMIN D, CHOLECALCIFEROL, PO Take 5,000 Units by mouth daily.      [name] zolpidem (AMBIEN) 10 MG tablet Take 1 tablet (10 mg total) by mouth nightly as needed for Sleep        FAMILY HISTORY: family history includes Asthma in his daughter; Dementia in his mother; Migraines in his sister; Tuberculosis in his mother.    SOCIAL HISTORY: He reports that he has never smoked. He has never used smokeless tobacco. He reports current drug use. He reports that he does  not drink alcohol.    PHYSICAL EXAMINATION    Visit Vitals  BP (!) 150/94   Pulse 85   Wt 80.4 kg (177 lb 3.2 oz)   SpO2 95%   BMI 26.17 kg/m       Constitutional:  Alert, cooperative, no acute distress  Integumentary:  Warm and dry to touch  Head:  Normocephalic  Neck:  Carotid pulses full and equal bilaterally, no bruits, no JVD  Chest:  Clear to auscultation bilaterally, no use of accessory muscles, normal respiratory effort  Cardiac:  Regular rate and rhythm, S1 and S2 normal, no S3 or S4, without murmurs  Abdomen:  Soft, non-tender, non-distended  Peripheral Pulses:  2+ radial pulses bilaterally  Extremities and Back:  No edema present, compression stockings bilaterally  Neurologic:  Affect appropriate  Psychiatric:  Normal memory      ECG: Sinus rhythm with heart rate 81 bpm.    LABS:   No results found for: CBC  Lab Results   Component Value Date    AST 29 11/27/2019    ALT 42 11/27/2019     No results found for: LIPID  Lab Results   Component Value Date    HGBA1C 6.2 (H) 11/27/2019    TSH 0.155 (L) 11/27/2019         Most recent echo and nuclear study reviewed.      IMPRESSION:   Mr. Wiltsey is a 69 y.o. male with the following problems:    1. Orthostatic hypotension associated with mild lightheadedness, somewhat better with recent discontinuation of ramipril and reduction in the dose of amlodipine although probably still too early to really assess this.  2. Longstanding mild aortic valve  regurgitation with mildly dilated ascending aorta, stable by most recent echocardiogram in January 2020.  3. Hypertension.  4. Hyperlipidemia, on statin and Zetia.  Most recent LDL 76 in 2019.  5. Meningioma, status post partial resection in 2004.  6. Panhypopituitarism.  7. Hypothyroidism.  8. Peripheral neuropathy.  9. Cognitive issues.  10. Fibromyalgia chronic fatigue, quite debilitating.      RECOMMENDATIONS:    1. Continue his current antihypertensive regimen given recent adjustments to his doses.  This includes Coreg 12.5 mg twice daily and amlodipine 2.5 mg twice daily.  2. He will contact our office via MyChart early next week with his orthostatic blood pressure readings.  Ideally would like his average blood pressures to be less than 130/80 given his mildly dilated ascending aorta, although we may need to tolerate higher than ideal blood pressures if he continues to have symptomatic orthostatis.  3. Would favor a different beta-blocker that does not have alpha blockade and thus changing Coreg to metoprolol would be a reasonable next step.  4. Avoid diuretics.  5. Reinforced lifestyle measures for his orthostatic hypotension, including daytime compression stockings (he was given an order for prescription grade stockings), avoiding prolonged periods of lying flat, keeping the head of his bed at 30 degrees, cautious positional changes, and adequate hydration.  6. Continue his cholesterol medications.  7. Close follow-up with all of his specialists and PCP.  8. Eventual 3-week in office follow-up with an APP, although we will be in contact with him before that.  9. Discussed indications for when to contact our office sooner.  Orders Placed This Encounter   Procedures   . ECG 12 lead (Normal)       No orders of the defined types were placed in this encounter.      SIGNED:    Lyn Hollingshead, NP          This note was generated by the Dragon speech recognition  and may contain errors or omissions not intended by the user. Grammatical errors, random word insertions, deletions, pronoun errors, and incomplete sentences are occasional consequences of this technology due to software limitations. Not all errors are caught or corrected. If there are questions or concerns about the content of this note or information contained within the body of this dictation, they should be addressed directly with the author for clarification.

## 2019-12-31 ENCOUNTER — Encounter (INDEPENDENT_AMBULATORY_CARE_PROVIDER_SITE_OTHER): Payer: Self-pay | Admitting: Cardiovascular Disease

## 2019-12-31 ENCOUNTER — Encounter: Payer: Self-pay | Admitting: Endocrinology, Diabetes and Metabolism

## 2019-12-31 ENCOUNTER — Encounter (INDEPENDENT_AMBULATORY_CARE_PROVIDER_SITE_OTHER): Payer: Self-pay | Admitting: Rheumatology

## 2019-12-31 ENCOUNTER — Ambulatory Visit (INDEPENDENT_AMBULATORY_CARE_PROVIDER_SITE_OTHER): Payer: Medicare Other | Admitting: Endocrinology, Diabetes and Metabolism

## 2019-12-31 VITALS — BP 163/81 | HR 97 | Temp 98.3°F | Ht 70.0 in | Wt 179.4 lb

## 2019-12-31 DIAGNOSIS — E291 Testicular hypofunction: Secondary | ICD-10-CM

## 2019-12-31 NOTE — Progress Notes (Signed)
Spoke with pt.  He is having orthostasis. Will reduce coreg to 6.35mg  bid and he will schedule in person OV in 1 month

## 2020-01-01 ENCOUNTER — Ambulatory Visit (INDEPENDENT_AMBULATORY_CARE_PROVIDER_SITE_OTHER): Payer: Medicare Other | Admitting: Rheumatology

## 2020-01-01 NOTE — Progress Notes (Signed)
Faxed [facility] order for STIM test to [facility] per Dr. Tamera Punt. Fx# [phone].

## 2020-01-02 ENCOUNTER — Encounter: Payer: Self-pay | Admitting: Endocrinology, Diabetes and Metabolism

## 2020-01-03 ENCOUNTER — Encounter: Payer: Self-pay | Admitting: Endocrinology, Diabetes and Metabolism

## 2020-01-10 ENCOUNTER — Ambulatory Visit (INDEPENDENT_AMBULATORY_CARE_PROVIDER_SITE_OTHER): Payer: Medicare Other | Admitting: Rheumatology

## 2020-01-10 MED ORDER — COSYNTROPIN 0.25 MG IJ SOLR
0.25 mg | Freq: Once | INTRAMUSCULAR | Status: AC
Start: 2020-01-14 — End: 2020-01-14
  Administered 2020-01-14: 0.25 mg via INTRAVENOUS
  Filled 2020-01-10: qty 0.25

## 2020-01-14 ENCOUNTER — Ambulatory Visit: Payer: Medicare Other | Attending: Endocrinology, Diabetes and Metabolism

## 2020-01-14 DIAGNOSIS — E2740 Unspecified adrenocortical insufficiency: Secondary | ICD-10-CM | POA: Insufficient documentation

## 2020-01-14 DIAGNOSIS — E291 Testicular hypofunction: Secondary | ICD-10-CM | POA: Insufficient documentation

## 2020-01-14 LAB — CORTROSYN STIMULATION TEST
Cortisol 30 Minutes: 5.3 ug/dL
Cortisol 60 Minutes: 6.5 ug/dL
Cortisol Base: 1.8 ug/dL

## 2020-01-14 NOTE — Progress Notes (Signed)
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 22G PIV stated in pts right AC. IV flushed easily with excellent blood return.    Baseline labs drawn at 0840 and centrifuged for 5 mins.    Cortrosyn injection 0.25mg  reconstituted with 1ml of normal saline given at 0856 via IV push to pt tolerance.      Blood drawn again at 0927 and lab centrifuged for 5 mins.     Blood drawn a final time at 0957 and centrifuged for 5 mins.     Right AC PIV removed with catheter intact and IV site covered with a gauze and co-band. Pt released in stable condition by himself.     See MAR, VS, Doc Flowsheets for more details.  Pt tolerated the testing procedure well without problems.    Time: 1015 , pt discharged stable, NAD.    Laurey Morale, RN   01/14/2020

## 2020-01-16 ENCOUNTER — Encounter (INDEPENDENT_AMBULATORY_CARE_PROVIDER_SITE_OTHER): Payer: Self-pay | Admitting: Nurse Practitioner

## 2020-01-16 ENCOUNTER — Telehealth (INDEPENDENT_AMBULATORY_CARE_PROVIDER_SITE_OTHER): Payer: Self-pay

## 2020-01-16 NOTE — Telephone Encounter (Signed)
I am fine with him holding the carvedilol altogether.

## 2020-01-16 NOTE — Telephone Encounter (Signed)
1720: I followed-up with the pt this afternoon to let him know that KKT is ok with him holding his carvedilol altogether.  The pt is happy to hear that KKT is ok with him holding the Carvedilol for now but wanted to know if he should be increasing his other BP medication since his SBP's run high.   I explained that for now KKT did not suggest any further adjustment to his other BP medication. I also discussed JDNP's recommendation to have the pt come in for an OV due to his labile BP to discuss poc moving forward. The patient agrees that he needs to schedule an ov before his next appointment with MMNP at the end of June to discuss a poc moving forward.  The patient was offered and confirmed a TVAV tomorrow morning via FACETIME with PRN tomorrow @ [phone].  The pt will try to upload a copy of his BP log into epic for his review.  I also let the pt know that I would send a message to the [facility] clinical staff to ask that the hard copy of the pt's bp log in KKT's provider folder could be given to PRN for this TVAV.  The pt thanked me for the f/u phone call.

## 2020-01-16 NOTE — Telephone Encounter (Signed)
1242: The pt called the triage line this am to let KKT know that the symptoms of nausea and lightheadedness is improving with the decreased dose of Carvedilol but he would like to discontinue the Carvedilol completely if he is amenable.  He explained that he dropped his home BP log at the [facility] yesterday wanting to also confirm that KKT has received and reviewed his home BP log.  I reached out to the FOO to verify receipt of his log.  PRRN confirmed receipt of the BP log letting me know that the log is in KKT's provider folder for his review when he is back in the office next week.  I assured the pt that I would send a message to KKT to let him know that he called but in the meantime I have reached out to JDNP AOD for her recommendations regarding the pt's Carvedilol and will get back to him.  The pt is amenable to this plan and thanked me for my time.

## 2020-01-17 ENCOUNTER — Encounter (INDEPENDENT_AMBULATORY_CARE_PROVIDER_SITE_OTHER): Payer: Self-pay | Admitting: Cardiology

## 2020-01-17 ENCOUNTER — Telehealth (INDEPENDENT_AMBULATORY_CARE_PROVIDER_SITE_OTHER): Payer: Medicare Other | Admitting: Cardiology

## 2020-01-17 VITALS — BP 163/86 | HR 75 | Ht 69.0 in | Wt 175.0 lb

## 2020-01-17 DIAGNOSIS — I10 Essential (primary) hypertension: Secondary | ICD-10-CM

## 2020-01-17 MED ORDER — METOPROLOL SUCCINATE ER 25 MG PO TB24
25.00 mg | ORAL_TABLET | Freq: Every day | ORAL | 3 refills | Status: DC
Start: 2020-01-17 — End: 2020-02-05

## 2020-01-17 NOTE — Progress Notes (Signed)
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  Medical Record #: [id number]  Requesting Physician: Modesto Charon, MD      CHIEF COMPLAINT:  Hyperlipidemia and Hypertension      HISTORY OF PRESENT ILLNESS:  The visit today was conducted via telemedicine due to COVID-19 precautions.  The patient was in their home and was informed and gave verbal consent to proceed.    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    We had a FaceTime telehealth visit together today.  I reviewed the chart carefully.  I also reviewed our old electronic health record notes.  He has had a very difficult existence since retirement.  He tells me that he is often nauseated and dizzy.  Thankfully he has not had syncope or falls.  His pedal edema is under control.  He wears knee-high stockings.  He just weaned off his carvedilol.  We talked about metoprolol, beta-1 selective beta-blocker and he is willing to be on that since his blood pressures have fluctuated but mostly been high.  The highest blood pressure recorded was 200/100 a couple of weeks ago and he is often 120s over 80s.  This morning standing he was 128/89 and supine he was 163/86.      PAST MEDICAL HISTORY: He has a past medical history of Ascending aortic aneurysm (09/19/2018), Avascular necrosis (2008), Bilateral cataracts, Brain tumor (2003), Calculus of kidney, Cold intolerance, Color blindness, Disorder of prostate, DJD (degenerative joint disease), Dry eyes, Eczema (03/29/2016), Encounter for blood transfusion (2004), Encounter for hepatitis C screening test for low risk patient  (08/2016), Encounter for pharmacogenetic testing (10/2015), Fibromyalgia, Gastroesophageal reflux disease, Hemorrhoids, Hypertension, Hypothyroidism, Lateral epicondylitis, Malignant neoplasm of skin, Mitral valve insufficiency, Osteoporosis, Peripheral neuropathy (12/26/2019), Screening PSA (prostate specific antigen) (02/14/2018), Sleep apnea, and Tinnitus. He has a past surgical history that includes EXCISION, LESION (12/03/2008); Colonoscopy (2008); Tonsillectomy and adenoidectomy (as a child); Orchiopexy (Left, 1980); Subtotal resection of posterior sagittal meningioma (12/05/2002); Tooth extraction (Right, 06/2018); and EGD, COLONOSCOPY (N/A, 04/16/2019).    ALLERGIES:   Allergies   Allergen Reactions   . Bextra [Valdecoxib] Rash       MEDICATIONS:   Current Outpatient Medications   Medication Sig   . albuterol (PROAIR HFA) 108 (90 Base) MCG/ACT inhaler INHALE ONE PUFF BY MOUTH EVERY FOUR TO SIX HOURS AS NEEDED FOR WHEEZING (Patient taking differently: 2 puffs 2 (two) times daily INHALE ONE PUFF BY MOUTH EVERY FOUR TO SIX HOURS AS NEEDED FOR WHEEZING  )   . AMITRIPTYLINE HCL PO Take 20 mg by mouth nightly      . amLODIPine (NORVASC) 5 MG tablet Take 0.5 tablets (2.5 mg total) by mouth daily (Patient taking differently: Take 2.5 mg by mouth 2 (two) times daily   )   . BELBUCA 450 MCG Film 2 (two) times daily      . Calcium Carbonate-Vitamin D (CALCIUM + D PO) Take by mouth. 500/400mg   daily    . carvedilol (COREG) 6.25 MG tablet TAKE TWO TABLETS BY  MOUTH TWICE DAILY WITH FOOD   . clonazePAM (KLONOPIN) 0.5 MG tablet Take 0.5 mg by mouth daily as needed      . cyanocobalamin 1000 MCG tablet every 24 hours   . docusate sodium (Colace) 100 MG capsule Take 100 mg by mouth 2 (two) times daily   . ezetimibe (ZETIA) 10 MG tablet Take 1 tablet (10 mg total) by mouth daily   . finasteride (PROSCAR) 5 MG tablet Take 1 tablet (5 mg total) by mouth nightly   . hydrocortisone (CORTEF) 20 MG tablet Take 1 tablet (20 mg total) by  mouth daily Take 4 PO TID F3   . levothyroxine (SYNTHROID) 150 MCG tablet TAKE ONE TABLET BY MOUTH ONE TIME DAILY AT 6 PM   . melatonin 10 mg tablet Take 10 mg by mouth nightly   . modafinil (PROVIGIL) 200 MG tablet Take 200 mg by mouth daily     . montelukast (SINGULAIR) 10 MG tablet Take one tablet by mouth at bedtime as directed   . Multiple Vitamins-Minerals (CENTRUM SILVER PO) Take 1 tablet by mouth daily.     . ondansetron (ZOFRAN-ODT) 4 MG disintegrating tablet 4 mg as needed   . pantoprazole (PROTONIX) 40 MG tablet Take 40 mg by mouth daily   . prochlorperazine (COMPAZINE) 10 MG tablet Take 5 mg by mouth as needed      . promethazine (PHENERGAN) 25 MG suppository as needed   . psyllium (Metamucil) 0.52 g capsule Take 1.6 g by mouth 2 (two) times daily   . pyridoxine (B-6) 100 MG tablet Take 100 mg by mouth daily.   . ramipril (ALTACE) 10 MG capsule Take 20 mg by mouth daily   . senna (SENOKOT) 8.6 MG tablet Take 17.2 mg by mouth 2 (two) times daily   . simvastatin (ZOCOR) 20 MG tablet Take 1 tablet (20 mg total) by mouth nightly   . SYMBICORT 80-4.5 MCG/ACT inhaler Inhale 2 puffs into the lungs two times daily (Patient taking differently: 2 puffs 2 (two) times daily Have patient rinse mouth after use  )   . tamsulosin (FLOMAX) 0.4 MG Cap Take 1 capsule (0.4 mg total) by mouth Daily after dinner   . VITAMIN D, CHOLECALCIFEROL, PO Take 5,000 Units by mouth daily.      [name] zolpidem (AMBIEN) 10 MG tablet Take 1 tablet (10 mg total) by mouth nightly as needed for Sleep   . metoprolol succinate XL (TOPROL-XL) 25 MG 24 hr tablet Take 1 tablet (25 mg total) by mouth daily        FAMILY HISTORY: family history includes Asthma in his daughter; Dementia in his mother; Migraines in his sister; Tuberculosis in his mother.    SOCIAL HISTORY: He reports that he has never smoked. He has never used smokeless tobacco. He reports current drug use. He reports that he does not drink alcohol.    PHYSICAL EXAMINATION    Vital Signs:    Visit Vitals  BP 163/86   Pulse 75   Ht 1.753 m (5\' 9" )   Wt 79.4 kg (175 lb)   BMI 25.84 kg/m       Constitutional: Cooperative, alert and oriented, well developed, well nourished, in no acute distress.   Head: normocephalic      Eyes: conjunctivae and lids unremarkable  ENT: No pallor or cyanosis   Psychiatric:  normal memory  Neurological: No gross motor deficits noted, affect appropriate, oriented to time, person and place.      LABS:  No results found for: CBC  Lab Results   Component Value Date    AST 29 11/27/2019    ALT 42 11/27/2019     No results found for: LIPID  Lab Results   Component Value Date    HGBA1C 6.2 (H) 11/27/2019    TSH 0.155 (L) 11/27/2019       Echocardiogram from January 2020 was reviewed.    Impressions:    1.  Orthostasis, complex and related probably to his underlying fibromyalgia and probable dysautonomia.  I explained that salt loading despite his hypertension may be useful to him and that 1 out of 5 patients is sodium sensitive with their hypertension.  If his hypertension worsens he can of course back off.  I told him that thigh-high compression stockings are much more effective than anything knee-high and he will try those.  He already has a prescription from Danford Bad, NP.  He is going to insist on the thigh-high when he has them fitted.  We also discussed restarting a beta-blocker but a beta 1 selective beta-blocker such as metoprolol succinate and he agrees.    2.  Hypertensive heart disease on echo 09/10/2018 he has LVH and mild aortic root dilatation.    3.  Dyslipidemia-simvastatin use.    4.  Partial resection of meningioma in 2004.    5.  Panhypopituitarism-on replacement therapy.    6.  Peripheral neuropathy and some cognitive issues.    7.  Fibromyalgia and chronic fatigue syndrome.    Recommendations:    1.  Thigh-high compression stockings 20 to 30 mm graded    2.  Begin metoprolol succinate 25 mg daily.  We may need to increase that to twice daily ultimately.     3.  Loosen salt restriction.  He will even attempt careful salt loading.    4.  He has a follow-up visit in a couple of weeks with the APP.  The physicians are of course happy to be involved in his care as well with a visit in 6 months with either Dr. Brent Bulla or me.          This note was generated by the Dragon speech recognition and may contain errors or omissions not intended by the user. Grammatical errors, random word insertions, deletions, pronoun errors, and incomplete sentences are occasional consequences of this technology due to software limitations. Not all errors are caught or corrected. If there are questions or concerns about the content of this note or information contained within the body of this dictation, they should be addressed directly with the author for clarification.    Orders Placed This Encounter   Medications   . metoprolol succinate XL (TOPROL-XL) 25 MG 24 hr tablet     Sig: Take 1 tablet (25 mg total) by mouth daily     Dispense:  90 tablet     Refill:  3       SIGNED:    Harvel Quale, MD

## 2020-01-21 ENCOUNTER — Ambulatory Visit (INDEPENDENT_AMBULATORY_CARE_PROVIDER_SITE_OTHER): Payer: Medicare Other | Admitting: Rheumatology

## 2020-01-21 ENCOUNTER — Encounter: Payer: Self-pay | Admitting: Internal Medicine

## 2020-01-21 ENCOUNTER — Encounter (INDEPENDENT_AMBULATORY_CARE_PROVIDER_SITE_OTHER): Payer: Self-pay | Admitting: Rheumatology

## 2020-01-21 VITALS — BP 138/82 | HR 74 | Temp 97.8°F | Resp 16 | Ht 69.0 in | Wt 178.2 lb

## 2020-01-21 DIAGNOSIS — G901 Familial dysautonomia [Riley-Day]: Secondary | ICD-10-CM

## 2020-01-21 DIAGNOSIS — M797 Fibromyalgia: Secondary | ICD-10-CM

## 2020-01-21 NOTE — Progress Notes (Signed)
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 Chronic fatigue fibromyalgia syndrome     Dx Dr. Pollyann Savoy in 03/2013   . dysautonomia     Dx 2 wks ago, Cardiologist Dr. Tami Ribas        HPI:  Long history of Fibromyalgia. Last  Rheum evaluation 2014. Previously seen at [facility] and [address] Symptoms started after 2004 resection of meningioma. He has used all major classes of medications used for FMS from the Rheum perspective to no avail. Currently getting a buccal opioid from Pain Mgmt specialist.  Recently dx with Dysautonomia from Cardiology due to low BP first noticed by his gastroenterologist.    The following sections were reviewed this encounter by the provider:   Tobacco  Allergies  Meds  Problems  Med Hx  Surg Hx  Fam Hx         PMH/PSH:  Past Medical History:   Diagnosis Date   . Ascending aortic aneurysm 09/19/2018    4 cm ECHO   . Avascular necrosis 2008    R hip - did not require surgery   . Bilateral cataracts     budding   . Brain tumor 2003    Taken out in 2003, has returned    . Calculus of kidney    . Cold intolerance    . Color blindness    . Disorder of prostate     BPH   . DJD (degenerative joint disease)     Lumbar / Sacral   . Dry eyes     uses restasis eyedrops which helps (RF, CCP, CRP and ESR have been wnl in past)   . Dysautonomia    . Eczema 03/29/2016    Right thumb    . Encounter for blood transfusion 2004   . Encounter for hepatitis C screening test for low risk patient 08/2016    negative   . Encounter for pharmacogenetic testing 10/2015    MEDIMAP   . Fibromyalgia    . Gastroesophageal reflux disease    . Hemorrhoids    . Hypertension    . Hypothyroidism    . Lateral epicondylitis     Left   . Malignant neoplasm of skin    . Mitral valve insufficiency    . Osteoporosis    . Peripheral neuropathy  12/26/2019   . Screening PSA (prostate specific antigen) 02/14/2018    0.4   . Sleep apnea     mild, no cpap   . Tinnitus     high pitched buzzing comes and goes - started after craniotomy for meningioma        Past Surgical History:   Procedure Laterality Date   . COLONOSCOPY  2008   . EGD, COLONOSCOPY N/A 04/16/2019    Procedure: EGD, COLONOSCOPY;  Surgeon: Leatha Gilding, MD;  Location: [facility];  Service: Gastroenterology;  Laterality: N/A;  egd, colonoscopy  q1-unk, md req 45 min   . EXCISION, LESION  12/03/2008    benign lesion   . ORCHIOPEXY Left 1980    for intermittent torsion   . Subtotal resection of posterior sagittal meningioma  12/05/2002    Partial brain resection secondary to meningioma at [facility]   . TONSILLECTOMY AND ADENOIDECTOMY  as a  child   . TOOTH EXTRACTION Right 06/2018        FH/SH:  Family History   Problem Relation Age of Onset   . Asthma Daughter    . Tuberculosis Mother    . Dementia Mother    . Migraines Sister        Social History     Socioeconomic History   . Marital status: Married     Spouse name: Not on file   . Number of children: Not on file   . Years of education: Not on file   . Highest education level: Not on file   Occupational History   . Not on file   Tobacco Use   . Smoking status: Never Smoker   . Smokeless tobacco: Never Used   Substance and Sexual Activity   . Alcohol use: No     Comment: rare   . Drug use: Yes     Comment: uses MJ for pain daily   . Sexual activity: Not on file   Other Topics Concern   . Not on file   Social History Narrative    He lives in [place] and is from [place]. Married - wife Angelique Blonder. Two daughters (one lives in [place] and one in [place]). Retired Web designer - retired in 2016.     Social Determinants of Health     Financial Resource Strain:    . Difficulty of Paying Living Expenses:    Food Insecurity:    . Worried About Programme researcher, broadcasting/film/video in the Last Year:    . Barista in the Last Year:    Transportation Needs:    . Automotive engineer (Medical):    [name] Lack of Transportation (Non-Medical):    Physical Activity:    . Days of Exercise per Week:    . Minutes of Exercise per Session:    Stress:    . Feeling of Stress :    Social Connections:    . Frequency of Communication with Friends and Family:    . Frequency of Social Gatherings with Friends and Family:    . Attends Religious Services:    . Active Member of Clubs or Organizations:    . Attends Banker Meetings:    [name] Marital Status:    Intimate Partner Violence:    . Fear of Current or Ex-Partner:    . Emotionally Abused:    [name] Physically Abused:    . Sexually Abused:         Meds/ Allergies:  Outpatient Medications Marked as Taking for the 01/21/20 encounter (Office Visit) with Lawrence Marseilles, MD   Medication Sig Dispense Refill   . albuterol (PROAIR HFA) 108 (90 Base) MCG/ACT inhaler INHALE ONE PUFF BY MOUTH EVERY FOUR TO SIX HOURS AS NEEDED FOR WHEEZING (Patient taking differently: 2 puffs 2 (two) times daily INHALE ONE PUFF BY MOUTH EVERY FOUR TO SIX HOURS AS NEEDED FOR WHEEZING  ) 25.5 g 1   . AMITRIPTYLINE HCL PO Take 20 mg by mouth nightly        . amLODIPine (NORVASC) 5 MG tablet Take 0.5 tablets (2.5 mg total) by mouth daily (Patient taking differently: Take 2.5 mg by mouth 2 (two) times daily   ) 180 tablet 1   . BELBUCA 450 MCG Film 2 (two) times daily        . Calcium Carbonate-Vitamin D (CALCIUM + D PO) Take by mouth. 500/400mg   daily      .  clonazePAM (KLONOPIN) 0.5 MG tablet Take 0.5 mg by mouth daily as needed     2   . cyanocobalamin 1000 MCG tablet every 24 hours     . docusate sodium (Colace) 100 MG capsule Take 100 mg by mouth 2 (two) times daily     . ezetimibe (ZETIA) 10 MG tablet Take 1 tablet (10 mg total) by mouth daily 90 tablet 3   . finasteride (PROSCAR) 5 MG tablet Take 1 tablet (5 mg total) by mouth nightly 90 tablet 3   . hydrocortisone (CORTEF) 20 MG tablet Take 1 tablet (20 mg total) by mouth daily Take 4 PO TID F3 90 tablet 1   .  levothyroxine (SYNTHROID) 150 MCG tablet TAKE ONE TABLET BY MOUTH ONE TIME DAILY AT 6 PM 90 tablet 0   . melatonin 10 mg tablet Take 10 mg by mouth nightly     . metoprolol succinate XL (TOPROL-XL) 25 MG 24 hr tablet Take 1 tablet (25 mg total) by mouth daily 90 tablet 3   . modafinil (PROVIGIL) 200 MG tablet Take 200 mg by mouth daily PRN       . montelukast (SINGULAIR) 10 MG tablet Take one tablet by mouth at bedtime as directed 90 tablet 1   . Multiple Vitamins-Minerals (CENTRUM SILVER PO) Take 1 tablet by mouth daily.       . ondansetron (ZOFRAN-ODT) 4 MG disintegrating tablet 4 mg as needed     . pantoprazole (PROTONIX) 40 MG tablet Take 40 mg by mouth daily     . prochlorperazine (COMPAZINE) 10 MG tablet Take 5 mg by mouth as needed        . promethazine (PHENERGAN) 25 MG suppository as needed     . psyllium (Metamucil) 0.52 g capsule Take 1.6 g by mouth 2 (two) times daily     . pyridoxine (B-6) 100 MG tablet Take 100 mg by mouth daily.     . ramipril (ALTACE) 10 MG capsule Take 20 mg by mouth daily     . senna (SENOKOT) 8.6 MG tablet Take 17.2 mg by mouth 2 (two) times daily     . simvastatin (ZOCOR) 20 MG tablet Take 1 tablet (20 mg total) by mouth nightly 90 tablet 1   . SYMBICORT 80-4.5 MCG/ACT inhaler Inhale 2 puffs into the lungs two times daily (Patient taking differently: 2 puffs 2 (two) times daily Have patient rinse mouth after use  ) 10.2 g 3   . tamsulosin (FLOMAX) 0.4 MG Cap Take 1 capsule (0.4 mg total) by mouth Daily after dinner 90 capsule 0   . VITAMIN D, CHOLECALCIFEROL, PO Take 5,000 Units by mouth daily.        [name] zolpidem (AMBIEN) 10 MG tablet Take 1 tablet (10 mg total) by mouth nightly as needed for Sleep 20 tablet 0     Allergies   Allergen Reactions   . Bextra [Valdecoxib] Rash       Review of Systems   Constitutional: Positive for malaise/fatigue.   Gastrointestinal: Positive for nausea.   Musculoskeletal: Positive for joint pain.   Psychiatric/Behavioral: Positive for memory loss.         PHYSICAL EXAM  Vitals:    01/21/20 0932   BP: 138/82   Pulse: 74   Resp:    Temp:         General- WNWD, alert and oriented, NAD    Eyes- EOMI, PERRL, no conjunctival injection, no scleral icterus    Normal Mood,  congruent affect  Data reviewed  Lab Results   Component Value Date    WBC 12.8 (H) 11/27/2019    HGB 14.9 11/27/2019    HCT 43.7 11/27/2019    MCV 90 11/27/2019    PLT 243 11/27/2019      Lab Results   Component Value Date    CREAT 0.91 11/27/2019    BUN 23 11/27/2019    NA 140 11/27/2019    K 4.3 11/27/2019    CL 103 11/27/2019    CO2 23 11/27/2019      Lab Results   Component Value Date    ALT 42 11/27/2019    AST 29 11/27/2019    GGT 125 (H) 01/12/2007    ALKPHOS 70 11/27/2019    BILITOTAL 0.4 11/27/2019      Lab Results   Component Value Date    ESR 3 12/20/2016      Lab Results   Component Value Date    CRP <0.3 03/29/2016      Lab Results   Component Value Date    ANA Negative 09/06/2018      No results found for: RHEUMFACTOR   Lab Results   Component Value Date    URICACID 5.9 12/20/2016       RADIOLOGY:    I have reviewed the patient's latest radiology results.  Brain MRI April 2021  Stable posterior parafalcine meningioma with continued  segmental occlusion of the superior sagittal sinus.  ASSESSMENT/PLAN:    1. Fibromyalgia    2. Dysautonomia     FMS - has not responded to medical therapies.  No new options have come to clinical care since FDA approval of duloxetine, pregabalin, milnacripran for these symptoms  Dysautonomia - commonly associated with FMS and other forms of central sensitivity state. Not known to be a cause and effect relationship.    Fibromyalgia Syndrome (FMS or fibromyalgia) is an idiopathic chronic pain syndrome with manifestations of musculoskeletal pain resulting from central nervous system (CNS) sensitization to pain and other stimuli. Current evidence suggests Fibromyalgia is a CNS disorder with alteration in pain processing areas perpetuated by involvement of  learning and memory circuits. This is not an inflammatory or autoimmune disorder.    Adapted from the [employer] of Rheumatology:  Fibromyalgia is not a form of arthritis (joint disease). It does not cause inflammation or damage to joints, muscles or other tissues. However, because fibromyalgia can cause chronic pain and fatigue similar to arthritis, patients with these symptoms may be advised to see a rheumatologist. Rheumatologists often detect this condition (and rule out autoimmune or inflammatory rheumatic diseases). For long term care, follow-up with a rheumatologist is not needed; rather primary care providers can provide all the other care and treatment of fibromyalgia patients may need.    Treatment of FMS is centered on patient self-care in the following areas: regular aerobic exercise, improving sleep, and mind-body techniques such as cognitive behavioral therapy which is available on-line through the [employer] [employer] [place] ([email or web address])    Several classes of medications are often used to help lessen pain as discussed in Clauw DJ JAMA. 2014;311(15):1547-1555, an excellent reference on treatment of FMS in the primary care setting. Patients often have persistent symptoms once FMS has been diagnosed. Mortality is not increased in FMS.  Long term adherence to medical therapies has recently been shown to be quite low, usually because of side effects and/or inefficacy. See http://fernandez-robinson.info/    Pt provided with in depth explanation of the condition,  Transport planner, and opportunity to ask questions.      Return if symptoms worsen or fail to improve.      PATIENT INSTRUCTIONS:  Patient Instructions   Fibromyalgia Syndrome (FMS or fibromyalgia) is a chronic pain syndrome with symptoms of musculoskeletal pain resulting from central nervous system (CNS) sensitization to pain and other stimuli. Widespread body pain is the most common symptom. Other  symptoms include fatigue, poor sleep, headaches, and irritable bowels. Current evidence suggests Fibromyalgia is a CNS disorder with alteration in pain processing that may last a long time because of involvement of learning and memory parts of the brain. Fibromyalgia symptoms are not due to damage or inflammation of tissues    Comments from the [employer] of Rheumatology (ACR)   -Fibromyalgia affects two - four percent of people, women more often than men.  -Fibromyalgia is not an autoimmune or inflammation based illness, but research suggests the nervous system is involved.  -Doctors diagnose fibromyalgia based on all the patient's relevant symptoms (what you feel), no longer just on the number of tender places during an examination.  -There is no test to detect this disease, but you may need lab tests or X-rays to rule out other health problems.  -Though there is no cure, patients may take charge of their symptoms with targeted  self-care including: low impact exercise, weight loss, mindfulness practices, and getting enough sleep. Recent studies suggest tai-chi may be more helpful than aerobic exercise in Fibromyalgia.  - Medications sometimes can help with symptoms in some patients but do not eliminate pain completely. Also, effectiveness may diminish over time and side effects may occur.    The role of the rheumatologist according to the ACR:  Fibromyalgia is not a form of arthritis (joint disease). It does not cause inflammation or damage to joints, muscles or other tissues. However, because fibromyalgia can cause chronic pain and fatigue similar to arthritis, some people may advise you to see a rheumatologist. As a result, often a rheumatologist detects this disease (and rules out rheumatic diseases). For long term care, you do not need to follow with a rheumatologist. Your primary care physician can provide all the other care and treatment of fibromyalgia that you need.    For more information on  Fibromyalgia visit:  [email or web address]://[email or web address].org/I-Am-A/Patient-Caregiver/Diseases-Conditions/[email or web address]    For resources on patient-centered techniques to help with symptoms:  EmbassyBlog.es    For information on Tai Chi visit:  [email or web address]  Or  [email or web address]-[email or web address].[email or web address]             Marnee Guarneri, MD, FACP, [facility] Rheumatologist  [address]  [place], [place] [address]  P [phone]  F [phone]

## 2020-01-21 NOTE — Patient Instructions (Signed)
Fibromyalgia Syndrome (FMS or fibromyalgia) is a chronic pain syndrome with symptoms of musculoskeletal pain resulting from central nervous system (CNS) sensitization to pain and other stimuli. Widespread body pain is the most common symptom. Other symptoms include fatigue, poor sleep, headaches, and irritable bowels. Current evidence suggests Fibromyalgia is a CNS disorder with alteration in pain processing that may last a long time because of involvement of learning and memory parts of the brain. Fibromyalgia symptoms are not due to damage or inflammation of tissues    Comments from the American College of Rheumatology (ACR)   -Fibromyalgia affects two - four percent of people, women more often than men.  -Fibromyalgia is not an autoimmune or inflammation based illness, but research suggests the nervous system is involved.  -Doctors diagnose fibromyalgia based on all the patient's relevant symptoms (what you feel), no longer just on the number of tender places during an examination.  -There is no test to detect this disease, but you may need lab tests or X-rays to rule out other health problems.  -Though there is no cure, patients may take charge of their symptoms with targeted  self-care including: low impact exercise, weight loss, mindfulness practices, and getting enough sleep. Recent studies suggest tai-chi may be more helpful than aerobic exercise in Fibromyalgia.  - Medications sometimes can help with symptoms in some patients but do not eliminate pain completely. Also, effectiveness may diminish over time and side effects may occur.    The role of the rheumatologist according to the ACR:  Fibromyalgia is not a form of arthritis (joint disease). It does not cause inflammation or damage to joints, muscles or other tissues. However, because fibromyalgia can cause chronic pain and fatigue similar to arthritis, some people may advise you to see a rheumatologist. As a result, often a rheumatologist detects this  disease (and rules out rheumatic diseases). For long term care, you do not need to follow with a rheumatologist. Your primary care physician can provide all the other care and treatment of fibromyalgia that you need.    For more information on Fibromyalgia visit:  [email or web address]://[email or web address]/I-Am-A/Patient-Caregiver/Diseases-Conditions/[email or web address]    For resources on patient-centered techniques to help with symptoms:  https://fibroguide.med.[employer].edu/fibroguide.html    For information on Tai Chi visit:  https://taichiforhealthinstitute.org/programs/tai-chi-[email or web address]-arthritis/  Or  https://www.arthritis.org/living-with-arthritis/exercise/workouts/other-activities/tai-chi-arthritis.php

## 2020-01-22 ENCOUNTER — Encounter (INDEPENDENT_AMBULATORY_CARE_PROVIDER_SITE_OTHER): Payer: Self-pay

## 2020-01-30 ENCOUNTER — Encounter: Payer: Self-pay | Admitting: Endocrinology, Diabetes and Metabolism

## 2020-01-30 ENCOUNTER — Encounter (INDEPENDENT_AMBULATORY_CARE_PROVIDER_SITE_OTHER): Payer: Self-pay | Admitting: Cardiology

## 2020-01-31 ENCOUNTER — Encounter: Payer: Self-pay | Admitting: Endocrinology, Diabetes and Metabolism

## 2020-02-03 ENCOUNTER — Other Ambulatory Visit: Payer: Self-pay | Admitting: Endocrinology, Diabetes and Metabolism

## 2020-02-03 ENCOUNTER — Other Ambulatory Visit: Payer: Self-pay | Admitting: Internal Medicine

## 2020-02-03 DIAGNOSIS — N401 Enlarged prostate with lower urinary tract symptoms: Secondary | ICD-10-CM

## 2020-02-03 DIAGNOSIS — R3912 Poor urinary stream: Secondary | ICD-10-CM

## 2020-02-03 DIAGNOSIS — R350 Frequency of micturition: Secondary | ICD-10-CM

## 2020-02-03 MED ORDER — TAMSULOSIN HCL 0.4 MG PO CAPS
0.40 mg | ORAL_CAPSULE | Freq: Every day | ORAL | 0 refills | Status: DC
Start: 2020-02-03 — End: 2020-03-30

## 2020-02-03 MED ORDER — FINASTERIDE 5 MG PO TABS
5.0000 mg | ORAL_TABLET | Freq: Every evening | ORAL | 3 refills | Status: DC
Start: 2020-02-03 — End: 2020-07-30

## 2020-02-03 MED ORDER — RAMIPRIL 10 MG PO CAPS
20.00 mg | ORAL_CAPSULE | Freq: Every day | ORAL | 1 refills | Status: DC
Start: 2020-02-03 — End: 2020-05-14

## 2020-02-03 NOTE — Telephone Encounter (Signed)
Patient would like a refill of ramipril (ALTACE) 10 MG capsule called in to the pharmacy on file.

## 2020-02-04 NOTE — Progress Notes (Addendum)
[place] CARDI[facility] OFFICE PROGRESS NOTE    [facility]RT [facility] HEART [facility] OFFICE -CARDIOLOGY  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  Medical Record #: [id number]  Requesting Physician: Modesto Charon, MD      CHIEF COMPLAINT: Dizziness      HISTORY OF PRESENT ILLNESS:    He is a pleasant 69 y.o. male who presents today for follow-up of orthostatic hypotension on the background of hypertension with severe spikes in his blood pressure.  He saw Dr. Tami Ribas for an urgent visit on May 28.  He had just discontinued his Coreg due to symptomatic orthostasis.  He was having elevated blood pressures and subsequently started on metoprolol 25mg  daily.  Dr. Tami Ribas also recommended thigh-high compression stockings and increasing his dietary salt intake.    He has been doing much better on the metoprolol.  He reports the nausea is significantly better as well as the dizziness.  He self increased his amlodipine to 5 mg twice daily about 2 weeks ago.  He says this is really helped his blood pressures.  He checks his blood pressure twice per day, once in the morning when standing and then immediately thereafter lying down.  He has been doing it this way for some time.  The standing blood pressures have been ranging from 110s-130s/80s-90s in supine 140s-150s/80s-90s.  Heart rates have ranged from the 60s to 90s.  He purchased thigh-high compression stockings and they have also been very helpful.      PAST MEDICAL HISTORY: He has a past medical history of Aortic regurgitation, Ascending aortic aneurysm (09/19/2018), Avascular necrosis (2008), Bilateral cataracts, Brain tumor (2003), Calculus of kidney, Cold intolerance, Color blindness, Disorder of prostate, DJD (degenerative joint disease), Dry eyes, Dysautonomia, Eczema (03/29/2016),  Encounter for blood transfusion (2004), Encounter for hepatitis C screening test for low risk patient (08/2016), Encounter for pharmacogenetic testing (10/2015), Fibromyalgia, Gastroesophageal reflux disease, Hemorrhoids, Hyperlipidemia, Hypertension, Hypothyroidism, Lateral epicondylitis, Malignant neoplasm of skin, Mitral valve insufficiency, Osteoporosis, Peripheral neuropathy (12/26/2019), Screening PSA (prostate specific antigen) (02/14/2018), Sleep apnea, and Tinnitus. He has a past surgical history that includes EXCISION, LESION (12/03/2008); Colonoscopy (2008); Tonsillectomy and adenoidectomy (as a child); Orchiopexy (Left, 1980); Subtotal resection of posterior sagittal meningioma (12/05/2002); Tooth extraction (Right, 06/2018); EGD, COLONOSCOPY (N/A, 04/16/2019); ECHOCARDIOGRAM, TRANSTHORACIC (04/29/2005); and ECHOCARDIOGRAM, TRANSTHORACIC (10/17/2016).    ALLERGIES:   Allergies   Allergen Reactions   . Bextra [Valdecoxib] Rash       MEDICATIONS:   Current Outpatient Medications   Medication Sig   . albuterol (PROAIR HFA) 108 (90 Base) MCG/ACT inhaler INHALE ONE PUFF BY MOUTH EVERY FOUR TO SIX HOURS AS NEEDED FOR WHEEZING (Patient taking differently: 2 puffs 2 (two) times daily INHALE ONE PUFF BY MOUTH EVERY FOUR TO SIX HOURS AS NEEDED FOR WHEEZING  )   . AMITRIPTYLINE HCL PO Take 20 mg by mouth nightly      . amLODIPine (NORVASC) 5 MG tablet Take 1 tablet (5 mg total) by mouth 2 (two) times daily   . BELBUCA 450 MCG Film 2 (two) times daily      . Calcium Carbonate-Vitamin D (CALCIUM + D PO) Take by mouth. 500/400mg   daily    . clonazePAM (KLONOPIN) 0.5 MG tablet Take 0.5 mg  by mouth daily as needed      . cyanocobalamin 1000 MCG tablet every 24 hours   . docusate sodium (Colace) 100 MG capsule Take 100 mg by mouth 2 (two) times daily   . ezetimibe (ZETIA) 10 MG tablet Take 1 tablet (10 mg total) by mouth daily   . finasteride (PROSCAR) 5 MG tablet Take 1 tablet (5 mg total) by mouth nightly   .  hydrocortisone (CORTEF) 20 MG tablet Take 1 tablet (20 mg total) by mouth daily Take 4 PO TID F3   . levothyroxine (SYNTHROID) 150 MCG tablet TAKE ONE TABLET BY MOUTH ONE TIME DAILY AT 6 PM   . melatonin 10 mg tablet Take 10 mg by mouth nightly   . modafinil (PROVIGIL) 200 MG tablet Take 200 mg by mouth daily PRN     . montelukast (SINGULAIR) 10 MG tablet Take one tablet by mouth at bedtime as directed   . Multiple Vitamins-Minerals (CENTRUM SILVER PO) Take 1 tablet by mouth daily.     . ondansetron (ZOFRAN-ODT) 4 MG disintegrating tablet 4 mg as needed   . pantoprazole (PROTONIX) 40 MG tablet Take 40 mg by mouth daily   . prochlorperazine (COMPAZINE) 10 MG tablet Take 5 mg by mouth as needed      . promethazine (PHENERGAN) 25 MG suppository as needed   . psyllium (Metamucil) 0.52 g capsule Take 1.6 g by mouth 2 (two) times daily   . ramipril (ALTACE) 10 MG capsule Take 2 capsules (20 mg total) by mouth daily   . senna (SENOKOT) 8.6 MG tablet Take 17.2 mg by mouth 2 (two) times daily   . simvastatin (ZOCOR) 20 MG tablet Take 1 tablet (20 mg total) by mouth nightly   . SYMBICORT 80-4.5 MCG/ACT inhaler Inhale 2 puffs into the lungs two times daily (Patient taking differently: 2 puffs 2 (two) times daily Have patient rinse mouth after use  )   . tamsulosin (FLOMAX) 0.4 MG Cap Take 1 capsule (0.4 mg total) by mouth Daily after dinner   . VITAMIN D, CHOLECALCIFEROL, PO Take 5,000 Units by mouth daily.      [name] zolpidem (AMBIEN) 10 MG tablet Take 1 tablet (10 mg total) by mouth nightly as needed for Sleep   . metoprolol succinate XL (Toprol XL) 50 MG 24 hr tablet Take 1 tablet (50 mg total) by mouth daily        FAMILY HISTORY: family history includes Asthma in his daughter; Dementia in his mother; Migraines in his sister; Tuberculosis in his mother.    SOCIAL HISTORY: He reports that he has never smoked. He has never used smokeless tobacco. He reports current drug use. He reports that he does not drink alcohol.    PHYSICAL  EXAMINATION    Visit Vitals  BP 142/88 (BP Site: Left arm, Patient Position: Standing)   Pulse 90   Ht 1.753 m (5\' 9" )   Wt 79.4 kg (175 lb)   BMI 25.84 kg/m       Constitutional:  Alert, cooperative, no acute distress  Integumentary:  Warm and dry to touch  Head:  Normocephalic  Neck:  No JVD  Chest:  Clear to auscultation bilaterally, no use of accessory muscles, normal respiratory effort  Cardiac:  Regular rate and rhythm, S1 and S2 normal, no S3 or S4, without murmurs  Peripheral Pulses:  2+ radial pulses bilaterally  Extremities and Back:  No edema present, wearing compression stockings bilaterally  Neurologic:  Affect appropriate  Psychiatric:  Normal memory      LABS:   No results found for: CBC  Lab Results   Component Value Date    AST 29 11/27/2019    ALT 42 11/27/2019     No results found for: LIPID  Lab Results   Component Value Date    HGBA1C 6.2 (H) 11/27/2019    TSH 0.155 (L) 11/27/2019         Most recent echo and nuclear study reviewed.      IMPRESSION:   Mr. Hyams is a 69 y.o. male with the following problems:    1. Complex symptomatic orthostatic hypotension, likely related to his fibromyalgia and probable dysautonomia.  Several recent changes to his antihypertensive regimen with subsequent improvement both in his blood pressures and symptoms.  2. Hypertension, reasonably controlled on current regimen although still with high blood pressure spikes.  3. Longstanding mild aortic valve regurgitation with a mildly dilated ascending aorta, stable by most recent echocardiogram in January 2020.  4. Hyperlipidemia, on statin and Zetia.  Most recent LDL 76 in 2019.  5. Prior partial meningioma resection in 2004.  6. Panhypopituitarism.  7. Hypothyroidism.  8. Peripheral neuropathy.  9. Cognitive issues.  10. Fibromyalgia and chronic fatigue syndrome, which are quite debilitating.      RECOMMENDATIONS:    1. Increase Toprol to 50 mg daily.  He will take this at bedtime.  2. Continue the remainder of his  cardiac medications, including the higher dose of amlodipine.  3. Continue use of thigh-high compression stockings.  These have helped significantly.  4. Continue monitoring blood pressure at home.  He prefers to continue checking his readings first standing and then supine.  5. Telehealth follow-up with an APP in 3 weeks, sooner as needed.                                                     Orders Placed This Encounter   Procedures   . APP Office Visit (HRT [place])       Orders Placed This Encounter   Medications   . metoprolol succinate XL (Toprol XL) 50 MG 24 hr tablet     Sig: Take 1 tablet (50 mg total) by mouth daily     Dispense:  90 tablet     Refill:  1   . amLODIPine (NORVASC) 5 MG tablet     Sig: Take 1 tablet (5 mg total) by mouth 2 (two) times daily     Dispense:  60 tablet     Refill:  0       SIGNED:    Lyn Hollingshead, NP          This note was generated by the Dragon speech recognition and may contain errors or omissions not intended by the user. Grammatical errors, random word insertions, deletions, pronoun errors, and incomplete sentences are occasional consequences of this technology due to software limitations. Not all errors are caught or corrected. If there are questions or concerns about the content of this note or information contained within the body of this dictation, they should be addressed directly with the author for clarification.

## 2020-02-05 ENCOUNTER — Encounter (HOSPITAL_BASED_OUTPATIENT_CLINIC_OR_DEPARTMENT_OTHER): Payer: Self-pay | Admitting: Endocrinology, Diabetes and Metabolism

## 2020-02-05 ENCOUNTER — Ambulatory Visit (INDEPENDENT_AMBULATORY_CARE_PROVIDER_SITE_OTHER): Payer: Medicare Other | Admitting: Adult Health

## 2020-02-05 ENCOUNTER — Encounter (INDEPENDENT_AMBULATORY_CARE_PROVIDER_SITE_OTHER): Payer: Self-pay

## 2020-02-05 ENCOUNTER — Encounter (INDEPENDENT_AMBULATORY_CARE_PROVIDER_SITE_OTHER): Payer: Self-pay | Admitting: Adult Health

## 2020-02-05 VITALS — BP 142/88 | HR 90 | Ht 69.0 in | Wt 175.0 lb

## 2020-02-05 DIAGNOSIS — E782 Mixed hyperlipidemia: Secondary | ICD-10-CM

## 2020-02-05 DIAGNOSIS — I951 Orthostatic hypotension: Secondary | ICD-10-CM

## 2020-02-05 DIAGNOSIS — I10 Essential (primary) hypertension: Secondary | ICD-10-CM

## 2020-02-05 DIAGNOSIS — I351 Nonrheumatic aortic (valve) insufficiency: Secondary | ICD-10-CM

## 2020-02-05 MED ORDER — METOPROLOL SUCCINATE ER 50 MG PO TB24
50.00 mg | ORAL_TABLET | Freq: Every day | ORAL | 1 refills | Status: DC
Start: 2020-02-05 — End: 2020-03-16

## 2020-02-05 MED ORDER — AMLODIPINE BESYLATE 5 MG PO TABS
5.0000 mg | ORAL_TABLET | Freq: Two times a day (BID) | ORAL | 0 refills | Status: DC
Start: 2020-02-05 — End: 2020-03-16

## 2020-02-05 NOTE — Addendum Note (Signed)
Addended by: Lyn Hollingshead. on: 02/05/2020 02:12 PM     Modules accepted: Orders

## 2020-02-06 ENCOUNTER — Other Ambulatory Visit (HOSPITAL_BASED_OUTPATIENT_CLINIC_OR_DEPARTMENT_OTHER): Payer: Self-pay | Admitting: Endocrinology, Diabetes and Metabolism

## 2020-02-06 ENCOUNTER — Encounter (HOSPITAL_BASED_OUTPATIENT_CLINIC_OR_DEPARTMENT_OTHER): Payer: Self-pay

## 2020-02-06 MED ORDER — TESTOSTERONE 20.25 MG/ACT (1.62%) TD GEL
TRANSDERMAL | 2 refills | Status: DC
Start: 2020-02-06 — End: 2020-02-13

## 2020-02-06 NOTE — Progress Notes (Signed)
Faxed prescription for Testosterone 20.25 MG/ACT (1.62%) Gel; # 300 (Three-hundred) grams with 2RFS to [facility] ([facility]) per Dr. Ousman. Fx# [phone]. 12:21 pm.

## 2020-02-13 ENCOUNTER — Other Ambulatory Visit (HOSPITAL_BASED_OUTPATIENT_CLINIC_OR_DEPARTMENT_OTHER): Payer: Self-pay | Admitting: Endocrinology, Diabetes and Metabolism

## 2020-02-13 ENCOUNTER — Encounter (HOSPITAL_BASED_OUTPATIENT_CLINIC_OR_DEPARTMENT_OTHER): Payer: Self-pay | Admitting: Endocrinology, Diabetes and Metabolism

## 2020-02-14 ENCOUNTER — Encounter (HOSPITAL_BASED_OUTPATIENT_CLINIC_OR_DEPARTMENT_OTHER): Payer: Self-pay

## 2020-02-14 MED ORDER — TESTOSTERONE 20.25 MG/ACT (1.62%) TD GEL
TRANSDERMAL | 2 refills | Status: DC
Start: 2020-02-14 — End: 2021-01-06

## 2020-02-14 NOTE — Progress Notes (Signed)
Faxed prescription for Testosterone 20.25 MG/ACT (1.62%) Gel; # 300 (Three-hundred) grams with 2RFS to [facility]) per Dr. Tamera Punt. Fx# [phone]. 12:21 pm.

## 2020-02-17 ENCOUNTER — Other Ambulatory Visit: Payer: Self-pay | Admitting: Internal Medicine

## 2020-02-17 MED ORDER — SIMVASTATIN 20 MG PO TABS
20.0000 mg | ORAL_TABLET | Freq: Every evening | ORAL | 1 refills | Status: DC
Start: 2020-02-17 — End: 2020-08-05

## 2020-02-17 NOTE — Progress Notes (Signed)
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 I saw him once back in February 16109. He was then referred to [place] by his PCP, Dr. Gloriajean Dell from [facility], for evaluation of hypopituitarism presumably secondary to brain surgery for meningioma. I reviewed the notes, imaging reports and labs in EPIC. He is accompanied by his spouse.   He was also seen in the past at the endocrine department at the [facility] clinic.    In the past, he has been on quite high doses of hydrocortisone (HC) at time (300+ mg daily) to treat symptoms of nausea, extreme fatigue, and dizziness. He was able to taper the dose of HC and wants to know if it can be discontinued. He is current taking a base dose of 20 mg in am.  He uses higher doses if he expereinces the above symptoms. In that past that approach helped but no much recently.      He is also using topical testosterone for hypogonadism.      He also takes levothyroxine for hypothyroidism.  Fatigue and dizziness have bene the dominant symptoms.    Active Problems        Patient Active Problem List    Diagnosis Date Noted   . History of meningioma of the brain 11/09/2015     Priority: High     12/09/02: s/p R parieto-occipital parasagittal craniotomy for 85-90% resection of R pareito-occipital falx meningioma that originally measured 61 x 24 mm    - 10/27/16: eval w [name] (Neurology - [facility]): agree w [name] that lesion is stable and that calcification should be reassuring . I recommended he remain under imaging surveillance. I have told him that it is very unlikely that there is a dysautonomia to account for his sx when upright, but I personally like to be sure that we have no evidence for that. He will be seen in Sleep Medicine and I endorse that as well.   - 07/04/16: eval w [name] (Neurosurgery -[facility]): stable both clinically and on imaging w regard to his now calcified residual  parasagittal meningioma. ... Now that it is calcified, I am not convinced there is any change between Jan and Oct of this year. I think continued observation would be very reasonable. Recommend FU MRI in one year.   - eval w [name], Neurosurgery [facility] - rec FU 1 year  - MRI Brain w/wo contrast 06/07/16: Again seen is a L posterior parafalcine meningioma. There is a slightly greater measurement in the cephalocaudal plane which may be related to technique or minimal tumor progression. Progressive opacification of the L mastoid air cells. Otherwise stable exam  - 10/13/16: letter from [name]: Neurosurgery at [facility]: "CT scan does show dents of calcification.Marland KitchenMarland KitchenAs we discussed in clinic I think it is unlikely that ongoing sx are related to meningioma .[name] I would continue to follow w MRI scan in one year.   - 10/07/15: eval w [name], Neurosurgery at [facility]: "number of nonspecific sx. I am not certain that they are related to any enlargement of his residual meningioma and certainly would not expect them to be improved by any treatment of his residual meningioma. With that in minds I think the question to establish is whether or not this residual is in fact enlarging. ... I suspect that this lesion is stable and in fact calcified. If that were the case, observation  with another MRI scan in a year may be very reasonable. Alternatively we could consider either gamma knife radiosurgery or craniotomy and resection. The lesion is at the UL for size for what we could treat w gamma knife, but I think it could be accomplished in one or perhaps two staged procedures. However I would only recommend this if the lesion was clearly enlarging and I am not certain of this at the present time. Recommend CT scan of head to confirm that lesion is calcified. I will be in touch with him after this is available.   - 10/06/15: eval w Neurology [name] [facility]): I have told Dr. Lacretia Nicks that I really cannot give him any advise  until I review outside pathology and prior films .[name] Pt requested appt in Fibromyalgia/Chronic fatigue clinic as well as Endo.   - 09/16/15 eval w [name], Neurosurgery [facility]: I do not believe any of the sx he is having now are related to the tumor. By report he did not have papilledema in [name] exam. I recommended however that he undergo CyberKnife radiosurgery. Pt will call my office to set up RadOnc consult and will proceed w CyberKnife tx.      . Chronic fatigue fibromyalgia syndrome 01/24/2013     Priority: High     In Aug 2006 hd noted spontaneous onset of severe fatigue and myalgias. He had extensive evaluation for CT and autoimmune disease. Ultimately he was discovered to have inadequate thyroid replacement as well as a low free testosterone. He was evaluated by endocrine at that time. ... Ultimately he was diagnosed with fibromyalgia and chronic fatigue syndrome for which he was prescribed various medications including provigil, cymbalta, gabapentin and pregabalin, and elavil (all w/o improvement). \    - Pt email 07/11/17: I have an appointment to see Dr. Penni Bombard on Wednesday, November 21st @1 :30.  - 06/22/17: Today the patient is mainly here to talk about his chronic pain. He carries a diagnosis from previous providers of chronic pain and fibromyalgia syndrome. He has been extensively evaluated in the past for this and other conditions by the [facility]. He has another annual examination with [facility] coming up in the next 5 months. He notes that lately he has had more difficulty with fatigue, generalized muscle pain and "brain fog." He has submitted routine blood work to the [facility] to monitor his adrenal insufficiency and hypothyroidism and these are reportedly stable. He feels like sometimes he feels "terrible" and has difficulty leaving the house due to pain and fatigue. He has tried a wide variety of medications in the past to help with fibromyalgia without  specific improvement. These are summarized below. 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 Currently he takes tramadol 25-50 mg once or twice daily and this helps but does not entirely alleviate his pain. Pain is worse in the morning, improves somewhat through the day. Fatigue is always present. He was recommended by his psychiatrist and neurologist at [facility] to try Remeron and donepezil. He did not tolerate these. He wonders about additional treatment options. Currently he feels like insomnia is better but fatigue and pain are worse. Despite his extensive workup with many specialists in the past at [facility] he wonders if there is still some occult cause of his symptoms which has not been identified.  - 10/08/15: note from  [name] [facility] Fibromyalgia and Chronic Fatigue): presentation is c/w fibromyalgia and chronic fatigue. Advised to complete Fibro/CFS treatment program  - 10/06/15 Note from Neurology Dr. Raynelle Fanning [facility]: In Aug 2006 noted spontaneous onset of severe fatigue and myalgias. He had extensive evaluation for CT and autoimmune disease. Ultimately he was discovered to have inadequate thyroid replacement as well as low free testosterone. He was eval by endocrine ... Ultimately he was diagnosed with fibromyalgia and chronic fatigue syndrome for which he was prescribed various medications including provigil, cymbalta, gabapentin and pregabalin.      [name] History of insomnia 01/17/2017     Priority: Medium     - 10/31/16: eval w Endocrine Dr. Tawanna Cooler [place] [facility]): I believe this is a major contributor - we did have him do overnight oximetry with using CPAP and this did show continued desaturations that were very long periods of time when he was up and not able to fall asleep - he is seeing the sleep center      . Secondary adrenal insufficiency 01/17/2017     Priority:  Medium     - 11/23/16: eval w Endocrine Dr. Tawanna Cooler [place] [facility]): due for reassessment of FT4 and IGF 1 in 4 weeks after changing dose on 10/27/16. Mail in specimen. Pursue recs for insomnia from psych. MRI did not show signs of dementia. PFT nl. FU 6-12 mos  - 10/24/16: eval w Endocrine Dr. Tawanna Cooler [place] [facility]): despite adequate replacement w levothyroxine, GH and hydrocortisone he still has significant fatigue, although there has been a definite benefit from the hormone treatment. Levothyroxine dose is a little high given the slightly low TSH and this is not uncommon to see replacement dose requirements decrease for thyroid replacement when [facility] is initiated. We are waiting on reports on IGF1 and testosterone.      . Mood disorder secondary to multiple medical problems 01/17/2017     Priority: Medium     - 12/01/16 eval w psychiatry at [facility] Dr. James Ivanoff: "complicated situation. Recommend trying remeron 15 mg to help w sleep, mood and anxiety. ... If his sleep can be improved that could potentally improve his cognition. Try it for at least 6-8 weeks. He can let me know how he is doing via patient portal. I also recommended that he try to simplify his medication and situations, meaning not using klonopin or percocet. I cautioned him on marijuana use. Recommend continuing w cognitive behavioral therapy and psychotherapy techniques.      . Amnestic MCI (mild cognitive impairment with memory loss) 11/20/2016     Priority: Medium     - had dizziness on donepezil and remeron - [place] both - as of 01/18/17 - on half tab of donepezil 10 mg  - 11/24/16: eval w Neurology Dr. Betsey Holiday [facility]): He does have insomnia, and remeron just commenced which makes good sense. He may also benefit from cholinergic stimulation and therefore donepezil may be helpful here. .. We discussed potential role of amyloid PET testing. I will arrange an amyvid PET scan. If negative, early AD is effectively ruled out. Consider brain rehab  program. Meet back after PET testing.   - 10/31/16: eval w Endocrine Dr. Eyvonne Mechanic [facility]): Dr. Earney Hamburg completed neuropsychologic testing and incidates he has very focal impairment in verbal memory and to a lesser extent in visual memory and indicates this is consistent with the amnestic variety of mild cognitive impairment. Neurology is arranging for further testing with MRI for cognitive dysfunction and  brain PET scan.   - 10/28/16 eval w Dr Azucena Kuba [facility] Neuropsych): impaired performance in several measures of verbal learning and memory and some marginally poor performances in measures of nonverbal learning and memory. Marland KitchenMarland KitchenMarland KitchenThese results are significant and while neuropsychological testing in and of itself cannot diagnose any particular condition you would potentially benefit from further discussion, possibly with our colleagues in Neurology to determine if there is some underlying condition that may be contributing to these memory difficulties even beyond your history of brain tumor.      . Growth hormone deficiency 03/29/2016     Priority: Medium     - 10/31/16: eval w Endocrine Dr. [place]awanna [place]er [place] [facility]): increase GH to 0.4 mg daily  - 08/26/16: eval w Dr. Eyvonne Mechanic [facility] Endocrine): At this point suspect that tsome of these issues will improve the longer he is on [facility] .Marland KitchenMarland Kitchen Therefore I am not concerned with him taking 7.5 mg to 10 mg of prednisone a day or its equivalent if that is what he needs in the next several months.   - 07/11/16: eval w Dr. Tawanna Cooler [place] [facility] Endocrine): he did have very + response to hydrocortisone replacement .[name] However he would lose effect after 4 hours. .. We have talked about options and could try prednisone with longer half life - swtch to t mg in AM and 1 mg between 3-5 pm and adjust as needed. He has been on finasteride for many years and give trial off of this. New rx for androgel packets. Thyroid function is excellent.   - 12/10/15: eval w Dr. Tawanna Cooler [place] [facility]  Endocrine): no response to [facility] after glucagon stimulation. He does have some sx that could be explained by partial adrenal insufficiency ... I would like to proceed with a trial of glucocorticoid replacement now prior to starting [facility] to see if he has the typical response we would see w cortisol replacement. ... If his s do not improve w hydrocortisone in replacement doses this can be . I think however we still would need to follow this given the unclear nature of the underlying pituitary process and the possibility that deficits may develop as time goes on.   - 10/08/15: note from [name] [facility] Fibromyalgia and Chronic Fatigue): presentation is c/w fibromyalgia and chronic fatigue. Advised to complete Fibro/CFS treatment program  - 10/07/15: eval w Dr. Eyvonne Mechanic [facility] Endocrine): Await testosterone levels and advise on adjustments ... We do not need to worry about adrenal insufficiency. However he has multiple sx which are c/w growth hormone deficiency and I think this is worth defining. I will do glucagon stimulation test - if this confirms GH deficiency I would strongly recommend replacement which hopefully will improve at least some of his sx particularly a component of his fatigue, his social isolation, his decreased ambition and altered body composition. I will see him after testing is complete.      . Essential hypertension 01/24/2013     Priority: Medium     - eval w Dr. Ova Freshwater (Nephor/HTN at [facility]): BP seems better, monitor home readings - not having significant autonomic dysfunction  -ECHO 10/27/16 [facility]: nl LV size and systolic function w EF of 65% - mild AV regurgitation and mild ascending aortic dilatation - recommend repeat ECHO in 12 months  - 10/26/16: eval w Dr. Ova Freshwater (Nephro/HTN at [facility]): recommend change amlodipine to 10 mg from 5 mg and take at night. Continue ramipril 20 mg. I would hesitate  to start clonidine given dry eyes and dry mouth. I would hesitate to  put him on diuretic given the adrenal insufficiency. I would therefore lead toward putting him on something like low dose alpha blocker like carvedilol. ... He is agreeable to try this. We will follow up next month.   - 10/25/16: eval w Dr. Allen Norris (Cardiology): mild AV regurge based on outside echo, repeat echo. Borderline mid ascending aortic dilatation. Recommend ECHO in 12 months for FU. Lipid profile appears appropriate     . Mixed hyperlipidemia 01/24/2013     Priority: Medium     TC 189 TRI 112 HDL 81 LDL 86 09/14/16 -     Pt reports he has statin for many years (was taken off to see if this would help with fibromyalgia pain wo improvement) and zetia and tricor     . Primary hypothyroidism 01/24/2013     Priority: Medium     - reports TSH in Oct 2018 from [facility] reportedly normal  - TSH 0.436 as of 12/20/16 FT4 wnl  - 10/31/16: eval w Endocrine Dr. Tawanna Cooler [place] [facility]): FT4 mildly elevated on 200 mcg day - decrease to 150 mcg daily and recheck FT4 in 8 weeks       . Hemianopia, homonymous, left 09/15/2015     Priority: Low     - evaluation w Ophthalmology Dr. Ferd Glassing /24/17: this patient continues to do exceptionally well almost 13 years following resection of a right parietooccipital falcine meningioma. Prior to surgery, he had an inferior left homonymous defect that improved following surgery and has subsequently stabilized with minimal residual.Even though he has a moderate residual lesion abutting and occluding the posterior sagittal sinus, with slight compression of the medial aspect of the left occipital lobe, his examin today remains absolutely stable with no evidence of a worsening field defect and no evidence of papilledema or optic atrophy. He has a stable nevus in the right fundus. I will see him again in 1-2 years. In meantime, he has asked for a suggestion of an endocrinologist who deals with pituitary hormone dysfunction and he also may be interested in seeing a radiation  therapist for discussion of stereotactic radiosurgery using a gamma knife (as opposed to the Cyberknife). I have recommended that he discuss these issues with [name] when he sees him tomorrow.     . Secondary male hypogonadism 01/24/2013     Priority: Low     - - 10/31/16: eval w Endocrine Dr. Tawanna Cooler [place] [facility]): testosterone level is excellent right now - continue current therapy         . Spondylosis of cervical region without myelopathy or radiculopathy 08/09/2017     - MRI C Spine w/o contrast: C2-C3: Small posterior disc bulge partially effacing the ventral CSF and gently indenting the ventral cervical spinal cord. There is no underlying cord signal abnormality. There is no central canal nor is there foraminal narrowing. C3-C4: There is a disc osteophyte complex with a superimposed posterior central disc protrusion which partially effaces the ventral CSF and indents the ventral cervical spinal cord. There is no central canal stenosis or foraminal narrowing. C4-C5: There is a disc osteophyte complex with prominent posterior lateral disc component within the foramina bilaterally, right greater than left. Posterior disc bulge partially effaces the ventral CSF and indents the right ventral cervical spinal cord. There is mild central canal stenosis. There is no foraminal narrowing. C5-C6: There is disc space narrowing and disc desiccation. There is a  disc osteophyte complex with prominent uncovertebral hypertrophy, right greater than left. Posterior disc bulge partially effaces the ventral CSF. There is no central canal stenosis. There is mild to moderate right-sided foraminal encroachment. The left foramen is patent. C6-C7: There is disc space narrowing and disc desiccation. There is a disc osteophyte complex with right posterior lateral disc herniation partially effacing the ventral CSF and mildly flattening. The underlying cervical spinal cord. There is marked right-sided  foraminal encroachment as well as mild left-sided foraminal narrowing. There is facet arthropathy. C7-T1: There is no evidence of central canal or foraminal stenosis. Overall impression: Multilevel cervical spondylosis as described in detail above.  - evaluation [facility]. Penni Bombard Nov 2018     . History of herniated intervertebral disc 01/17/2017     2005: developed R hip and leg pain and was told he had "small central disk herniation" at L5-S1. Surgery was not recommended. He received PT and some epidural corticosteroid injections which improved but did not completely resolve the situation. Low back and leg pain recurred in 2006 and was again tx w steroid and PT.      . Mild OSA  01/17/2017     - 10/31/16: eval w sleep medicine Dr. Ala Dach [facility]): reviewed original records from sleep study in Sept 2017 and CPAP trial in Oct 2017 ... My opinion at this time his OSA is not sig enough to require a focused effort on using his CPAP consistently ... Particularly since he reports not having found CPAP to be helpful or restful, I think it is best to set it aside for the time being and at some point another sleep lab study and titration may be helpful. ... Depending on comorbidities this degree of OSA is worth an effort at treating but when there has not been any consistent benefit even in the past when he tried it consistently, then it is reasonable to put on hold for the time being.      . Chronic diarrhea 01/17/2017     - eval w ID 11/21/16 Dr. Marcy Panning [facility]): has had extensive evaluation. I cannot identify an infectious disease that is likely to cause symptomatology. As he does have some cognitive decline and chronic diarrhea I will be checking a whipple PCR on blood although I think this is highly unlikely as he does not also have joint symptoms. A duodenal aspirate would be needed to confirm or completely rule out this diagnosis. I will also be checking for heavy metals. Given  the amount of fish he consumes I am expecting to have high levels organic arsenic which are not toxic. . I will also be testing for stronglyloides which can be found in [place] and [place]. I will call if any of these tests are positive.      . Recurrent sinusitis 01/17/2017     - 11/21/16: eval w ENT Dr. Jannette Spanner [facility]): For recurrent sinusitis, he is on good medical regiment w daily nasal irrigations. Surgery would be indicated for 4-5 episodes of sinusitis per year. He was not interested in operative intervention and this is reasonable. In re to his deviated septum septoplasty could be considered. He was not interested at this time. To address his nasal obstruction on a mucosal basis I recommended triple spray. Avoid afrin. Offered him fluoroscopic eval of his dysphagia but he deferred. Would not recommend surgery for OSA.      [name] Panhypopituitarism 03/29/2016   . Chronic, continuous use of opioids  11/09/2015     - As of May 2018 pt reports he gets his pain medication and klonazepam from [facility]  - 07/01/16: note from [facility] Dr. Toni Amend: Pt informed I will no longer refill percocet, clonazepam, or provigil as there are no clear disease indications. Pt upset and feels he needs these meds for his CF/FMS. I advised pt that he will need to have either rheum or pain specialist provide if they feel they are indicated. Pt has shown no evidence of abuse, but also has been on meds for several years and has shown no benefit in my opinion. Pt is currently being treated for panhypopit and adrenal insuff, hypogonadism, and hypothyroidism as a result, as wellas, growth hormone deficiency. Further analysis suggests polypharmacy with potential drug interactions. The pt knows how to wean clonazepam and has asked for 30days of tramadol to wean from the percocet. Note he is only on a half tablet of percocet qid. As a result will provide only 25mg  tabs to start qday and increase every third day to bid and tid max for 30  days total. No refills will be provided. Pt is aware.      [name] History of asthma 01/24/2013     - since childhood    - 11/23/16: eval w Dr. Renita Papa (Pulm at [facility]): bronchial asthma mild, allergic rhinitis postnasal drainage: uses albuterol BID for chest tightness. Check nitric oxide test. He has had intermittent eosinophilia and this points towards an eosinophilic asthma phenotype. He does have allergic rhinitis and think he would be better servied with our triple combination nose spray which includes mometasone, diphenhydramine and iptratroprium. I gave him rx for this.      [name] BPH (benign prostatic hyperplasia) 01/24/2013     As far as the AI, there was a question on whether he absorbs the [facility]. A 24 hour UFC was obtained and showed elevated free cortisol values indicative of adequate absorption. The patient raised the question of whether he could be metabolizing the [facility] "too fast" and hence explaining the increased need of HC dosing.   In addition to [facility], he is on growth Hormone replacement and the doses are adjusted by the [place] [facility]. He is on testosterone and levothyroxine a well. He is not of desmopressin.  He has HTN and hyperlipidemia and is on a statin and anti hypertensive drug therapy.      Clinically his main issue is chronic pain, mainly in the back of the neck and shoulder areas, as well as fatigue.  Fibromyalgia/chronic fatigue syndrome was entertained. His weight has been stable overall. He has required different pain medication regimens in order to allow him to cop with the pain.   He describes episodes of what he refers to as "adrenal crisis" whee he feels nauseated, dizzy and very tired. He feels much better after few days of high doses hydrocortisone. He also used parenteral Solucortef in the past to treat his symptoms.     Past Medical History:   Diagnosis Date   . Aortic regurgitation    . Ascending aortic aneurysm 09/19/2018    4 cm ECHO   . Avascular necrosis 2008    R hip  - did not require surgery   . Bilateral cataracts     budding   . Brain tumor 2003    Taken out in 2003, has returned    . Calculus of kidney    . Cold intolerance    . Color blindness    .  Disorder of prostate     BPH   . DJD (degenerative joint disease)     Lumbar / Sacral   . Dry eyes     uses restasis eyedrops which helps (RF, CCP, CRP and ESR have been wnl in past)   . Dysautonomia    . Eczema 03/29/2016    Right thumb    . Encounter for blood transfusion 2004   . Encounter for hepatitis C screening test for low risk patient 08/2016    negative   . Encounter for pharmacogenetic testing 10/2015    MEDIMAP   . Fibromyalgia    . Gastroesophageal reflux disease    . Hemorrhoids    . Hyperlipidemia    . Hypertension    . Hypothyroidism    . Lateral epicondylitis     Left   . Malignant neoplasm of skin    . Mitral valve insufficiency    . Osteoporosis    . Peripheral neuropathy 12/26/2019   . Screening PSA (prostate specific antigen) 02/14/2018    0.4   . Sleep apnea     mild, no cpap   . Tinnitus     high pitched buzzing comes and goes - started after craniotomy for meningioma     Past Surgical History:   Procedure Laterality Date   . COLONOSCOPY  2008   . ECHOCARDIOGRAM, TRANSTHORACIC  04/29/2005    63   . ECHOCARDIOGRAM, TRANSTHORACIC  10/17/2016    63   . EGD, COLONOSCOPY N/A 04/16/2019    Procedure: EGD, COLONOSCOPY;  Surgeon: Leatha Gilding, MD;  Location: [facility];  Service: Gastroenterology;  Laterality: N/A;  egd, colonoscopy  q1-unk, md req 45 min   . EXCISION, LESION  12/03/2008    benign lesion   . ORCHIOPEXY Left 1980    for intermittent torsion   . Subtotal resection of posterior sagittal meningioma  12/05/2002    Partial brain resection secondary to meningioma at [facility]   . TONSILLECTOMY AND ADENOIDECTOMY  as a child   . TOOTH EXTRACTION Right 06/2018     Family History   Problem Relation Age of Onset   . Asthma Daughter    . Tuberculosis Mother    . Dementia Mother    . Migraines Sister       Allergies   Allergen Reactions   . Bextra [Valdecoxib] Rash     Social History     Socioeconomic History   . Marital status: Married     Spouse name: Not on file   . Number of children: Not on file   . Years of education: Not on file   . Highest education level: Not on file   Occupational History   . Not on file   Tobacco Use   . Smoking status: Never Smoker   . Smokeless tobacco: Never Used   Vaping Use   . Vaping Use: Never used   Substance and Sexual Activity   . Alcohol use: No     Comment: rare   . Drug use: Yes     Comment: uses MJ for pain daily   . Sexual activity: Not on file   Other Topics Concern   . Not on file   Social History Narrative    He lives in [place] and is from [place]. Married - wife Angelique Blonder. Two daughters (one lives in [place] and one in [place]). Retired Web designer - retired in 2016.     Social Determinants of Health  Financial Resource Strain:    . Difficulty of Paying Living Expenses:    Food Insecurity:    . Worried About Programme researcher, broadcasting/film/video in the Last Year:    . Barista in the Last Year:    Transportation Needs:    . Freight forwarder (Medical):    [name] Lack of Transportation (Non-Medical):    Physical Activity:    . Days of Exercise per Week:    . Minutes of Exercise per Session:    Stress:    . Feeling of Stress :    Social Connections:    . Frequency of Communication with Friends and Family:    . Frequency of Social Gatherings with Friends and Family:    . Attends Religious Services:    . Active Member of Clubs or Organizations:    . Attends Banker Meetings:    [name] Marital Status:    Intimate Partner Violence:    . Fear of Current or Ex-Partner:    . Emotionally Abused:    [name] Physically Abused:    . Sexually Abused:      Current Outpatient Medications on File Prior to Visit   Medication Sig Dispense Refill   . albuterol (PROAIR HFA) 108 (90 Base) MCG/ACT inhaler INHALE ONE PUFF BY MOUTH EVERY FOUR TO SIX HOURS AS NEEDED FOR WHEEZING (Patient taking  differently: 2 puffs 2 (two) times daily INHALE ONE PUFF BY MOUTH EVERY FOUR TO SIX HOURS AS NEEDED FOR WHEEZING  ) 25.5 g 1   . AMITRIPTYLINE HCL PO Take 20 mg by mouth nightly        . BELBUCA 450 MCG Film 2 (two) times daily        . Calcium Carbonate-Vitamin D (CALCIUM + D PO) Take by mouth. 500/400mg   daily      . clonazePAM (KLONOPIN) 0.5 MG tablet Take 0.5 mg by mouth daily as needed     2   . cyanocobalamin 1000 MCG tablet every 24 hours     . docusate sodium (Colace) 100 MG capsule Take 100 mg by mouth 2 (two) times daily     . ezetimibe (ZETIA) 10 MG tablet Take 1 tablet (10 mg total) by mouth daily 90 tablet 3   . hydrocortisone (CORTEF) 20 MG tablet Take 1 tablet (20 mg total) by mouth daily Take 4 PO TID F3 90 tablet 1   . levothyroxine (SYNTHROID) 150 MCG tablet TAKE ONE TABLET BY MOUTH ONE TIME DAILY AT 6 PM 90 tablet 0   . melatonin 10 mg tablet Take 10 mg by mouth nightly     . montelukast (SINGULAIR) 10 MG tablet Take one tablet by mouth at bedtime as directed 90 tablet 1   . Multiple Vitamins-Minerals (CENTRUM SILVER PO) Take 1 tablet by mouth daily.       . ondansetron (ZOFRAN-ODT) 4 MG disintegrating tablet 4 mg as needed     . pantoprazole (PROTONIX) 40 MG tablet Take 40 mg by mouth daily     . prochlorperazine (COMPAZINE) 10 MG tablet Take 5 mg by mouth as needed        . promethazine (PHENERGAN) 25 MG suppository as needed     . psyllium (Metamucil) 0.52 g capsule Take 1.6 g by mouth 2 (two) times daily     . senna (SENOKOT) 8.6 MG tablet Take 17.2 mg by mouth 2 (two) times daily     . SYMBICORT 80-4.5 MCG/ACT inhaler Inhale 2  puffs into the lungs two times daily (Patient taking differently: 2 puffs 2 (two) times daily Have patient rinse mouth after use  ) 10.2 g 3   . VITAMIN D, CHOLECALCIFEROL, PO Take 5,000 Units by mouth daily.        [name] zolpidem (AMBIEN) 10 MG tablet Take 1 tablet (10 mg total) by mouth nightly as needed for Sleep 20 tablet 0   . [DISCONTINUED] simvastatin (ZOCOR) 20 MG  tablet Take 1 tablet (20 mg total) by mouth nightly 90 tablet 1   . modafinil (PROVIGIL) 200 MG tablet Take 200 mg by mouth daily PRN         No current facility-administered medications on file prior to visit.     Vitals:    12/31/19 1452   BP: 163/81   Pulse: 97   Temp:    SpO2:      Lab Results   Component Value Date    HGBA1C 6.2 (H) 11/27/2019    HGBA1C 6.3 (H) 09/06/2018    HGBA1C 6.2 (H) 02/13/2018     Lab Results   Component Value Date    WBC 12.8 (H) 11/27/2019    HGB 14.9 11/27/2019    HCT 43.7 11/27/2019    PLT 243 11/27/2019    CHOL 177 02/13/2018    TRIG 237 (H) 02/13/2018    HDL 54 02/13/2018    LDL 76 02/13/2018    ALT 42 11/27/2019    AST 29 11/27/2019    NA 140 11/27/2019    K 4.3 11/27/2019    CL 103 11/27/2019    CREAT 0.91 11/27/2019    BUN 23 11/27/2019    CO2 23 11/27/2019    TSH 0.155 (L) 11/27/2019    PSA 0.4 02/13/2018    INR 1.0 05/22/2018    GLU 125 (H) 11/27/2019    HGBA1C 6.2 (H) 11/27/2019     Lab Results   Component Value Date    TSH 0.155 (L) 11/27/2019    T4 7.4 09/06/2018     Review of Systems    Constitutional: fatigue and chronic pain  Eyes: negative  Respiratory: negative  Cardiovascular: negative  Gastrointestinal: negative  Musculoskeletal: negative   Neurological: negative  Behavioral: negative  Skin: negative  Hematology: negative      Examination  Vitals:    12/31/19 1452   BP: 163/81   Pulse: 97   Temp:    SpO2:      Constitutional: Oriented to person, place, and time and well-developed, well-nourished, and in no distress.   Head: Normocephalic and atraumatic.   Eyes: Conjunctivae and EOM are normal. Pupils are equal, round, and reactive to light.   Neck: Normal thyroid exam.  Cardiac: Normal auscultation.  Pulmonary: Normal auscultation.  Extremities: No cyanosis, clubbing or edema.  Musculoskeletal: Normal range of motion.   Neurological: Alert and oriented to person, place, and time.   Psychiatric: Affect and judgment normal.  He does not have cushingoid  features.      Lab Review    Results for Jamison Neighbor, MD    Ref. Range 03/23/2018 14:33 11/27/2019 14:59   Testosterone Latest Ref Range: 264 - 916 ng/dL 161 (L) 096 (L)       Imaging report    CT Head WO Contrast [[id number]] (Order [id number])   Status: Final result   Study Result     History: Nausea. History of meningioma.    TECHNIQUE: Noncontrast CT of the brain. The following dose reduction  techniques were utilized: automatic exposure control and/or adjustment  of mA and/or kV according to patient size, and the use of iterative  reconstruction technique.    COMPARISON: MRI of the brain dated 06/07/2016.    FINDINGS: Again seen are postoperative changes with evidence of parietal  and occipital craniotomy. There is stable encephalomalacia involving the  right parietal and occipital lobes. There is a 27 x 24 mm lobulated  partially calcified extra-axial mass centered upon the posterior aspect  of the falx cerebri, eccentric towards the left, which appears to invade  the superior sagittal sinus. This lesion corresponds to the enhancing  extra-axial mass seen on the prior MRI study and is consistent with  meningioma. The mass indents the medial left parietal and occipital  lobes without significant rectal edema.    There is no evidence of acute infarction or intracranial hemorrhage. The  ventricles are normal without hydrocephalus. The orbits appear  unremarkable. The visualized portions of the paranasal sinuses are  clear.    IMPRESSION:     1. No acute intracranial abnormality.  2. Partially calcified extra-axial mass centered upon the posterior  aspect of the falx cerebri which invades the superior sagittal sinus is  similar in configuration compared to the previous MRI study of  06/07/2016 and is consistent with meningioma.    Georgann Housekeeper, MD   03/11/2017 11:39 AM     MRI Brain W WO Contrast [[id number]] (Order [id number])   Status: Final result   Study Result     [facility]  MRI [place], [place] EXAM PERFORMED AT:  [id number] DOB:Jan 31, 1951 [address]  06/07/2016 AGE:66 Gender:M Physicians Only: [phone]        Coralyn Mark MD [F]  [address]  [place], [place] [address]        MRI BRAIN WITHOUT AND WITH CONTRAST    HISTORY: Followup meningioma    COMPARISON: A prior brain MR 09/10/15    TECHNIQUE: MR imaging of the brain was performed on a 1.5 Tesla magnet  prior to and following the uneventful intravenous administration of 8 cc of  Gadavist contrast material.    FINDINGS: Again noted are postsurgical changes centered in the right  occipital lobe. There is a resection cavity surrounded by hyperintense  T2/FLAIR signal which is stable in volume. No masslike or nodular  enhancement is seen at the level of resection.    Again identified is an extra-axial enhancing mass lesion involving the  midline occipital and posterior left parietal region. It measures  approximately 2.8 cm AP x 2.5 cm transverse x 4.3 cm tall (compared to 2.8  cm AP x 2.5 cm transverse x 4 cm tall when measured on the postcontrast  images). Again seen is invasion/occlusion of the adjacent superior sagittal  sinus. There is mild mass effect on the left parasagittal occipital lobe  however there is no cerebral edema.    There is stability of a subtle dural thickening in the left parafalcine  region superiorly.    The brain parenchyma maintains an otherwise normal signal and enhancement  pattern. Ventricular size and position is normal. There are appropriate  flow voids of the major vessels.    There is stability of a largely CSF filled sella turcica.    There is progressive opacification of the left mastoid air cells. There is  no significant paranasal sinus disease.    IMPRESSION:      1. Again  seen is a left posterior parafalcine meningioma. There is a  slightly greater measurement in the cephalocaudal plane which may be  related to technique or minimal tumor progression.  2. Progressive opacification of the left mastoid air cells.  3. Otherwise stable exam.    The examination was reviewed with the patient.     Electronically signed by: Otho Ket M.D.  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 Patient is currently on [facility] for (AI), levothyroxine, testosterone and growth hormone replacement.  Will order an ACTH stimulation tests to see how much of adrenal reserve he has.  As far a steststerone (T), his T levels are sub-optimal and I recommended a target T level in the 440 to 500 ng/dL for optimal efficacy. Will titrate the dose of the topical androgen to achieve that goal.  Repeat T level in few weeks after dose totration  He can return in few months or as needed.      Elijio Miles.H.Tamera Punt, MD  [facility]

## 2020-02-19 ENCOUNTER — Encounter (INDEPENDENT_AMBULATORY_CARE_PROVIDER_SITE_OTHER): Payer: Self-pay

## 2020-02-23 ENCOUNTER — Encounter: Payer: Self-pay | Admitting: Internal Medicine

## 2020-02-24 ENCOUNTER — Other Ambulatory Visit: Payer: Self-pay | Admitting: Internal Medicine

## 2020-02-24 DIAGNOSIS — Z20822 Contact with and (suspected) exposure to covid-19: Secondary | ICD-10-CM

## 2020-02-25 ENCOUNTER — Encounter: Payer: Self-pay | Admitting: Internal Medicine

## 2020-02-25 ENCOUNTER — Telehealth: Payer: Self-pay | Admitting: Internal Medicine

## 2020-02-25 DIAGNOSIS — Z20822 Contact with and (suspected) exposure to covid-19: Secondary | ICD-10-CM

## 2020-02-25 NOTE — Telephone Encounter (Signed)
Pt called to schedule Covid antibody test, ordered by Dr. Orlin Hilding- would like to come to FO.  I scheduled the appt & did a COVID screening:    Call        COVID-19 Questionnaire:          Fever: NO     Cough: NO     Chills: NO     Short of breath: NO     Sore Throat: NO     New Headache: NO     Loss of taste or smell: NO     Body aches not from physical activity: NO     Close contact with a COVID-19 person (Mask and 6 feet distancing does not count)?: NO     Recently been tested for COVID-19?: NO     Ever been diagnosed with COVID-19?: NO     Live in a group living residence (such as an assisted-living facility, nursing home, shelter, or dormitory)?: NO     Visited a group living residence (assisted-living facility, nursing home, shelter, or dormitory)? NO     Traveled outside [place]? NO    Any social gatherings without masking or distancing? No     Work or Agricultural consultant in a healthcare facility?: NO                       YES to any of these will be telemedicine visit unless it is an emergency. Please advise clinical team for in person visits if any of these are answered yes.      Assessment/Plan:     Patient advised to call from car upon arrival. Instructed to wear face mask if patient does not have one, one will be issued to them. Advised temperature will be taken in lobby of building. Instructed not to bring companion if not medically necessary and do not wear gloves due to cross contamination. Hand sanitizer and soap are readily available for use in office. Patient verbalized understanding.

## 2020-02-26 ENCOUNTER — Encounter: Payer: Self-pay | Admitting: Internal Medicine

## 2020-02-26 ENCOUNTER — Ambulatory Visit: Payer: Medicare Other

## 2020-02-26 DIAGNOSIS — Z20822 Contact with and (suspected) exposure to covid-19: Secondary | ICD-10-CM

## 2020-02-26 NOTE — Progress Notes (Signed)
Dear [name]:    Dr. Alvino [name] who read your MRI is a neuroradiologist (link to his bio is below) - not sure about the disparity here between Dr. Emeline [name] sense of this and Dr. Teresa [name] formal read but I will send Dr. Nilda [name] your note and concern about this so he is aware - regardless whether they determine if there is slight growth vs stability if the only plan is for reimaging again in 6 months not sure if any discussion or re-read will change things. Who has done your visual field testing in the past - Dr. Lilia [name] or Dr. Hyacinth [name]?    MRI BRAIN WITHOUT AND WITH CONTRAST   HISTORY:  Meningioma   COMPARISON: MRI on 08/25/2018   TECHNIQUE: MRI examination of the brain was performed both prior to and  after administration of intravenous contrast material. Intravenous Gadavist  8 cc utilized.   FINDINGS: Prior cranial surgery is again present for tumor resection. There is an avidly enhancing extra-axial mass in the posterior parafalcine region, mainly on the left side; this has a multilobulated configuration. The major portions of it measure 5.3 x 3.1 x 3.2 cm (ht x width x AP). This is felt to be stable from the prior examination, by my measurements. There is continued tumoral occlusion of the posterior aspect of the superior sagittal sinus.     Encephalomalacia and gliosis are present, mainly in the right parietal occipital junction.     No other meningioma is identified. There is no intra-axial mass.     There is no acute infarct. No areas of parenchymal diffusion restriction are present. There is no hydrocephalus or pathologic fluid collection. There is no midline shift or herniation. No worrisome bone lesions are present. The major intracranial flow voids are grossly normal.      IMPRESSION:   Stable posterior parafalcine meningioma with continued segmental occlusion of the superior sagittal sinus.     Electronically signed by: Shannon [name] M.D.  [Interpreted at: 22]  [facility]    [email or web address]    Please contact me if you have any additional concerns or questions.    Shannon Dell, MD  [facility]  [address], [address]  [place], [place] [address]  P) [phone]  F) [phone]  [email or web address]    ===View-only below this line===  ----- Message -----       From:Shannon Enid Derry, MD       Sent:02/25/2020  2:38 PM EDT         QM:[id number] Shannon Sorenson, MD    Subject:Test Results Question    Just had a virtual visit with Dr. Nilda [name] who said that he reviewed my most recent brain MRI (12/11/19) and does not agree with the [facility] report.  He believes the meningioma has grown and recommends a 6 month f/u.  Could you please have a neuro-radiologist  review my study and let me know?  If it has grown, I probably need visual field testing since I already have visual defects.  Thank you,  Shannon [name]

## 2020-02-26 NOTE — Progress Notes (Signed)
Blood Draw:  • Fasting? = non-fasting  • Location of stick: right ac using aseptic technique.  • Outcome: Patient tolerated well without any complications. Attempt # 1.    • Collected:   o 1Tiger top tubes.      • Lab/Temperature: [facility]/Refrigerated

## 2020-02-27 LAB — SARS COV 2 ANTIBODY IGG: DiaSorin SARS-CoV-2 Ab, IgG: POSITIVE

## 2020-03-02 ENCOUNTER — Encounter: Payer: Self-pay | Admitting: Internal Medicine

## 2020-03-03 ENCOUNTER — Other Ambulatory Visit: Payer: Self-pay | Admitting: Internal Medicine

## 2020-03-03 DIAGNOSIS — J4540 Moderate persistent asthma, uncomplicated: Secondary | ICD-10-CM

## 2020-03-03 MED ORDER — MONTELUKAST SODIUM 10 MG PO TABS
ORAL_TABLET | ORAL | 0 refills | Status: DC
Start: 2020-03-03 — End: 2020-05-18

## 2020-03-03 NOTE — Addendum Note (Signed)
Addended by: Faythe Dingwall on: 03/03/2020 11:04 AM     Modules accepted: Orders

## 2020-03-03 NOTE — Telephone Encounter (Signed)
Patient called requesting refill for medication Montelukast (SINGULAIR) 10 mg, and to have it sent to the [facility] [id number] in [place], [place]. Patient is going out of town tomorrow and would like the medication today if possible. Please advise.

## 2020-03-03 NOTE — Telephone Encounter (Signed)
Med refilled.

## 2020-03-04 ENCOUNTER — Other Ambulatory Visit: Payer: Self-pay

## 2020-03-04 DIAGNOSIS — K117 Disturbances of salivary secretion: Secondary | ICD-10-CM

## 2020-03-04 MED ORDER — PILOCARPINE HCL 5 MG PO TABS
5.0000 mg | ORAL_TABLET | Freq: Three times a day (TID) | ORAL | 0 refills | Status: DC | PRN
Start: 2020-03-04 — End: 2020-06-02

## 2020-03-04 NOTE — Telephone Encounter (Signed)
Chart reviewed.  Noted prn use of pilocarpine for dry mouth.  Med refilled.

## 2020-03-09 ENCOUNTER — Encounter (INDEPENDENT_AMBULATORY_CARE_PROVIDER_SITE_OTHER): Payer: Self-pay

## 2020-03-16 ENCOUNTER — Telehealth (INDEPENDENT_AMBULATORY_CARE_PROVIDER_SITE_OTHER): Payer: Medicare Other | Admitting: Adult Health

## 2020-03-16 ENCOUNTER — Encounter (INDEPENDENT_AMBULATORY_CARE_PROVIDER_SITE_OTHER): Payer: Self-pay | Admitting: Adult Health

## 2020-03-16 VITALS — BP 140/85 | HR 70 | Ht 69.0 in | Wt 176.0 lb

## 2020-03-16 DIAGNOSIS — I10 Essential (primary) hypertension: Secondary | ICD-10-CM

## 2020-03-16 DIAGNOSIS — I951 Orthostatic hypotension: Secondary | ICD-10-CM

## 2020-03-16 MED ORDER — METOPROLOL SUCCINATE ER 25 MG PO TB24
25.00 mg | ORAL_TABLET | Freq: Every evening | ORAL | 0 refills | Status: DC
Start: 2020-03-16 — End: 2020-06-02

## 2020-03-16 MED ORDER — AMLODIPINE BESYLATE 5 MG PO TABS
5.0000 mg | ORAL_TABLET | Freq: Two times a day (BID) | ORAL | 3 refills | Status: DC
Start: 2020-03-16 — End: 2020-06-02

## 2020-03-16 NOTE — Progress Notes (Signed)
[facility] TELEMEDICINE PROGRESS NOTE    [facility] OFFICE -CARDIOLOGY  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  Medical Record #: [id number]  Requesting Physician: Modesto Charon, MD      CHIEF COMPLAINT:  Follow-up (blood pressure)      HISTORY OF PRESENT ILLNESS:  The visit today was conducted via telemedicine due to COVID-19 precautions.  The patient was in their home and was informed and gave verbal consent to proceed.    He is a pleasant 69 y.o. male who is seen today by telemedicine for follow-up of orthostatic hypotension on background of hypertension.  His wife was present on the call.  Last seen by me on June 16.  His blood pressures were doing better although he was still having some spikes up into the 150s range.  I increased metoprolol to 50 mg daily and instructed the patient to take this at bedtime.  Unfortunately, he could not tolerate this dose due to excessive fatigue.  His blood pressures remain in the 150s/80s range.  Heart rates are occasionally up into the 90s and low 100s.    He says he is "not doing well."  He continues to have spells of nausea and fatigue that persist for days and leave him quite debilitated.  He tells me his vital signs seem the same during the spells.  He has been to [facility] and [facility] and sees several specialists locally.  Unfortunately, no one has been able to really help him and his symptoms persist.  His wife states that [facility] believes he needs to enroll in a fibromyalgia program.      PAST MEDICAL HISTORY: He has a past medical history of Aortic regurgitation, Ascending aortic aneurysm (09/19/2018), Avascular necrosis (2008), Bilateral cataracts, Brain tumor (2003), Calculus of kidney, Cold intolerance, Color blindness, Disorder  of prostate, DJD (degenerative joint disease), Dry eyes, Dysautonomia, Eczema (03/29/2016), Encounter for blood transfusion (2004), Encounter for hepatitis C screening test for low risk patient (08/2016), Encounter for pharmacogenetic testing (10/2015), Fibromyalgia, Gastroesophageal reflux disease, Hemorrhoids, Hyperlipidemia, Hypertension, Hypothyroidism, Lateral epicondylitis, Malignant neoplasm of skin, Mitral valve insufficiency, Osteoporosis, Peripheral neuropathy (12/26/2019), Screening PSA (prostate specific antigen) (02/14/2018), Sleep apnea, and Tinnitus. He has a past surgical history that includes EXCISION, LESION (12/03/2008); Colonoscopy (2008); Tonsillectomy and adenoidectomy (as a child); Orchiopexy (Left, 1980); Subtotal resection of posterior sagittal meningioma (12/05/2002); Tooth extraction (Right, 06/2018); EGD, COLONOSCOPY (N/A, 04/16/2019); ECHOCARDIOGRAM, TRANSTHORACIC (04/29/2005); and ECHOCARDIOGRAM, TRANSTHORACIC (10/17/2016).    ALLERGIES:   Allergies   Allergen Reactions   . Bextra [Valdecoxib] Rash       MEDICATIONS:   Current Outpatient Medications   Medication Sig   . albuterol (PROAIR HFA) 108 (90 Base) MCG/ACT inhaler INHALE ONE PUFF BY MOUTH EVERY FOUR TO SIX HOURS AS NEEDED FOR WHEEZING (Patient taking differently: 2 puffs 2 (two) times daily INHALE ONE PUFF BY MOUTH EVERY FOUR TO SIX HOURS AS NEEDED FOR WHEEZING  )   . AMITRIPTYLINE HCL PO Take 20 mg by mouth nightly      . amLODIPine (NORVASC) 5 MG tablet Take 1 tablet (5 mg total) by mouth 2 (two) times daily   . BELBUCA 450 MCG Film 2 (two) times daily      .  Calcium Carbonate-Vitamin D (CALCIUM + D PO) Take by mouth. 500/400mg   daily    . clonazePAM (KLONOPIN) 0.5 MG tablet Take 0.5 mg by mouth daily as needed      . cyanocobalamin 1000 MCG tablet every 24 hours   . docusate sodium (Colace) 100 MG capsule Take 100 mg by mouth 2 (two) times daily   . ezetimibe (ZETIA) 10 MG tablet Take 1 tablet (10 mg total) by mouth daily   .  finasteride (PROSCAR) 5 MG tablet Take 1 tablet (5 mg total) by mouth nightly   . hydrocortisone (CORTEF) 20 MG tablet Take 1 tablet (20 mg total) by mouth daily Take 4 PO TID F3 (Patient taking differently: Take 20 mg by mouth daily   )   . levothyroxine (SYNTHROID) 150 MCG tablet TAKE ONE TABLET BY MOUTH ONE TIME DAILY AT 6 PM   . metoprolol succinate XL (Toprol XL) 25 MG 24 hr tablet Take 1 tablet (25 mg total) by mouth nightly   . modafinil (PROVIGIL) 200 MG tablet Take 200 mg by mouth daily PRN     . montelukast (SINGULAIR) 10 MG tablet Take one tablet by mouth at bedtime as directed   . ondansetron (ZOFRAN-ODT) 4 MG disintegrating tablet 4 mg as needed   . pantoprazole (PROTONIX) 40 MG tablet Take 40 mg by mouth daily   . pilocarpine (SALAGEN) 5 MG tablet Take 1 tablet (5 mg total) by mouth 3 (three) times daily as needed (dry mouth)   . prochlorperazine (COMPAZINE) 10 MG tablet Take 5 mg by mouth as needed      . promethazine (PHENERGAN) 25 MG suppository as needed   . psyllium (Metamucil) 0.52 g capsule Take 1.6 g by mouth 2 (two) times daily   . ramipril (ALTACE) 10 MG capsule Take 2 capsules (20 mg total) by mouth daily   . senna (SENOKOT) 8.6 MG tablet Take 17.2 mg by mouth 2 (two) times daily   . simvastatin (ZOCOR) 20 MG tablet Take 1 tablet (20 mg total) by mouth nightly   . SYMBICORT 80-4.5 MCG/ACT inhaler Inhale 2 puffs into the lungs two times daily (Patient taking differently: 2 puffs 2 (two) times daily Have patient rinse mouth after use  )   . tamsulosin (FLOMAX) 0.4 MG Cap Take 1 capsule (0.4 mg total) by mouth Daily after dinner   . Testosterone 20.25 MG/ACT (1.62%) Gel Apply 4 actuations daily (2 on each shoulder)   . zolpidem (AMBIEN) 10 MG tablet Take 1 tablet (10 mg total) by mouth nightly as needed for Sleep        FAMILY HISTORY: family history includes Asthma in his daughter; Dementia in his mother; Migraines in his sister; Tuberculosis in his mother.    SOCIAL HISTORY: He reports that  he has never smoked. He has never used smokeless tobacco. He reports current drug use. He reports that he does not drink alcohol.    PHYSICAL EXAMINATION    Vital Signs:   Visit Vitals  BP 140/85 (BP Site: Left arm)   Pulse 70   Ht 1.753 m (5\' 9" )   Wt 79.8 kg (176 lb)   BMI 25.99 kg/m       Constitutional: Cooperative, alert and oriented, in no acute distress, tired and lying in bed  Head: normocephalic      Eyes: conjunctivae and lids unremarkable  ENT: No pallor or cyanosis   Psychiatric:  normal memory  Neurological: No gross motor deficits noted, affect appropriate, oriented  to time, person and place.      LABS:   Lab Results   Component Value Date    WBC 12.8 (H) 11/27/2019    HGB 14.9 11/27/2019    HCT 43.7 11/27/2019    PLT 243 11/27/2019     Lab Results   Component Value Date    GLU 125 (H) 11/27/2019    BUN 23 11/27/2019    CREAT 0.91 11/27/2019    NA 140 11/27/2019    K 4.3 11/27/2019    CL 103 11/27/2019    CO2 23 11/27/2019    AST 29 11/27/2019    ALT 42 11/27/2019     Lab Results   Component Value Date    MG 1.9 02/12/2019    TSH 0.155 (L) 11/27/2019    HGBA1C 6.2 (H) 11/27/2019     Lab Results   Component Value Date    CHOL 177 02/13/2018    TRIG 237 (H) 02/13/2018    HDL 54 02/13/2018    LDL 76 02/13/2018             IMPRESSION:   Mr. Laski is a 69 y.o. male with the following problems:    1. Complex symptomatic orthostatichypotension with spells of nausea and fatigue, likely related to his fibromyalgia and probable dysautonomia.  He has had workups at [facility] and [facility] and sees specialists locally.  2. Hypertension,  not ideally controlled although would not be overly aggressive in lowering his blood pressures due to #1.  3. Longstanding mild aortic valve regurgitation with a mildly dilated ascending aorta, stable by most recent echocardiogram in January 2020.  4. Hyperlipidemia, on statin and Zetia. Most recent LDL 76 in 2019.  5. Prior partial meningioma resection in 2004.  6.  Panhypopituitarism.  7. Hypothyroidism.  8. Peripheral neuropathy.  9. Cognitive issues.  10. Fibromyalgia and chronic fatigue syndrome, which remain quite debilitating.      RECOMMENDATIONS:    1. Continue his current cardiac medications.  2. Continue monitoring blood pressure and heart rate at home.  3. Follow-up with Dr. Tami Ribas in 3 to 4 months, sooner as needed.                                                   Orders Placed This Encounter   Procedures   . Office Visit ([facility] [place])       Orders Placed This Encounter   Medications   . metoprolol succinate XL (Toprol XL) 25 MG 24 hr tablet     Sig: Take 1 tablet (25 mg total) by mouth nightly     Dispense:  30 tablet     Refill:  0   . amLODIPine (NORVASC) 5 MG tablet     Sig: Take 1 tablet (5 mg total) by mouth 2 (two) times daily     Dispense:  180 tablet     Refill:  3         SIGNED:    Lyn Hollingshead, NP          This note was generated by the Dragon speech recognition and may contain errors or omissions not intended by the user. Grammatical errors, random word insertions, deletions, pronoun errors, and incomplete sentences are occasional consequences of this technology due to software limitations. Not all errors are caught or  corrected. If there are questions or concerns about the content of this note or information contained within the body of this dictation, they should be addressed directly with the author for clarification.

## 2020-03-25 ENCOUNTER — Ambulatory Visit (INDEPENDENT_AMBULATORY_CARE_PROVIDER_SITE_OTHER): Payer: Medicare Other | Admitting: Internal Medicine

## 2020-03-25 ENCOUNTER — Ambulatory Visit
Admission: RE | Admit: 2020-03-25 | Discharge: 2020-03-25 | Disposition: A | Discharge status: Home / Self Care | Payer: Medicare Other | Source: Ambulatory Visit | Attending: Internal Medicine | Admitting: Internal Medicine

## 2020-03-25 ENCOUNTER — Telehealth: Payer: Self-pay | Admitting: Internal Medicine

## 2020-03-25 ENCOUNTER — Encounter: Payer: Self-pay | Admitting: Internal Medicine

## 2020-03-25 VITALS — BP 127/63 | HR 68 | Temp 98.7°F | Resp 14 | Wt 182.8 lb

## 2020-03-25 DIAGNOSIS — R0781 Pleurodynia: Secondary | ICD-10-CM | POA: Insufficient documentation

## 2020-03-25 NOTE — Progress Notes (Signed)
Chief Complaint   Patient presents with   . 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    He was in his usual state of health until the last 2 or 3 days when he noted onset of some right-sided rib pain in his mid axillary line on the right.  There is no radiation of this pain.  He denies any injury or evident trauma or fall or major change in activity though he did notes he was pushing furniture recently.  He denies any dyspnea or hypoxia.  He mainly feels that when he pushes over this area but denies any exacerbation with deep breaths.     He is on chronic glucocorticoid treatment for secondary adrenal insufficiency.    He has a chronic history of chronic fatigue and fibromyalgia syndrome and is planning to consult with a infectious disease physician Dr. Azell Der to get her opinion on whether antivirals might be considered in treatment of his symptoms to see if he has benefit.  He has seen some recent research and clinical trial in this regard.  He has seen a variety of specialists previously including rheumatology endocrinology and others at [facility] and [facility] previously without clear diagnosis or benefit from current treatment and his quality of life is poor with chronic fatigue.  His medical history is summarized below    Active Problems  Patient Active Problem List    Diagnosis Date Noted   . Secondary adrenal insufficiency 01/17/2017     Priority: High     - 04/26/18: Dr. Beckie Salts [facility]): initial eval showed possible borderline glucocorticoid deficiency.  He has been taking supraphysiologic doses of hydrocortisone since 2017.  We do not think he has a hydrocortisone absorption problem based on random serum cortisol and 24-hour values.  We believe episodes of abd pain and nausea improved with SDS because of anti-inflammatory effects.  We are unable to generate a unifying endocrine dx that explains  sx.  Pt understands dangers of continuing supraphysiologic glucocorticoids but reluctant to reduce for fear of worsening illness.  We do feel other hormone replacement doses are more appropriate.  We do not rec changes. Pt asked for referral to fibromyalgia specialist.  Pt with h/o primary hypothy has empty sella and biochemical e/o hypogonadism and GH deficiency.  Originally, ACTH test borderline, but now after long-term glucocorticoid tx he has obvious central AI.  His thyroid and GH doses are if anything on high side.  We cannot attrribute sx to underdosing.  Explained this to Dr. [name] and have contacted [facility] to see if fibromyalgia expert or clinical trial are available .  - 09/29/17 Dr. Tamera Punt endo: Hypopituitarism in setting of brain surgery for meningioma. Pt currently on HC for (AI), levothyroxine, testosterone and GH replacement. The question as I d/w him is whether he is using HC to treat non specific sx or what he refers to as "quality of life" or whether he does need high doses due to some issue w HC metabolism. I explained to him sx he experiences are not specific for adrenal disease and there is concern about chronic over exposure to corticosteroid tx. I encouraged him to taper dose of HC to physiologic dose, in range of 20 mg in am and 10 mg in pm. One can also use dose that is based on body surface. In case of suspicion of "  adrenal crisis", I suggested he use one injection of Solucortef rather than high oral doses of HC for several days. I also recd opinion w Dr. Leonides Grills, Chief, Endocrinology at [facility], in order to discuss adrenal issue. I'm happy to continue to see him at [facility]. I also explained that have not prescribed GH replacement for adults so he will continue to follow with [facility]. He can return in few mos or as needed.  - 09/06/17: eval w Endocrine Dr. Eyvonne Mechanic [facility]): Although after we dx secondary AI, GH deficiency, hypogonadotropic hypogonadism, and primary hypot a/w a partially  empty sella on imaging, and he had initial + response to replacement as time has gone on, he has not done well and episodes where he gets very ill w muscle aches like he always has flu nausea, vomiting, LH and in general on physiologic hydrocortisone, he awakes in am with discomfort, particularly in back , and after meds it will be several hrs until he has energy to get up and do minor tasks. Around [place], he was ill with viral illness for 3 days and had crisis. He was tx w 100 mg of hydrocortisone orally every 8 hrs, and sx resolved. He subsequently tapered on oral hydrocortisone and in last 3-4 days has been taking between 130-110 mg total per day and continues to feel well as he tapering this. We are collecting a 24-hr urine free cortisol on this dose. His other levels are interesting as well. He is on GH at 0.3 mg a day. In past, this had IGF-1's at upper end of nl range to mildly elevated, now Z-score is just +1.28 and despite taking AndroGel cream on daily basis, testosterone level was 19, whereas in past this was nl. He is sure he had not forgotten testosterone. His thyroid tests similar to past with a nl free T4 of 1.5 and TSH mildly suppressed. The current ACTH is less than 1. A/P  #1 Empty sella w pituitary insufficiency  #2 Secondary AI  It is unusual that he feels well now after several days of high-dose hydrocortisone. Either he is malabsorbing hydrocortisone, he is super metabolizing hydrocortisone, he has developed cortisol resistance, or he has undx steroid-sensitive disease. There is really no evid for other disease with other testing done including multiple rheum antibodies. I did order genetic gene interaction testing and waiting for review of this from pharmacologist to see if suggestion of metabolism abnormalities re various hormones. The 24-hour urine cortisol should give hint regarding absorption. Plan we have developed is to continue to slowly taper oral hydrocortisone over next several  days to weeks, and I would note a tapering schedule depending on clinical response. I will get a baseline bone density and body composition, and check cortisol level morning 2-3 hrs after he took 50 mg of Cortef, and I am going to repeat testosterone 1-2 hrs after he applied AndroGel. #3 Secondary hypogonadism, on replacement : Unclear why testosterone is so different than before where we test this. It may be lab error, but it is unlikely he did not apply testosterone. #4 GH deficiency:  unclear why same dose is resulting in lower IGF-1, but increase dose to 0.4 mg per day.  #5 Primary hypot, on replacement  There is likely pituitary component, and will maintain replacement keeping FT4 stable at 1.5.  #6 Cognitive dysfunction  evaluated a year ago w/o specific dx. He feels now after several days of higher doses of hydrocortisone thinking is clearing. Mild ascending aortic  dilation and aortic insufficiency  Would rec'd repeating echo next year, #10 Meningioma  unchanged on MRI from 2008 and confirms slight increase from 2017. #11 Muscle pain  This has resolved with hydrocortisone. I doubt there is underlying muscle disease,   - 08/31/17: Pt email: advised by pcp to take 100 mg of hydrocortisone Q8H for three days and then double usual dose (25-30 mg/day) until I returned to [facility]. The rescue dosage worked like Pharmacist, community. I began to feel better almost immediately and now nausea, loss of appetite, dizziness with standing, chills, cold feet, and perceived cognitive difficulties have markedly improved. I cannot remember last time I felt this good!   - 08/25/17: Dr. Toni Amend: pt with possible adrenal insuff while under stress. Trial of hydrocortisone to 100mg  tid for 24-48 hours per [facility] clinic.  - 11/23/16: eval w Endocrine Dr. Tawanna Cooler [place] [facility]): due for reassessment of FT4 and IGF 1 in 4 weeks after changing dose on 10/27/16.  Pursue recs for insomnia from psych. MRI did not show signs of dementia. PFT nl. FU 6-12 mos        . Chronic fatigue fibromyalgia syndrome 01/24/2013     Priority: High     In Aug 2006 noted onset of severe fatigue and myalgias. Extensive eval for CT and autoimmune disease at [facility] / [facility]. Discovered to have inadequate thyroid replacement and low free testosterone. He was eval by endocrine ultimately dx w fibromyalgia / chronic fatigue syndrome prescribed various meds including provigil, cymbalta, gabapentin and pregabalin, and elavil (all w/o improvement).     - 01/21/20: eval w Rheum Dr. Mellody Dance - FMS - has not responded to medical therapies. No new options have come to clinical care since FDA approval of duloxetine, pregabalin, milnacripran for these symptoms Dysautonomia - commonly associated with FMS and other forms of central sensitivity state. Not known to be a cause and effect relationship.  - 10/03/18: email from Dr. Jon Billings: [facility] Labwork for paraneoplastic antibodies that can be a/w peripheral neuropathy negative.  I strongly suspect numbness in feet developed is prediabetes (A1C 6.3). Lose weight and exercise. The only lab of concern is B6 over 80. I don't feel this is causing neuropathy. Elevated B6 has been shown to cause neuropathy, primarily small fiber, in animal and likely does in pts as well.  I get concerned if level is >100, but 80s not far from that. If you are taking B6 (I.e, pyridoxine), I suggest stop. I also want to emphasize though we chased this, I don't believe this is cause (prediabetes, B6, or small fiber neuropathy) of majority of your sx. You have chronic fatigue syndrome and I would continue to advise you to seek care with specialty center for this challenging disorder.   - Skin biopsy 08/06/18: Nl epidermal nerve fiber density in two distal sites, but markedly reduced over proximal thigh. [place] red negative. SUMMARY: Unclear significance of reduced density of IENFD over proximal thigh. May reflect meralgia paresthetica, but not apparent from pt's sx. No indication of  length-dependent small fiber neuropathy on testing. If present, extremely mild and restricted to feet. If no meralgia paresthetica, could consider Sjogren's testing, if not done, and anti-Hu for non-length dependent small fiber neuropathy.  - 08/06/18: eval w Dr Bosie Clos [facility]): h/o fatigue and myalgias in 2006 after having viral prodrome continues to be c/w chronic fatigue.  Additionally, insomnia with difficulty falling sleep maintaining sleep.  He reports sleeps no more than 1-1.5 hrs at time.  This  likely contribute sig to fatigue and somnolence.  Rec trial of amitriptyline.  Amitriptyline may also benefit chronic pain.  For chronic pain/myalgias he is taking tramadol, ( which we rec stopping ) buprenorphine and clonazepam.  We advised him to minimize exposure to opiates and BZD as these can make somnolent fatigued and be factor in causing cognitive issues.  Is not clear what caused his paraphasic errors at [facility] 07/30/18, but suspect fatigue / meds played role.  He reports 1 yr of numbness and tingling in feet.  Exam sig only for mildly decreased sensation to temp bilateral feet - intact sensation pinprick.  He also reports episodes of nausea dry heaving.  To eval for possible small fiber neuropathy causing peripheral numbness or tingling and possible gastric dysmotility suggest skin bx which he elected to pursue. In addition to episodes of nausea and dry heaving.  He also reports having multiple episodes of loose stools and abd cramping.  Suspect component of IBS, but rec further eval w GI.  Plan.  For insomnia in setting of chronic fatigue -amitriptyline 10 mg at night for 1 week, then every week, increase by 10 mg to a dose of 40 mg.  Rec stopping tramadol skin biopsy for WU of small fiber neuropathy.  Consider eval at [facility] for chronic fatigue.  GI referral for eval of loose stools and cramping.  Continue w neurosurgery or [facility] for meningioma.  -  09/08/17: eval with Rheum ([facility]): A/P #1 Pituitary insufficiency  #2 Secondary AI  #3 Myalgias  #4 Sicca sx: suspicion is pt has underlying rheum process that accounts for steroid responsive sx here is exceedingly low. The constellation of sx he describes during a crisis not typical for autoimmune disease. Inflammatory myopathy would typically present with muscle weakness, and muscle strength preserved, and he is endorsing myalgia. I reviewed pic he brought in with him for episodes in hands / feet, and I am at loss. It does not look like CRPS, erythemalgia, or Raynaud's. He has had extensive testing here so far including ANA, ENAs, complements, all within nl limits as are inflammatory markers. To complete eval, I think we can check cryoglobulins, ANCAs, CCP, and pain, which seems myofascial than intra-articular or muscular, would rec obtaining x-rays of spine as well as feet ankles. I will review results on portal.       . Orthostatic hypotension 12/27/2019     Priority: Medium     - eval w [facility] NP 03/16/20: Complex symptomatic orthostatichypotension with spells of nausea and fatigue, likely related to fibromyalgia and probable dysautonomia.  He has had workups at [facility] and [facility] and sees specialists locally. Hypertension, not ideally controlled although would not be overly aggressive in lowering blood pressures due to #1. Longstanding mild AV regurgitation withamildly dilated asc aorta, stable by most recent echo Jan 2020. on statin and Zetia. Most recent LDL 76 2019. Prior partial meningioma resection in 2004. Panhypopituitarism. Hypothyroidism. Peripheral neuropathy. Cognitive issues. Fibromyalgiaandchronic fatiguesyndrome,which remainquite debilitating. REC: 1. Continue current meds. 2. Continue monitoring pressure and rate 3. Follow-up with Dr. Tami Ribas in 3 to 4 months, sooner as needed.   - eval w [facility] NP 02/05/20 Complex symptomatic orthostatichypotension,  likely related to fibromyalgia and probable dysautonomia.  Several recent changes to antihypertensive regimen with subsequent improvement both in blood pressures and symptoms. Hypertension, controlled on current regimen although still with high blood pressure spikes. Longstanding mild aortic valve regurgitation with a mildly dilated  ascending aorta, stable by most recent echocardiogram in January 2020. Hyperlipidemia, on statin and Zetia. Most recent LDL 76 in 2019. Prior partial meningioma resection in 2004. Panhypopituitarism. Hypothyroidism. Peripheral neuropathy. Cognitive issues. Fibromyalgia and chronic fatigue syndrome, which are quite debilitating. RECS: 1. Increase Toprol to 50 mg daily.  He will take at bedtime. 2. Continue remainder of cardiac medications, including higher dose of amlodipine. 3.  Continue use of thigh-high compression stockings.  These have helped significantly. 4. Continue monitoring pressure.  5. Telehealth follow-up with an APP in 3 weeks, sooner as needed.  - eval w cardiology Dr. Tami Ribas 01/16/20: 1.  Orthostasis, complex and related probably to underlying fibromyalgia and probable dysautonomia.  I explained salt loading despite hypertension may be useful to him and that 1 out of 5 patients is sodium sensitive with hypertension.  If his hypertension worsens he can of course back off.  I told him thigh-high compression stockings are more effective than anything knee-high and he will try those.  He already has rx from [facility] Bad, NP.  He is going to insist on thigh-high.  We also discussed restarting a BB but a beta 1 selective beta-blocker such as metoprolol succinate 2.  heart disease on echo 09/10/2018 he has LVH and mild aortic root dilatation. 3.  Dyslipidemia-simvastatin use. 4.  Partial resection of meningioma in 2004. 5.  Panhypopituitarism-on replacement therapy. 6.  Peripheral neuropathy and some cognitive issues. 7.  Fibromyalgia and chronic fatigue syndrome. Rec: 1.   Thigh-high compression stockings 20 to 30 mm graded 2.  Begin metoprolol succinate 25 mg daily.  We may need to increase that to twice daily ultimately. 3.  Loosen salt restriction.  He will even attempt careful salt loading. 4.  He has a follow-up visit in a couple of weeks with the APP.         [name] Irritable bowel syndrome with constipation / Cyclic vomiting syndrome 02/11/2019     Priority: Medium     - 03/02/20: eval w Dr. Jodie Echevaria: He is unable to repeat SIBO test due to family issues.  He stopped monitor antihypertensive due to this.  Nausea remains bad in morning but better in afternoon and evening.  Phenergan as needed.  This does help.  He does think amitriptyline helps sleep.  Bowel movements controlled a "life saver" on Motegrity.  Continue Phenergan Zofran and Compazine stop amitriptyline.  Option of holding TCA for now and seeing if improvement on office to do latter for now.  Continue Motegrity follow-up 3 weeks  - 01/17/20: TCA was decreased due to some orthostasis.  Linzess held and started on [employer].  He feels much better for several reasons.  The dysautonomia and hypotension correlated with nausea.  Coreg was held today no nausea but still some orthostasis but less severe.  Compression stockings and salt recommended.  Recommend start Motegrity follow-up 6 weeks  - 12/20/19: eval w Dr. Jodie Echevaria: He was continued on Linzess TCA antiemetics Protonix at last visit.  He was diagnosed with SIBO and given trial xifaxan.  He did not have much improvement with it.  He continued to have symptoms of nausea and dizziness.  Phenergan suppository.  Some orthostatic hypotension.  He does not have POTS but may be dysautonomia based on male work-up.  He does have adrenal insufficiency.  He continues to complain of loss of appetite fatigue weight loss.  He was somewhat dehydrated but able to rally and drink more.  He was having constipation and nausea so changed  the Linzess to [employer] which he finds helpful.  Continue Zofran  twice daily as needed.  Continue Compazine as needed.  He was doing well on TCA but with the orthostasis we will cut down the dose to 20 mg nightly. Stop Xifaxan and linzess - start motegrity. Start phenergan suppository.  Decreased dose TCA increase fluids recommend cardiology visit.  Follow-up 4 weeks  - [facility] / EGD 04/16/19: A few medium mouth diverticula in the sigmoid colon.  The entire colon appeared normal.  Biopsies were taken  EGD Dr Jodie Echevaria 04/16/19: Examined esophagus normal.  Biopsies taken.  Small hiatal hernia present.  Patchy mild inflammation found in gastric body in gastric antrum. PATH: Normal gastric antral mucosa negative for gastritis H. pylori herpes viral cytopathic changes intestinal metaplasia or dysplasia.  Gastric body mucosa showing mild superficial erosive injury negative for inflammation Helicobacter herpes viral cytopathic changes intestinal metaplasia or dysplasia.  GE junction biopsy: Possible reflux GE mucosa showing chronic inflammation and hyperplastic changes negative for intestinal metaplasia or dysplasia.  Mid esophagus biopsy: Esophageal candidiasis squamous mucosa showing superficial acute inflammation and intraepithelial fungal elements consistent with Candida species   - 12/18/18: eval w GI Dr. Donell Sievert: He looks better than at last visit.  The nausea reflux are better.  He may have fibromyalgia and possibly AI being managed by appropriate specialist.  He continues to struggle with fatigue.  An extensive work-up for LFTs unremarkable besides fatty liver.  He only needs pantoprazole twice a week.  He has Compazine and Phenergan for bad episodes but has not needed it.  He remains on hydrocortisone.  The fatigue continues but manageable.  Trying to get back on Provigil but awaiting approval.  No pain or changes in bowel habits.  Cramps are better.  He added a yogurt shake and fruit and prunes.  Rec continue current GI meds follow-up in 6 months  - 10/09/18 eval w GI Dr. Donell Sievert: At last visit given Phenergan and pantoprazole.  [place] with fatty liver and left renal cyst.  Labs negative HCV HBV.  Neg for other causes of abnl LFT.  He has been seen at [facility] and a nerve biopsy done with nonspecific findings.  He is watching sugar intake.  He reports less nausea less feelings of incapacitation.  He is on new pain med and this works well.  He is on Elavil at bedtime and sleeps better.  He does feel better still fatigue but try to fight through it.  He is seeing endocrinologist at [facility] and [facility].  Nausea and reflux are better with treatment.  Rec low carbohydrate diet.  Lean protein and low-fat diet.  Gave him names of local endocrinologist and rheum to consult with closer to home.  Follow-up in 3 months         . Aortic valve insufficiency 09/07/2018     Priority: Medium     - 09/07/18: eval w cardiology Dr. Harlow Asa: recommend echo, continue current antihypertensives, FU 1 year  -ECHO at [facility] 2018: described mild AR and mildly enlarged aortic root LV nl     . IFG (impaired fasting glucose) 02/13/2018     Priority: Medium     - A1C 6.2 as of 11/27/19  - A1C 6.3 09/06/18  - A1C 6.2 02/14/18 FBG 104     . History of insomnia 01/17/2017     Priority: Medium     Chronic, with previous evaluations with multiple providers    - 02/09/20:  eval w Dr. Theodoro Grist: Continues to benefit from taking melatonin and amitriptyline at night.  Occasionally he will take Ambien.  Symptoms well controlled with combination of melatonin and amitriptyline 50 mg at night.  Complete sleep study in future.  Continue modafinil follow-up 5 months  - Feb1, 2021: Eval with Dr. Eilleen Kempf: Sx well controlled with combination melatonin 10 mg and amitriptyline 50 mg at night.  Complete sleep study once pandemic has subsided.  Continue modafinil follow-up 6 months  - now seeing CBT therapist Dr. Lanier Ensign  - 10/31/16: eval w sleep medicine Dr. Ala Dach  [facility]): reviewed records from sleep study Sept 2017  and CPAP in Oct 2017 ... My opinion  his OSA is not sig enough to require effort on using CPAP consistently ... Particularly since he reports not having found CPAP helpful or restful, best to set it aside and at some point another sleep lab study and titration may be helpful.      . Growth hormone deficiency 03/29/2016     Priority: Medium     - Jan 2019: eval w Dr. Madie Reno [facility]) see recs under adrenal insufficiency in problem list  -- 10/31/16: eval w Endocrine Dr. Tawanna Cooler [place] [facility]): increase GH to 0.4 mg daily  - 08/26/16:  eval w Dr. Tawanna Cooler [place] [facility]): suspect that these issues will improve longer he is on [facility] .Marland KitchenMarland Kitchen Therefore am not concerned with him taking 7.5 mg -10 mg of pred a day or equiv if that is what he needs in next several months.          . History of meningioma of the brain 11/09/2015     Priority: Medium     12/09/02: s/p R parieto-occipital parasagittal craniotomy for 85-90% resection of R pareito-occipital falx meningioma that originally measured 61 x 24 mm    - 03/06/20: eval w Dr. Nilda Calamity: He is due for MRI for meningioma which does border on posterior cingulate cortex which is important in memory function some some concern about clinical effects of tumor progression.  His meningioma seems to be progressive in size over past year in my estimation although this is not noted on the report.  It is asymptomatic.  I would like to get a repeat scan in 6 months.  Follow-up after testing complete  - MRI 12/11/19: IMP:   Stable posterior parafalcine meningioma with continued segmental occlusion of the superior sagittal sinus.  - 09/02/19: eval w Dr. Nilda Calamity (neurology): He is due for MRI for meningioma which does border on posterior cingulate cortex is important in memory function.  Order for MRI provided if this normal I would rec repeat study in a year.  If abnormal would have him schedule for follow-up to discuss further  - 09/03/18: MRI: IMP: Modest increase in residual left parafalcine tumor  volume noted when compared back to 2018 but wo significant change when compared to 2019.  - 09/06/17: electronic eval w [name] ([facility]): Dr. Rosanne Ashing residual occipital parasagittal meningioma appears stable between 2017 - 2019 though it enlarged a little between 2008 - 2017. It is now calcifying, suggesting that further growth unlikely. I do not think it is responsible for ongoing symptoms. I would suggest follow up MRI in 2 years.         . Chronic back pain 11/09/2015     Priority: Medium     - eval w [facility] 12/02/19: Managing with Belbuca which he reports improving pain significantly.  Since initiating Belbuca  pain is a 3-4.  He has been able to [place] all other meds including tramadol and gabapentin.  - Eval with [facility] 07/12/17: Rec tramadol for severe breakthrough pain.  MRI of cervical spine ordered.  Continue at-home exercises and stretches and physical therapy.  Follow-up in 4 weeks for further evaluation  - As of May 2018 pt reports he gets pain medication and klonazepam from [facility]  - 07/01/16: note from Dr. Toni Amend:  I will no longer refill percocet, clonazepam, or provigil as no disease indications. I advised he will need  pain specialist provide. Pt has shown no e/o abuse, but has been on meds for years w no benefit. Pt currently being tx for panhypopit and AI, hypogonadism, and hypot GH deficiency. The pt knows to wean clonazepam and asked for 30 days of tramadol to wean from percocet. Note he is only on a half tablet of percocet qid. No refills will be provided. Pt aware.   - 2005: developed R hip and leg pain and was told he had "small central disk herniation" at L5-S1. Surgery not rec'd. He received PT and some epidural corticosteroid injections which improved but did not completely resolve situation. Low back and leg pain recurred in 2006 and was again tx w steroid and PT.      . Essential hypertension 01/24/2013     Priority: Medium     Treated with  amlodipine and ramipril and toprol     . Mixed hyperlipidemia 01/24/2013     Priority: Medium     Pt reports he has taken statin for many years (was taken off to see if this would help with fibromyalgia pain / wo improvement) and zetia and tricor. As of June 2019 not on tricor (concern with zetia and tricor contributing to risk of myopathy)    TC 177 TRI 237 HDL 54 LDL 76 02/14/18  TC 540 TRI 112 HDL 81 LDL 86 09/14/16          . Primary hypothyroidism 01/24/2013     Priority: Medium     Check FT4 in addition to TSH for him    - FT4 wnl as of 11/28/19  - FT4 7.4 wnl as of 09/06/18  - 03/12/18: email from endocrin Dr. Madie Reno at [facility]: " regarding thyroid replacement dose. TSH not reliable. All you really need in addition is FT4 which he states normal.. You values here in past have been around 1.5. If it is still in this range I would not change dose. Re testosterone, it would be good to get free testosterone or bioavailable testosterone along with total to be sure dose is correct. Regarding GH, if you have only been off of it for a week or two, you can start back on same dose. If it has been longer you should probably start at 0.2 mg/day for 2 weeks then increase to 0.3 mg.        . History of asthma 01/24/2013     Priority: Medium     - since childhood    - 11/23/16: eval w Dr. Renita Papa ([facility] at [facility]): bronchial asthma mild, allergic rhinitis postnasal drainage: uses albuterol BID for chest tightness. Check nitric oxide test. He has had intermittent eosinophilia and this points towards an eosinophilic asthma phenotype. He does have allergic rhinitis and think he would be better servied with our triple combination nose spray which includes mometasone, diphenhydramine and iptratroprium. I gave him rx for this.      [name]  Basal cell carcinoma (BCC) of skin of nose 01/29/2018     Priority: Low     Dx in June 2019 recommended for MOHS in July 2019     - eval w Dr. Rickard Patience 01/02/18: s/p biopsy L nasal area     . Spondylosis of  cervical region without myelopathy or radiculopathy 08/09/2017     Priority: Low     - MRI C Spine w/o contrast: C2-C3: Small posterior disc bulge partially effacing the ventral CSF and gently indenting the ventral cervical spinal cord.  There is no underlying cord signal abnormality.  There is no central canal nor is there foraminal narrowing.  C3-C4: There is a disc osteophyte complex with a superimposed posterior central disc protrusion which partially effaces the ventral CSF and indents the ventral cervical spinal cord.  There is no central canal stenosis or foraminal narrowing.  C4-C5: There is a disc osteophyte complex with prominent posterior lateral disc component within the foramina bilaterally, right greater than left.  Posterior disc bulge partially effaces the ventral CSF and indents the right ventral cervical spinal cord.  There is mild central canal stenosis.  There is no foraminal narrowing.  C5-C6: There is disc space narrowing and disc desiccation.  There is a disc osteophyte complex with prominent uncovertebral hypertrophy, right greater than left.  Posterior disc bulge partially effaces the ventral CSF.  There is no central canal stenosis.  There is mild to moderate right-sided foraminal encroachment.  The left foramen is patent.  C6-C7: There is disc space narrowing and disc desiccation.  There is a disc osteophyte complex with right posterior lateral disc herniation partially effacing the ventral CSF and mildly flattening.  The underlying cervical spinal cord.  There is marked right-sided foraminal encroachment as well as mild left-sided foraminal narrowing.  There is facet arthropathy.  C7-T1: There is no evidence of central canal or foraminal stenosis.  Overall impression: Multilevel cervical spondylosis as described in detail above.  - evaluation w [facility] Dr. Penni Bombard Nov 2018     . Amnestic MCI (mild cognitive impairment with memory loss) 11/20/2016     Priority: Low     - had  dizziness on donepezil and remeron - Everson'd both -     - 04/17/19: eval w Neurology Dr. Nilda Calamity: Multiple medical problems and reduced quality of life due to chronic pain insomnia and fatigue and memory concerns.  He would like to try a new possible tx for chronic fatigue and fibromyalgia involving treatment of possible viral pathogens.  I told him I am not comfortable treating him this way with famciclovir and Celebrex but I rec he see another physician interest in fibromyalgia and chronic fatigue as well as possible viral connections with conditions.  Referral made.  I will continue to follow for memory concerns and meningioma.  I have no suspicion for Alzheimer's.  The meningioma does border on posterior cingulate cortex which is important in memory function and will be monitored periodically.  He sees Dr. Theodoro Grist for insomnia and daytime sleepiness.  I will see him again virtually in a few months  - 11/27/18: eval w Neurology Dr. Nilda Calamity: Multiple medical problems and reduced quality of life due to chronic pain insomnia and fatigue with more recent concerns about memory.  At this point based on history and exam as well as review of records I have no reason to suspect Alzheimer's type dementia.  The meningioma is bordering on aspects of the posterior cingulate cortex  which is important in memory function so this is potentially a cause.  However I am skeptical of the MCI diagnosis and I would think that abbreviated follow-up testing is important.  He has severe daytime fatigue and sleepiness and unrefreshing sleep.  He is seeing Dr. Theodoro Grist for further advice on this.  I will prescribe Provigil for his daytime fatigue as I intended to previously.  His meningioma needs continual follow-up.  Although there was no obvious growth with the recent exam I did feel that the meningioma seemed a bit more robust and greater in volume than it did in 2017 but this may have been due to the greater degree of gadolinium uptake which was  probably a technical issue.  His understanding was that the meningioma was calcified and unlikely to grow.       . Hemianopia, homonymous, left 09/15/2015     Priority: Low     - 09/04/17: eval w Dr. Jerre Simon [facility]): He has a L homonymous hemianopia from prior occipital lobe meningioma and resection. I will get baseline visual field since last exam 2 years ago. He reports dry eye. Prior SSA and SSB were negative. Schirmer's test showed normal tear production. The dry eye is mostly from meibomian gland dysfunction and therefore unrelated to Sjogren's. His main visual complaint is blurred vision and tunnel vision during "spells" that he attributes to low cortisol. He is currently being tx with cortisone and has minimal sx. A lack of cortisone would not typically cause visual sx, but if blood pressure dropping, this could be contributing. He is scheduled for autonomic testing later this week. Addendum: 24-2 Humphrey visual field showed a small left inferior homonymous scotoma     . Secondary male hypogonadism 01/24/2013     Priority: Low     - - 10/31/16: eval w Endocrine Dr. Tawanna Cooler [facility]): testosterone level is excellent right now - continue current therapy         . Marijuana use 07/12/2018     Did not help     . Onychomycosis 02/13/2018   . Xerostomia 12/21/2017     eval w dentist in April 2019 and recommended for tx w evoxac     . Left testicular pain 09/13/2017     09/07/17 evaluation w Urology at [facility]: PLAN: 1 Left Testicular Pain We discussed current symptomatology. We discussed results of ultrasound which does not note concerning pathology to account for left-sided testicular pain. He has noted improvement with use of scrotal support. I did discuss with him consideration of referral to our PMR colleagues, potential PT for pelvic floor assessment or question as to whether nerve block would be appropriate. At this point in time the patient would like to defer anything invasive and is reassured with negative  imaging.             . Recurrent sinusitis 01/17/2017     - 11/21/16: eval w ENT Dr. Jannette Spanner [facility]): For recurrent sinusitis, he is on good medical regiment w daily nasal irrigations. Surgery would be indicated for 4-5 episodes of sinusitis per year. He was not interested in operative intervention and this is reasonable. In re to his deviated septum septoplasty could be considered. He was not interested at this time. To address his nasal obstruction on a mucosal basis I recommended triple spray. Avoid afrin. Offered him fluoroscopic eval of his dysphagia but he deferred. Would not recommend surgery for OSA.      [name] BPH (benign prostatic hyperplasia)  01/24/2013     As of 11/13/19 pt noting diminished urinary flow - req trial of flomax 0.4 mg qhs -        The patient's past medical history, surgical history, medications and allergies were reviewed.    Allergies   Allergen Reactions   . Bextra [Valdecoxib] Rash       Medications  Current Outpatient Medications   Medication Sig Dispense Refill   . albuterol (PROAIR HFA) 108 (90 Base) MCG/ACT inhaler INHALE ONE PUFF BY MOUTH EVERY FOUR TO SIX HOURS AS NEEDED FOR WHEEZING (Patient taking differently: 2 puffs 2 (two) times daily INHALE ONE PUFF BY MOUTH EVERY FOUR TO SIX HOURS AS NEEDED FOR WHEEZING  ) 25.5 g 1   . amLODIPine (NORVASC) 5 MG tablet Take 1 tablet (5 mg total) by mouth 2 (two) times daily 180 tablet 3   . BELBUCA 450 MCG Film 2 (two) times daily        . Calcium Carbonate-Vitamin D (CALCIUM + D PO) Take by mouth. 500/400mg   daily      . clonazePAM (KLONOPIN) 0.5 MG tablet Take 0.5 mg by mouth daily as needed     2   . docusate sodium (Colace) 100 MG capsule Take 100 mg by mouth 2 (two) times daily     . ezetimibe (ZETIA) 10 MG tablet Take 1 tablet (10 mg total) by mouth daily 90 tablet 3   . finasteride (PROSCAR) 5 MG tablet Take 1 tablet (5 mg total) by mouth nightly 90 tablet 3   . finasteride 2.5 mg Tab Take 5 mg by mouth     . hydrocortisone (CORTEF) 20 MG  tablet Take 1 tablet (20 mg total) by mouth daily Take 4 PO TID F3 (Patient taking differently: Take 20 mg by mouth daily   ) 90 tablet 1   . levothyroxine (SYNTHROID) 150 MCG tablet TAKE ONE TABLET BY MOUTH ONE TIME DAILY AT 6 PM 90 tablet 0   . metoprolol succinate XL (Toprol XL) 25 MG 24 hr tablet Take 1 tablet (25 mg total) by mouth nightly 30 tablet 0   . modafinil (PROVIGIL) 200 MG tablet Take 200 mg by mouth daily PRN       . montelukast (SINGULAIR) 10 MG tablet Take one tablet by mouth at bedtime as directed 90 tablet 0   . Motegrity 2 MG Tab Take 1 tablet by mouth daily     . ondansetron (ZOFRAN-ODT) 4 MG disintegrating tablet 4 mg as needed     . pantoprazole (PROTONIX) 40 MG tablet Take 40 mg by mouth daily     . pilocarpine (SALAGEN) 5 MG tablet Take 1 tablet (5 mg total) by mouth 3 (three) times daily as needed (dry mouth) 270 tablet 0   . prochlorperazine (COMPAZINE) 10 MG tablet Take 5 mg by mouth as needed        . promethazine (PHENERGAN) 25 MG suppository every 6 (six) hours as needed        . psyllium (Metamucil) 0.52 g capsule Take 1.6 g by mouth 2 (two) times daily     . ramipril (ALTACE) 10 MG capsule Take 2 capsules (20 mg total) by mouth daily 180 capsule 1   . senna (SENOKOT) 8.6 MG tablet Take 17.2 mg by mouth 2 (two) times daily     . simvastatin (ZOCOR) 20 MG tablet Take 1 tablet (20 mg total) by mouth nightly 90 tablet 1   . SYMBICORT 80-4.5 MCG/ACT inhaler Inhale  2 puffs into the lungs two times daily (Patient taking differently: 2 puffs 2 (two) times daily Have patient rinse mouth after use  ) 10.2 g 3   . tamsulosin (FLOMAX) 0.4 MG Cap Take 1 capsule (0.4 mg total) by mouth Daily after dinner 90 capsule 0   . Testosterone 20.25 MG/ACT (1.62%) Gel Apply 4 actuations daily (2 on each shoulder) 300 g 2   . zolpidem (AMBIEN) 10 MG tablet Take 1 tablet (10 mg total) by mouth nightly as needed for Sleep 20 tablet 0   . AMITRIPTYLINE HCL PO Take 20 mg by mouth nightly        . cyanocobalamin  1000 MCG tablet every 24 hours       No current facility-administered medications for this visit.     Review of Systems  CONST: No weight change, no new or worsening fevers/chills sweats, no new or worsening fatigue, muscle aches  CV: No other chest pain, palpitations, leg swelling  RESP: No other new shortness of breath, cough, wheeze, asthma  GI: No new or worsening n/v, diarrhea, constipation, abd pain, heartburn, blood in stool  NEURO:  No new headache, lightheadedness, dizziness, loss of consciousness, numbness/tingling, weakness    Physical Exam  BP 127/63 (BP Site: Left arm, Patient Position: Sitting, Cuff Size: Medium)   Pulse 68   Temp 98.7 F (37.1 C) (Oral)   Resp 14   Wt 82.9 kg (182 lb 12.8 oz)   SpO2 97%   BMI 26.99 kg/m   Wt Readings from Last 3 Encounters:   03/25/20 82.9 kg (182 lb 12.8 oz)   03/16/20 79.8 kg (176 lb)   02/05/20 79.4 kg (175 lb)     GEN: Well developed well nourished male alert and appropriate in no apparent distress  CV: regular rate and rhythm no m/r/g.   2+ radial pulses present and equal.  RESP: clear to auscultation bilaterally, normal effort, no rhonchi or rales  Chest wall: Minimal tenderness mid axillary line 2 to 3 inches below the nipple -normal expansion    Assessment/Plan    1. Rib pain on right side  Recommend plain film of the chest / ribs.   - XR Ribs Right W PA Chest    2.  Chronic fatigue fibromyalgia syndrome: I am not aware of any definitive data that suggest antivirals could be effective in treating his chronic fatigue and fibromyalgia symptoms though obviously something like Valtrex would seem to be low risk with low risk of side effects.  I emailed him the current literature on treatment of fibromyalgia and much of this he has already tried in terms of medications and other interventions  - he is going to discuss antiviral therapy specifically with his infectious disease physician.     Orders Placed This Encounter   Procedures   . XR Ribs Right W PA  Chest     Gloriajean Dell, MD  [facility]  [address]  [place], [place] [address]  P) [phone]  F) [phone]  [email or web address]

## 2020-03-25 NOTE — Telephone Encounter (Signed)
Patient calls with a complaint of right rib cage pain X 3 days.  Denies any injury or trauma.  Appointment made for 2pm today.

## 2020-03-30 ENCOUNTER — Other Ambulatory Visit: Payer: Self-pay | Admitting: Internal Medicine

## 2020-03-30 DIAGNOSIS — R3912 Poor urinary stream: Secondary | ICD-10-CM

## 2020-03-30 DIAGNOSIS — N401 Enlarged prostate with lower urinary tract symptoms: Secondary | ICD-10-CM

## 2020-03-30 MED ORDER — TAMSULOSIN HCL 0.4 MG PO CAPS
0.4000 mg | ORAL_CAPSULE | Freq: Every day | ORAL | 3 refills | Status: DC
Start: 2020-03-30 — End: 2021-04-09

## 2020-04-09 ENCOUNTER — Emergency Department
Admission: EM | Admit: 2020-04-09 | Discharge: 2020-04-09 | Disposition: A | Discharge status: Home / Self Care | Payer: Medicare Other | Attending: Emergency Medical Services | Admitting: Emergency Medical Services

## 2020-04-09 ENCOUNTER — Telehealth: Payer: Self-pay | Admitting: Family Medicine

## 2020-04-09 DIAGNOSIS — E039 Hypothyroidism, unspecified: Secondary | ICD-10-CM | POA: Insufficient documentation

## 2020-04-09 DIAGNOSIS — I10 Essential (primary) hypertension: Secondary | ICD-10-CM | POA: Insufficient documentation

## 2020-04-09 DIAGNOSIS — Z8639 Personal history of other endocrine, nutritional and metabolic disease: Secondary | ICD-10-CM | POA: Insufficient documentation

## 2020-04-09 DIAGNOSIS — R1115 Cyclical vomiting syndrome unrelated to migraine: Secondary | ICD-10-CM | POA: Insufficient documentation

## 2020-04-09 DIAGNOSIS — E785 Hyperlipidemia, unspecified: Secondary | ICD-10-CM | POA: Insufficient documentation

## 2020-04-09 DIAGNOSIS — Z86011 Personal history of benign neoplasm of the brain: Secondary | ICD-10-CM | POA: Insufficient documentation

## 2020-04-09 LAB — URINALYSIS REFLEX TO MICROSCOPIC EXAM - REFLEX TO CULTURE
Bilirubin, UA: NEGATIVE
Glucose, UA: NEGATIVE
Ketones UA: NEGATIVE
Leukocyte Esterase, UA: NEGATIVE
Nitrite, UA: NEGATIVE
Protein, UR: NEGATIVE
Specific Gravity UA: 1.013 (ref 1.001–1.035)
Urine pH: 6 (ref 5.0–8.0)
Urobilinogen, UA: NORMAL mg/dL (ref 0.2–2.0)

## 2020-04-09 LAB — COMPREHENSIVE METABOLIC PANEL
ALT: 21 U/L (ref 0–55)
AST (SGOT): 16 U/L (ref 5–34)
Albumin/Globulin Ratio: 1.5 (ref 0.9–2.2)
Albumin: 3.5 g/dL (ref 3.5–5.0)
Alkaline Phosphatase: 61 U/L (ref 38–106)
Anion Gap: 9 (ref 5.0–15.0)
BUN: 25 mg/dL (ref 9–28)
Bilirubin, Total: 0.5 mg/dL (ref 0.2–1.2)
CO2: 25 mEq/L (ref 22–29)
Calcium: 9.1 mg/dL (ref 7.9–10.2)
Chloride: 105 mEq/L (ref 100–111)
Creatinine: 1.2 mg/dL (ref 0.7–1.3)
Globulin: 2.4 g/dL (ref 2.0–3.6)
Glucose: 106 mg/dL — ABNORMAL HIGH (ref 70–100)
Potassium: 4 mEq/L (ref 3.5–5.1)
Protein, Total: 5.9 g/dL — ABNORMAL LOW (ref 6.0–8.3)
Sodium: 139 mEq/L (ref 136–145)

## 2020-04-09 LAB — CBC AND DIFFERENTIAL
Absolute NRBC: 0 10*3/uL (ref 0.00–0.00)
Basophils Absolute Automated: 0.06 10*3/uL (ref 0.00–0.08)
Basophils Automated: 0.7 %
Eosinophils Absolute Automated: 0.2 10*3/uL (ref 0.00–0.44)
Eosinophils Automated: 2.3 %
Hematocrit: 38.1 % (ref 37.6–49.6)
Hgb: 13.1 g/dL (ref 12.5–17.1)
Immature Granulocytes Absolute: 0.07 10*3/uL (ref 0.00–0.07)
Immature Granulocytes: 0.8 %
Lymphocytes Absolute Automated: 1.25 10*3/uL (ref 0.42–3.22)
Lymphocytes Automated: 14.2 %
MCH: 31.1 pg (ref 25.1–33.5)
MCHC: 34.4 g/dL (ref 31.5–35.8)
MCV: 90.5 fL (ref 78.0–96.0)
MPV: 10.9 fL (ref 8.9–12.5)
Monocytes Absolute Automated: 1.21 10*3/uL — ABNORMAL HIGH (ref 0.21–0.85)
Monocytes: 13.8 %
Neutrophils Absolute: 5.99 10*3/uL (ref 1.10–6.33)
Neutrophils: 68.2 %
Nucleated RBC: 0 /100 WBC (ref 0.0–0.0)
Platelets: 184 10*3/uL (ref 142–346)
RBC: 4.21 10*6/uL (ref 4.20–5.90)
RDW: 12 % (ref 11–15)
WBC: 8.78 10*3/uL (ref 3.10–9.50)

## 2020-04-09 LAB — LIPASE: Lipase: 42 U/L (ref 8–78)

## 2020-04-09 LAB — GFR: EGFR: 59.7

## 2020-04-09 MED ORDER — ONDANSETRON HCL 4 MG/2ML IJ SOLN
4.0000 mg | Freq: Once | INTRAMUSCULAR | Status: AC
Start: 2020-04-09 — End: 2020-04-09
  Administered 2020-04-09: 4 mg via INTRAVENOUS
  Filled 2020-04-09: qty 2

## 2020-04-09 MED ORDER — PROCHLORPERAZINE EDISYLATE 10 MG/2ML IJ SOLN
10.0000 mg | Freq: Once | INTRAMUSCULAR | Status: AC
Start: 2020-04-09 — End: 2020-04-09
  Administered 2020-04-09: 10 mg via INTRAVENOUS
  Filled 2020-04-09: qty 2

## 2020-04-09 MED ORDER — SODIUM CHLORIDE 0.9 % IV BOLUS
1000.0000 mL | Freq: Once | INTRAVENOUS | Status: AC
Start: 2020-04-09 — End: 2020-04-09
  Administered 2020-04-09: 1000 mL via INTRAVENOUS

## 2020-04-09 NOTE — ED Notes (Signed)
Rn assisted with disconnection with IV so pt could self ambulate to the bathroom. Upon return this RN assisted with reconnecting the IV, repositioning the pt in bed, and offering blankets for comfort

## 2020-04-09 NOTE — Telephone Encounter (Signed)
Please see how Dr [name] is feeling after recent ER visit. Thank you

## 2020-04-09 NOTE — ED Triage Notes (Signed)
Pt states this is similar to previous incidences. Treatment has been IV fluids & IV zofran with significant relief of symptoms.

## 2020-04-09 NOTE — ED Provider Notes (Signed)
EMERGENCY DEPARTMENT HISTORY AND PHYSICAL EXAM         [facility]        Patient Information       Patient Name: Shannon Neighbor, Shannon [name]  Encounter Date:  04/09/2020  Attending Physician: Alexander-Nicholas D. Terance Ice, Shannon [name] FACEP  Patient DOB:  June 05, 1951  MRN:  [id number]  Room:  04/A04      History of Presenting Illness     Chief Complaint:   Chief Complaint   Patient presents with   . Nausea   . Emesis            Historian:  patient    69 y.o. male with a history of meningioma status post right parietal-occipital parasagittal craniotomy for resection of this meningioma in 2004, with brain MRI April of this year showing recurrent but stable tumor, also history of secondary adrenal insufficiency who is chronically on hydrocortisone 20 mg daily, who has episodes of intractable nausea.  He notes that he almost always has some baseline level of nausea of unclear etiology and that every few weeks has exacerbations that last several days or more.  He has prescriptions for Zofran and Phenergan and has had progressive nausea for the last 6 days.  He felt like there was some improvement on Monday so Tuesday he had his but during COVID-19 third/booster shot.  Over the last 24 hours his nausea has returned he has had low-grade temperatures without shaking chills or rigors.  He is able to tolerate some p.o. liquids and solids and notes that his urine does not look concentrated.  He denies headache, visual disturbance, abdominal pain, diarrhea.  His nausea is getting worse so he is presented to the ER for further management.  He notes "no one can figure out my nausea and dizziness".  Review of the epic record shows that patient has had multiple evaluations over the years.  Including GI evaluation for irritable bowel syndrome with constipation and cyclic vomiting syndrome.  He also is being managed for chronic fatigue fibromyalgia syndrome.  And also secondary adrenal insufficiency.  In the past he has  responded well to short burst of steroid (for example 100 mg of hydrocortisone 3 times daily for 2 to 3 days) but notes that this no longer helps.        PMD:  Modesto Charon, Shannon [name]      Nursing Notes Review   Nursing Notes were reviewed.     Previous Records Review   Previous records were reviewed to the extent practicable for the current presentation.      Review of Systems     Constitutional:  No fever  Eyes: No discharge   ENT: No ST  CV:  No CP   Resp:  No SOB or cough  GI: No abd pain, D; +nausea  GU: No dysuria  MS:    Skin: No rash  Neuro:  No HA  Psych:  No behavior changes  All other systems reviewed and negative    See HPI for review of systems that is relevant to the current presentation.   All other systems reviewed: negative.     Past Medical History     Past Medical History:   Diagnosis Date   . Aortic regurgitation    . Ascending aortic aneurysm 09/19/2018    4 cm ECHO   . Avascular necrosis 2008    R hip - did not require surgery   . Bilateral cataracts  budding   . Brain tumor 2003    Taken out in 2003, has returned    . Calculus of kidney    . Cold intolerance    . Color blindness    . Disorder of prostate     BPH   . DJD (degenerative joint disease)     Lumbar / Sacral   . Dry eyes     uses restasis eyedrops which helps (RF, CCP, CRP and ESR have been wnl in past)   . Dysautonomia    . Eczema 03/29/2016    Right thumb    . Encounter for blood transfusion 2004   . Encounter for hepatitis C screening test for low risk patient 08/2016    negative   . Encounter for pharmacogenetic testing 10/2015    MEDIMAP   . Fibromyalgia    . Gastroesophageal reflux disease    . Hemorrhoids    . Hyperlipidemia    . Hypertension    . Hypothyroidism    . Lateral epicondylitis     Left   . Malignant neoplasm of skin    . Mitral valve insufficiency    . Osteoporosis    . Peripheral neuropathy 12/26/2019   . Screening PSA (prostate specific antigen) 02/14/2018    0.4   . Sleep apnea     mild, no cpap   . Tinnitus      high pitched buzzing comes and goes - started after craniotomy for meningioma       Past Surgical History     Past Surgical History:   Procedure Laterality Date   . COLONOSCOPY  2008   . ECHOCARDIOGRAM, TRANSTHORACIC  04/29/2005    63   . ECHOCARDIOGRAM, TRANSTHORACIC  10/17/2016    63   . EGD, COLONOSCOPY N/A 04/16/2019    Procedure: EGD, COLONOSCOPY;  Surgeon: Leatha Gilding, Shannon [name];  Location: [facility];  Service: Gastroenterology;  Laterality: N/A;  egd, colonoscopy  q1-unk, Shannon [name] req 45 min   . EXCISION, LESION  12/03/2008    benign lesion   . ORCHIOPEXY Left 1980    for intermittent torsion   . Subtotal resection of posterior sagittal meningioma  12/05/2002    Partial brain resection secondary to meningioma at [facility]   . TONSILLECTOMY AND ADENOIDECTOMY  as a child   . TOOTH EXTRACTION Right 06/2018       Family History     Family History   Problem Relation Age of Onset   . Asthma Daughter    . Tuberculosis Mother    . Dementia Mother    . Migraines Sister        Social History     He reports that he has never smoked. He has never used smokeless tobacco. He reports current drug use. He reports that he does not drink alcohol.    Family History   Problem Relation Age of Onset   . Asthma Daughter    . Tuberculosis Mother    . Dementia Mother    . Migraines Sister        Allergies     Allergies   Allergen Reactions   . Bextra [Valdecoxib] Rash       Home Medications     Home medications reviewed by ED Shannon [name] at 5:40 AM    Discharge Medication List as of 04/09/2020  6:59 AM      CONTINUE these medications which have NOT CHANGED    Details   albuterol (PROAIR HFA) 108 (  90 Base) MCG/ACT inhaler INHALE ONE PUFF BY MOUTH EVERY FOUR TO SIX HOURS AS NEEDED FOR WHEEZING, Normal      AMITRIPTYLINE HCL PO Take 20 mg by mouth nightly   , Starting Wed 01/23/2019, Historical Med      amLODIPine (NORVASC) 5 MG tablet Take 1 tablet (5 mg total) by mouth 2 (two) times daily, Starting Mon 03/16/2020, E-Rx      BELBUCA 450 MCG Film 2 (two)  times daily   , Starting Wed 07/11/2018, Historical Med      Calcium Carbonate-Vitamin D (CALCIUM + D PO) Take by mouth. 500/400mg   daily , Until Discontinued, Historical Med      clonazePAM (KLONOPIN) 0.5 MG tablet Take 0.5 mg by mouth daily as needed   , Starting Fri 11/25/2016, Historical Med      cyanocobalamin 1000 MCG tablet every 24 hours, Historical Med      docusate sodium (Colace) 100 MG capsule Take 100 mg by mouth 2 (two) times daily, Starting Mon 03/25/2019, Historical Med      ezetimibe (ZETIA) 10 MG tablet Take 1 tablet (10 mg total) by mouth daily, Starting Tue 11/05/2019, Normal      !! finasteride (PROSCAR) 5 MG tablet Take 1 tablet (5 mg total) by mouth nightly, Starting Mon 02/03/2020, Normal      !! finasteride 2.5 mg Tab Take 5 mg by mouth, Historical Med      hydrocortisone (CORTEF) 20 MG tablet Take 1 tablet (20 mg total) by mouth daily Take 4 PO TID F3, Starting Mon 12/23/2019, E-Rx      levothyroxine (SYNTHROID) 150 MCG tablet TAKE ONE TABLET BY MOUTH ONE TIME DAILY AT 6 PM, E-Rx      metoprolol succinate XL (Toprol XL) 25 MG 24 hr tablet Take 1 tablet (25 mg total) by mouth nightly, Starting Mon 03/16/2020, No Print      modafinil (PROVIGIL) 200 MG tablet Take 200 mg by mouth daily PRN  , Starting Thu 12/27/2018, Historical Med      montelukast (SINGULAIR) 10 MG tablet Take one tablet by mouth at bedtime as directed, Normal      Motegrity 2 MG Tab Take 1 tablet by mouth daily, Starting Wed 01/29/2020, Historical Med      ondansetron (ZOFRAN-ODT) 4 MG disintegrating tablet 4 mg as needed, Starting Mon 07/15/2019, Historical Med      pantoprazole (PROTONIX) 40 MG tablet Take 40 mg by mouth daily, Historical Med      pilocarpine (SALAGEN) 5 MG tablet Take 1 tablet (5 mg total) by mouth 3 (three) times daily as needed (dry mouth), Starting Wed 03/04/2020, E-Rx      prochlorperazine (COMPAZINE) 10 MG tablet Take 5 mg by mouth as needed   , Starting Wed 10/30/2019, Historical Med      promethazine (PHENERGAN)  25 MG suppository every 6 (six) hours as needed   , Starting Wed 06/26/2019, Historical Med      psyllium (Metamucil) 0.52 g capsule Take 1.6 g by mouth 2 (two) times daily, Starting Mon 03/25/2019, Historical Med      ramipril (ALTACE) 10 MG capsule Take 2 capsules (20 mg total) by mouth daily, Starting Mon 02/03/2020, E-Rx      senna (SENOKOT) 8.6 MG tablet Take 17.2 mg by mouth 2 (two) times daily, Starting Mon 03/25/2019, Historical Med      simvastatin (ZOCOR) 20 MG tablet Take 1 tablet (20 mg total) by mouth nightly, Starting Mon 02/17/2020, Normal  SYMBICORT 80-4.5 MCG/ACT inhaler Inhale 2 puffs into the lungs two times daily, Normal      tamsulosin (FLOMAX) 0.4 MG Cap Take 1 capsule (0.4 mg total) by mouth Daily after dinner, Starting Mon 03/30/2020, Normal      Testosterone 20.25 MG/ACT (1.62%) Gel Apply 4 actuations daily (2 on each shoulder), Print      zolpidem (AMBIEN) 10 MG tablet Take 1 tablet (10 mg total) by mouth nightly as needed for Sleep, Starting Fri 11/29/2019, E-Rx       !! - Potential duplicate medications found. Please discuss with provider.          ED Medications Administered     ED Medication Orders (From admission, onward)    Start Ordered     Status Ordering Provider    04/09/20 0705 04/09/20 0704  ondansetron (ZOFRAN) injection 4 mg  Once     Route: Intravenous  Ordered Dose: 4 mg     Last MAR action: Given Glendia Olshefski, ALEXANDER-NICHOLAS D    04/09/20 0635 04/09/20 0634  prochlorperazine (COMPAZINE) injection 10 mg  Once     Route: Intravenous  Ordered Dose: 10 mg     Last MAR action: Given Alanah Sakuma, ALEXANDER-NICHOLAS D    04/09/20 0540 04/09/20 0539  sodium chloride 0.9 % bolus 1,000 mL  Once     Route: Intravenous  Ordered Dose: 1,000 mL     Last MAR action: Stopped Waverly Tarquinio, ALEXANDER-NICHOLAS D    04/09/20 0540 04/09/20 0539  ondansetron (ZOFRAN) injection 4 mg  Once     Route: Intravenous  Ordered Dose: 4 mg     Last MAR action: Given Jamilet Ambroise, ALEXANDER-NICHOLAS D            VS      Patient Vitals for the past 24 hrs:   BP Temp Temp src Pulse Resp SpO2 Height Weight   04/09/20 0807 - - - - 16 - - -   04/09/20 0710 154/74 98.3 F (36.8 C) Oral (!) 58 - 96 % - -   04/09/20 0513 133/73 100.4 F (38 C) Oral 71 - 95 % - -   04/09/20 0506 - - - - - - 5\' 9"  (1.753 m) 79.4 kg       Physical Exam     Constitutional: Vital signs reviewed. Nontoxic, no distress.  Ill appearing.  Head: Normocephalic, atraumatic  Eyes: Conjunctiva and sclera are normal.  No injection or discharge. PERL, EOMI  Ears, Nose, Throat:  Normal external examination of the nose and ears.    Neck: Normal range of motion. Trachea midline. JVD flat  Respiratory/Chest:  No respiratory distress.   Cardiovascular: Regular rate and rhythm.   Abdomen: Soft and non-tender. No masses.  No rebound or guarding.  Back:     Upper Extremity:  No edema. No cyanosis.  Lower Extremity:  No edema. No cyanosis.  Neurological:  Alert and conversant.  No focal motor deficits by observation; MAE, 5/5 strength x4 ext. Speech normal.  Skin: Warm and dry. No rash.  Psychiatric:  Normal affect.  Normal insight.        Diagnostic Study Results     ECG:      Labs     Results     Procedure Component Value Units Date/Time    Urinalysis Reflex to Microscopic Exam- Reflex to Culture [[id number]]  (Abnormal) Collected: 04/09/20 0645    Specimen: Urine, Clean Catch Updated: 04/09/20 0715     Urine Type Urine, Clean Ca  Color, UA Yellow     Clarity, UA Clear     Specific Gravity UA 1.013     Urine pH 6.0     Leukocyte Esterase, UA Negative     Nitrite, UA Negative     Protein, UR Negative     Glucose, UA Negative     Ketones UA Negative     Urobilinogen, UA Normal mg/dL      Bilirubin, UA Negative     Blood, UA Small     RBC, UA 0 - 2 /hpf      WBC, UA 0 - 5 /hpf      Squamous Epithelial Cells, Urine 0 - 5 /hpf     Comprehensive metabolic panel [[id number]]  (Abnormal) Collected: 04/09/20 0600    Specimen: Blood Updated: 04/09/20 0630     Glucose 106 mg/dL      BUN  25 mg/dL      Creatinine 1.2 mg/dL      Sodium 409 mEq/L      Potassium 4.0 mEq/L      Chloride 105 mEq/L      CO2 25 mEq/L      Calcium 9.1 mg/dL      Protein, Total 5.9 g/dL      Albumin 3.5 g/dL      AST (SGOT) 16 U/L      ALT 21 U/L      Alkaline Phosphatase 61 U/L      Bilirubin, Total 0.5 mg/dL      Globulin 2.4 g/dL      Albumin/Globulin Ratio 1.5     Anion Gap 9.0    Lipase [[id number]] Collected: 04/09/20 0600    Specimen: Blood Updated: 04/09/20 0630     Lipase 42 U/L     GFR [[id number]] Collected: 04/09/20 0600     Updated: 04/09/20 0630     EGFR 59.7    CBC and differential [[id number]]  (Abnormal) Collected: 04/09/20 0600    Specimen: Blood Updated: 04/09/20 0605     WBC 8.78 x10 3/uL      Hgb 13.1 g/dL      Hematocrit 52.8 %      Platelets 184 x10 3/uL      RBC 4.21 x10 6/uL      MCV 90.5 fL      MCH 31.1 pg      MCHC 34.4 g/dL      RDW 12 %      MPV 10.9 fL      Neutrophils 68.2 %      Lymphocytes Automated 14.2 %      Monocytes 13.8 %      Eosinophils Automated 2.3 %      Basophils Automated 0.7 %      Immature Granulocytes 0.8 %      Nucleated RBC 0.0 /100 WBC      Neutrophils Absolute 5.99 x10 3/uL      Lymphocytes Absolute Automated 1.25 x10 3/uL      Monocytes Absolute Automated 1.21 x10 3/uL      Eosinophils Absolute Automated 0.20 x10 3/uL      Basophils Absolute Automated 0.06 x10 3/uL      Immature Granulocytes Absolute 0.07 x10 3/uL      Absolute NRBC 0.00 x10 3/uL           Radiologic Studies  Radiology Results (24 Hour)     ** No results found for the last 24 hours. **      .  Data Review     Laboratory results reviewed by ED provider:  Yes  Radiologic study results reviewed by ED provider:  N/A    MDM and Clinical Notes     The patient was seen and evaluated during the time of COVID-19 pandemic.  Significant limitations can be present in the evaluation and management of ED patients during pandemic conditions, including but not limited to lack of testing, rapidly changing IHS protocols,  limited evaluation and follow-up resources.       Patient with longstanding history of intractable nausea vomiting of unclear etiology, possible IBS with cyclic vomiting and also secondary AI.  Patient notes stress of steroids have not helped.  Cannot identify any recent stressors.  Did have Covid booster a couple days ago but onset of his nausea exacerbation was actually prior to this.  He notes that IV Zofran helps better than Zofran ODT.  Clinically I do not believe he is significantly dehydrated.  Will check labs.    0630: 30 minutes after IV Zofran patient notes that he is starting to feel improved but continues to feel nauseated.  We have collectively decided to administer 10 mg of Compazine to see if we can break the cycle of nausea.  Patient has not had dystonic reactions to this medication.  Hemodynamically stable.  No sepsis physiology.  Abdomen benign.  Getting IV fluids.    0730: Patient improved again and desires to be discharged home.    Critical Care     na      Diagnosis and Disposition     Rendering Provider: Alexander-Nicholas D Fannie Alomar    Clinical Impression(s):  1. Intractable cyclical vomiting with nausea    2. History of adrenal insufficiency    3. History of meningioma of the brain        Disposition  ED Disposition     ED Disposition Condition Date/Time Comment    Discharge  Thu Apr 09, 2020  7:44 AM Aahil Enid Derry, Shannon [name] discharge to home/self care.    Condition at disposition: Stable          Prescriptions    Discharge Medication List as of 04/09/2020  6:59 AM            *This note was generated by the Epic EMR system/ Dragon speech recognition and may contain inherent errors or omissions not intended by the user. Grammatical errors, random word insertions, deletions, pronoun errors and incomplete sentences are occasional consequences of this technology due to software limitations. Not all errors are caught or corrected. If there are questions or concerns about the content of this note or  information contained within the body of this dictation they should be addressed directly with the author for clarification.Terance Ice, Alexander-Nicholas D, Shannon [name]  04/09/20 [phone]

## 2020-04-09 NOTE — Discharge Instructions (Signed)
Thank you for choosing [facility] for your emergency care needs.  We strive to provide EXCELLENT care to you and your family.      If you do not continue to improve or your condition worsens, please contact your doctor or return immediately to the Emergency Department.    ONSITE PHARMACY  Our full service onsite pharmacy is a 2 minute walk from the ER.  Open Mon to Fri from 8 am to 8 pm, Sat and Sun 9 am to 5 pm. Ask an ED staff member for directions.  We accept all major insurances and prices are competitive with major retailers.  Ask your provider to print your prescriptions down to the pharmacy to speed you on your way home.      DOCTOR REFERRALS  Call [phone] (available 24 hours a day, 7 days a week) if you need any further referrals and we can help you find a primary care doctor or specialist.  Also, available online at:  [email or web address]    YOUR CONTACT INFORMATION  Before leaving please check with registration to make sure we have an up-to-date contact number.  You can call registration at [phone] to update your information.  For questions about your hospital bill, please call [phone].  For questions about your Emergency Dept Physician bill please call [phone].      FREE HEALTH SERVICES  If you need help with health or social services, please call 2-1-1 for a free referral to resources in your area.  2-1-1 is a free service connecting people with information on health insurance, free clinics, pregnancy, mental health, dental care, food assistance, housing, and substance abuse counseling.  Also, available online at:  http://www.[facility].org    MEDICAL RECORDS AND TESTS  Certain laboratory test results do not come back the same day, for example urine cultures.   We will contact you if other important findings are noted.  Radiology films are often reviewed again to ensure accuracy.  If there is any discrepancy, we will notify you.      Please call  [phone] to pick up a complimentary CD of any radiology studies performed.  If you or your doctor would like to request a copy of your medical records, please call [phone].          ORTHOPEDIC INJURY   Please know that significant injuries can exist even when an initial x-[place] is read as normal or negative.  This can occur because some fractures (broken bones) are not initially visible on x-rays.  For this reason, close outpatient follow-up with your primary care doctor or bone specialist (orthopedist) is required.    MEDICATIONS AND FOLLOWUP  Please be aware that some prescription medications can cause drowsiness.  Use caution when driving or operating machinery.    The examination and treatment you have received in our Emergency Department is provided on an emergency basis, and is not intended to be a substitute for your primary care physician.  It is important that your doctor checks you again and that you report any new or remaining problems at that time.      24 HOUR PHARMACIES  [facility] - [address], [place], [place] [address] (1.4 miles, 7 minutes)  Handout with directions available on request      ASSISTANCE WITH INSURANCE    Affordable Care Act  (ACA)  Call to start or finish an application, compare plans, enroll or ask a question.  1-800-318-2596  TTY: 1-855-889-4325    Web:  Healthcare.gov    Help Enrolling in Medicaid  Cover [place]  (855) 242-8282 (TOLL-FREE)  (888) 221-1590 (TTY)  Web:  Http://www.coverva.org    Local Help Enrolling in the ACA  [facility]  ([phone] 748-2580 (MAIN)  Email:  health-help@nvfs.org  Web:  Http://www.nvfs.org  Address:  [address], [address] [place], [place] [address]

## 2020-04-10 ENCOUNTER — Telehealth: Payer: Self-pay

## 2020-04-10 NOTE — Telephone Encounter (Signed)
Called patient to f/u ER visit yesterday 8/19. Patient did not answer, LVM for patient to call at his earliest convenience if he has any further questions or concerns.

## 2020-04-14 ENCOUNTER — Other Ambulatory Visit (FREE_STANDING_LABORATORY_FACILITY): Payer: Medicare Other

## 2020-04-14 ENCOUNTER — Encounter (HOSPITAL_BASED_OUTPATIENT_CLINIC_OR_DEPARTMENT_OTHER): Payer: Self-pay | Admitting: Endocrinology, Diabetes and Metabolism

## 2020-04-14 ENCOUNTER — Ambulatory Visit (INDEPENDENT_AMBULATORY_CARE_PROVIDER_SITE_OTHER): Payer: Medicare Other | Admitting: Endocrinology, Diabetes and Metabolism

## 2020-04-14 VITALS — BP 129/73 | HR 64 | Temp 97.9°F | Ht 69.0 in | Wt 182.6 lb

## 2020-04-14 DIAGNOSIS — E291 Testicular hypofunction: Secondary | ICD-10-CM

## 2020-04-14 LAB — TESTOSTERONE: Testosterone: 446 ng/dL (ref 241–827)

## 2020-04-14 MED ORDER — HYDROCORTISONE 5 MG PO TABS
ORAL_TABLET | ORAL | 2 refills | Status: DC
Start: 2020-04-14 — End: 2020-07-06

## 2020-04-14 NOTE — Progress Notes (Signed)
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 I saw him once back in February [id number]. He was then referred to [place] by his PCP, Dr. Gloriajean Dell from [facility], for evaluation of hypopituitarism presumably secondary to brain surgery for meningioma. I reviewed the notes, imaging reports and labs in EPIC. He is accompanied by his spouse.   He was also seen in the past at the endocrine department at the [facility] clinic.    In the past, he has been on quite high doses of hydrocortisone (HC) at time (300+ mg daily) to treat symptoms of nausea, extreme fatigue, and dizziness. He was able to taper the dose of HC and wants to know if it can be discontinued. He is current taking a base dose of 20 mg in am.  He uses higher doses if he expereinces the above symptoms. In that past that approach helped but no much recently.      He is also using topical testosterone for hypogonadism.      He also takes levothyroxine for hypothyroidism.  Fatigue and dizziness have bene the dominant symptoms.    Active Problems        Patient Active Problem List    Diagnosis Date Noted   . History of meningioma of the brain 11/09/2015     Priority: High     12/09/02: s/p R parieto-occipital parasagittal craniotomy for 85-90% resection of R pareito-occipital falx meningioma that originally measured 61 x 24 mm    - 10/27/16: eval w [name] (Neurology - [facility]): agree w [name] that lesion is stable and that calcification should be reassuring . I recommended he remain under imaging surveillance. I have told him that it is very unlikely that there is a dysautonomia to account for his sx when upright, but I personally like to be sure that we have no evidence for that. He will be seen in Sleep Medicine and I endorse that as well.   - 07/04/16: eval w [name] (Neurosurgery -[facility]): stable both clinically and on imaging w regard to his now calcified residual  parasagittal meningioma. ... Now that it is calcified, I am not convinced there is any change between Jan and Oct of this year. I think continued observation would be very reasonable. Recommend FU MRI in one year.   - eval w [name], Neurosurgery [facility] - rec FU 1 year  - MRI Brain w/wo contrast 06/07/16: Again seen is a L posterior parafalcine meningioma. There is a slightly greater measurement in the cephalocaudal plane which may be related to technique or minimal tumor progression. Progressive opacification of the L mastoid air cells. Otherwise stable exam  - 10/13/16: letter from [name]: Neurosurgery at [facility]: "CT scan does show dents of calcification.Marland KitchenMarland KitchenAs we discussed in clinic I think it is unlikely that ongoing sx are related to meningioma .[name] I would continue to follow w MRI scan in one year.   - 10/07/15: eval w [name], Neurosurgery at [facility]: "number of nonspecific sx. I am not certain that they are related to any enlargement of his residual meningioma and certainly would not expect them to be improved by any treatment of his residual meningioma. With that in minds I think the question to establish is whether or not this residual is in fact enlarging. ... I suspect that this lesion is stable and in fact calcified. If that were the case, observation  with another MRI scan in a year may be very reasonable. Alternatively we could consider either gamma knife radiosurgery or craniotomy and resection. The lesion is at the UL for size for what we could treat w gamma knife, but I think it could be accomplished in one or perhaps two staged procedures. However I would only recommend this if the lesion was clearly enlarging and I am not certain of this at the present time. Recommend CT scan of head to confirm that lesion is calcified. I will be in touch with him after this is available.   - 10/06/15: eval w Neurology [name] [facility]): I have told Dr. Lacretia Nicks that I really cannot give him any advise  until I review outside pathology and prior films .[name] Pt requested appt in Fibromyalgia/Chronic fatigue clinic as well as Endo.   - 09/16/15 eval w [name], Neurosurgery [facility]: I do not believe any of the sx he is having now are related to the tumor. By report he did not have papilledema in [name] exam. I recommended however that he undergo CyberKnife radiosurgery. Pt will call my office to set up RadOnc consult and will proceed w CyberKnife tx.      . Chronic fatigue fibromyalgia syndrome 01/24/2013     Priority: High     In Aug 2006 hd noted spontaneous onset of severe fatigue and myalgias. He had extensive evaluation for CT and autoimmune disease. Ultimately he was discovered to have inadequate thyroid replacement as well as a low free testosterone. He was evaluated by endocrine at that time. ... Ultimately he was diagnosed with fibromyalgia and chronic fatigue syndrome for which he was prescribed various medications including provigil, cymbalta, gabapentin and pregabalin, and elavil (all w/o improvement). \    - Pt email 07/11/17: I have an appointment to see Dr. Penni Bombard on Wednesday, November 21st @1 :30.  - 06/22/17: Today the patient is mainly here to talk about his chronic pain. He carries a diagnosis from previous providers of chronic pain and fibromyalgia syndrome. He has been extensively evaluated in the past for this and other conditions by the [facility]. He has another annual examination with [facility] coming up in the next 5 months. He notes that lately he has had more difficulty with fatigue, generalized muscle pain and "brain fog." He has submitted routine blood work to the [facility] to monitor his adrenal insufficiency and hypothyroidism and these are reportedly stable. He feels like sometimes he feels "terrible" and has difficulty leaving the house due to pain and fatigue. He has tried a wide variety of medications in the past to help with fibromyalgia without  specific improvement. These are summarized below. 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 Currently he takes tramadol 25-50 mg once or twice daily and this helps but does not entirely alleviate his pain. Pain is worse in the morning, improves somewhat through the day. Fatigue is always present. He was recommended by his psychiatrist and neurologist at [facility] to try Remeron and donepezil. He did not tolerate these. He wonders about additional treatment options. Currently he feels like insomnia is better but fatigue and pain are worse. Despite his extensive workup with many specialists in the past at [facility] he wonders if there is still some occult cause of his symptoms which has not been identified.  - 10/08/15: note from  [name] [facility] Fibromyalgia and Chronic Fatigue): presentation is c/w fibromyalgia and chronic fatigue. Advised to complete Fibro/CFS treatment program  - 10/06/15 Note from Neurology Dr. Raynelle Fanning Hammack: In Aug 2006 noted spontaneous onset of severe fatigue and myalgias. He had extensive evaluation for CT and autoimmune disease. Ultimately he was discovered to have inadequate thyroid replacement as well as low free testosterone. He was eval by endocrine ... Ultimately he was diagnosed with fibromyalgia and chronic fatigue syndrome for which he was prescribed various medications including provigil, cymbalta, gabapentin and pregabalin.      [name] History of insomnia 01/17/2017     Priority: Medium     - 10/31/16: eval w Endocrine Dr. Tawanna Cooler [facility]): I believe this is a major contributor - we did have him do overnight oximetry with using CPAP and this did show continued desaturations that were very long periods of time when he was up and not able to fall asleep - he is seeing the sleep center      . Secondary adrenal insufficiency 01/17/2017     Priority:  Medium     - 11/23/16: eval w Endocrine Dr. Tawanna Cooler [facility]): due for reassessment of FT4 and IGF 1 in 4 weeks after changing dose on 10/27/16. Mail in specimen. Pursue recs for insomnia from psych. MRI did not show signs of dementia. PFT nl. FU 6-12 mos  - 10/24/16: eval w Endocrine Dr. Tawanna Cooler [facility]): despite adequate replacement w levothyroxine, GH and hydrocortisone he still has significant fatigue, although there has been a definite benefit from the hormone treatment. Levothyroxine dose is a little high given the slightly low TSH and this is not uncommon to see replacement dose requirements decrease for thyroid replacement when [facility] is initiated. We are waiting on reports on IGF1 and testosterone.      . Mood disorder secondary to multiple medical problems 01/17/2017     Priority: Medium     - 12/01/16 eval w psychiatry at [facility] Dr. James Ivanoff: "complicated situation. Recommend trying remeron 15 mg to help w sleep, mood and anxiety. ... If his sleep can be improved that could potentally improve his cognition. Try it for at least 6-8 weeks. He can let me know how he is doing via patient portal. I also recommended that he try to simplify his medication and situations, meaning not using klonopin or percocet. I cautioned him on marijuana use. Recommend continuing w cognitive behavioral therapy and psychotherapy techniques.      . Amnestic MCI (mild cognitive impairment with memory loss) 11/20/2016     Priority: Medium     - had dizziness on donepezil and remeron - [place] both - as of 01/18/17 - on half tab of donepezil 10 mg  - 11/24/16: eval w Neurology Dr. Betsey Holiday [facility]): He does have insomnia, and remeron just commenced which makes good sense. He may also benefit from cholinergic stimulation and therefore donepezil may be helpful here. .. We discussed potential role of amyloid PET testing. I will arrange an amyvid PET scan. If negative, early AD is effectively ruled out. Consider brain rehab  program. Meet back after PET testing.   - 10/31/16: eval w Endocrine Dr. Eyvonne Mechanic [facility]): Dr. Earney Hamburg completed neuropsychologic testing and incidates he has very focal impairment in verbal memory and to a lesser extent in visual memory and indicates this is consistent with the amnestic variety of mild cognitive impairment. Neurology is arranging for further testing with MRI for cognitive dysfunction and  brain PET scan.   - 10/28/16 eval w Dr Azucena Kuba [facility] Neuropsych): impaired performance in several measures of verbal learning and memory and some marginally poor performances in measures of nonverbal learning and memory. Marland KitchenMarland KitchenMarland KitchenThese results are significant and while neuropsychological testing in and of itself cannot diagnose any particular condition you would potentially benefit from further discussion, possibly with our colleagues in Neurology to determine if there is some underlying condition that may be contributing to these memory difficulties even beyond your history of brain tumor.      . Growth hormone deficiency 03/29/2016     Priority: Medium     - 10/31/16: eval w Endocrine Dr. Tawanna Cooler [facility]): increase GH to 0.4 mg daily  - 08/26/16: eval w Dr. Eyvonne Mechanic [facility] Endocrine): At this point suspect that tsome of these issues will improve the longer he is on [facility] .Marland KitchenMarland Kitchen Therefore I am not concerned with him taking 7.5 mg to 10 mg of prednisone a day or its equivalent if that is what he needs in the next several months.   - 07/11/16: eval w Dr. Tawanna Cooler [facility] Endocrine): he did have very + response to hydrocortisone replacement .[name] However he would lose effect after 4 hours. .. We have talked about options and could try prednisone with longer half life - swtch to t mg in AM and 1 mg between 3-5 pm and adjust as needed. He has been on finasteride for many years and give trial off of this. New rx for androgel packets. Thyroid function is excellent.   - 12/10/15: eval w Dr. Tawanna Cooler [facility]  Endocrine): no response to [facility] after glucagon stimulation. He does have some sx that could be explained by partial adrenal insufficiency ... I would like to proceed with a trial of glucocorticoid replacement now prior to starting [facility] to see if he has the typical response we would see w cortisol replacement. ... If his s do not improve w hydrocortisone in replacement doses this can be [place]. I think however we still would need to follow this given the unclear nature of the underlying pituitary process and the possibility that deficits may develop as time goes on.   - 10/08/15: note from [name] [facility] Fibromyalgia and Chronic Fatigue): presentation is c/w fibromyalgia and chronic fatigue. Advised to complete Fibro/CFS treatment program  - 10/07/15: eval w Dr. Eyvonne Mechanic [facility] Endocrine): Await testosterone levels and advise on adjustments ... We do not need to worry about adrenal insufficiency. However he has multiple sx which are c/w growth hormone deficiency and I think this is worth defining. I will do glucagon stimulation test - if this confirms GH deficiency I would strongly recommend replacement which hopefully will improve at least some of his sx particularly a component of his fatigue, his social isolation, his decreased ambition and altered body composition. I will see him after testing is complete.      . Essential hypertension 01/24/2013     Priority: Medium     - eval w Dr. Ova Freshwater (Nephor/HTN at [facility]): BP seems better, monitor home readings - not having significant autonomic dysfunction  -ECHO 10/27/16 [facility]: nl LV size and systolic function w EF of 65% - mild AV regurgitation and mild ascending aortic dilatation - recommend repeat ECHO in 12 months  - 10/26/16: eval w Dr. Ova Freshwater (Nephro/HTN at [facility]): recommend change amlodipine to 10 mg from 5 mg and take at night. Continue ramipril 20 mg. I would hesitate  to start clonidine given dry eyes and dry mouth. I would hesitate to  put him on diuretic given the adrenal insufficiency. I would therefore lead toward putting him on something like low dose alpha blocker like carvedilol. ... He is agreeable to try this. We will follow up next month.   - 10/25/16: eval w Dr. Allen Norris (Cardiology): mild AV regurge based on outside echo, repeat echo. Borderline mid ascending aortic dilatation. Recommend ECHO in 12 months for FU. Lipid profile appears appropriate     . Mixed hyperlipidemia 01/24/2013     Priority: Medium     TC 189 TRI 112 HDL 81 LDL 86 09/14/16 -     Pt reports he has statin for many years (was taken off to see if this would help with fibromyalgia pain wo improvement) and zetia and tricor     . Primary hypothyroidism 01/24/2013     Priority: Medium     - reports TSH in Oct 2018 from [facility] reportedly normal  - TSH 0.436 as of 12/20/16 FT4 wnl  - 10/31/16: eval w Endocrine Dr. Tawanna Cooler [facility]): FT4 mildly elevated on 200 mcg day - decrease to 150 mcg daily and recheck FT4 in 8 weeks       . Hemianopia, homonymous, left 09/15/2015     Priority: Low     - evaluation w Ophthalmology Dr. Ferd Glassing /24/17: this patient continues to do exceptionally well almost 13 years following resection of a right parietooccipital falcine meningioma. Prior to surgery, he had an inferior left homonymous defect that improved following surgery and has subsequently stabilized with minimal residual.Even though he has a moderate residual lesion abutting and occluding the posterior sagittal sinus, with slight compression of the medial aspect of the left occipital lobe, his examin today remains absolutely stable with no evidence of a worsening field defect and no evidence of papilledema or optic atrophy. He has a stable nevus in the right fundus. I will see him again in 1-2 years. In meantime, he has asked for a suggestion of an endocrinologist who deals with pituitary hormone dysfunction and he also may be interested in seeing a radiation  therapist for discussion of stereotactic radiosurgery using a gamma knife (as opposed to the Cyberknife). I have recommended that he discuss these issues with [name] when he sees him tomorrow.     . Secondary male hypogonadism 01/24/2013     Priority: Low     - - 10/31/16: eval w Endocrine Dr. Tawanna Cooler [facility]): testosterone level is excellent right now - continue current therapy         . Spondylosis of cervical region without myelopathy or radiculopathy 08/09/2017     - MRI C Spine w/o contrast: C2-C3: Small posterior disc bulge partially effacing the ventral CSF and gently indenting the ventral cervical spinal cord. There is no underlying cord signal abnormality. There is no central canal nor is there foraminal narrowing. C3-C4: There is a disc osteophyte complex with a superimposed posterior central disc protrusion which partially effaces the ventral CSF and indents the ventral cervical spinal cord. There is no central canal stenosis or foraminal narrowing. C4-C5: There is a disc osteophyte complex with prominent posterior lateral disc component within the foramina bilaterally, right greater than left. Posterior disc bulge partially effaces the ventral CSF and indents the right ventral cervical spinal cord. There is mild central canal stenosis. There is no foraminal narrowing. C5-C6: There is disc space narrowing and disc desiccation. There is a  disc osteophyte complex with prominent uncovertebral hypertrophy, right greater than left. Posterior disc bulge partially effaces the ventral CSF. There is no central canal stenosis. There is mild to moderate right-sided foraminal encroachment. The left foramen is patent. C6-C7: There is disc space narrowing and disc desiccation. There is a disc osteophyte complex with right posterior lateral disc herniation partially effacing the ventral CSF and mildly flattening. The underlying cervical spinal cord. There is marked right-sided  foraminal encroachment as well as mild left-sided foraminal narrowing. There is facet arthropathy. C7-T1: There is no evidence of central canal or foraminal stenosis. Overall impression: Multilevel cervical spondylosis as described in detail above.  - evaluation [facility]. Penni Bombard Nov 2018     . History of herniated intervertebral disc 01/17/2017     2005: developed R hip and leg pain and was told he had "small central disk herniation" at L5-S1. Surgery was not recommended. He received PT and some epidural corticosteroid injections which improved but did not completely resolve the situation. Low back and leg pain recurred in 2006 and was again tx w steroid and PT.      . Mild OSA  01/17/2017     - 10/31/16: eval w sleep medicine Dr. Ala Dach [facility]): reviewed original records from sleep study in Sept 2017 and CPAP trial in Oct 2017 ... My opinion at this time his OSA is not sig enough to require a focused effort on using his CPAP consistently ... Particularly since he reports not having found CPAP to be helpful or restful, I think it is best to set it aside for the time being and at some point another sleep lab study and titration may be helpful. ... Depending on comorbidities this degree of OSA is worth an effort at treating but when there has not been any consistent benefit even in the past when he tried it consistently, then it is reasonable to put on hold for the time being.      . Chronic diarrhea 01/17/2017     - eval w ID 11/21/16 Dr. Marcy Panning [facility]): has had extensive evaluation. I cannot identify an infectious disease that is likely to cause symptomatology. As he does have some cognitive decline and chronic diarrhea I will be checking a whipple PCR on blood although I think this is highly unlikely as he does not also have joint symptoms. A duodenal aspirate would be needed to confirm or completely rule out this diagnosis. I will also be checking for heavy metals. Given  the amount of fish he consumes I am expecting to have high levels organic arsenic which are not toxic. . I will also be testing for stronglyloides which can be found in [place] and [place]. I will call if any of these tests are positive.      . Recurrent sinusitis 01/17/2017     - 11/21/16: eval w ENT Dr. Jannette Spanner [facility]): For recurrent sinusitis, he is on good medical regiment w daily nasal irrigations. Surgery would be indicated for 4-5 episodes of sinusitis per year. He was not interested in operative intervention and this is reasonable. In re to his deviated septum septoplasty could be considered. He was not interested at this time. To address his nasal obstruction on a mucosal basis I recommended triple spray. Avoid afrin. Offered him fluoroscopic eval of his dysphagia but he deferred. Would not recommend surgery for OSA.      [name] Panhypopituitarism 03/29/2016   . Chronic, continuous use of opioids  11/09/2015     - As of May 2018 pt reports he gets his pain medication and klonazepam from [facility]  - 07/01/16: note from [facility] Dr. Toni Amend: Pt informed I will no longer refill percocet, clonazepam, or provigil as there are no clear disease indications. Pt upset and feels he needs these meds for his CF/FMS. I advised pt that he will need to have either rheum or pain specialist provide if they feel they are indicated. Pt has shown no evidence of abuse, but also has been on meds for several years and has shown no benefit in my opinion. Pt is currently being treated for panhypopit and adrenal insuff, hypogonadism, and hypothyroidism as a result, as wellas, growth hormone deficiency. Further analysis suggests polypharmacy with potential drug interactions. The pt knows how to wean clonazepam and has asked for 30days of tramadol to wean from the percocet. Note he is only on a half tablet of percocet qid. As a result will provide only 25mg  tabs to start qday and increase every third day to bid and tid max for 30  days total. No refills will be provided. Pt is aware.      [name] History of asthma 01/24/2013     - since childhood    - 11/23/16: eval w Dr. Renita Papa (Pulm at [facility]): bronchial asthma mild, allergic rhinitis postnasal drainage: uses albuterol BID for chest tightness. Check nitric oxide test. He has had intermittent eosinophilia and this points towards an eosinophilic asthma phenotype. He does have allergic rhinitis and think he would be better servied with our triple combination nose spray which includes mometasone, diphenhydramine and iptratroprium. I gave him rx for this.      [name] BPH (benign prostatic hyperplasia) 01/24/2013     As far as the AI, there was a question on whether he absorbs the [facility]. A 24 hour UFC was obtained and showed elevated free cortisol values indicative of adequate absorption. The patient raised the question of whether he could be metabolizing the [facility] "too fast" and hence explaining the increased need of HC dosing.   In addition to [employer], he is on growth Hormone replacement and the doses are adjusted by the [facility] clinic endocrinologist. He is on testosterone and levothyroxine a well. He is not of desmopressin.  He has HTN and hyperlipidemia and is on a statin and anti hypertensive drug therapy.      Clinically his main issue is chronic pain, mainly in the back of the neck and shoulder areas, as well as fatigue.  Fibromyalgia/chronic fatigue syndrome was entertained. His weight has been stable overall. He has required different pain medication regimens in order to allow him to cop with the pain.   He describes episodes of what he refers to as "adrenal crisis" whee he feels nauseated, dizzy and very tired. He feels much better after few days of high doses hydrocortisone. He also used parenteral Solucortef in the past to treat his symptoms.     Past Medical History:   Diagnosis Date   . Aortic regurgitation    . Ascending aortic aneurysm 09/19/2018    4 cm ECHO   . Avascular necrosis 2008    R hip  - did not require surgery   . Bilateral cataracts     budding   . Brain tumor 2003    Taken out in 2003, has returned    . Calculus of kidney    . Cold intolerance    . Color blindness    .  Disorder of prostate     BPH   . DJD (degenerative joint disease)     Lumbar / Sacral   . Dry eyes     uses restasis eyedrops which helps (RF, CCP, CRP and ESR have been wnl in past)   . Dysautonomia    . Eczema 03/29/2016    Right thumb    . Encounter for blood transfusion 2004   . Encounter for hepatitis C screening test for low risk patient 08/2016    negative   . Encounter for pharmacogenetic testing 10/2015    MEDIMAP   . Fibromyalgia    . Gastroesophageal reflux disease    . Hemorrhoids    . Hyperlipidemia    . Hypertension    . Hypothyroidism    . Lateral epicondylitis     Left   . Malignant neoplasm of skin    . Mitral valve insufficiency    . Osteoporosis    . Peripheral neuropathy 12/26/2019   . Screening PSA (prostate specific antigen) 02/14/2018    0.4   . Sleep apnea     mild, no cpap   . Tinnitus     high pitched buzzing comes and goes - started after craniotomy for meningioma     Past Surgical History:   Procedure Laterality Date   . COLONOSCOPY  2008   . ECHOCARDIOGRAM, TRANSTHORACIC  04/29/2005    63   . ECHOCARDIOGRAM, TRANSTHORACIC  10/17/2016    63   . EGD, COLONOSCOPY N/A 04/16/2019    Procedure: EGD, COLONOSCOPY;  Surgeon: Leatha Gilding, MD;  Location: [facility];  Service: Gastroenterology;  Laterality: N/A;  egd, colonoscopy  q1-unk, md req 45 min   . EXCISION, LESION  12/03/2008    benign lesion   . ORCHIOPEXY Left 1980    for intermittent torsion   . Subtotal resection of posterior sagittal meningioma  12/05/2002    Partial brain resection secondary to meningioma at [facility]   . TONSILLECTOMY AND ADENOIDECTOMY  as a child   . TOOTH EXTRACTION Right 06/2018     Family History   Problem Relation Age of Onset   . Asthma Daughter    . Tuberculosis Mother    . Dementia Mother    . Migraines Sister       Allergies   Allergen Reactions   . Bextra [Valdecoxib] Rash     Social History     Socioeconomic History   . Marital status: Married     Spouse name: Not on file   . Number of children: Not on file   . Years of education: Not on file   . Highest education level: Not on file   Occupational History   . Not on file   Tobacco Use   . Smoking status: Never Smoker   . Smokeless tobacco: Never Used   Vaping Use   . Vaping Use: Never used   Substance and Sexual Activity   . Alcohol use: Yes     Comment: Rarely   . Drug use: Yes     Types: Marijuana     Comment: MJ for pain daily- Tablets   . Sexual activity: Not Currently     Partners: Female     Birth control/protection: None   Other Topics Concern   . Not on file   Social History Narrative    He lives in [place] and is from [place]. Married - wife Angelique Blonder. Two daughters (one lives in [place] and  one in [place]). Retired Web designer - retired in 2016.     Social Determinants of Health     Financial Resource Strain:    . Difficulty of Paying Living Expenses:    Food Insecurity:    . Worried About Programme researcher, broadcasting/film/video in the Last Year:    . Barista in the Last Year:    Transportation Needs:    . Freight forwarder (Medical):    [name] Lack of Transportation (Non-Medical):    Physical Activity:    . Days of Exercise per Week:    . Minutes of Exercise per Session:    Stress:    . Feeling of Stress :    Social Connections:    . Frequency of Communication with Friends and Family:    . Frequency of Social Gatherings with Friends and Family:    . Attends Religious Services:    . Active Member of Clubs or Organizations:    . Attends Banker Meetings:    [name] Marital Status:    Intimate Partner Violence:    . Fear of Current or Ex-Partner:    . Emotionally Abused:    [name] Physically Abused:    . Sexually Abused:      Current Outpatient Medications on File Prior to Visit   Medication Sig Dispense Refill   . albuterol (PROAIR HFA) 108 (90 Base) MCG/ACT inhaler INHALE ONE  PUFF BY MOUTH EVERY FOUR TO SIX HOURS AS NEEDED FOR WHEEZING (Patient taking differently: 2 puffs 2 (two) times daily INHALE ONE PUFF BY MOUTH EVERY FOUR TO SIX HOURS AS NEEDED FOR WHEEZING  ) 25.5 g 1   . AMITRIPTYLINE HCL PO Take 20 mg by mouth nightly        . amLODIPine (NORVASC) 5 MG tablet Take 1 tablet (5 mg total) by mouth 2 (two) times daily 180 tablet 3   . BELBUCA 450 MCG Film 2 (two) times daily        . Calcium Carbonate-Vitamin D (CALCIUM + D PO) Take by mouth. 500/400mg   daily      . clonazePAM (KLONOPIN) 0.5 MG tablet Take 0.5 mg by mouth daily as needed     2   . cyanocobalamin 1000 MCG tablet every 24 hours     . docusate sodium (Colace) 100 MG capsule Take 100 mg by mouth 2 (two) times daily     . ezetimibe (ZETIA) 10 MG tablet Take 1 tablet (10 mg total) by mouth daily 90 tablet 3   . finasteride (PROSCAR) 5 MG tablet Take 1 tablet (5 mg total) by mouth nightly 90 tablet 3   . finasteride 2.5 mg Tab Take 5 mg by mouth     . hydrocortisone (CORTEF) 20 MG tablet Take 1 tablet (20 mg total) by mouth daily Take 4 PO TID F3 (Patient taking differently: Take 20 mg by mouth daily   ) 90 tablet 1   . levothyroxine (SYNTHROID) 150 MCG tablet TAKE ONE TABLET BY MOUTH ONE TIME DAILY AT 6 PM 90 tablet 0   . metoprolol succinate XL (Toprol XL) 25 MG 24 hr tablet Take 1 tablet (25 mg total) by mouth nightly 30 tablet 0   . modafinil (PROVIGIL) 200 MG tablet Take 200 mg by mouth daily PRN       . montelukast (SINGULAIR) 10 MG tablet Take one tablet by mouth at bedtime as directed 90 tablet 0   . Motegrity 2 MG Tab Take 1  tablet by mouth daily     . ondansetron (ZOFRAN-ODT) 4 MG disintegrating tablet 4 mg as needed     . pantoprazole (PROTONIX) 40 MG tablet Take 40 mg by mouth daily     . pilocarpine (SALAGEN) 5 MG tablet Take 1 tablet (5 mg total) by mouth 3 (three) times daily as needed (dry mouth) 270 tablet 0   . prochlorperazine (COMPAZINE) 10 MG tablet Take 5 mg by mouth as needed        . promethazine  (PHENERGAN) 25 MG suppository every 6 (six) hours as needed        . psyllium (Metamucil) 0.52 g capsule Take 1.6 g by mouth 2 (two) times daily     . ramipril (ALTACE) 10 MG capsule Take 2 capsules (20 mg total) by mouth daily 180 capsule 1   . senna (SENOKOT) 8.6 MG tablet Take 17.2 mg by mouth 2 (two) times daily     . simvastatin (ZOCOR) 20 MG tablet Take 1 tablet (20 mg total) by mouth nightly 90 tablet 1   . SYMBICORT 80-4.5 MCG/ACT inhaler Inhale 2 puffs into the lungs two times daily (Patient taking differently: 2 puffs 2 (two) times daily Have patient rinse mouth after use  ) 10.2 g 3   . tamsulosin (FLOMAX) 0.4 MG Cap Take 1 capsule (0.4 mg total) by mouth Daily after dinner 90 capsule 3   . Testosterone 20.25 MG/ACT (1.62%) Gel Apply 4 actuations daily (2 on each shoulder) 300 g 2   . zolpidem (AMBIEN) 10 MG tablet Take 1 tablet (10 mg total) by mouth nightly as needed for Sleep 20 tablet 0     No current facility-administered medications on file prior to visit.     Vitals:    04/14/20 1229   BP: 129/73   Pulse: 64   Temp: 97.9 F (36.6 C)   SpO2: 95%     Lab Results   Component Value Date    HGBA1C 6.2 (H) 11/27/2019    HGBA1C 6.3 (H) 09/06/2018    HGBA1C 6.2 (H) 02/13/2018     Lab Results   Component Value Date    WBC 8.78 04/09/2020    HGB 13.1 04/09/2020    HCT 38.1 04/09/2020    PLT 184 04/09/2020    CHOL 177 02/13/2018    TRIG 237 (H) 02/13/2018    HDL 54 02/13/2018    LDL 76 02/13/2018    ALT 21 04/09/2020    AST 16 04/09/2020    NA 139 04/09/2020    K 4.0 04/09/2020    CL 105 04/09/2020    CREAT 1.2 04/09/2020    BUN 25 04/09/2020    CO2 25 04/09/2020    TSH 0.155 (L) 11/27/2019    PSA 0.4 02/13/2018    INR 1.0 05/22/2018    GLU 106 (H) 04/09/2020    HGBA1C 6.2 (H) 11/27/2019     Lab Results   Component Value Date    TSH 0.155 (L) 11/27/2019    T4 7.4 09/06/2018     Review of Systems    Constitutional: fatigue and chronic pain  Eyes: negative  Respiratory: negative  Cardiovascular:  negative  Gastrointestinal: negative  Musculoskeletal: negative   Neurological: negative  Behavioral: negative  Skin: negative  Hematology: negative      Examination  Vitals:    12/31/19 1452   BP: 163/81   Pulse: 97   Temp:    SpO2:      Constitutional:  Oriented to person, place, and time and well-developed, well-nourished, and in no distress.   Head: Normocephalic and atraumatic.   Eyes: Conjunctivae and EOM are normal. Pupils are equal, round, and reactive to light.   Neck: Normal thyroid exam.  Cardiac: Normal auscultation.  Pulmonary: Normal auscultation.  Extremities: No cyanosis, clubbing or edema.  Musculoskeletal: Normal range of motion.   Neurological: Alert and oriented to person, place, and time.   Psychiatric: Affect and judgment normal.  He does not have cushingoid features.      Lab Review    Results for Jamison Neighbor, MD    Ref. Range 03/23/2018 14:33 11/27/2019 14:59   Testosterone Latest Ref Range: 264 - 916 ng/dL 161 (L) 096 (L)       Imaging report    CT Head WO Contrast [[id number]] (Order [id number])   Status: Final result   Study Result     History: Nausea. History of meningioma.    TECHNIQUE: Noncontrast CT of the brain. The following dose reduction  techniques were utilized: automatic exposure control and/or adjustment  of mA and/or kV according to patient size, and the use of iterative  reconstruction technique.    COMPARISON: MRI of the brain dated 06/07/2016.    FINDINGS: Again seen are postoperative changes with evidence of parietal  and occipital craniotomy. There is stable encephalomalacia involving the  right parietal and occipital lobes. There is a 27 x 24 mm lobulated  partially calcified extra-axial mass centered upon the posterior aspect  of the falx cerebri, eccentric towards the left, which appears to invade  the superior sagittal sinus. This lesion corresponds to the enhancing  extra-axial mass seen on the prior MRI study and is consistent with  meningioma. The mass  indents the medial left parietal and occipital  lobes without significant rectal edema.    There is no evidence of acute infarction or intracranial hemorrhage. The  ventricles are normal without hydrocephalus. The orbits appear  unremarkable. The visualized portions of the paranasal sinuses are  clear.    IMPRESSION:     1. No acute intracranial abnormality.  2. Partially calcified extra-axial mass centered upon the posterior  aspect of the falx cerebri which invades the superior sagittal sinus is  similar in configuration compared to the previous MRI study of  06/07/2016 and is consistent with meningioma.    Georgann Housekeeper, MD   03/11/2017 11:39 AM     MRI Brain W WO Contrast [[id number]] (Order [id number])   Status: Final result   Study Result     [facility]  MRI [place] EXAM PERFORMED AT:  [id number] DOB:10/07/1950 [address]  06/07/2016 AGE:69 Gender:M Physicians Only: [phone]        Coralyn Mark MD [F]  [address]  [place], [place] [address]        MRI BRAIN WITHOUT AND WITH CONTRAST    HISTORY: Followup meningioma    COMPARISON: A prior brain MR 09/10/15    TECHNIQUE: MR imaging of the brain was performed on a 1.5 Tesla magnet  prior to and following the uneventful intravenous administration of 8 cc of  Gadavist contrast material.    FINDINGS: Again noted are postsurgical changes centered in the right  occipital lobe. There is a resection cavity surrounded by hyperintense  T2/FLAIR signal which is stable in volume. No masslike or nodular  enhancement is seen at the level of resection.    Again  identified is an extra-axial enhancing mass lesion involving the  midline occipital and posterior left parietal region. It measures  approximately 2.8 cm AP x  2.5 cm transverse x 4.3 cm tall (compared to 2.8  cm AP x 2.5 cm transverse x 4 cm tall when measured on the postcontrast  images). Again seen is invasion/occlusion of the adjacent superior sagittal  sinus. There is mild mass effect on the left parasagittal occipital lobe  however there is no cerebral edema.    There is stability of a subtle dural thickening in the left parafalcine  region superiorly.    The brain parenchyma maintains an otherwise normal signal and enhancement  pattern. Ventricular size and position is normal. There are appropriate  flow voids of the major vessels.    There is stability of a largely CSF filled sella turcica.    There is progressive opacification of the left mastoid air cells. There is  no significant paranasal sinus disease.    IMPRESSION:     1. Again seen is a left posterior parafalcine meningioma. There is a  slightly greater measurement in the cephalocaudal plane which may be  related to technique or minimal tumor progression.  2. Progressive opacification of the left mastoid air cells.  3. Otherwise stable exam.    The examination was reviewed with the patient.     Electronically signed by: Otho Ket M.D.  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 Patient is currently on [facility] for (AI), levothyroxine, testosterone and growth hormone replacement.  Will order an ACTH stimulation tests to see how much of adrenal reserve he has.  As far a steststerone (T), his T levels are sub-optimal and I recommended a target T level in the 440 to 500 ng/dL for optimal efficacy. Will titrate the dose of the topical androgen to achieve that goal.  Repeat T level in few weeks after dose totration  He can return in few months or as needed.      Elijio Miles.H.Tamera Punt, MD

## 2020-04-18 ENCOUNTER — Other Ambulatory Visit: Payer: Self-pay | Admitting: Nurse Practitioner

## 2020-04-18 ENCOUNTER — Ambulatory Visit
Admission: RE | Admit: 2020-04-18 | Discharge: 2020-04-18 | Disposition: A | Discharge status: Home / Self Care | Payer: Medicare Other | Source: Ambulatory Visit | Attending: Nurse Practitioner | Admitting: Nurse Practitioner

## 2020-04-18 DIAGNOSIS — M546 Pain in thoracic spine: Secondary | ICD-10-CM | POA: Insufficient documentation

## 2020-04-18 DIAGNOSIS — R937 Abnormal findings on diagnostic imaging of other parts of musculoskeletal system: Secondary | ICD-10-CM | POA: Insufficient documentation

## 2020-04-18 DIAGNOSIS — M47814 Spondylosis without myelopathy or radiculopathy, thoracic region: Secondary | ICD-10-CM | POA: Insufficient documentation

## 2020-04-22 ENCOUNTER — Other Ambulatory Visit: Payer: Self-pay | Admitting: Internal Medicine

## 2020-04-22 ENCOUNTER — Other Ambulatory Visit: Payer: Self-pay

## 2020-04-22 DIAGNOSIS — Z8709 Personal history of other diseases of the respiratory system: Secondary | ICD-10-CM

## 2020-04-22 DIAGNOSIS — E039 Hypothyroidism, unspecified: Secondary | ICD-10-CM

## 2020-04-22 MED ORDER — LEVOTHYROXINE SODIUM 150 MCG PO TABS
ORAL_TABLET | ORAL | 0 refills | Status: DC
Start: 2020-04-22 — End: 2020-08-10

## 2020-04-22 MED ORDER — ALBUTEROL SULFATE HFA 108 (90 BASE) MCG/ACT IN AERS
INHALATION_SPRAY | RESPIRATORY_TRACT | 1 refills | Status: AC
Start: 2020-04-22 — End: ?

## 2020-04-23 ENCOUNTER — Telehealth: Payer: Self-pay

## 2020-04-23 NOTE — Telephone Encounter (Signed)
Called pt see if they had already received a 3rd COVID-19 booster shot already - LVM to call back.

## 2020-04-28 ENCOUNTER — Emergency Department
Admission: EM | Admit: 2020-04-28 | Discharge: 2020-04-28 | Disposition: A | Discharge status: Home / Self Care | Payer: Medicare Other | Attending: Emergency Medical Services | Admitting: Emergency Medical Services

## 2020-04-28 DIAGNOSIS — E039 Hypothyroidism, unspecified: Secondary | ICD-10-CM | POA: Insufficient documentation

## 2020-04-28 DIAGNOSIS — R110 Nausea: Secondary | ICD-10-CM

## 2020-04-28 DIAGNOSIS — Z87442 Personal history of urinary calculi: Secondary | ICD-10-CM | POA: Insufficient documentation

## 2020-04-28 DIAGNOSIS — Z8719 Personal history of other diseases of the digestive system: Secondary | ICD-10-CM | POA: Insufficient documentation

## 2020-04-28 DIAGNOSIS — Z7951 Long term (current) use of inhaled steroids: Secondary | ICD-10-CM | POA: Insufficient documentation

## 2020-04-28 DIAGNOSIS — E785 Hyperlipidemia, unspecified: Secondary | ICD-10-CM | POA: Insufficient documentation

## 2020-04-28 DIAGNOSIS — I10 Essential (primary) hypertension: Secondary | ICD-10-CM | POA: Insufficient documentation

## 2020-04-28 LAB — COMPREHENSIVE METABOLIC PANEL
ALT: 36 U/L (ref 0–55)
AST (SGOT): 26 U/L (ref 5–34)
Albumin/Globulin Ratio: 1.7 (ref 0.9–2.2)
Albumin: 4.2 g/dL (ref 3.5–5.0)
Alkaline Phosphatase: 61 U/L (ref 38–106)
Anion Gap: 12 (ref 5.0–15.0)
BUN: 17 mg/dL (ref 9–28)
Bilirubin, Total: 1.5 mg/dL — ABNORMAL HIGH (ref 0.2–1.2)
CO2: 23 mEq/L (ref 22–29)
Calcium: 10 mg/dL (ref 7.9–10.2)
Chloride: 105 mEq/L (ref 100–111)
Creatinine: 1.2 mg/dL (ref 0.7–1.3)
Globulin: 2.5 g/dL (ref 2.0–3.6)
Glucose: 95 mg/dL (ref 70–100)
Potassium: 3.8 mEq/L (ref 3.5–5.1)
Protein, Total: 6.7 g/dL (ref 6.0–8.3)
Sodium: 140 mEq/L (ref 136–145)

## 2020-04-28 LAB — CBC AND DIFFERENTIAL
Absolute NRBC: 0 10*3/uL (ref 0.00–0.00)
Basophils Absolute Automated: 0.07 10*3/uL (ref 0.00–0.08)
Basophils Automated: 0.6 %
Eosinophils Absolute Automated: 0.15 10*3/uL (ref 0.00–0.44)
Eosinophils Automated: 1.4 %
Hematocrit: 45.4 % (ref 37.6–49.6)
Hgb: 15.7 g/dL (ref 12.5–17.1)
Immature Granulocytes Absolute: 0.04 10*3/uL (ref 0.00–0.07)
Immature Granulocytes: 0.4 %
Lymphocytes Absolute Automated: 5.31 10*3/uL — ABNORMAL HIGH (ref 0.42–3.22)
Lymphocytes Automated: 48.4 %
MCH: 29.9 pg (ref 25.1–33.5)
MCHC: 34.6 g/dL (ref 31.5–35.8)
MCV: 86.5 fL (ref 78.0–96.0)
MPV: 10.4 fL (ref 8.9–12.5)
Monocytes Absolute Automated: 0.84 10*3/uL (ref 0.21–0.85)
Monocytes: 7.7 %
Neutrophils Absolute: 4.57 10*3/uL (ref 1.10–6.33)
Neutrophils: 41.5 %
Nucleated RBC: 0 /100 WBC (ref 0.0–0.0)
Platelets: 235 10*3/uL (ref 142–346)
RBC: 5.25 10*6/uL (ref 4.20–5.90)
RDW: 12 % (ref 11–15)
WBC: 10.98 10*3/uL — ABNORMAL HIGH (ref 3.10–9.50)

## 2020-04-28 LAB — GFR: EGFR: 59.7

## 2020-04-28 LAB — LIPASE: Lipase: 17 U/L (ref 8–78)

## 2020-04-28 MED ORDER — SODIUM CHLORIDE 0.9 % IV SOLN
12.5000 mg | Freq: Once | INTRAVENOUS | Status: AC
Start: 2020-04-28 — End: 2020-04-28
  Administered 2020-04-28: 12.5 mg via INTRAVENOUS
  Filled 2020-04-28: qty 1

## 2020-04-28 MED ORDER — SODIUM CHLORIDE 0.9 % IV BOLUS
1000.0000 mL | Freq: Once | INTRAVENOUS | Status: AC
Start: 2020-04-28 — End: 2020-04-28
  Administered 2020-04-28: 1000 mL via INTRAVENOUS

## 2020-04-28 MED ORDER — LORAZEPAM 2 MG/ML IJ SOLN
1.0000 mg | Freq: Once | INTRAMUSCULAR | Status: AC
Start: 2020-04-28 — End: 2020-04-28
  Administered 2020-04-28: 1 mg via INTRAVENOUS
  Filled 2020-04-28: qty 1

## 2020-04-28 MED ORDER — HYDROCORTISONE SOD SUC (PF) 100 MG IJ SOLR (WRAP)
25.0000 mg | Freq: Once | INTRAMUSCULAR | Status: AC
Start: 2020-04-28 — End: 2020-04-28
  Administered 2020-04-28: 08:00:00 25 mg via INTRAVENOUS
  Filled 2020-04-28: qty 2

## 2020-04-28 NOTE — ED Provider Notes (Signed)
History     Chief Complaint   Patient presents with   . Nausea       Patient has been diagnosed with meningioma which cause chronic nausea.  He gets episodes with extreme discomfort agitation and nausea this is been present for the last 3 days with poor oral intake he feels chills this is typical for when he has these episodes there is been no fever no reported pain per se in his head no focal numbness or weakness or gait changes.  He has been refractory to a number of medications.  He does have multiple specialist that are involved in his care.  History provided by patient and spouse current severity is severe      Past Medical History:   Diagnosis Date   . Aortic regurgitation    . Ascending aortic aneurysm 09/19/2018    4 cm ECHO   . Avascular necrosis 2008    R hip - did not require surgery   . Bilateral cataracts     budding   . Brain tumor 2003    Taken out in 2003, has returned    . Calculus of kidney    . Cold intolerance    . Color blindness    . Disorder of prostate     BPH   . DJD (degenerative joint disease)     Lumbar / Sacral   . Dry eyes     uses restasis eyedrops which helps (RF, CCP, CRP and ESR have been wnl in past)   . Dysautonomia    . Eczema 03/29/2016    Right thumb    . Encounter for blood transfusion 2004   . Encounter for hepatitis C screening test for low risk patient 08/2016    negative   . Encounter for pharmacogenetic testing 10/2015    MEDIMAP   . Fibromyalgia    . Gastroesophageal reflux disease    . Hemorrhoids    . Hyperlipidemia    . Hypertension    . Hypothyroidism    . Lateral epicondylitis     Left   . Malignant neoplasm of skin    . Mitral valve insufficiency    . Osteoporosis    . Peripheral neuropathy 12/26/2019   . Screening PSA (prostate specific antigen) 02/14/2018    0.4   . Sleep apnea     mild, no cpap   . Tinnitus     high pitched buzzing comes and goes - started after craniotomy for meningioma       Past Surgical History:   Procedure Laterality Date   . COLONOSCOPY   2008   . ECHOCARDIOGRAM, TRANSTHORACIC  04/29/2005    63   . ECHOCARDIOGRAM, TRANSTHORACIC  10/17/2016    63   . EGD, COLONOSCOPY N/A 04/16/2019    Procedure: EGD, COLONOSCOPY;  Surgeon: Leatha Gilding, MD;  Location: [facility];  Service: Gastroenterology;  Laterality: N/A;  egd, colonoscopy  q1-unk, md req 45 min   . EXCISION, LESION  12/03/2008    benign lesion   . ORCHIOPEXY Left 1980    for intermittent torsion   . Subtotal resection of posterior sagittal meningioma  12/05/2002    Partial brain resection secondary to [facility] at [facility]   . TONSILLECTOMY AND ADENOIDECTOMY  as a child   . TOOTH EXTRACTION Right 06/2018       Family History   Problem Relation Age of Onset   . Asthma Daughter    . Tuberculosis Mother    .  Dementia Mother    . Migraines Sister        Social  Social History     Tobacco Use   . Smoking status: Never Smoker   . Smokeless tobacco: Never Used   Vaping Use   . Vaping Use: Never used   Substance Use Topics   . Alcohol use: Yes     Comment: Rarely   . Drug use: Yes     Types: Marijuana     Comment: MJ for pain daily- Tablets       .     Allergies   Allergen Reactions   . Bextra [Valdecoxib] Rash       Home Medications     Med List Status: Complete Set By: Leander Rams, RN at 04/28/2020  7:00 AM                albuterol sulfate HFA (ProAir HFA) 108 (90 Base) MCG/ACT inhaler     INHALE ONE PUFF BY MOUTH EVERY FOUR TO SIX HOURS AS NEEDED FOR WHEEZING     AMITRIPTYLINE HCL PO     Take 20 mg by mouth nightly        amLODIPine (NORVASC) 5 MG tablet     Take 1 tablet (5 mg total) by mouth 2 (two) times daily     BELBUCA 450 MCG Film     2 (two) times daily        Calcium Carbonate-Vitamin D (CALCIUM + D PO)     Take by mouth. 500/400mg   daily      clonazePAM (KLONOPIN) 0.5 MG tablet     Take 0.5 mg by mouth daily as needed        clotrimazole (MYCELEX) 10 MG troche     Take 10 mg by mouth 5 (five) times daily     cyanocobalamin 1000 MCG tablet     every 24 hours     docusate sodium  (Colace) 100 MG capsule     Take 100 mg by mouth 2 (two) times daily     ezetimibe (ZETIA) 10 MG tablet     Take 1 tablet (10 mg total) by mouth daily     finasteride (PROSCAR) 5 MG tablet     Take 1 tablet (5 mg total) by mouth nightly     finasteride 2.5 mg Tab     Take 5 mg by mouth     hydrocortisone (CORTEF) 5 MG tablet     Take 15 mg in am, 5 mg in early afternoon and 5 mg at 7 pm. Allow for extra doses in case of stress, illness, fever.     levothyroxine (SYNTHROID) 150 MCG tablet     TAKE ONE TABLET BY MOUTH ONE TIME DAILY AT 6 PM     metoprolol succinate XL (Toprol XL) 25 MG 24 hr tablet     Take 1 tablet (25 mg total) by mouth nightly     modafinil (PROVIGIL) 200 MG tablet     Take 200 mg by mouth daily PRN       montelukast (SINGULAIR) 10 MG tablet     Take one tablet by mouth at bedtime as directed     Motegrity 2 MG Tab     Take 1 tablet by mouth daily     ondansetron (ZOFRAN-ODT) 4 MG disintegrating tablet     4 mg as needed     pantoprazole (PROTONIX) 40 MG tablet     Take 40 mg by  mouth daily     pilocarpine (SALAGEN) 5 MG tablet     Take 1 tablet (5 mg total) by mouth 3 (three) times daily as needed (dry mouth)     prochlorperazine (COMPAZINE) 10 MG tablet     Take 5 mg by mouth as needed        promethazine (PHENERGAN) 25 MG suppository     every 6 (six) hours as needed        psyllium (Metamucil) 0.52 g capsule     Take 1.6 g by mouth 2 (two) times daily     ramipril (ALTACE) 10 MG capsule     Take 2 capsules (20 mg total) by mouth daily     senna (SENOKOT) 8.6 MG tablet     Take 17.2 mg by mouth 2 (two) times daily     simvastatin (ZOCOR) 20 MG tablet     Take 1 tablet (20 mg total) by mouth nightly     SYMBICORT 80-4.5 MCG/ACT inhaler     Inhale 2 puffs into the lungs two times daily     Patient taking differently: 2 puffs 2 (two) times daily Have patient rinse mouth after use       tamsulosin (FLOMAX) 0.4 MG Cap     Take 1 capsule (0.4 mg total) by mouth Daily after dinner     Testosterone  20.25 MG/ACT (1.62%) Gel     Apply 4 actuations daily (2 on each shoulder)     zolpidem (AMBIEN) 10 MG tablet     Take 1 tablet (10 mg total) by mouth nightly as needed for Sleep           Review of Systems   Constitutional: Positive for chills. Negative for fever.   HENT: Negative.    Respiratory: Negative.    Cardiovascular: Negative.    Gastrointestinal: Positive for nausea.   Neurological: Negative for weakness.       Physical Exam    BP: 155/83, Heart Rate: 82, Temp: 98 F (36.7 C), Resp Rate: 20, SpO2: 99 %, Weight: 77.6 kg     Physical Exam  HENT:      Head: Normocephalic.      Mouth/Throat:      Mouth: Mucous membranes are dry.   Eyes:      Pupils: Pupils are equal, round, and reactive to light.   Cardiovascular:      Rate and Rhythm: Normal rate and regular rhythm.   Pulmonary:      Effort: Pulmonary effort is normal.      Breath sounds: Normal breath sounds.   Abdominal:      General: Bowel sounds are normal.      Palpations: Abdomen is soft.   Musculoskeletal:      Cervical back: Neck supple.   Skin:     General: Skin is warm.      Capillary Refill: Capillary refill takes less than 2 seconds.   Neurological:      General: No focal deficit present.      Mental Status: He is alert.      Cranial Nerves: No cranial nerve deficit.      Sensory: No sensory deficit.      Motor: No weakness.           MDM and ED Course     ED Medication Orders (From admission, onward)    Start Ordered     Status Ordering Provider    04/28/20 0758 04/28/20 0757  hydrocortisone sodium  succinate (Solu-CORTEF) injection 25 mg  Once     Route: Intravenous  Ordered Dose: 25 mg     Last MAR action: Given Chase Picket    04/28/20 0659 04/28/20 0658  LORazepam (ATIVAN) injection 1 mg  Once     Route: Intravenous  Ordered Dose: 1 mg     Last MAR action: Given Chase Picket    04/28/20 2956 04/28/20 0658  sodium chloride 0.9 % bolus 1,000 mL  Once     Route: Intravenous  Ordered Dose: 1,000 mL     Last MAR action: New Bag Chase Picket    04/28/20 2130 04/28/20 0646  promethazine (PHENERGAN) 12.5 mg in sodium chloride 0.9 % 50 mL IVPB  Once     Route: Intravenous  Ordered Dose: 12.5 mg     Last MAR action: Given Rosamaria Lints             MDM           Patient with refractory nausea episodes despite home medication with benzodiazepines IV nausea medication he has improvement to the point that he feels well    Did advise he could consider talking to his pain management doctor about weaning off his opioids as it may contribute to his nausea but he believes these nausea episodes preceded the use of pain medication chronically  He is on several medications it is always possible that chronic nausea is related to these it has been reported based on prior imaging that his meningioma is stable and has no acute neurologic deficit      Procedures    Clinical Impression & Disposition     Clinical Impression  Final diagnoses:   Nausea        ED Disposition     ED Disposition Condition Date/Time Comment    Discharge  Tue Apr 28, 2020  8:46 AM Carol Enid Derry, MD discharge to home/self care.    Condition at disposition: Stable           New Prescriptions    No medications on file                 Chase Picket, MD  04/28/20 [phone]

## 2020-04-28 NOTE — Discharge Instructions (Signed)
Nausea     You have been seen for nausea.     Nausea is the feeling that you are going to vomit (throw up). Nausea is not a disease. It is a symptom of another problem. For example, nausea, vomiting and diarrhea are symptoms of a stomach virus (the "stomach flu").     You may or may not vomit when you have nausea.      The nausea itself is not dangerous but it can be very uncomfortable.     We might not be able to find out today what is causing your nausea. It is VERY IMPORTANT to see your doctor who can watch for any serious problems.     There are many treatments for nausea. The medical staff will discuss these with you. Some common medicines used to help with nausea are  · Promethazine (Phenergan®), prochlorperazine (Compazine®), metoclopramide (Reglan®), ondansetron (Zofran®), and many others.     Follow a clear liquid diet. Drink water, broth, 7-Up, Sprite, or other clear caffeine-free soft drinks or sports drinks until you feel better. This might help the nausea and keep you from vomiting.      If your nausea lasts for longer than a few days, or if you have new symptoms, we STRONGLY RECOMMEND that you go to see your family doctor, specialist or clinic. If you cannot get an appointment or do not have a doctor you can always return here or go to the nearest emergency department to be seen again.     YOU SHOULD SEEK MEDICAL ATTENTION IMMEDIATELY, EITHER HERE OR AT THE NEAREST EMERGENCY DEPARTMENT, IF ANY OF THE FOLLOWING OCCURS:  · You vomit often.  · You have abdominal (belly) pain.  · You vomit blood or anything that looks like coffee grounds.  · You have a headache.  · You have any new symptoms or concerns.  · You feel more “unwell.”

## 2020-04-28 NOTE — ED Triage Notes (Signed)
Pt. reports brain tumor that causes n/v episodes. Nausea began sunday morning.

## 2020-04-29 ENCOUNTER — Encounter: Payer: Self-pay | Admitting: Internal Medicine

## 2020-05-01 ENCOUNTER — Encounter: Payer: Self-pay | Admitting: Internal Medicine

## 2020-05-03 ENCOUNTER — Encounter: Payer: Self-pay | Admitting: Internal Medicine

## 2020-05-04 ENCOUNTER — Encounter (INDEPENDENT_AMBULATORY_CARE_PROVIDER_SITE_OTHER): Payer: Self-pay | Admitting: Cardiology

## 2020-05-07 ENCOUNTER — Telehealth: Payer: Self-pay | Admitting: Internal Medicine

## 2020-05-07 NOTE — Telephone Encounter (Addendum)
Patient called and stated that Dr. Romilda Garret had emailed yesterday that he needed to follow with with Dr. Tami Ribas at [facility] soon.Pt states 1st appt is not until mid October.    I called [facility] to see if he can be seen by NP Daryel November sooner.  She also does not have anything before Oct 15th. Have him scheduled with one of the other NPs for Sept 23rd at 10:00 am.  I am contact Dr. Derry Skill office to see why he needs to be seen,    I did email the patient the following    Dr. [name],    I was able to schedule you with NP Imagene Gurney for next Thursday, September 23rd at 10:00 am.   This will be at their [facility] office.

## 2020-05-11 ENCOUNTER — Encounter: Payer: Self-pay | Admitting: Internal Medicine

## 2020-05-11 ENCOUNTER — Emergency Department
Admission: EM | Admit: 2020-05-11 | Discharge: 2020-05-11 | Disposition: A | Discharge status: Home / Self Care | Payer: Medicare Other | Attending: Emergency Medicine | Admitting: Emergency Medicine

## 2020-05-11 DIAGNOSIS — E785 Hyperlipidemia, unspecified: Secondary | ICD-10-CM | POA: Insufficient documentation

## 2020-05-11 DIAGNOSIS — R110 Nausea: Secondary | ICD-10-CM | POA: Insufficient documentation

## 2020-05-11 DIAGNOSIS — I340 Nonrheumatic mitral (valve) insufficiency: Secondary | ICD-10-CM | POA: Insufficient documentation

## 2020-05-11 DIAGNOSIS — E039 Hypothyroidism, unspecified: Secondary | ICD-10-CM | POA: Insufficient documentation

## 2020-05-11 DIAGNOSIS — I10 Essential (primary) hypertension: Secondary | ICD-10-CM | POA: Insufficient documentation

## 2020-05-11 LAB — COMPREHENSIVE METABOLIC PANEL
ALT: 45 U/L (ref 0–55)
AST (SGOT): 29 U/L (ref 5–34)
Albumin/Globulin Ratio: 1.6 (ref 0.9–2.2)
Albumin: 4.4 g/dL (ref 3.5–5.0)
Alkaline Phosphatase: 63 U/L (ref 38–106)
Anion Gap: 10 (ref 5.0–15.0)
BUN: 18 mg/dL (ref 9–28)
Bilirubin, Total: 0.9 mg/dL (ref 0.2–1.2)
CO2: 26 mEq/L (ref 22–29)
Calcium: 10.2 mg/dL (ref 7.9–10.2)
Chloride: 104 mEq/L (ref 100–111)
Creatinine: 1.3 mg/dL (ref 0.7–1.3)
Globulin: 2.7 g/dL (ref 2.0–3.6)
Glucose: 113 mg/dL — ABNORMAL HIGH (ref 70–100)
Potassium: 4.2 mEq/L (ref 3.5–5.1)
Protein, Total: 7.1 g/dL (ref 6.0–8.3)
Sodium: 140 mEq/L (ref 136–145)

## 2020-05-11 LAB — CBC AND DIFFERENTIAL
Absolute NRBC: 0 10*3/uL (ref 0.00–0.00)
Basophils Absolute Automated: 0.1 10*3/uL — ABNORMAL HIGH (ref 0.00–0.08)
Basophils Automated: 0.7 %
Eosinophils Absolute Automated: 0.12 10*3/uL (ref 0.00–0.44)
Eosinophils Automated: 0.8 %
Hematocrit: 50.4 % — ABNORMAL HIGH (ref 37.6–49.6)
Hgb: 17.2 g/dL — ABNORMAL HIGH (ref 12.5–17.1)
Immature Granulocytes Absolute: 0.11 10*3/uL — ABNORMAL HIGH (ref 0.00–0.07)
Immature Granulocytes: 0.8 %
Lymphocytes Absolute Automated: 3.79 10*3/uL — ABNORMAL HIGH (ref 0.42–3.22)
Lymphocytes Automated: 26.1 %
MCH: 29.9 pg (ref 25.1–33.5)
MCHC: 34.1 g/dL (ref 31.5–35.8)
MCV: 87.7 fL (ref 78.0–96.0)
MPV: 10.7 fL (ref 8.9–12.5)
Monocytes Absolute Automated: 0.94 10*3/uL — ABNORMAL HIGH (ref 0.21–0.85)
Monocytes: 6.5 %
Neutrophils Absolute: 9.45 10*3/uL — ABNORMAL HIGH (ref 1.10–6.33)
Neutrophils: 65.1 %
Nucleated RBC: 0 /100 WBC (ref 0.0–0.0)
Platelets: 246 10*3/uL (ref 142–346)
RBC: 5.75 10*6/uL (ref 4.20–5.90)
RDW: 12 % (ref 11–15)
WBC: 14.51 10*3/uL — ABNORMAL HIGH (ref 3.10–9.50)

## 2020-05-11 LAB — GFR: EGFR: 54.4

## 2020-05-11 MED ORDER — SODIUM CHLORIDE 0.9 % IV BOLUS
1000.0000 mL | Freq: Once | INTRAVENOUS | Status: AC
Start: 2020-05-11 — End: 2020-05-11
  Administered 2020-05-11: 1000 mL via INTRAVENOUS

## 2020-05-11 MED ORDER — SODIUM CHLORIDE 0.9 % IV SOLN
12.5000 mg | Freq: Once | INTRAVENOUS | Status: AC
Start: 2020-05-11 — End: 2020-05-11
  Administered 2020-05-11: 12.5 mg via INTRAVENOUS
  Filled 2020-05-11: qty 1

## 2020-05-11 MED ORDER — HYDROCORTISONE SOD SUC (PF) 100 MG IJ SOLR (WRAP)
25.0000 mg | Freq: Once | INTRAMUSCULAR | Status: AC
Start: 2020-05-11 — End: 2020-05-11
  Administered 2020-05-11: 25 mg via INTRAVENOUS
  Filled 2020-05-11: qty 2

## 2020-05-11 NOTE — EDIE (Signed)
COLLECTIVE?NOTIFICATION?05/11/2020 10:58?Shannon [name], Shannon [name]?MRN: [id number]    Criteria Met      Narx Scores Alert    Security and Safety  No recent Security Events currently on file    ED Care Guidelines  There are currently no ED Care Guidelines for this patient. Please check your facility's medical records system.        Prescription Monitoring Program  501??- Narcotic Use Score  410??- Sedative Use Score  170??- Stimulant Use Score  220??- Overdose Risk Score  - All Scores range from 000-999 with 75% of the population scoring < 200 and on 1% scoring above 650  - The last digit of the narcotic, sedative, and stimulant score indicates the number of active prescriptions of that type  - Higher Use scores correlate with increased prescribers, pharmacies, mg equiv, and overlapping prescriptions  - Higher Overdose Risk Scores correlate with increased risk of unintentional overdose death   Concerning or unexpectedly high scores should prompt [name] review of the PMP record; this does not constitute checking PMP for prescribing purposes.      E.D. Visit Count (12 mo.)  Facility Visits   [facility]: [place] 1   [place] 3   Total 4   Note: Visits indicate total known visits.     Recent Emergency Department Visit Summary  Date Facility [facility] Type Diagnoses or Chief Complaint   May 11, 2020 [facility]      N/V; weight loss;brain tumor      Apr 28, 2020 [facility]      nausea vomiitng      Nausea      Apr 09, 2020 [facility]      vomiting      Emesis      Nausea      Personal history of benign neoplasm of the brain      Personal history of other endocrine, nutritional and metabolic disease      Cyclical vomiting syndrome unrelated to migraine      Dec 19, 2019 [facility] [place] [facility]      nausea and vomiting has [name] brain tumor not able to eat          Recent Inpatient Visit  Summary  No recorded inpatient visits.     Care Team  Provider Specialty Phone Fax Service Dates   BRODERICK, Pearletha Furl, MD Internal Medicine   Current      Collective Portal  This patient has registered at the [facility] Emergency Department   For more information visit: [email or web address]     PLEASE NOTE:     1.   Any care recommendations and other clinical information are provided as guidelines or for historical purposes only, and providers should exercise their own clinical judgment when providing care.    2.   You may only use this information for purposes of treatment, payment or health care operations activities, and subject to the limitations of applicable Collective Policies.    3.   You should consult directly with the organization that provided [name] care guideline or other clinical history with any questions about additional information or accuracy or completeness of information provided.    ? 2021 [place]. - [email or web address]

## 2020-05-11 NOTE — ED Triage Notes (Signed)
has begnin brain tumor, c/o nausea and approx 5-6 lb weight loss in last week. Pt also reports sob with exertion x1 week.

## 2020-05-11 NOTE — Discharge Instructions (Signed)
Dear Dr. Jamison Neighbor, MD:    Thank you for choosing the [facility] for your healthcare needs.  I hope your visit today was EXCELLENT.     Please see Modesto Charon, MD in 2-3 days.  Discuss vestibular physical therapy with your family doctor.    [email or web address]    [email or web address]     You may receive a survey via text after your visit today and your feedback is very important to me as  your physician. I appreciate any feedback you can provide so my team and I can continue to improve  the care we provide.    Below is some information that our patients often find helpful.  Information on obtaining a primary care physician is below.     We wish you good health and please do not hesitate to contact us if we can ever be of any assistance.    Sincerely,  Netta Neat, MD  [facility]    ____________________________________________      Thank you for choosing [facility] for your emergency care needs.  We strive to provide EXCELLENT care to you and your family.      IF YOU DO NOT CONTINUE TO IMPROVE OR YOUR CONDITION WORSENS, PLEASE CONTACT YOUR DOCTOR OR RETURN IMMEDIATELY TO THE EMERGENCY DEPARTMENT.    ONSITE PHARMACY  Our full service onsite pharmacy is a 2 minute walk from the ER.  Open Mon to Fri from 8 am to 8 pm, Sat  9 am to 5 pm. Ask an ED staff member for directions.  We accept all major insurances and prices are competitive with major retailers.  Ask your provider to print your prescriptions down to the pharmacy to speed you on your way home.    OBTAINING A PRIMARY CARE APPOINTMENT    Primary care physicians (PCPs, also known as primary care doctors) are either internists or family medicine doctors. Both types of PCPs focus on health promotion,  disease prevention, patient education and counseling, and treatment of acute and chronic medical conditions.    Call for an appointment with a primary care doctor.  Ask to see who is taking new patients. Two options are below.    Both groups have offices near [place] and in the [place] area.    [facility]  telephone:  [phone]  [email or web address]    [facility]  telephone:  [phone]  [email or web address]      DOCTOR REFERRALS  Call [phone] (available 24 hours a day, 7 days a week) if you need any further referrals and we can help you find a primary care doctor or specialist.  Also, available online at:  [email or web address]      YOUR CONTACT INFORMATION  Before leaving please check with registration to make sure we have an up-to-date contact number.  You can call registration at [phone] to update your information.  For questions about your hospital bill, please call [phone].  For questions about your Emergency Dept Physician bill please call [phone].      FREE HEALTH SERVICES  If you need help with health or social services, please call 2-1-1 for a free referral to resources in your area.  2-1-1 is a free service connecting people with information on health insurance, free clinics, pregnancy, mental health, dental care, food assistance, housing, and substance abuse counseling.  Also, available online at:  [email or web address] AND  TESTS  Certain laboratory test results do not come back the same day, for example urine cultures.   We will contact you if other important findings are noted.  Radiology films are often reviewed again to ensure accuracy.  If there is any discrepancy, we will notify you.      Please call [phone] to pick up a complimentary CD of any radiology studies performed.  If you or your doctor would like to request a copy of your medical records, please call [phone].       ORTHOPEDIC INJURY   Please know that significant injuries can exist even when an initial x-Amber is read as normal or negative.  This can occur because some fractures (broken bones) are not initially visible on x-rays.  For this reason, close outpatient follow-up with your primary care doctor or bone specialist (orthopedist) is required.    MEDICATIONS AND FOLLOWUP  Please be aware that some prescription medications can cause drowsiness.  Use caution when driving or operating machinery.    The examination and treatment you have received in our Emergency Department is provided on an emergency basis, and is not intended to be a substitute for your primary care physician.  It is important that your doctor checks you again and that you report any new or remaining problems at that time.      24 HOUR PHARMACIES  [facility] [address], [place], [place] [address] (1.4 miles, 7 minutes)  [facility] - [address] [place], [place] [address] (6.5 miles, 13 minutes)  Handout with directions available on request      ASSISTANCE WITH INSURANCE    Affordable Care Act  [facility])  Call to start or finish an application, compare plans, enroll or ask a question.  [phone]  TTY: 3021643508  Web:  Healthcare.gov    Help Enrolling in [facility]  [employer] [place]  442-237-6352 (TOLL-FREE)  765-632-4558 (TTY)  Web:  Http://www.coverva.org    Local Help Enrolling in the [facility]  [place] [facility]  [phone] (MAIN)  Email:  health-help@nvfs .org  Web:  BlackjackMyths.is  Address:  [address] [place], [place] [address]

## 2020-05-11 NOTE — ED Provider Notes (Signed)
[facility] NOTE    Patient Name: Shannon Neighbor, MD  Encounter Date:  05/11/2020  Attending Physician: Netta Neat, MD  Patient DOB:  04-09-1951  MRN:  [id number]  Room:  04/A04    Diagnosis and Disposition     Clinical Impression  1. Nausea        Disposition  ED Disposition     ED Disposition Condition Date/Time Comment    Discharge  Mon May 11, 2020  2:31 PM Corderro Enid Derry, MD discharge to home/self care.    Condition at disposition: Stable          Vital Signs     Patient Vitals for the past 24 hrs:   BP Temp Temp src Pulse Resp SpO2 Weight   05/11/20 1230 (!) 150/93 - - 84 - 93 % -   05/11/20 1206 (!) 182/109 - - 95 - 95 % -   05/11/20 1132 (!) 145/92 98.8 F (37.1 C) Oral 98 18 97 % -   05/11/20 1130 - - - - - - 77.8 kg         MDM and Clinical Notes       ED course / discussion of tests / vitals:    Acute on chronic nausea.  Improved with meds and fluids.  Advised close outpatient PCP follow-up and care coordination.  Very complex history of sx over several years.  Unclear if vestibular PT might be of some help.      Pt feels better after Phenergan and 2L NS in the ED.     COVID Notes  The patient was seen and evaluated during the time of COVID pandemic.  Significant limitations can be present in the evaluation and management of ED patients during pandemic conditions, including but not limited to lack of testing, rapidly changing IHS protocols, limited evaluation and follow-up resources.       Conversations:       ED Medications Administered     ED Medication Orders (From admission, onward)    Start Ordered     Status Ordering Provider    05/11/20 1318 05/11/20 1317  sodium chloride 0.9 % bolus 1,000 mL  Once     Route: Intravenous  Ordered Dose: 1,000 mL     Last MAR action: New Bag Brant Peets    05/11/20 1215 05/11/20 1214  hydrocortisone sodium succinate (Solu-CORTEF) injection 25 mg  Once     Route: Intravenous  Ordered Dose: 25 mg     Last MAR action: Given Jantz Main,  Branson Kranz    05/11/20 1214 05/11/20 1213  promethazine (PHENERGAN) 12.5 mg in sodium chloride 0.9 % 50 mL IVPB  Once     Route: Intravenous  Ordered Dose: 12.5 mg     Last MAR action: Given Luann Aspinwall    05/11/20 1204 05/11/20 1203  sodium chloride 0.9 % bolus 1,000 mL  Once     Route: Intravenous  Ordered Dose: 1,000 mL     Last MAR action: Stopped Sybel Standish          Orders Placed During This Encounter     Orders Placed This Encounter   Procedures   . CBC and differential   . Comprehensive metabolic panel   . GFR       EKG   EKG (interpreted by ED physician):     Diagnostic Study Results   The results of the diagnostic studies below were reviewed by the ED provider:    Labs  Results     Procedure Component Value Units Date/Time    GFR [[id number]] Collected: 05/11/20 1205     Updated: 05/11/20 1244     EGFR 54.4    Comprehensive metabolic panel [[id number]]  (Abnormal) Collected: 05/11/20 1205    Specimen: Blood Updated: 05/11/20 1244     Glucose 113 mg/dL      BUN 18 mg/dL      Creatinine 1.3 mg/dL      Sodium 440 mEq/L      Potassium 4.2 mEq/L      Chloride 104 mEq/L      CO2 26 mEq/L      Calcium 10.2 mg/dL      Protein, Total 7.1 g/dL      Albumin 4.4 g/dL      AST (SGOT) 29 U/L      ALT 45 U/L      Alkaline Phosphatase 63 U/L      Bilirubin, Total 0.9 mg/dL      Globulin 2.7 g/dL      Albumin/Globulin Ratio 1.6     Anion Gap 10.0    CBC and differential [[id number]]  (Abnormal) Collected: 05/11/20 1205    Specimen: Blood Updated: 05/11/20 1218     WBC 14.51 x10 3/uL      Hgb 17.2 g/dL      Hematocrit 44.0 %      Platelets 246 x10 3/uL      RBC 5.75 x10 6/uL      MCV 87.7 fL      MCH 29.9 pg      MCHC 34.1 g/dL      RDW 12 %      MPV 10.7 fL      Neutrophils 65.1 %      Lymphocytes Automated 26.1 %      Monocytes 6.5 %      Eosinophils Automated 0.8 %      Basophils Automated 0.7 %      Immature Granulocytes 0.8 %      Nucleated RBC 0.0 /100 WBC      Neutrophils Absolute 9.45 x10 3/uL      Lymphocytes Absolute  Automated 3.79 x10 3/uL      Monocytes Absolute Automated 0.94 x10 3/uL      Eosinophils Absolute Automated 0.12 x10 3/uL      Basophils Absolute Automated 0.10 x10 3/uL      Immature Granulocytes Absolute 0.11 x10 3/uL      Absolute NRBC 0.00 x10 3/uL           Radiologic Studies  Radiology Results (24 Hour)     ** No results found for the last 24 hours. **          History of Presenting Illness     69 y.o. male with multiple medical issues, but worsening of health over the past 4-5 years.  Chronic nausea of unclear etiology with extensive outpatient workup include endoscopy and colonoscopy, brain imaging, neuro eval, PCP eval, [facility] eval, and [facility] eval.  No clear cause discernable for bouts of nausea.  Now with gradual onset of moderate nausea today.  No vomiting.  No CP, SOB, abd pain, or back pain.  Same as prior; no acute change.      PMD:      Past Medical History     Past Medical History:   Diagnosis Date   . Aortic regurgitation    . Ascending aortic aneurysm 09/19/2018    4 cm ECHO   .  Avascular necrosis 2008    R hip - did not require surgery   . Bilateral cataracts     budding   . Brain tumor 2003    Taken out in 2003, has returned    . Calculus of kidney    . Cold intolerance    . Color blindness    . Disorder of prostate     BPH   . DJD (degenerative joint disease)     Lumbar / Sacral   . Dry eyes     uses restasis eyedrops which helps (RF, CCP, CRP and ESR have been wnl in past)   . Dysautonomia    . Eczema 03/29/2016    Right thumb    . Encounter for blood transfusion 2004   . Encounter for hepatitis C screening test for low risk patient 08/2016    negative   . Encounter for pharmacogenetic testing 10/2015    MEDIMAP   . Fibromyalgia    . Gastroesophageal reflux disease    . Hemorrhoids    . Hyperlipidemia    . Hypertension    . Hypothyroidism    . Lateral epicondylitis     Left   . Malignant neoplasm of skin    . Mitral valve insufficiency    . Osteoporosis    . Peripheral neuropathy 12/26/2019   .  Screening PSA (prostate specific antigen) 02/14/2018    0.4   . Sleep apnea     mild, no cpap   . Tinnitus     high pitched buzzing comes and goes - started after craniotomy for meningioma       Past Surgical History     Past Surgical History:   Procedure Laterality Date   . COLONOSCOPY  2008   . ECHOCARDIOGRAM, TRANSTHORACIC  04/29/2005    63   . ECHOCARDIOGRAM, TRANSTHORACIC  10/17/2016    63   . EGD, COLONOSCOPY N/A 04/16/2019    Procedure: EGD, COLONOSCOPY;  Surgeon: Leatha Gilding, MD;  Location: [facility];  Service: Gastroenterology;  Laterality: N/A;  egd, colonoscopy  q1-unk, md req 45 min   . EXCISION, LESION  12/03/2008    benign lesion   . ORCHIOPEXY Left 1980    for intermittent torsion   . Subtotal resection of posterior sagittal meningioma  12/05/2002    [facility] brain [facility]   . TONSILLECTOMY AND ADENOIDECTOMY  as a child   . TOOTH EXTRACTION Right 06/2018       Family History     Family History   Problem Relation Age of Onset   . Asthma Daughter    . Tuberculosis Mother    . Dementia Mother    . Migraines Sister        Social History     Social History     Tobacco Use   Smoking Status Never Smoker   Smokeless Tobacco Never Used     Social History     Substance and Sexual Activity   Alcohol Use Yes    Comment: Rarely     Social History     Substance and Sexual Activity   Drug Use Yes   . Types: Marijuana    Comment: MJ for pain daily- Tablets       Review of Systems     Constitutional:  No fever  Eyes: No discharge   ENT: No ST  CV:  No CP   Resp:  No SOB or cough  GI: Positive for nausea  GU: No dysuria  MS:    Skin: No rash  Neuro:  No HA  Psych:  No behavior changes  All other systems reviewed and negative      Physical Exam     Constitutional: Vital signs reviewed. Well appearing.  Head: Normocephalic, atraumatic  Eyes: Conjunctiva and sclera are normal.  No injection or discharge.  Ears, Nose, Throat:  Normal external examination of the nose and ears.     Neck: Normal range of motion. Trachea midline.  Respiratory/Chest: Clear to auscultation. No respiratory distress.   Cardiovascular: Regular rate and rhythm. No murmurs.   Abdomen:  No rebound or guarding. Soft.  Non-tender.  Back:    Upper Extremity:  No edema. No cyanosis.  Lower Extremity:  No edema. No cyanosis.  Skin: Warm and dry. No rash.  Psychiatric:  Normal affect.  Normal insight.  Neuro:  Alert and conversant.  Normal mental status.  Pupils equal round, reactive to light.  EOMI.  Face symmetric.  Speech fluid.  No drift.  Strength 5/5 UE and LE bilaterally.  Normal finger-nose-finger.      Prescriptions     Discharge Prescriptions     None          Home Meds on File     Home Medications     Med List Status: In Progress Set By: Carilyn Goodpasture, RN at 05/11/2020 11:33 AM                albuterol sulfate HFA (ProAir HFA) 108 (90 Base) MCG/ACT inhaler     INHALE ONE PUFF BY MOUTH EVERY FOUR TO SIX HOURS AS NEEDED FOR WHEEZING     Notes:  Pt requesting 3 inhalers - concerned about shortage     AMITRIPTYLINE HCL PO     Take 20 mg by mouth nightly        amLODIPine (NORVASC) 5 MG tablet     Take 1 tablet (5 mg total) by mouth 2 (two) times daily     BELBUCA 450 MCG Film     2 (two) times daily        Calcium Carbonate-Vitamin D (CALCIUM + D PO)     Take by mouth. 500/400mg   daily      clonazePAM (KLONOPIN) 0.5 MG tablet     Take 0.5 mg by mouth daily as needed        clotrimazole (MYCELEX) 10 MG troche     Take 10 mg by mouth 5 (five) times daily     cyanocobalamin 1000 MCG tablet     every 24 hours     docusate sodium (Colace) 100 MG capsule     Take 100 mg by mouth 2 (two) times daily     ezetimibe (ZETIA) 10 MG tablet     Take 1 tablet (10 mg total) by mouth daily     finasteride (PROSCAR) 5 MG tablet     Take 1 tablet (5 mg total) by mouth nightly     finasteride 2.5 mg Tab     Take 5 mg by mouth     hydrocortisone (CORTEF) 5 MG tablet     Take 15 mg in am, 5 mg in early afternoon and 5 mg at 7 pm. Allow for  extra doses in case of stress, illness, fever.     Notes:  New dose     levothyroxine (SYNTHROID) 150 MCG tablet     TAKE ONE TABLET BY MOUTH  ONE TIME DAILY AT 6 PM     metoprolol succinate XL (Toprol XL) 25 MG 24 hr tablet     Take 1 tablet (25 mg total) by mouth nightly     modafinil (PROVIGIL) 200 MG tablet     Take 200 mg by mouth daily PRN       montelukast (SINGULAIR) 10 MG tablet     Take one tablet by mouth at bedtime as directed     Motegrity 2 MG Tab     Take 1 tablet by mouth daily     ondansetron (ZOFRAN-ODT) 4 MG disintegrating tablet     4 mg as needed     pantoprazole (PROTONIX) 40 MG tablet     Take 40 mg by mouth daily     pilocarpine (SALAGEN) 5 MG tablet     Take 1 tablet (5 mg total) by mouth 3 (three) times daily as needed (dry mouth)     prochlorperazine (COMPAZINE) 10 MG tablet     Take 5 mg by mouth as needed        promethazine (PHENERGAN) 25 MG suppository     every 6 (six) hours as needed        psyllium (Metamucil) 0.52 g capsule     Take 1.6 g by mouth 2 (two) times daily     ramipril (ALTACE) 10 MG capsule     Take 2 capsules (20 mg total) by mouth daily     senna (SENOKOT) 8.6 MG tablet     Take 17.2 mg by mouth 2 (two) times daily     simvastatin (ZOCOR) 20 MG tablet     Take 1 tablet (20 mg total) by mouth nightly     SYMBICORT 80-4.5 MCG/ACT inhaler     Inhale 2 puffs into the lungs two times daily     Patient taking differently: 2 puffs 2 (two) times daily Have patient rinse mouth after use       tamsulosin (FLOMAX) 0.4 MG Cap     Take 1 capsule (0.4 mg total) by mouth Daily after dinner     Testosterone 20.25 MG/ACT (1.62%) Gel     Apply 4 actuations daily (2 on each shoulder)     zolpidem (AMBIEN) 10 MG tablet     Take 1 tablet (10 mg total) by mouth nightly as needed for Sleep             Isidoro Donning, MD  05/11/20 [phone]

## 2020-05-11 NOTE — ED Notes (Signed)
Pt verbalized understanding of dci, 0 rx, and f/u. Denies any further questions. No IV access. Ambulatory on own with steady gait noted. Family to accompany home.

## 2020-05-12 ENCOUNTER — Other Ambulatory Visit: Payer: Self-pay | Admitting: Internal Medicine

## 2020-05-12 DIAGNOSIS — F419 Anxiety disorder, unspecified: Secondary | ICD-10-CM

## 2020-05-12 MED ORDER — CLONAZEPAM 0.5 MG PO TABS
0.5000 mg | ORAL_TABLET | Freq: Every day | ORAL | 0 refills | Status: AC | PRN
Start: 2020-05-12 — End: ?

## 2020-05-12 NOTE — Progress Notes (Signed)
[facility] PROGRESS NOTE    [facility] OFFICE -CARDIOLOGY  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  Medical Record #: [id number]  Requesting Physician: Modesto Charon, MD      CHIEF COMPLAINT: Hospital Follow-up      HISTORY OF PRESENT ILLNESS:    He is a pleasant 69 y.o. male who presents today for followup. He was in the ER recently with debilitating nausea and poor PO intake and given hydration. Patient has been having nausea and seeing GI and neurology physicians. He has a MRI of brain ordered and pending for followup for meningioma resection in 2004. He has a history of dysautonomia, orthostatic hypotension, fibromyalgia and chronic fatigue syndrome. He has had workups at [facility] and [facility] and sees specialists locally. He has HTN and HLD. His BPs in the office today are mildly orthostatic. He denies dizziness. He has felt some relief with the scopolamine patient and has tried to stay hydrated. He continues to wear his medical-grade thigh-high compression stockings. He has intermittent dyspnea with stairs. He denies chest pain, palpitations or syncopal spell. His last echo in 2020 shows EF 60%, mild LVH, mild aortic dilation of 4cm and no significant abnormalities.         PAST MEDICAL HISTORY: He has a past medical history of Aortic regurgitation, Ascending aortic aneurysm (09/19/2018), Avascular necrosis (2008), Bilateral cataracts, Brain tumor (2003), Calculus of kidney, Cold intolerance, Color blindness, Disorder of prostate, DJD (degenerative joint disease), Dry eyes, Dysautonomia, Eczema (03/29/2016), Encounter for blood transfusion (2004), Encounter for hepatitis C screening test for low risk patient (08/2016), Encounter for pharmacogenetic testing (10/2015),  Fibromyalgia, Gastroesophageal reflux disease, Hemorrhoids, Hyperlipidemia, Hypertension, Hypothyroidism, Lateral epicondylitis, Malignant neoplasm of skin, Mitral valve insufficiency, Osteoporosis, Peripheral neuropathy (12/26/2019), Screening PSA (prostate specific antigen) (02/14/2018), Sleep apnea, and Tinnitus. He has a past surgical history that includes EXCISION, LESION (12/03/2008); Colonoscopy (2008); Tonsillectomy and adenoidectomy (as a child); Orchiopexy (Left, 1980); Subtotal resection of posterior sagittal meningioma (12/05/2002); Tooth extraction (Right, 06/2018); EGD, COLONOSCOPY (N/A, 04/16/2019); ECHOCARDIOGRAM, TRANSTHORACIC (04/29/2005); and ECHOCARDIOGRAM, TRANSTHORACIC (10/17/2016).    ALLERGIES:   Allergies   Allergen Reactions   . Bextra [Valdecoxib] Rash       MEDICATIONS:   Current Outpatient Medications   Medication Sig   . albuterol sulfate HFA (ProAir HFA) 108 (90 Base) MCG/ACT inhaler INHALE ONE PUFF BY MOUTH EVERY FOUR TO SIX HOURS AS NEEDED FOR WHEEZING   . AMITRIPTYLINE HCL PO Take 20 mg by mouth nightly      . amLODIPine (NORVASC) 5 MG tablet Take 1 tablet (5 mg total) by mouth 2 (two) times daily   . BELBUCA 450 MCG Film 2 (two) times daily      . Calcium Carbonate-Vitamin D (CALCIUM + D PO) Take by mouth. 500/400mg   daily    . clonazePAM (KlonoPIN) 0.5 MG tablet Take 1 tablet (0.5 mg total) by mouth daily as needed for Anxiety   . clotrimazole (MYCELEX) 10 MG troche Take 10 mg by mouth 5 (five) times daily   . cyanocobalamin 1000 MCG tablet every 24 hours   . docusate sodium (Colace) 100 MG capsule Take 100 mg by mouth 2 (two) times daily   . ezetimibe (  ZETIA) 10 MG tablet Take 1 tablet (10 mg total) by mouth daily   . finasteride (PROSCAR) 5 MG tablet Take 1 tablet (5 mg total) by mouth nightly   . finasteride 2.5 mg Tab Take 5 mg by mouth   . hydrocortisone (CORTEF) 5 MG tablet Take 15 mg in am, 5 mg in early afternoon and 5 mg at 7 pm. Allow for extra doses in case of stress,  illness, fever.   . levothyroxine (SYNTHROID) 150 MCG tablet TAKE ONE TABLET BY MOUTH ONE TIME DAILY AT 6 PM   . metoprolol succinate XL (Toprol XL) 25 MG 24 hr tablet Take 1 tablet (25 mg total) by mouth nightly   . modafinil (PROVIGIL) 200 MG tablet Take 200 mg by mouth daily PRN     . montelukast (SINGULAIR) 10 MG tablet Take one tablet by mouth at bedtime as directed   . Motegrity 2 MG Tab Take 1 tablet by mouth daily   . ondansetron (ZOFRAN-ODT) 4 MG disintegrating tablet 4 mg as needed   . pantoprazole (PROTONIX) 40 MG tablet Take 40 mg by mouth daily   . pilocarpine (SALAGEN) 5 MG tablet Take 1 tablet (5 mg total) by mouth 3 (three) times daily as needed (dry mouth)   . prochlorperazine (COMPAZINE) 10 MG tablet Take 5 mg by mouth as needed      . promethazine (PHENERGAN) 25 MG suppository every 6 (six) hours as needed      . psyllium (Metamucil) 0.52 g capsule Take 1.6 g by mouth 2 (two) times daily   . ramipril (ALTACE) 10 MG capsule Take 1 capsule (10 mg total) by mouth daily   . senna (SENOKOT) 8.6 MG tablet Take 17.2 mg by mouth 2 (two) times daily   . simvastatin (ZOCOR) 20 MG tablet Take 1 tablet (20 mg total) by mouth nightly   . SYMBICORT 80-4.5 MCG/ACT inhaler Inhale 2 puffs into the lungs two times daily (Patient taking differently: 2 puffs 2 (two) times daily Have patient rinse mouth after use  )   . tamsulosin (FLOMAX) 0.4 MG Cap Take 1 capsule (0.4 mg total) by mouth Daily after dinner   . Testosterone 20.25 MG/ACT (1.62%) Gel Apply 4 actuations daily (2 on each shoulder)   . zolpidem (AMBIEN) 10 MG tablet Take 1 tablet (10 mg total) by mouth nightly as needed for Sleep        FAMILY HISTORY: family history includes Asthma in his daughter; Dementia in his mother; Migraines in his sister; Tuberculosis in his mother.    SOCIAL HISTORY: He reports that he has never smoked. He has never used smokeless tobacco. He reports current alcohol use. He reports current drug use. Drug: Marijuana.    PHYSICAL  EXAMINATION    Visit Vitals  BP 112/74 (BP Site: Right arm, Patient Position: Standing)   Pulse 80   Ht 1.753 m (5\' 9" )   Wt 77.1 kg (170 lb)   BMI 25.10 kg/m     Weight Monitoring 03/16/2020 03/25/2020 04/09/2020 04/14/2020 04/28/2020 05/11/2020 05/14/2020   Height 175.3 cm - 175.3 cm 175.3 cm 175.3 cm - 175.3 cm   Height Method - - Stated - - - -   Weight 79.833 kg 82.918 kg 79.379 kg 82.827 kg 77.6 kg 77.8 kg 77.111 kg   Weight Method - - Stated - Standing Scale Standing Scale -   BMI (calculated) 26 kg/m2 - 25.9 kg/m2 27 kg/m2 25.3 kg/m2 - 25.2 kg/m2  General Appearance:  A well-appearing male in no acute distress.    Skin: Warm and dry to touch, no apparent skin lesions, or masses noted.  Head: Normocephalic, atraumatic  Eyes: conjunctivae and lids unremarkable.  ENT: Mask on  Neck: JVP normal  Chest: Clear to auscultation bilaterally with good air movement and respiratory effort and no wheezes, rales, or rhonchi   Cardiovascular: Regular rhythm, S1 normal, S2 normal, No S3 or S4, Apical impulse not displaced. No murmurs. No gallops or rubs detected   Abdomen: Soft, nontender, non-distended  Extremities: Warm without edema. 2+ radial pulses bilaterally  Neuro: Alert and oriented x3. No gross motor or sensory deficits noted, affect appropriate.           LABS:   No results found for: CBC  Lab Results   Component Value Date    AST 29 05/11/2020    ALT 45 05/11/2020     No results found for: LIPID  Lab Results   Component Value Date    HGBA1C 6.2 (H) 11/27/2019    TSH 0.155 (L) 11/27/2019         IMPRESSIONS:   1. Nausea; Complex symptomatic orthostatichypotension with spells of nausea and fatigue, likely related to his fibromyalgia and probable dysautonomia.  He has had workups at [facility] and [facility] and sees specialists locally.  2. Orthostatic hypotension today in the office, mild. Likely due to dehydration, poor PO intake recently  3. Hypertension,and would err on the side of permissive hypertension  given above  4. Longstanding mild aortic valve regurgitation withamildly dilated ascending aorta, stable by most recent echocardiogram in January 2020.  5. Hyperlipidemia, on statin and Zetia. Most recent LDL 76 in 2019.  6. Prior partial meningioma resection in 2004. Pending brain MRI by neurologist in followup  7. Panhypopituitarism.  8. Hypothyroidism.  9. Peripheral neuropathy.  10. Fibromyalgiaandchronic fatiguesyndrome,which remainquite debilitating.      RECOMMENDATIONS:    1.        Decrease his Ramipril form 20mg  to 10mg  to assist with correcting orthostasis. If his sitting BP is or above, he can take the additional tab of Ramipril  2. Continue hydration, liberalize salt intake, compression stockings  3.         Continue monitoring blood pressure and heart rate at home.  4. Continue to followup with his neurologist, PCP and GI specialists  5.         Follow-up with Dr. Tami Ribas as scheduled in approximately 3 weeks                                                   No orders of the defined types were placed in this encounter.      Orders Placed This Encounter   Medications   . ramipril (ALTACE) 10 MG capsule     Sig: Take 1 capsule (10 mg total) by mouth daily     Dispense:  180 capsule     Refill:  1       SIGNED:    Dreama Saa, PA          This note was generated by the Dragon speech recognition and may contain errors or omissions not intended by the user. Grammatical errors, random word insertions, deletions, pronoun errors, and incomplete sentences are occasional consequences of this technology  due to software limitations. Not all errors are caught or corrected. If there are questions or concerns about the content of this note or information contained within the body of this dictation, they should be addressed directly with the author for clarification.

## 2020-05-12 NOTE — Progress Notes (Signed)
I spoke with Shannon [name] today.  He continues to experience unrelenting problematic nausea which has been disabling for him.  He has not had vomiting.  He reports he was given a Solu-Medrol 25 mg dose in ER yesterday and given fluid and phenergan.  He notes that he had been using marijuana but discontinued a week ago. Encouraged him to avoid using this in case there was any chance this was contributing to his cyclical nausea.     He is pending repeat MRI for surveillance of his meningioma 05/19/20.  He has also been followed by gastroenterologist Dr. Jodie Echevaria.     ER NOTE AND LABS AS FOLLOWS: ED course / discussion of tests / vitals:   Acute on chronic nausea.  Improved with meds and fluids.  Advised close outpatient PCP follow-up and care coordination.  Very complex history of sx over several years.  Unclear if vestibular Shannon [name] might be of some help.  Shannon [name] feels better after Phenergan and 2L NS in the ED.     Admission on 05/11/2020, Discharged on 05/11/2020   Component Date Value Ref Range Status   . WBC 05/11/2020 14.51* 3.1 - 9.5 x10 3/uL Final   . Hgb 05/11/2020 17.2* 12.5 - 17.1 g/dL Final   . Hematocrit 16/05/9603 50.4* 37.6 - 49.6 % Final   . Platelets 05/11/2020 246  142 - 346 x10 3/uL Final   . RBC 05/11/2020 5.75  4.20 - 5.90 x10 6/uL Final   . MCV 05/11/2020 87.7  78.0 - 96.0 fL Final   . MCH 05/11/2020 29.9  25.1 - 33.5 pg Final   . MCHC 05/11/2020 34.1  31 - 35 g/dL Final   . RDW 54/04/8118 12  11.0 - 15.0 % Final   . MPV 05/11/2020 10.7  8.9 - 12.5 fL Final   . Neutrophils 05/11/2020 65.1  None % Final   . Lymphocytes Automated 05/11/2020 26.1  None % Final   . Monocytes 05/11/2020 6.5  None % Final   . Eosinophils Automated 05/11/2020 0.8  None % Final   . Basophils Automated 05/11/2020 0.7  None % Final   . Immature Granulocytes 05/11/2020 0.8  None % Final   . Nucleated RBC 05/11/2020 0.0  0.0 - 0.0 /100 WBC Final   . Neutrophils Absolute 05/11/2020 9.45* 1 - 6 x10 3/uL Final   . Lymphocytes Absolute Automated  05/11/2020 3.79* 0.42 - 3.22 x10 3/uL Final   . Monocytes Absolute Automated 05/11/2020 0.94* 0.21 - 0.85 x10 3/uL Final   . Eosinophils Absolute Automated 05/11/2020 0.12  0.00 - 0.44 x10 3/uL Final   . Basophils Absolute Automated 05/11/2020 0.10* 0.00 - 0.08 x10 3/uL Final   . Immature Granulocytes Absolute 05/11/2020 0.11* 0.00 - 0.07 x10 3/uL Final   . Absolute NRBC 05/11/2020 0.00  0.00 - 0.00 x10 3/uL Final   . Glucose 05/11/2020 113* 70 - 100 mg/dL Final   . BUN 14/78/2956 18  9 - 28 mg/dL Final   . Creatinine 21/30/8657 1.3  0.7 - 1.3 mg/dL Final   . Sodium 84/69/6295 140  136 - 145 mEq/L Final   . Potassium 05/11/2020 4.2  3.5 - 5.1 mEq/L Final   . Chloride 05/11/2020 104  100 - 111 mEq/L Final   . CO2 05/11/2020 26  22 - 29 mEq/L Final   . Calcium 05/11/2020 10.2  7.9 - 10.2 mg/dL Final   . Protein, Total 05/11/2020 7.1  6.0 - 8.3 g/dL Final   . Albumin 28/41/3244  4.4  3.5 - 5.0 g/dL Final   . AST (SGOT) 16/05/9603 29  5 - 34 U/L Final   . ALT 05/11/2020 45  0 - 55 U/L Final   . Alkaline Phosphatase 05/11/2020 63  38 - 106 U/L Final   . Bilirubin, Total 05/11/2020 0.9  0.2 - 1.2 mg/dL Final   . Globulin 54/04/8118 2.7  2.0 - 3.6 g/dL Final   . Albumin/Globulin Ratio 05/11/2020 1.6  0.9 - 2.2 Final   . Anion Gap 05/11/2020 10.0  5 - 15 Final   . EGFR 05/11/2020 54.4   Final     Patient requested Klonopin which he has taken in past to help with anxiety.  Potentially this could also help with nausea.  Advised patient that I would want him to take this in lowest effective dose (take half of 0.5 mg tablet) for the shortest amount of time.  He has been tapering use of belbuca.  Advised him of risk of respiratory depression with use of narcotic in conjunction with use of benzodiazepines and to avoid dosing these concurrently he is aware of and wishes to proceed.

## 2020-05-14 ENCOUNTER — Encounter (INDEPENDENT_AMBULATORY_CARE_PROVIDER_SITE_OTHER): Payer: Self-pay | Admitting: Physician Assistant

## 2020-05-14 ENCOUNTER — Ambulatory Visit (INDEPENDENT_AMBULATORY_CARE_PROVIDER_SITE_OTHER): Payer: Medicare Other | Admitting: Physician Assistant

## 2020-05-14 ENCOUNTER — Telehealth: Payer: Self-pay | Admitting: Internal Medicine

## 2020-05-14 DIAGNOSIS — N1831 Chronic kidney disease, stage 3a: Secondary | ICD-10-CM | POA: Insufficient documentation

## 2020-05-14 DIAGNOSIS — E785 Hyperlipidemia, unspecified: Secondary | ICD-10-CM

## 2020-05-14 DIAGNOSIS — N1832 Chronic kidney disease, stage 3b: Secondary | ICD-10-CM

## 2020-05-14 DIAGNOSIS — N183 Chronic kidney disease, stage 3 unspecified: Secondary | ICD-10-CM | POA: Insufficient documentation

## 2020-05-14 DIAGNOSIS — I10 Essential (primary) hypertension: Secondary | ICD-10-CM

## 2020-05-14 DIAGNOSIS — I951 Orthostatic hypotension: Secondary | ICD-10-CM

## 2020-05-14 MED ORDER — RAMIPRIL 10 MG PO CAPS
10.0000 mg | ORAL_CAPSULE | Freq: Every day | ORAL | 1 refills | Status: DC
Start: 2020-05-14 — End: 2020-06-02

## 2020-05-14 NOTE — Telephone Encounter (Addendum)
Patient called requesting to have Flomax prescription added. He stated he is currently taking Proscar 5 mg, and used to take Flomax with it but has since stopped. Patient stated it's become increasingly harder to urinate.  I looked through patient's chart while on the phone, and double checked with RN. Patient should have a few refills on both medications. I let patient know he can call [facility] and they should have those refills available to him. Advised patient to call back if refills are no longer available.

## 2020-05-17 ENCOUNTER — Other Ambulatory Visit: Payer: Self-pay | Admitting: Family Medicine

## 2020-05-17 DIAGNOSIS — J4540 Moderate persistent asthma, uncomplicated: Secondary | ICD-10-CM

## 2020-05-19 ENCOUNTER — Other Ambulatory Visit: Payer: Self-pay | Admitting: Neurology

## 2020-05-19 ENCOUNTER — Ambulatory Visit
Admission: RE | Admit: 2020-05-19 | Discharge: 2020-05-19 | Disposition: A | Discharge status: Home / Self Care | Payer: Medicare Other | Source: Ambulatory Visit | Attending: Neurology | Admitting: Neurology

## 2020-05-19 DIAGNOSIS — D329 Benign neoplasm of meninges, unspecified: Secondary | ICD-10-CM | POA: Insufficient documentation

## 2020-05-19 DIAGNOSIS — R110 Nausea: Secondary | ICD-10-CM | POA: Insufficient documentation

## 2020-05-19 DIAGNOSIS — D499 Neoplasm of unspecified behavior of unspecified site: Secondary | ICD-10-CM

## 2020-05-19 MED ORDER — GADOBUTROL 1 MMOL/ML IV SOLN
7.5000 mL | Freq: Once | INTRAVENOUS | Status: AC | PRN
Start: 2020-05-19 — End: 2020-05-19
  Administered 2020-05-19: 7.5 mmol via INTRAVENOUS
  Filled 2020-05-19: qty 7.5

## 2020-05-21 ENCOUNTER — Other Ambulatory Visit: Payer: Self-pay | Admitting: Gastroenterology

## 2020-05-28 ENCOUNTER — Encounter: Payer: Self-pay | Admitting: Internal Medicine

## 2020-06-02 ENCOUNTER — Encounter (INDEPENDENT_AMBULATORY_CARE_PROVIDER_SITE_OTHER): Payer: Self-pay | Admitting: Cardiology

## 2020-06-02 ENCOUNTER — Ambulatory Visit (INDEPENDENT_AMBULATORY_CARE_PROVIDER_SITE_OTHER): Payer: Medicare Other | Admitting: Cardiology

## 2020-06-02 DIAGNOSIS — I951 Orthostatic hypotension: Secondary | ICD-10-CM

## 2020-06-02 DIAGNOSIS — I10 Essential (primary) hypertension: Secondary | ICD-10-CM

## 2020-06-02 MED ORDER — AMLODIPINE BESYLATE 5 MG PO TABS
5.0000 mg | ORAL_TABLET | Freq: Two times a day (BID) | ORAL | 3 refills | Status: DC
Start: 2020-06-02 — End: 2020-11-27

## 2020-06-02 MED ORDER — METOPROLOL SUCCINATE ER 25 MG PO TB24
25.0000 mg | ORAL_TABLET | Freq: Every evening | ORAL | 3 refills | Status: DC
Start: 2020-06-02 — End: 2020-11-27

## 2020-06-02 MED ORDER — RAMIPRIL 10 MG PO CAPS
10.0000 mg | ORAL_CAPSULE | Freq: Every day | ORAL | 3 refills | Status: DC
Start: 2020-06-02 — End: 2020-11-27

## 2020-06-02 NOTE — Progress Notes (Signed)
[facility] PROGRESS NOTE    [facility] FA[facility] OFFICE -CARDIOLOGY  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  Medical Record #: [id number]  Requesting Physician: Modesto Charon, MD      CHIEF COMPLAINT: Hypotension      HISTORY OF PRESENT ILLNESS:    He is a pleasant 69 y.o. male who presents today for follow-up of dysautonomia and hypertension.    When I last had heard about how he was doing in September, I recommended he might even be a candidate for a feeding tube because his nausea was so bad and he had lost so much weight.  Thankfully, he has reversed his course with a reduction in Belbuca dose.  He now does not have nausea and for a while a scopolamine patch helped him as well.  He has begun to walk on the treadmill and is not having any dyspnea whatsoever.  His pedal edema is under great control with compression stockings and he finds they helped him a great deal with regard to any dizziness.  He brings in blood pressures at home, and they are mildly elevated often but we discussed that low blood pressure may be worse because of the risk of falls for him.  His ramipril was down to 10 mg which is a very acceptable dose in my view.  His wife comments that his heart rate is higher in the 90s now and today it was 100 initially but was in the 80s when I was examining him.      PAST MEDICAL HISTORY: He has a past medical history of Aortic regurgitation, Ascending aortic aneurysm (09/19/2018), Avascular necrosis (2008), Bilateral cataracts, Brain tumor (2003), Calculus of kidney, Cold intolerance, Color blindness, Disorder of prostate, DJD (degenerative joint disease), Dry eyes, Dysautonomia, Eczema (03/29/2016), Encounter for blood transfusion (2004), Encounter for hepatitis C screening test  for low risk patient (08/2016), Encounter for pharmacogenetic testing (10/2015), Fibromyalgia, Gastroesophageal reflux disease, Hemorrhoids, Hyperlipidemia, Hypertension, Hypothyroidism, Lateral epicondylitis, Malignant neoplasm of skin, Mitral valve insufficiency, Osteoporosis, Peripheral neuropathy (12/26/2019), Screening PSA (prostate specific antigen) (02/14/2018), Sleep apnea, and Tinnitus. He has a past surgical history that includes EXCISION, LESION (12/03/2008); Colonoscopy (2008); Tonsillectomy and adenoidectomy (as a child); Orchiopexy (Left, 1980); Subtotal resection of posterior sagittal meningioma (12/05/2002); Tooth extraction (Right, 06/2018); EGD, COLONOSCOPY (N/A, 04/16/2019); ECHOCARDIOGRAM, TRANSTHORACIC (04/29/2005); and ECHOCARDIOGRAM, TRANSTHORACIC (10/17/2016).    ALLERGIES:   Allergies   Allergen Reactions   . Bextra [Valdecoxib] Rash       MEDICATIONS:   Current Outpatient Medications   Medication Sig   . albuterol sulfate HFA (ProAir HFA) 108 (90 Base) MCG/ACT inhaler INHALE ONE PUFF BY MOUTH EVERY FOUR TO SIX HOURS AS NEEDED FOR WHEEZING   . AMITRIPTYLINE HCL PO Take 40 mg by mouth nightly      . amLODIPine (NORVASC) 5 MG tablet Take 1 tablet (5 mg total) by mouth 2 (two) times daily   . Belbuca 150 MCG per buccal film daily      . clonazePAM (KlonoPIN) 0.5 MG tablet Take 1 tablet (0.5 mg total) by mouth daily as needed for Anxiety   . docusate sodium (Colace) 100 MG capsule Take 100 mg by mouth 2 (two) times daily   .  ezetimibe (ZETIA) 10 MG tablet Take 1 tablet (10 mg total) by mouth daily   . finasteride (PROSCAR) 5 MG tablet Take 1 tablet (5 mg total) by mouth nightly   . hydrocortisone (CORTEF) 5 MG tablet Take 15 mg in am, 5 mg in early afternoon and 5 mg at 7 pm. Allow for extra doses in case of stress, illness, fever. (Patient taking differently: Take 20 mg in am, 5 mg in early afternoon. Allow for extra doses in case of stress, illness, fever.  )   . levothyroxine (SYNTHROID) 150 MCG  tablet TAKE ONE TABLET BY MOUTH ONE TIME DAILY AT 6 PM   . metoprolol succinate XL (Toprol XL) 25 MG 24 hr tablet Take 1 tablet (25 mg total) by mouth nightly   . modafinil (PROVIGIL) 100 MG tablet Take 50 mg by mouth daily PRN     . montelukast (SINGULAIR) 10 MG tablet TAKE 1 TABLET BY MOUTH EVERY NIGHT AT BEDTIME AS DIRECTED   . Motegrity 2 MG Tab Take 1 tablet by mouth daily   . pantoprazole (PROTONIX) 40 MG tablet Take 40 mg by mouth daily   . prochlorperazine (COMPAZINE) 10 MG tablet Take 5 mg by mouth as needed      . promethazine (PHENERGAN) 25 MG suppository every 6 (six) hours as needed      . psyllium (Metamucil) 0.52 g capsule Take 1.6 g by mouth 2 (two) times daily   . ramipril (ALTACE) 10 MG capsule Take 1 capsule (10 mg total) by mouth daily   . senna (SENOKOT) 8.6 MG tablet Take 1 tablet by mouth 2 (two) times daily      . simvastatin (ZOCOR) 20 MG tablet Take 1 tablet (20 mg total) by mouth nightly   . SYMBICORT 80-4.5 MCG/ACT inhaler Inhale 2 puffs into the lungs two times daily (Patient taking differently: 2 puffs 2 (two) times daily Have patient rinse mouth after use  )   . tamsulosin (FLOMAX) 0.4 MG Cap Take 1 capsule (0.4 mg total) by mouth Daily after dinner   . Testosterone 20.25 MG/ACT (1.62%) Gel Apply 4 actuations daily (2 on each shoulder)   . zolpidem (AMBIEN) 10 MG tablet Take 1 tablet (10 mg total) by mouth nightly as needed for Sleep        FAMILY HISTORY: family history includes Asthma in his daughter; Dementia in his mother; Migraines in his sister; Tuberculosis in his mother.    SOCIAL HISTORY: He reports that he has never smoked. He has never used smokeless tobacco. He reports current alcohol use. He reports current drug use. Drug: Marijuana.    PHYSICAL EXAMINATION    Visit Vitals  BP 136/76 (BP Site: Left arm, Patient Position: Sitting, Cuff Size: Small)   Pulse 92   Ht 1.753 m (5\' 9" )   Wt 78.5 kg (173 lb)   BMI 25.55 kg/m       General Appearance:  A well-appearing male in no  acute distress.    Skin: Warm and dry to touch, no apparent skin lesions, or masses noted.  Head: Normocephalic, normal hair pattern, no masses or tenderness   Eyes:  conjunctivae and lids unremarkable.  ENT:   No pallor or cyanosis.     Neck: JVP normal, no carotid bruit   Chest: Clear to auscultation bilaterally with good air movement and respiratory effort and no wheezes, rales, or rhonchi   Cardiovascular: Regular rhythm, S1 normal, S2 normal, No S3 or S4.   No murmur.  No gallops or rubs detected   Abdomen: Soft, nontender, nondistended, with normoactive bowel sounds. No organomegaly.  No pulsatile masses, or bruits.   Extremities: Warm without edema. No clubbing, or cyanosis. All peripheral pulses are full and equal.   Neuro: Alert and oriented x3. No gross motor or sensory deficits noted, affect appropriate.              LABS:   Lab Results   Component Value Date    WBC 14.51 (H) 05/11/2020    HGB 17.2 (H) 05/11/2020    HCT 50.4 (H) 05/11/2020    PLT 246 05/11/2020     Lab Results   Component Value Date    GLU 113 (H) 05/11/2020    BUN 18 05/11/2020    CREAT 1.3 05/11/2020    NA 140 05/11/2020    K 4.2 05/11/2020    CL 104 05/11/2020    CO2 26 05/11/2020    AST 29 05/11/2020    ALT 45 05/11/2020     Lab Results   Component Value Date    MG 1.9 02/12/2019    TSH 0.155 (L) 11/27/2019    HGBA1C 6.2 (H) 11/27/2019     Lab Results   Component Value Date    CHOL 177 02/13/2018    TRIG 237 (H) 02/13/2018    HDL 54 02/13/2018    LDL 76 02/13/2018       Impressions:    1.  Orthostasis, complex and related probably to his underlying fibromyalgia and probable dysautonomia.  Improved recently with better nutrition and resolution of nausea.    2.  Hypertensive heart disease on echo 09/10/2018 he has LVH and mild aortic root dilatation.    3.  Dyslipidemia-simvastatin use.    4.  Partial resection of meningioma in 2004.    5.  Panhypopituitarism-on replacement therapy.    6.  Peripheral neuropathy and some cognitive  issues.    7.  Fibromyalgia and chronic fatigue syndrome.    Recommendations:    1.  No changes to medications at this time.    2.  Continue gradual increase in exercise and thigh-high compression stockings with avoidance of dehydration or sodium depletion.    3.  I will see him again in 6 months and we can discuss when to do his next echo to look at his his aorta size.          SIGNED:    Harvel Quale, MD          This note was generated by the Dragon speech recognition and may contain errors or omissions not intended by the user. Grammatical errors, random word insertions, deletions, pronoun errors, and incomplete sentences are occasional consequences of this technology due to software limitations. Not all errors are caught or corrected. If there are questions or concerns about the content of this note or information contained within the body of this dictation, they should be addressed directly with the author for clarification.

## 2020-06-03 ENCOUNTER — Telehealth: Payer: Self-pay | Admitting: Internal Medicine

## 2020-06-03 NOTE — Telephone Encounter (Addendum)
August 28 MRI showed strain in right trapezius, now pain is radiating to his arm, wants to be seen, please triage.    BRODERICK NOTE: I spoke with the patient today.  He notes that his nausea symptoms are about 80% improved with cutting back on his Belbuca dose.  He also feels like his orthostatic hypotension is also better with the lower dose of Belbuca.  He reports that his pain management doctor has put him on Celebrex 100 mg twice a day and this has helped also immensely with his pain.  He has been taking this for the last 6 days.  No records are available from his pain management NP Stevie Kern but these will be requested..    Patient notes he has had some right-sided trapezius pain that has been chronic. NP Stevie Kern ordered an MRI of T spine in late August which showed as follows    IMPRESSION: 1. Minor diffuse degenerative changes in the thoracic spine. 2. Findings suspicious for a strain of the lateral aspect of the right trapezius muscle. This is incompletely evaluated on this thoracic spine MRI. If symptoms persist, evaluation with a chest wall MRI is recommended.     This morning patient had some right-sided radiation of the pain into his right upper extremity but denies any numbness or weakness or neck pain.  Notes he has been raising his arm to walk on the treadmill.  I told him that if he is not having any numbness or weakness I might recommend he come in and see me and we can examine him and discuss his symptoms and consider imaging or physical therapy if needed.  He notes that the Celebrex has been quite helpful and requests a refill.  I advised him on risks of side effects of the Celebrex and he is aware.  He will schedule an appointment to see me.  I will request records from [facility] recent evaluation.[name]

## 2020-06-04 ENCOUNTER — Telehealth: Payer: Self-pay | Admitting: Internal Medicine

## 2020-06-04 NOTE — Telephone Encounter (Signed)
COVID-19 Questionnaire:  Last updated: Dec 27, 2018    Fever: no  Cough: no  Dyspnea: no  Additional Symptoms: denies chills, shaking chills, sore throat, headache, body aches, loss of taste or smell, vomiting or diarrhea.  Close contact with a laboratory confirmed COVID-19 person: no  Healthcare personnel or public safety officer:  no  Immunocompromised:  no immunocompromising conditions  Long term facility or nursing home resident: No  Sole provider for dependent at high risk:  no  Pregnant 3rd trimester or High Risk Pregnancy: No    Patient instructed to wear a mask into the building, call when parked prior to coming into the office to ensure there is no one else in the waiting area, and to return call to the office should any symptoms appear prior to appointment time.

## 2020-06-05 ENCOUNTER — Encounter: Payer: Self-pay | Admitting: Internal Medicine

## 2020-06-05 ENCOUNTER — Ambulatory Visit (INDEPENDENT_AMBULATORY_CARE_PROVIDER_SITE_OTHER): Payer: Medicare Other | Admitting: Internal Medicine

## 2020-06-05 VITALS — BP 164/84 | HR 84 | Temp 98.2°F | Resp 15

## 2020-06-05 DIAGNOSIS — F1120 Opioid dependence, uncomplicated: Secondary | ICD-10-CM | POA: Insufficient documentation

## 2020-06-05 DIAGNOSIS — M797 Fibromyalgia: Secondary | ICD-10-CM

## 2020-06-05 DIAGNOSIS — E2749 Other adrenocortical insufficiency: Secondary | ICD-10-CM

## 2020-06-05 DIAGNOSIS — D329 Benign neoplasm of meninges, unspecified: Secondary | ICD-10-CM | POA: Insufficient documentation

## 2020-06-05 DIAGNOSIS — S46811A Strain of other muscles, fascia and tendons at shoulder and upper arm level, right arm, initial encounter: Secondary | ICD-10-CM

## 2020-06-05 MED ORDER — CELECOXIB 100 MG PO CAPS
100.0000 mg | ORAL_CAPSULE | Freq: Two times a day (BID) | ORAL | 0 refills | Status: DC | PRN
Start: 2020-06-05 — End: 2020-07-06

## 2020-06-05 MED ORDER — VALACYCLOVIR HCL 500 MG PO TABS
500.0000 mg | ORAL_TABLET | Freq: Every day | ORAL | 0 refills | Status: DC
Start: 2020-06-05 — End: 2020-08-06

## 2020-06-05 NOTE — Progress Notes (Addendum)
Chief Complaint   Patient presents with   . 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      Patient reports a chronic issue with some right-sided upper back pain that has waxed and waned since approximately 2006.  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  Pain is well localized to his medial scapula on the right and aspect of his trapezius and there is some occasional radiation into the right upper extremity in the radial aspect.  He denies any neck pain.  Pain is exacerbated with pulling motions and activity of the right shoulder.  Somewhat exacerbated when he was walking on his treadmill with his arms raised to hold onto the rail. He did see a pain management physician NP Stevie Kern for this in August (no records at this time) and had an MRI with results as follows:    MRI THORACIC SPINE WITHOUT CONTRAST: . IMP:  1. Minor diffuse degenerative changes in the thoracic spine 2. Findings suspicious for a strain of the lateral aspect of the right trapezius muscle. This is incompletely evaluated on this thoracic spine MRI. If symptoms persist, evaluation with a chest wall MRI is recommended.    He has not yet followed up with his pain management physician to discuss this but request my opinion on implications of this He has never been treated with physical therapy.  He has been treated in the past with trigger point injections which usually give him a few weeks of relief but then the pain recurs.     He notes that recently he has been feeling "80% better" in regards to his nausea and fatigue and overall function since cutting back on Belbuca from 450 mg twice a day to 150 mg twice a day.  He feels like his nausea is improved and energy is improved.  He is beginning to do more walking and exercise.  He says he feels better now "than in years."    He also notes that  pain management NP Stevie Kern had given him Celebrex 100 mg twice daily and that this is also significantly helped with his pain and function.  He asks for a refill on the Celebrex.  He has no history of chronic kidney disease or GI bleed.  Thus far is tolerating Celebrex well    He has a complex medical history and is followed by a number of specialists to include pain management cardiology endocrinology, gastroenterology, neurology, infectious disease among others.     He has done a lot of research on fibromyalgia and treatment for this and has read reports about beneficial treatment with Valtrex for this.  He has no history of HSV.  He is interested in experimental treatment with Valtrex to see if this might also help with his symptoms.  He had recently consulted with an infectious disease physician Dr. Romilda Garret in this regard (no records at this time).     Active Problems  Patient Active Problem List    Diagnosis Date Noted   . Secondary adrenal insufficiency 01/17/2017     Priority: High     - 04/14/20: eval w Dr. Tamera Punt: Hypopituitarism in the setting of brain surgery for meningioma. Patient is currently on [facility] for (AI), levothyroxine, testosterone and growth hormone replacement. Will order an ACTH stimulation tests to  see how much of adrenal reserve he has. As far a steststerone (T), his T levels are sub-optimal and I recommended a target T level in the 440 to 500 ng/dL for optimal efficacy. Will titrate the dose of the topical androgen to achieve that goal. Repeat T level in few weeks after dose totration He can return in few months or as needed.  - 04/26/18: Dr. Beckie Salts [facility]): initial eval showed possible borderline glucocorticoid deficiency.  He has been taking supraphysiologic doses of hydrocortisone since 2017.  We do not think he has a hydrocortisone absorption problem based on random serum cortisol and 24-hour values.  We believe episodes of abd pain and nausea improved with SDS because  of anti-inflammatory effects.  We are unable to generate a unifying endocrine dx that explains sx.  Pt understands dangers of continuing supraphysiologic glucocorticoids but reluctant to reduce for fear of worsening illness.  We do feel other hormone replacement doses are more appropriate.  We do not rec changes. Pt asked for referral to fibromyalgia specialist.  Pt with h/o primary hypothy has empty sella and biochemical e/o hypogonadism and GH deficiency.  Originally, ACTH test borderline, but now after long-term glucocorticoid tx he has obvious central AI.  His thyroid and GH doses are if anything on high side.  We cannot attrribute sx to underdosing.  Explained this to Dr. [name] and have contacted [facility] to see if fibromyalgia expert or clinical trial are available .  - 09/29/17 Dr. Tamera Punt endo: Hypopituitarism in setting of brain surgery for meningioma. Pt currently on HC for (AI), levothyroxine, testosterone and GH replacement. The question as I d/w him is whether he is using HC to treat non specific sx or what he refers to as "quality of life" or whether he does need high doses due to some issue w HC metabolism. I explained to him sx he experiences are not specific for adrenal disease and there is concern about chronic over exposure to corticosteroid tx. I encouraged him to taper dose of HC to physiologic dose, in range of 20 mg in am and 10 mg in pm. One can also use dose that is based on body surface. In case of suspicion of "adrenal crisis", I suggested he use one injection of Solucortef rather than high oral doses of HC for several days. I also recd opinion w Dr. Leonides Grills, Chief, Endocrinology at [facility], in order to discuss adrenal issue. I'm happy to continue to see him at [facility]. I also explained that have not prescribed GH replacement for adults so he will continue to follow with [facility]. He can return in few mos or as needed.  - 09/06/17: eval w Endocrine Dr. Eyvonne Mechanic [facility]): Although after we dx  secondary AI, GH deficiency, hypogonadotropic hypogonadism, and primary hypot a/w a partially empty sella on imaging, and he had initial + response to replacement as time has gone on, he has not done well and episodes where he gets very ill w muscle aches like he always has flu nausea, vomiting, LH and in general on physiologic hydrocortisone, he awakes in am with discomfort, particularly in back , and after meds it will be several hrs until he has energy to get up and do minor tasks. Around [place], he was ill with viral illness for 3 days and had crisis. He was tx w 100 mg of hydrocortisone orally every 8 hrs, and sx resolved. He subsequently tapered on oral hydrocortisone and in last 3-4 days has been  taking between 130-110 mg total per day and continues to feel well as he tapering this. We are collecting a 24-hr urine free cortisol on this dose. His other levels are interesting as well. He is on GH at 0.3 mg a day. In past, this had IGF-1's at upper end of nl range to mildly elevated, now Z-score is just +1.28 and despite taking AndroGel cream on daily basis, testosterone level was 19, whereas in past this was nl. He is sure he had not forgotten testosterone. His thyroid tests similar to past with a nl free T4 of 1.5 and TSH mildly suppressed. The current ACTH is less than 1. A/P  #1 Empty sella w pituitary insufficiency  #2 Secondary AI  It is unusual that he feels well now after several days of high-dose hydrocortisone. Either he is malabsorbing hydrocortisone, he is super metabolizing hydrocortisone, he has developed cortisol resistance, or he has undx steroid-sensitive disease. There is really no evid for other disease with other testing done including multiple rheum antibodies. I did order genetic gene interaction testing and waiting for review of this from pharmacologist to see if suggestion of metabolism abnormalities re various hormones. The 24-hour urine cortisol should give hint regarding  absorption. Plan we have developed is to continue to slowly taper oral hydrocortisone over next several days to weeks, and I would note a tapering schedule depending on clinical response. I will get a baseline bone density and body composition, and check cortisol level morning 2-3 hrs after he took 50 mg of Cortef, and I am going to repeat testosterone 1-2 hrs after he applied AndroGel. #3 Secondary hypogonadism, on replacement : Unclear why testosterone is so different than before where we test this. It may be lab error, but it is unlikely he did not apply testosterone. #4 GH deficiency:  unclear why same dose is resulting in lower IGF-1, but increase dose to 0.4 mg per day.  #5 Primary hypot, on replacement  There is likely pituitary component, and will maintain replacement keeping FT4 stable at 1.5.  #6 Cognitive dysfunction  evaluated a year ago w/o specific dx. He feels now after several days of higher doses of hydrocortisone thinking is clearing. Mild ascending aortic dilation and aortic insufficiency  Would rec'd repeating echo next year, #10 Meningioma  unchanged on MRI from 2008 and confirms slight increase from 2017. #11 Muscle pain  This has resolved with hydrocortisone. I doubt there is underlying muscle disease,   - 08/31/17: Pt email: advised by pcp to take 100 mg of hydrocortisone Q8H for three days and then double usual dose (25-30 mg/day) until I returned to [facility]. The rescue dosage worked like Pharmacist, community. I began to feel better almost immediately and now nausea, loss of appetite, dizziness with standing, chills, cold feet, and perceived cognitive difficulties have markedly improved. I cannot remember last time I felt this good!   - 08/25/17: Dr. Toni Amend: pt with possible adrenal insuff while under stress. Trial of hydrocortisone to 100mg  tid for 24-48 hours per [facility].  - 11/23/16: eval w Endocrine Dr. Tawanna Cooler [facility]): due for reassessment of FT4 and IGF 1 in 4 weeks after changing dose on  10/27/16.  Pursue recs for insomnia from psych. MRI did not show signs of dementia. PFT nl. FU 6-12 mos       . Chronic fatigue fibromyalgia syndrome 01/24/2013     Priority: High     Aug 2006 noted onset of severe fatigue and myalgias. Extensive eval for  CT and autoimmune disease at [facility]. Discovered to have inadequate thyroid and low free testosterone. He was eval by endocrine ultimately dx w fibromyalgia / chronic fatigue syndrome prescribed various meds including provigil, cymbalta, gabapentin and pregabalin, and elavil (all w/o improvement).     - 03/30/20: eval w Dr. Romilda Garret (ID): Chronic fatigue brain fog fibromyalgia unresponsive to medical therapies.  Orthostatic hypotension.  Glossitis.  Poor sleep quality.  Recommend labs for the research follow-up in 4 weeks  - 01/21/20: eval w Rheum Dr. Mellody Dance - FMS - has not responded to medical therapies. No new options have come to clinical care since FDA approval of duloxetine, pregabalin, milnacripran for these symptoms Dysautonomia - commonly associated with FMS and other forms of central sensitivity state. Not known to be a cause and effect relationship.  - 10/03/18: email from Dr. Jon Billings: [facility] for paraneoplastic antibodies that can be a/w peripheral neuropathy negative.  I strongly suspect numbness in feet developed is prediabetes (A1C 6.3). Lose weight and exercise. The only lab of concern is B6 over 80. I don't feel this is causing neuropathy. Elevated B6 has been shown to cause neuropathy, primarily small fiber, in animal and likely does in pts as well.  I get concerned if level is >100, but 80s not far from that. If you are taking B6 (I.e, pyridoxine), I suggest stop. I also want to emphasize though we chased this, I don't believe this is cause (prediabetes, B6, or small fiber neuropathy) of majority of your sx. You have chronic fatigue syndrome and I would continue to advise you to seek care with specialty center for this challenging disorder.   -  Skin biopsy 08/06/18: Nl epidermal nerve fiber density in two distal sites, but markedly reduced over proximal thigh. [place] red negative. SUMMARY: Unclear significance of reduced density of IENFD over proximal thigh. May reflect meralgia paresthetica, but not apparent from pt's sx. No indication of length-dependent small fiber neuropathy on testing. If present, extremely mild and restricted to feet. If no meralgia paresthetica, could consider Sjogren's testing, if not done, and anti-Hu for non-length dependent small fiber neuropathy.  - 08/06/18: eval w Dr Bosie Clos [facility]): h/o fatigue and myalgias in 2006 after having viral prodrome continues to be c/w chronic fatigue.  Additionally, insomnia with difficulty falling sleep maintaining sleep.  He reports sleeps no more than 1-1.5 hrs at time.  This likely contribute sig to fatigue and somnolence.  Rec trial of amitriptyline.  Amitriptyline may also benefit chronic pain.  For chronic pain/myalgias he is taking tramadol, ( which we rec stopping ) buprenorphine and clonazepam.  We advised him to minimize exposure to opiates and BZD as these can make somnolent fatigued and be factor in causing cognitive issues.  Is not clear what caused his paraphasic errors at [facility] 07/30/18, but suspect fatigue / meds played role.  He reports 1 yr of numbness and tingling in feet.  Exam sig only for mildly decreased sensation to temp bilateral feet - intact sensation pinprick.  He also reports episodes of nausea dry heaving.  To eval for possible small fiber neuropathy causing peripheral numbness or tingling and possible gastric dysmotility suggest skin bx which he elected to pursue. In addition to episodes of nausea and dry heaving.  He also reports having multiple episodes of loose stools and abd cramping.  Suspect component of IBS, but rec further eval w GI.  Plan.  For insomnia in setting of chronic fatigue -amitriptyline  10 mg at night for 1 week,  then every week, increase by 10 mg to a dose of 40 mg.  Rec stopping tramadol skin biopsy for WU of small fiber neuropathy.  Consider eval at [facility] for chronic fatigue.  GI referral for eval of loose stools and cramping.  Continue w neurosurgery or [facility] for meningioma.  - 09/08/17: eval with Rheum ([facility]): A/P #1 Pituitary insufficiency  #2 Secondary AI  #3 Myalgias  #4 Sicca sx: suspicion is pt has underlying rheum process that accounts for steroid responsive sx here is exceedingly low. The constellation of sx he describes during a crisis not typical for autoimmune disease. Inflammatory myopathy would typically present with muscle weakness, and muscle strength preserved, and he is endorsing myalgia. I reviewed pic he brought in with him for episodes in hands / feet, and I am at loss. It does not look like CRPS, erythemalgia, or Raynaud's. He has had extensive testing here so far including ANA, ENAs, complements, all within nl limits as are inflammatory markers. To complete eval, I think we can check cryoglobulins, ANCAs, CCP, and pain, which seems myofascial than intra-articular or muscular, would rec obtaining x-rays of spine as well as feet ankles. I will review results on portal.       . Orthostatic hypotension 12/27/2019     Priority: Medium     - eval w Cardiology Dr. Tami Ribas 06/02/20: 1. Orthostasis, complex and related probably to his underlying fibromyalgia and probable dysautonomia.  Improved recently with better nutrition and resolution of nausea. 2. Hypertensive heart disease on echo 09/10/2018 he has LVH and mild aortic root dilatation. 3. Dyslipidemia-simvastatin use. 4. Partial resection of meningioma in 2004. 5. Panhypopituitarism-on replacement therapy. 6. Peripheral neuropathy and some cognitive issues. 7. Fibromyalgia and chronic fatigue syndrome. Recs: 1.  No changes to medications at this time. 2.  Continue gradual increase in exercise and thigh-high compression  stockings with avoidance of dehydration or sodium depletion. 3.  I will see him again in 6 months and we can discuss when to do his next echo to look at his his aorta size.   - eval w cardiolog NP 05/14/20: 1.Decrease his Ramipril form 20mg  to 10mg  to assist with correcting orthostasis. If his sitting BP is or above, he can take the additional tab of Ramipril 2.         Continue hydration, liberalize salt intake, compression stockings 3.Continue monitoring blood pressure and heart rate at home. 4.         Continue to followup with his neurologist, PCP and GI specialists 5.Follow-up with Dr.Nayak as scheduled in approximately 3 weeks  - eval w [facility] NP 03/16/20: Complex symptomatic orthostatichypotension with spells of nausea and fatigue, likely related to fibromyalgia and probable dysautonomia.  He has had workups at [facility] and sees specialists locally. Hypertension, not ideally controlled although would not be overly aggressive in lowering blood pressures due to #1. Longstanding mild AV regurgitation withamildly dilated asc aorta, stable by most recent echo Jan 2020. on statin and Zetia. Most recent LDL 76 2019. Prior partial meningioma resection in 2004. Panhypopituitarism. Hypothyroidism. Peripheral neuropathy. Cognitive issues. Fibromyalgiaandchronic fatiguesyndrome,which remainquite debilitating. REC: 1. Continue current meds. 2. Continue monitoring pressure and rate 3. Follow-up with Dr. Tami Ribas in 3 to 4 months, sooner as needed.   - eval w [facility] NP 02/05/20 Complex symptomatic orthostatichypotension, likely related to fibromyalgia and probable dysautonomia.  Several recent changes to antihypertensive  regimen with subsequent improvement both in blood pressures and symptoms. Hypertension, controlled on current regimen although still with high blood pressure spikes. Longstanding mild aortic valve regurgitation with a  mildly dilated ascending aorta, stable by most recent echocardiogram in January 2020. Hyperlipidemia, on statin and Zetia. Most recent LDL 76 in 2019. Prior partial meningioma resection in 2004. Panhypopituitarism. Hypothyroidism. Peripheral neuropathy. Cognitive issues. Fibromyalgia and chronic fatigue syndrome, which are quite debilitating. RECS: 1. Increase Toprol to 50 mg daily.  He will take at bedtime. 2. Continue remainder of cardiac medications, including higher dose of amlodipine. 3.  Continue use of thigh-high compression stockings.  These have helped significantly. 4. Continue monitoring pressure.  5. Telehealth follow-up with an APP in 3 weeks, sooner as needed.  - eval w cardiology Dr. Tami Ribas 01/16/20: 1.  Orthostasis, complex and related probably to underlying fibromyalgia and probable dysautonomia.  I explained salt loading despite hypertension may be useful to him and that 1 out of 5 patients is sodium sensitive with hypertension.  If his hypertension worsens he can of course back off.  I told him thigh-high compression stockings are more effective than anything knee-high and he will try those.  He already has rx from [facility], NP.  He is going to insist on thigh-high.  We also discussed restarting a BB but a beta 1 selective beta-blocker such as metoprolol succinate 2.  heart disease on echo 09/10/2018 he has LVH and mild aortic root dilatation. 3.  Dyslipidemia-simvastatin use. 4.  Partial resection of meningioma in 2004. 5.  Panhypopituitarism-on replacement therapy. 6.  Peripheral neuropathy and some cognitive issues. 7.  Fibromyalgia and chronic fatigue syndrome. Rec: 1.  Thigh-high compression stockings 20 to 30 mm graded 2.  Begin metoprolol succinate 25 mg daily.  We may need to increase that to twice daily ultimately. 3.  Loosen salt restriction.  He will even attempt careful salt loading. 4.  He has a follow-up visit in a couple of weeks with the APP.         [name] Irritable bowel syndrome  with constipation / Cyclic vomiting syndrome 02/11/2019     Priority: Medium     - 05/21/20: Normal solid-phase gastric emptying study  - 04/30/20 - eval w Dr Jodie Echevaria: He has been for infusions of IV fluid 3 times since last visit for nausea and vomiting.  Continued on Motegrity TCA and xifaxan -though the meningioma is growing its not thought to be the cause.  He is starting a higher dose of hydrocortisone due to adrenal insufficiency.  He only has a few hours a day with no nausea.  He actually has not vomited but the nausea is unrelenting.  Neurology gives him granisetron every few weeks.  Amitriptyline.  ACTH challenge was done by endocrinology.  His most to start a scopolamine patch today and consider holding Belbuca pain meds.  Continue scopolamine patch continue pantoprazole continue Zofran Phenergan follow-up 4 weeks  - 04/23/20 evaluation with Dr. Jodie Echevaria: At last visit held the nighttime TCA and this did not make a difference.  He recently started it back up because of insomnia and this helped.  Held Motegrity for breath test but symptoms recurred so he did restart it and the Motegrity helped.  He was quite nauseous and went to neurologist and was given granisetron IV and another migraine medication a markedly improved.  Still uses Phenergan suppository and only with occasional breakthrough.  Meningioma is thought to have grown again and borderline in size for resection.  He is going back in a month where he has no cycles.  Continue pantoprazole Zofran promethazine start amitriptyline 25 mg nightly continue Motegrity start Xifaxan 3 times a day for IBS with diarrhea 14 days.  Follow-up 6 weeks  - 03/02/20: eval w Dr. Jodie Echevaria: He is unable to repeat SIBO test due to family issues.  He stopped monitor antihypertensive due to this.  Nausea remains bad in morning but better in afternoon and evening.  Phenergan as needed.  This does help.  He does think amitriptyline helps sleep.  Bowel movements controlled a "life saver" on  Motegrity.  Continue Phenergan Zofran and Compazine stop amitriptyline.  Option of holding TCA for now and seeing if improvement on office to do latter for now.  Continue Motegrity follow-up 3 weeks  - 01/17/20: TCA was decreased due to some orthostasis.  Linzess held and started on [employer].  He feels much better for several reasons.  The dysautonomia and hypotension correlated with nausea.  Coreg was held today no nausea but still some orthostasis but less severe.  Compression stockings and salt recommended.  Recommend start Motegrity follow-up 6 weeks  - 12/20/19: eval w Dr. Jodie Echevaria: He was continued on Linzess TCA antiemetics Protonix at last visit.  He was diagnosed with SIBO and given trial xifaxan.  He did not have much improvement with it.  He continued to have symptoms of nausea and dizziness.  Phenergan suppository.  Some orthostatic hypotension.  He does not have POTS but may be dysautonomia based on male work-up.  He does have adrenal insufficiency.  He continues to complain of loss of appetite fatigue weight loss.  He was somewhat dehydrated but able to rally and drink more.  He was having constipation and nausea so changed the Linzess to Motegrity which he finds helpful.  Continue Zofran twice daily as needed.  Continue Compazine as needed.  He was doing well on TCA but with the orthostasis we will cut down the dose to 20 mg nightly. Stop Xifaxan and linzess - start motegrity. Start phenergan suppository.  Decreased dose TCA increase fluids recommend cardiology visit.  Follow-up 4 weeks  - [facility] / EGD 04/16/19: A few medium mouth diverticula in the sigmoid colon.  The entire colon appeared normal.  Biopsies were taken  EGD Dr Jodie Echevaria 04/16/19: Examined esophagus normal.  Biopsies taken.  Small hiatal hernia present.  Patchy mild inflammation found in gastric body in gastric antrum. PATH: Normal gastric antral mucosa negative for gastritis H. pylori herpes viral cytopathic changes intestinal metaplasia or  dysplasia.  Gastric body mucosa showing mild superficial erosive injury negative for inflammation Helicobacter herpes viral cytopathic changes intestinal metaplasia or dysplasia.  GE junction biopsy: Possible reflux GE mucosa showing chronic inflammation and hyperplastic changes negative for intestinal metaplasia or dysplasia.  Mid esophagus biopsy: Esophageal candidiasis squamous mucosa showing superficial acute inflammation and intraepithelial fungal elements consistent with Candida species   - 12/18/18: eval w GI Dr. Donell Sievert: He looks better than at last visit.  The nausea reflux are better.  He may have fibromyalgia and possibly AI being managed by appropriate specialist.  He continues to struggle with fatigue.  An extensive work-up for LFTs unremarkable besides fatty liver.  He only needs pantoprazole twice a week.  He has Compazine and Phenergan for bad episodes but has not needed it.  He remains on hydrocortisone.  The fatigue continues but manageable.  Trying to get back on Provigil but awaiting approval.  No pain or changes in  bowel habits.  Cramps are better.  He added a yogurt shake and fruit and prunes.  Rec continue current GI meds follow-up in 6 months  - 10/09/18 eval w GI Dr. Donell Sievert: At last visit given Phenergan and pantoprazole.  [place] with fatty liver and left renal cyst.  Labs negative HCV HBV.  Neg for other causes of abnl LFT.  He has been seen at [facility] and a nerve biopsy done with nonspecific findings.  He is watching sugar intake.  He reports less nausea less feelings of incapacitation.  He is on new pain med and this works well.  He is on Elavil at bedtime and sleeps better.  He does feel better still fatigue but try to fight through it.  He is seeing endocrinologist at [facility] and [facility].  Nausea and reflux are better with treatment.  Rec low carbohydrate diet.  Lean protein and low-fat diet.  Gave him names of local endocrinologist and rheum to consult with closer to  home.  Follow-up in 3 months         . Aortic valve insufficiency 09/07/2018     Priority: Medium     - 09/07/18: eval w cardiology Dr. Harlow Asa: recommend echo, continue current antihypertensives, FU 1 year  -ECHO at [facility] 2018: described mild AR and mildly enlarged aortic root LV nl     . IFG (impaired fasting glucose) 02/13/2018     Priority: Medium     - A1C 6.2 as of 11/27/19  - A1C 6.3 09/06/18  - A1C 6.2 02/14/18 FBG 104     . History of insomnia 01/17/2017     Priority: Medium     Chronic, with previous evaluations with multiple providers    - appears pt has received clonazepam from Dr. Theodoro Grist on 04/10/20, 03/17/20 and 01/27/20  - 02/09/20: eval w Dr. Theodoro Grist: Continues to benefit from taking melatonin and amitriptyline at night.  Occasionally he will take Ambien.  Symptoms well controlled with combination of melatonin and amitriptyline 50 mg at night.  Complete sleep study in future.  Continue modafinil follow-up 5 months  - Feb1, 2021: Eval with Dr. Eilleen Kempf: Sx well controlled with combination melatonin 10 mg and amitriptyline 50 mg at night.  Complete sleep study once pandemic has subsided.  Continue modafinil follow-up 6 months  - now seeing CBT therapist Dr. Lanier Ensign  - 10/31/16: eval w sleep medicine Dr. Ala Dach  [facility]): reviewed records from sleep study Sept 2017 and CPAP in Oct 2017 ... My opinion  his OSA is not sig enough to require effort on using CPAP consistently ... Particularly since he reports not having found CPAP helpful or restful, best to set it aside and at some point another sleep lab study and titration may be helpful.      . Growth hormone deficiency 03/29/2016     Priority: Medium     - Jan 2019: eval w Dr. Madie Reno [facility]) see recs under adrenal insufficiency in problem list  -- 10/31/16: eval w Endocrine Dr. Tawanna Cooler [facility]): increase GH to 0.4 mg daily  - 08/26/16:  eval w Dr. Tawanna Cooler [facility]): suspect that these issues will improve longer he is on [facility] .Marland KitchenMarland Kitchen Therefore am not  concerned with him taking 7.5 mg -10 mg of pred a day or equiv if that is what he needs in next several months.          . History of meningioma of the brain 11/09/2015  Priority: Medium     12/09/02: s/p R parieto-occipital parasagittal craniotomy for 85-90% resection of R pareito-occipital falx meningioma that originally measured 61 x 24 mm    - 04/29/20: eval w Dr. Nilda Calamity: His meningioma seems to be progressive in size over the past year.  In my estimation although this is not noted on the report.  Is asymptomatic.  I would like him to get a repeat scan in about 6 months to ensure there is no acceleration in growth.  His neuro symptoms of nausea are of uncertain etiology.  I reiterated that the meningioma is not causing any nausea or dizziness.  Is a chronic occluded superior sagittal sinus which is not likely to be true triggering symptoms either but is relevant so I would like him to have an evaluation from ophthalmology to exclude increased intracranial pressure.  He had a GI work-up that was negative.  He is not endorsing headaches or neck pain but he does have severe trigger point in the right shoulder girdle that could be triggering some nausea potentially.  Advised him to get a trigger point injection for that.  Would be reasonable for him to try Emgality as a migraine prophylactic with very little downside given his intractable symptoms currently.  Follow-up in about 6 weeks  - 03/06/20: eval w Dr. Nilda Calamity: He is due for MRI for meningioma which does border on posterior cingulate cortex which is important in memory function some some concern about clinical effects of tumor progression.  His meningioma seems to be progressive in size over past year in my estimation although this is not noted on the report.  It is asymptomatic.  I would like to get a repeat scan in 6 months.  Follow-up after testing complete  - MRI 12/11/19: IMP:   Stable posterior parafalcine meningioma with continued segmental  occlusion of the superior sagittal sinus.  - 09/02/19: eval w Dr. Nilda Calamity (neurology): He is due for MRI for meningioma which does border on posterior cingulate cortex is important in memory function.  Order for MRI provided if this normal I would rec repeat study in a year.  If abnormal would have him schedule for follow-up to discuss further  - 09/03/18: MRI: IMP: Modest increase in residual left parafalcine tumor volume noted when compared back to 2018 but wo significant change when compared to 2019.  - 09/06/17: electronic eval w [name] ([facility]): Dr. Rosanne Ashing residual occipital parasagittal meningioma appears stable between 2017 - 2019 though it enlarged a little between 2008 - 2017. It is now calcifying, suggesting that further growth unlikely. I do not think it is responsible for ongoing symptoms. I would suggest follow up MRI in 2 years.         . Chronic back pain 11/09/2015     Priority: Medium     - eval w [facility] 12/02/19: Managing with Belbuca which he reports improving pain significantly.  Since initiating Belbuca pain is a 3-4.  He has been able to [place] all other meds including tramadol and gabapentin.  - Eval with [facility] 07/12/17: Rec tramadol for severe breakthrough pain.  MRI of cervical spine ordered.  Continue at-home exercises and stretches and physical therapy.  Follow-up in 4 weeks for further evaluation  - As of May 2018 pt reports he gets pain medication and klonazepam from [facility]  - 07/01/16: note from Dr. Toni Amend:  I will no longer refill percocet, clonazepam, or provigil as no disease indications.  I advised he will need  pain specialist provide. Pt has shown no e/o abuse, but has been on meds for years w no benefit. Pt currently being tx for panhypopit and AI, hypogonadism, and hypot GH deficiency. The pt knows to wean clonazepam and asked for 30 days of tramadol to wean from percocet. Note he is only on a half tablet of percocet qid. No refills  will be provided. Pt aware.   - 2005: developed R hip and leg pain and was told he had "small central disk herniation" at L5-S1. Surgery not rec'd. He received PT and some epidural corticosteroid injections which improved but did not completely resolve situation. Low back and leg pain recurred in 2006 and was again tx w steroid and PT.      . Essential hypertension 01/24/2013     Priority: Medium     Treated with amlodipine and ramipril and toprol     . Mixed hyperlipidemia 01/24/2013     Priority: Medium     Pt reports he has taken statin for many years (was taken off to see if this would help with fibromyalgia pain / wo improvement) and zetia and tricor. As of June 2019 not on tricor (concern with zetia and tricor contributing to risk of myopathy)    TC 177 TRI 237 HDL 54 LDL 76 02/14/18  TC 161 TRI 112 HDL 81 LDL 86 09/14/16          . Primary hypothyroidism 01/24/2013     Priority: Medium     Check FT4 in addition to TSH for him    - FT4 wnl as of 11/28/19  - FT4 7.4 wnl as of 09/06/18  - 03/12/18: email from endocrin Dr. Madie Reno at [facility]: " regarding thyroid replacement dose. TSH not reliable. All you really need in addition is FT4 which he states normal.. You values here in past have been around 1.5. If it is still in this range I would not change dose. Re testosterone, it would be good to get free testosterone or bioavailable testosterone along with total to be sure dose is correct. Regarding GH, if you have only been off of it for a week or two, you can start back on same dose. If it has been longer you should probably start at 0.2 mg/day for 2 weeks then increase to 0.3 mg.        . History of asthma 01/24/2013     Priority: Medium     - since childhood    - 11/23/16: eval w Dr. Renita Papa ([facility]m at [facility] [place]): bronchial asthma mild, allergic rhinitis postnasal drainage: uses albuterol BID for chest tightness. Check nitric oxide test. He has had intermittent eosinophilia and this points towards an eosinophilic asthma  phenotype. He does have allergic rhinitis and think he would be better servied with our triple combination nose spray which includes mometasone, diphenhydramine and iptratroprium. I gave him rx for this.      . Basal cell carcinoma (BCC) of skin of nose 01/29/2018     Priority: Low     Dx in June 2019 recommended for MOHS in July 2019     - eval w Dr. Rickard Patience 01/02/18: s/p biopsy L nasal area     . Spondylosis of cervical region without myelopathy or radiculopathy 08/09/2017     Priority: Low     - MRI C Spine w/o contrast: C2-C3: Small posterior disc bulge partially effacing the ventral CSF and gently indenting the ventral cervical  spinal cord.  There is no underlying cord signal abnormality.  There is no central canal nor is there foraminal narrowing.  C3-C4: There is a disc osteophyte complex with a superimposed posterior central disc protrusion which partially effaces the ventral CSF and indents the ventral cervical spinal cord.  There is no central canal stenosis or foraminal narrowing.  C4-C5: There is a disc osteophyte complex with prominent posterior lateral disc component within the foramina bilaterally, right greater than left.  Posterior disc bulge partially effaces the ventral CSF and indents the right ventral cervical spinal cord.  There is mild central canal stenosis.  There is no foraminal narrowing.  C5-C6: There is disc space narrowing and disc desiccation.  There is a disc osteophyte complex with prominent uncovertebral hypertrophy, right greater than left.  Posterior disc bulge partially effaces the ventral CSF.  There is no central canal stenosis.  There is mild to moderate right-sided foraminal encroachment.  The left foramen is patent.  C6-C7: There is disc space narrowing and disc desiccation.  There is a disc osteophyte complex with right posterior lateral disc herniation partially effacing the ventral CSF and mildly flattening.  The underlying cervical spinal cord.  There is marked  right-sided foraminal encroachment as well as mild left-sided foraminal narrowing.  There is facet arthropathy.  C7-T1: There is no evidence of central canal or foraminal stenosis.  Overall impression: Multilevel cervical spondylosis as described in detail above.  - evaluation w [facility] Dr. Penni Bombard Nov 2018     . Amnestic MCI (mild cognitive impairment with memory loss) 11/20/2016     Priority: Low     - had dizziness on donepezil and remeron - [place] both -     - 04/17/19: eval w Neurology Dr. Nilda Calamity: Multiple medical problems and reduced quality of life due to chronic pain insomnia and fatigue and memory concerns.  He would like to try a new possible tx for chronic fatigue and fibromyalgia involving treatment of possible viral pathogens.  I told him I am not comfortable treating him this way with famciclovir and Celebrex but I rec he see another physician interest in fibromyalgia and chronic fatigue as well as possible viral connections with conditions.  Referral made.  I will continue to follow for memory concerns and meningioma.  I have no suspicion for Alzheimer's.  The meningioma does border on posterior cingulate cortex which is important in memory function and will be monitored periodically.  He sees Dr. Theodoro Grist for insomnia and daytime sleepiness.  I will see him again virtually in a few months  - 11/27/18: eval w Neurology Dr. Nilda Calamity: Multiple medical problems and reduced quality of life due to chronic pain insomnia and fatigue with more recent concerns about memory.  At this point based on history and exam as well as review of records I have no reason to suspect Alzheimer's type dementia.  The meningioma is bordering on aspects of the posterior cingulate cortex which is important in memory function so this is potentially a cause.  However I am skeptical of the MCI diagnosis and I would think that abbreviated follow-up testing is important.  He has severe daytime fatigue and sleepiness and  unrefreshing sleep.  He is seeing Dr. Theodoro Grist for further advice on this.  I will prescribe Provigil for his daytime fatigue as I intended to previously.  His meningioma needs continual follow-up.  Although there was no obvious growth with the recent exam I did feel that the meningioma seemed  a bit more robust and greater in volume than it did in 2017 but this may have been due to the greater degree of gadolinium uptake which was probably a technical issue.  His understanding was that the meningioma was calcified and unlikely to grow.       . Hemianopia, homonymous, left 09/15/2015     Priority: Low     - 09/04/17: eval w Dr. Jerre Simon [facility]): He has a L homonymous hemianopia from prior occipital lobe meningioma and resection. I will get baseline visual field since last exam 2 years ago. He reports dry eye. Prior SSA and SSB were negative. Schirmer's test showed normal tear production. The dry eye is mostly from meibomian gland dysfunction and therefore unrelated to Sjogren's. His main visual complaint is blurred vision and tunnel vision during "spells" that he attributes to low cortisol. He is currently being tx with cortisone and has minimal sx. A lack of cortisone would not typically cause visual sx, but if blood pressure dropping, this could be contributing. He is scheduled for autonomic testing later this week. Addendum: 24-2 [place]rey visual field showed a small left inferior homonymous scotoma     . Secondary male hypogonadism 01/24/2013     Priority: Low     - - 10/31/16: eval w Endocrine Dr. Tawanna Cooler [facility]): testosterone level is excellent right now - continue current therapy         . Aseptic necrosis of bone 06/05/2020   . Other Alzheimer's disease 06/05/2020   . Chronic narcotic dependence 06/05/2020   . Meningioma 06/05/2020   . Chronic kidney disease, stage III (moderate) 05/14/2020   . Onychomycosis 02/13/2018   . Xerostomia 12/21/2017     eval w dentist in April 2019 and recommended for tx w evoxac      . Left testicular pain 09/13/2017     09/07/17 evaluation w Urology at [facility]: PLAN: 1 Left Testicular Pain We discussed current symptomatology. We discussed results of ultrasound which does not note concerning pathology to account for left-sided testicular pain. He has noted improvement with use of scrotal support. I did discuss with him consideration of referral to our PMR colleagues, potential PT for pelvic floor assessment or question as to whether nerve block would be appropriate. At this point in time the patient would like to defer anything invasive and is reassured with negative imaging.             . Recurrent sinusitis 01/17/2017     - 11/21/16: eval w ENT Dr. Jannette Spanner [facility]): For recurrent sinusitis, he is on good medical regiment w daily nasal irrigations. Surgery would be indicated for 4-5 episodes of sinusitis per year. He was not interested in operative intervention and this is reasonable. In re to his deviated septum septoplasty could be considered. He was not interested at this time. To address his nasal obstruction on a mucosal basis I recommended triple spray. Avoid afrin. Offered him fluoroscopic eval of his dysphagia but he deferred. Would not recommend surgery for OSA.      [name] BPH (benign prostatic hyperplasia) 01/24/2013     As of 11/13/19 pt noting diminished urinary flow - req trial of flomax 0.4 mg qhs -        The patient's past medical history, surgical history, medications and allergies were reviewed.    Allergies   Allergen Reactions   . Bextra [Valdecoxib] Rash       Medications  Current Outpatient Medications   Medication Sig  Dispense Refill   . albuterol sulfate HFA (ProAir HFA) 108 (90 Base) MCG/ACT inhaler INHALE ONE PUFF BY MOUTH EVERY FOUR TO SIX HOURS AS NEEDED FOR WHEEZING 25.5 g 1   . AMITRIPTYLINE HCL PO Take 40 mg by mouth nightly        . amLODIPine (NORVASC) 5 MG tablet Take 1 tablet (5 mg total) by mouth 2 (two) times daily 180 tablet 3   . Belbuca 150 MCG per buccal film  daily as needed        . celecoxib (CeleBREX) 100 MG capsule Take 1 capsule (100 mg total) by mouth 2 (two) times daily as needed for Pain 180 capsule 0   . clonazePAM (KlonoPIN) 0.5 MG tablet Take 1 tablet (0.5 mg total) by mouth daily as needed for Anxiety 30 tablet 0   . docusate sodium (Colace) 100 MG capsule Take 100 mg by mouth 2 (two) times daily     . ezetimibe (ZETIA) 10 MG tablet Take 1 tablet (10 mg total) by mouth daily 90 tablet 3   . finasteride (PROSCAR) 5 MG tablet Take 1 tablet (5 mg total) by mouth nightly 90 tablet 3   . hydrocortisone (CORTEF) 5 MG tablet Take 15 mg in am, 5 mg in early afternoon and 5 mg at 7 pm. Allow for extra doses in case of stress, illness, fever. (Patient taking differently: Take 20 mg in am, 5 mg in early afternoon. Allow for extra doses in case of stress, illness, fever.  ) 200 tablet 2   . levothyroxine (SYNTHROID) 150 MCG tablet TAKE ONE TABLET BY MOUTH ONE TIME DAILY AT 6 PM 90 tablet 0   . melatonin 3 mg tablet Take 3 mg by mouth nightly        . metoprolol succinate XL (Toprol XL) 25 MG 24 hr tablet Take 1 tablet (25 mg total) by mouth nightly 90 tablet 3   . modafinil (PROVIGIL) 100 MG tablet Take 50 mg by mouth daily PRN       . montelukast (SINGULAIR) 10 MG tablet TAKE 1 TABLET BY MOUTH EVERY NIGHT AT BEDTIME AS DIRECTED 90 tablet 0   . Motegrity 2 MG Tab Take 1 tablet by mouth daily     . Probiotic Product (ALIGN PO) Take 1 tablet by mouth daily     . promethazine (PHENERGAN) 25 MG suppository every 6 (six) hours as needed        . psyllium (Metamucil) 0.52 g capsule Take 1.6 g by mouth 2 (two) times daily     . ramipril (ALTACE) 10 MG capsule Take 1 capsule (10 mg total) by mouth daily 90 capsule 3   . scopolamine (TRANSDERM-SCOP) Place 1 patch onto the skin every third day     . senna (SENOKOT) 8.6 MG tablet Take 1 tablet by mouth 2 (two) times daily        . simvastatin (ZOCOR) 20 MG tablet Take 1 tablet (20 mg total) by mouth nightly 90 tablet 1   .  SYMBICORT 80-4.5 MCG/ACT inhaler Inhale 2 puffs into the lungs two times daily (Patient taking differently: 2 puffs 2 (two) times daily Have patient rinse mouth after use  ) 10.2 g 3   . tamsulosin (FLOMAX) 0.4 MG Cap Take 1 capsule (0.4 mg total) by mouth Daily after dinner 90 capsule 3   . Testosterone 20.25 MG/ACT (1.62%) Gel Apply 4 actuations daily (2 on each shoulder) 300 g 2   . valACYclovir HCL (VALTREX) 500  MG tablet Take 1 tablet (500 mg total) by mouth daily 90 tablet 0     No current facility-administered medications for this visit.     Review of Systems  CONST: No weight change, no fevers/chills sweats, worsening fatigue  NECK: No swollen glands, stiffness or pain  CV: No chest pain, palpitations, leg swelling  RESP: No shortness of breath, cough, wheeze, asthma  GI: No new worsening n/v, diarrhea, constipation, abd pain, heartburn, blood in stool  MSK:  No other new worsening joint or muscle pain, swelling or weakness  NEURO:  No new worsening headache, lightheadedness, dizziness, loss of consciousness, numbness/tingling, weakness    Physical Exam  BP 164/84 (BP Site: Left arm, Patient Position: Sitting, Cuff Size: Medium)   Pulse 84   Temp 98.2 F (36.8 C) (Temporal)   Resp 15   SpO2 99%   Wt Readings from Last 3 Encounters:   06/02/20 78.5 kg (173 lb)   05/14/20 77.1 kg (170 lb)   05/11/20 77.8 kg (171 lb 8.3 oz)     GEN: Well developed well nourished male alert and appropriate in no apparent distress  EYES: PERRL,  extra ocular movements intact, no pallor or scleral icterus, normal conjunctiva  NECK: supple, no thyromegaly or nodules appreciated  LYMPH: no cervical or supraclavicular lymphadenopathy appreciated  CV: regular rate and rhythm no m/r/g.  No lower extremity edema.  2+ radial pulses present and equal.  RESP: clear to auscultation bilaterally, normal effort, no rhonchi or rales  MSK: Normal bulk and tone, normal strength 5/5 and sensation UE / LE  RIGHT SHOULDER: No gross abnormality  noted on inspection.  No stepoff or deformity noted. TTP noted with resisted trapezius motion (pulling). Normal muscle bulk and tone. Pt able to do "touchdown sign." Painful arc test negative. Able to adduct smoothly. Normal internal and external rotation.   NEURO: PERRLA, extra ocular movements intact, face symmetric, hearing intact to voice, palate raise, shoulder shrug and neck flexion intact, tongue midline. Moves all extremities.   PSYCH: normal mood and affect    Assessment/Plan    1. Strain of right trapezius muscle, initial encounter  Based on his clinical presentation and MRI findings a strain of the trapezius is suggested.  I recommended a trial of physical therapy initially rather than doing an MRI of the chest wall.  He is amenable to this.   - Ambulatory referral to Physical Therapy    2. Fibromyalgia / Chronic Pain  We discussed his polypharmacy and medications at length today and I am very pleased he is feeling better on lower doses of Belbuca and my goal for him would be to continue to reduce this if possible as reducing his narcotic dose may be helping with his energy mood function nausea and gut function.  He should follow the guidance of this pain specialist to try to reduce this further. hopeful that with continuing reduction in this we could also consider reductions in other medications to include possibly his Provigil, Motegrity, Phenergan, scopolamine.     His pain management physician had given him Celebrex 100 mg twice daily to help with pain and he notes significant improvement in this.  He asks me to prescribe this for him.  I had an extended discussion with him about benefits risks of this medication to include cardiovascular, GI effects, concern for chronic kidney disease and he is aware of these potential risks.  He feels the benefit of this medication in terms of improving his lifestyle  and function currently outweighs the risk.. I recommended he try to take this in lowest dose for the  shortest amount of time.  He is aware that there is no clear data to indicate anti-inflammatories are helpful in treatment of fibromyalgia however he has felt significant benefit from this and wishes to continue.  I told him we will need to monitor his renal function every 3 months, he will need to monitor his blood pressure on his own and I cautioned him about potential GI side effects and risks associated with this medication.  He wishes to proceed.    Patient is also interested in a trial of Valtrex to help with potential treatment of fibromyalgia.  I told him that there are no clear data or guideline or recommendations for use of this in patients with his medical history (he has done some research on his own suggesting it could be of benefit).  He is interested in doing a short trial of this medication at suppressive dose to see if this also helps with his symptoms. I reviewed risks benefits and side effects of this medication with him and he would like to proceed.  Recommend trial for 90 days.     [email or web address]    - valACYclovir HCL (VALTREX) 500 MG tablet; Take 1 tablet (500 mg total) by mouth daily  Dispense: 90 tablet; Refill: 0  - celecoxib (CeleBREX) 100 MG capsule; Take 1 capsule (100 mg total) by mouth 2 (two) times daily as needed for Pain  Dispense: 180 capsule; Refill: 0    Orders Placed This Encounter   Procedures   . Ambulatory referral to Physical Therapy     Risks & benefits of the new medication(s) were explained to the patient, who appeared to understand and agrees to the treatment plan.    Return in about 3 months (around 09/05/2020).    Gloriajean Dell, MD  [facility]  [address]  [place], [place] [address]  P) [phone]  F) [phone]  [email or web address]

## 2020-07-03 ENCOUNTER — Telehealth (HOSPITAL_BASED_OUTPATIENT_CLINIC_OR_DEPARTMENT_OTHER): Payer: Self-pay

## 2020-07-06 ENCOUNTER — Encounter (HOSPITAL_BASED_OUTPATIENT_CLINIC_OR_DEPARTMENT_OTHER): Payer: Self-pay

## 2020-07-06 ENCOUNTER — Encounter (HOSPITAL_BASED_OUTPATIENT_CLINIC_OR_DEPARTMENT_OTHER): Payer: Self-pay | Admitting: Endocrinology, Diabetes and Metabolism

## 2020-07-06 ENCOUNTER — Ambulatory Visit (INDEPENDENT_AMBULATORY_CARE_PROVIDER_SITE_OTHER): Payer: Medicare Other | Admitting: Endocrinology, Diabetes and Metabolism

## 2020-07-06 VITALS — BP 151/72 | HR 88 | Temp 98.2°F | Ht 70.0 in | Wt 181.0 lb

## 2020-07-06 DIAGNOSIS — E230 Hypopituitarism: Secondary | ICD-10-CM

## 2020-07-06 MED ORDER — LIOTHYRONINE SODIUM 5 MCG PO TABS
5.0000 ug | ORAL_TABLET | Freq: Every day | ORAL | 2 refills | Status: DC
Start: 2020-07-06 — End: 2020-08-10

## 2020-07-06 MED ORDER — HYDROCORTISONE 5 MG PO TABS
ORAL_TABLET | ORAL | 2 refills | Status: DC
Start: 2020-07-06 — End: 2021-07-05

## 2020-07-06 MED ORDER — LEVOTHYROXINE SODIUM 125 MCG PO CAPS
125.0000 ug | ORAL_CAPSULE | Freq: Every morning | ORAL | 2 refills | Status: DC
Start: 2020-07-06 — End: 2020-07-08

## 2020-07-07 ENCOUNTER — Encounter (HOSPITAL_BASED_OUTPATIENT_CLINIC_OR_DEPARTMENT_OTHER): Payer: Self-pay | Admitting: Endocrinology, Diabetes and Metabolism

## 2020-07-08 ENCOUNTER — Inpatient Hospital Stay: Payer: Medicare Other | Admitting: Rehabilitative and Restorative Service Providers"

## 2020-07-08 ENCOUNTER — Encounter (HOSPITAL_BASED_OUTPATIENT_CLINIC_OR_DEPARTMENT_OTHER): Payer: Self-pay | Admitting: Endocrinology, Diabetes and Metabolism

## 2020-07-08 ENCOUNTER — Other Ambulatory Visit (HOSPITAL_BASED_OUTPATIENT_CLINIC_OR_DEPARTMENT_OTHER): Payer: Self-pay | Admitting: Endocrinology, Diabetes and Metabolism

## 2020-07-08 MED ORDER — LEVOTHYROXINE SODIUM 125 MCG PO TABS
125.0000 ug | ORAL_TABLET | Freq: Every morning | ORAL | 2 refills | Status: DC
Start: 2020-07-08 — End: 2020-09-03

## 2020-07-09 ENCOUNTER — Encounter: Payer: Self-pay | Admitting: Rehabilitative and Restorative Service Providers"

## 2020-07-09 ENCOUNTER — Inpatient Hospital Stay: Payer: Medicare Other | Attending: Internal Medicine | Admitting: Rehabilitative and Restorative Service Providers"

## 2020-07-09 VITALS — BP 164/91 | HR 81

## 2020-07-09 DIAGNOSIS — T148XXA Other injury of unspecified body region, initial encounter: Secondary | ICD-10-CM | POA: Insufficient documentation

## 2020-07-09 DIAGNOSIS — S46811A Strain of other muscles, fascia and tendons at shoulder and upper arm level, right arm, initial encounter: Secondary | ICD-10-CM | POA: Insufficient documentation

## 2020-07-09 DIAGNOSIS — M25511 Pain in right shoulder: Secondary | ICD-10-CM | POA: Insufficient documentation

## 2020-07-09 DIAGNOSIS — M549 Dorsalgia, unspecified: Secondary | ICD-10-CM | POA: Insufficient documentation

## 2020-07-09 NOTE — Progress Notes (Signed)
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   Date of Service: 07/09/2020  Referring Physician: Modesto Charon, MD   Date of Injury: 06/05/2020  Date Care Plan Established/Reviewed: 07/09/2020  Date Treatment Started: 07/09/2020  End of Certification Date: 10/06/2020  Sessions in Plan of Care: 18  Surgery Date: No data was found      Visit Count: 1   Diagnosis:   1. Acute pain of right shoulder    2. Mid back pain on right side    3. Muscle strain        Subjective     History of Present Illness   History of Present Illness: Patient presents to therapy due to acute flare up of chronic R posterior scapulothoracic pain. The patient reports pain is more of an ache and fluctuates based on activity and stress. Denies any recent trauma but reports that when he was in high school he initially injured same muscle while trying to lift 250 lbs. 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 Has had trigger point injections in the past (most recent 2 months ago; several ~2007), however patient reports only temporary relief. In the past week, the patient reports the pain hasn't been bothering him that much because he hasn't done much to provoke it.    Had MRI and x-Ammon of thoracic spine a couple weeks ago (results below). Patient reports PCP thought pain was due to trapezius strain, however he feels like it is torn.    Occasionally uses Ibuprofen and lidocaine patches for pain relief. Used to lay on tennis ball to help alleviate trigger points but discontinued due to hard time finding the tender spots.    Also mentions history of fibromyalgia, LBP, cancer and chronic fatigue. Initially received treatment for brain tumor, however cancer has returned and he is not participating in any active treatment.    Imaging results from August:  "IMPRESSION:   1. Minor diffuse degenerative changes in the  thoracic spine.  2. Findings suspicious for a strain of the lateral aspect of the right  trapezius muscle. This is incompletely evaluated on this thoracic spine  MRI. If symptoms persist, evaluation with a chest wall MRI is recommended."    "IMPRESSION:   No evidence of fracture or other abnormality of the right ribs."  Functional Limitations (PLOF): Increased pain with repetitive pulling motion (worse when he was operating, unable to use elliptical) (less pain)  Difficulty picking up heavy object (less pain/difficulty)  Difficulty pushing away from body (less pain/difficulty)  Difficulty washing back due to pain (less pain/difficulty)  Notes that pain is worse with stress (less frequent/severe pain)    Pain   Current pain rating: 0  At best pain rating: 0  At worst pain rating: 2  Location: R medial scapular border/posterior shoulder  *Wrong FOTO completed at IE- complete next session    Social Support/Occupation  Lives in: multiple level home  Lives with: spouse  Occupation: retired      Precautions: No data was found  Allergies: Bextra [valdecoxib]    Past Medical History:   Diagnosis Date   . Aortic regurgitation    . Ascending aortic aneurysm 09/19/2018    4 cm ECHO   . Asthma    . Avascular necrosis 2008    R hip - did not require surgery   . Bilateral cataracts  budding   . Brain tumor 2003    Taken out in 2003, has returned    . Calculus of kidney    . Cold intolerance    . Color blindness    . Disorder of prostate     BPH   . DJD (degenerative joint disease)     Lumbar / Sacral   . Dry eyes     uses restasis eyedrops which helps (RF, CCP, CRP and ESR have been wnl in past)   . Dysautonomia    . Eczema 03/29/2016    Right thumb    . Encounter for blood transfusion 2004   . Encounter for hepatitis C screening test for low risk patient 08/2016    negative   . Encounter for pharmacogenetic testing 10/2015    [facility]   . Fibromyalgia    . Gastroesophageal reflux disease    . Hemorrhoids    . Hyperlipidemia     . Hypertension    . Hypothyroidism    . Lateral epicondylitis     Left   . Malignant neoplasm of skin    . Mitral valve insufficiency    . Osteoporosis    . Peripheral neuropathy 12/26/2019   . Screening PSA (prostate specific antigen) 02/14/2018    0.4   . Sleep apnea     mild, no cpap   . Tinnitus     high pitched buzzing comes and goes - started after craniotomy for meningioma       OBJECTIVE:    Vitals: BP: (!) 164/91 (patient reports white coat syndrome & measures at home) Heart Rate: 81      Observation/Posture/Gait/Integumentary  Posture: Deficits noted: Forward Head, Rounded Shoulders and increased Thoracic Kyphosis  Gait: Within functional limits  Integumentary: No wound, lesion or rash noted    AROM: Cervical Spine   Initial      Flexion WFL      Extension WFL       R L R L   Rotation WFL      Side Bending 25 20*     (blank fields were intentionally left blank)  *compensates with R shoulder elevation    InitialRight  AROM   Right  AROM Shoulder  WFL and Other: see below InitialLeft AROM   Left AROM   125  Flexion 140      Extension     WFL  Abduction WFL    FR T9  Internal Rotation FR T11    FR T2  External Rotation FR T3    (blank fields were intentionally left blank)    IE:  Thoracic flexion- 20  Thoracic extension- very limited  Thoracic rotation-  L: 55 tight on R  R: 70    Repeated flexion/extension centralizes symptoms? N/A    STRENGTH  Cervical  MMT /5 IE R IE L R L   C1/2 Neck Flex NT      Neck Ext       C3 Neck Sidebend       Neck Rotation       (blank fields were intentionally left blank)    Initial R   R UE Strength  MMT /5 Initial  L   L   5  Shoulder Flexion 5    4+  Shoulder Abduction (C5) 5-    5  Shoulder Extension 5    4 strain  Shoulder External Rotation 4    4+ strain  Shoulder Internal Rotation 5  Rhomboids       Serratus       Upper Trapezius (C4)     3+  Middle Trapezius (C4) 3+    3  Lower Trapezius (C4) 3    5   Biceps (C6) 5    5  Triceps (C7) 5      Wrist Extension (C7)       Wrist Flexion (C7)     (blank fields were intentionally left blank)     Initial   Right Right Dynamometer Strength Initial   Left Left   NT  Grip NT      Palmar Pinch  (thumb pad to   finger pad)       Lateral Pinch (thumb pad to lateral surface of index finger)       Tip Pinch (thumb tip to index finger)     (blank fields were intentionally left blank)    Palpation: Muscle tone: hypertonicity noted: R infraspinatus, supraspinatus, UT, thoracic paraspinals- notable difference R compared to L    Joint Mobility Assessment: hypomobile thoracic spine (most notably T2-7) End feel: Firm    Sensation to Light touch: Intact    Special Tests/Neurological Screen:   R L   Deep Tendon Reflexes: Biceps NT NT    Brachioradialis NT NT    Triceps NT NT     *Deferred due to no report of neurological symptoms     R L   Spurling NT NT   Distraction Test NT NT   Upper Limb Tension Test NT NT   Thoracic Outlet NT NT   Deep Neck Flexor Endurance NT NT   Quadrant Test (-) (-)       Muscle extensibility:  UT: Limited R>L           BP: (!) 164/91 (patient reports white coat syndrome & measures at home) Heart Rate: 81    Treatment     Therapeutic Exercises - Justified to address any of the following: develop strength, endurance, ROM and/or flexibility.   Exercises  Baby Eagle - 3 x daily - 7 x weekly - 1 sets - 3 reps - 30 hold  Seated Cervical Sidebending Stretch - 3 x daily - 7 x weekly - 1 sets - 3 reps - 30 hold  Sidelying Thoracic Rotation with Open Book - 3 x daily - 7 x weekly - 1 sets - 10 reps - 5 hold      *Patient instructed on proper completion of HEP, expected results, POC, and purpose of selected exercises to promote optimal outcomes.  *Open Books performed with cues to bring inferior angle of scapula towards table to avoid excessive strain on shoulders.    Manual Therapy - Justified to address any of the following:  Mobilization of joints and  soft tissues, manipulation, manual lymphatic drainage, and/or manual traction.    Prone: STM to R medial scapular musculature; Gr I-III thoracic PA joint mobilizations    *Patient responding well to STM and joint mobs- instructed to try use of tennis ball on wall at home.      Home Exercises   Access Code: [email or web address]  URL: [email or web address]  Date: 07/09/2020  Prepared by: Mauri Brooklyn         ---      ---   Total Time    Timed Minutes 20 minutes   Untimed Minutes 25 minutes   Total Time 45 minutes        Assessment   Kaesyn is a  69 y.o. male presenting with R medial scapular border/posterior shoulder pain who requires Physical Therapy for the following:  Impairments: Decreased UE ROM, decreased UE strength, decreased muscle extensibility, postural deficits, and increased pain.      Pain located: R scapulothoracic and posterior shoulder musculature    Clinical presentation: evolving  Barriers to therapy: Patient's age [age] yo - may affect healing  Time since onset of injury/illness/exacerbation - acute on chronic nature of pain  Comorbidities - history of cancer (that has returned), cardiac history, asthma, HTN, fibromyalgia, chronic fatigue - may affect healing  Functional Limitations (PLOF): Increased pain with repetitive pulling motion (worse when he was operating, unable to use elliptical) (less pain)  Difficulty picking up heavy object (less pain/difficulty)  Difficulty pushing away from body (less pain/difficulty)  Difficulty washing back due to pain (less pain/difficulty)  Notes that pain is worse with stress (less frequent/severe pain)  Prognosis: good  Plan   Visits per week: 2  Number of Sessions: 18  Direct One on One  16109: Therapeutic Exercise: To Develop Strength and Endurance, ROM and Flexibility  [id number]: Neuromuscular Reeducation  97140: Manual Therapy techniques (mobilization, manipulation, manual traction) (STM to scapulothoracic/shoulder/cervical musculature; Gr I-IV scapular and  thoracic mobs)  97530: Therapeutic Activities: Dynamic activities to improve functional performance  Dry Needling  Supervised Modalities  97010: Thermal modalities: hot/cold packs  60454: Electrical stimulation  Initiate POC to improve muscle extensibility, ROM, strength, and overall function. Next time: thoracic FOTO      Goals    Goal 1: Increase mid-trap/lower trap strength by 1/2 to 1 grade to allow patient to pull open door with improved posture and less pain.   Sessions: 18      Goal 2: Patient will demonstrate improve shoulder ER/IR strength by 1/2 to 1 grade to allow the patient to wash/dress without increased strain or difficulty.   Sessions: 18      Goal 3: Patient will demonstrate independence in prescribed HEP with proper form, sets and reps for safe discharge to an independent program.   Sessions: 18      Goal 4: Increase shoulder flexion AROM to 140 degrees to allow pt to lift/lower objects with R UE without increase in pain.   Sessions: 29                                Roetta Sessions, DPT

## 2020-07-10 ENCOUNTER — Encounter: Payer: Self-pay | Admitting: Internal Medicine

## 2020-07-13 ENCOUNTER — Inpatient Hospital Stay: Payer: Medicare Other | Admitting: Rehabilitative and Restorative Service Providers"

## 2020-07-13 DIAGNOSIS — M25511 Pain in right shoulder: Secondary | ICD-10-CM

## 2020-07-13 DIAGNOSIS — M549 Dorsalgia, unspecified: Secondary | ICD-10-CM

## 2020-07-13 DIAGNOSIS — T148XXA Other injury of unspecified body region, initial encounter: Secondary | ICD-10-CM

## 2020-07-13 NOTE — Progress Notes (Signed)
[facility]: [place] denial- Levothyroxine125 mcg capsules. Tablet covered. Not capsules. 07/06/2020.

## 2020-07-13 NOTE — PT/OT Therapy Note (Signed)
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   Date of Service: 07/13/2020  Referring Physician: Modesto Charon, MD   Date of Injury: 06/05/2020  Date Care Plan Established/Reviewed: 07/09/2020  Date Treatment Started: 07/09/2020  End of Certification Date: 10/06/2020  Sessions in Plan of Care: 18  Surgery Date: No data was found    Visit Count: 2   Diagnosis:   1. Acute pain of right shoulder    2. Mid back pain on right side    3. Muscle strain        Subjective     Social Support/Occupation  Lives in: multiple level home  Lives with: spouse  Occupation: retired    Patient reports he's been doing exercises at home. "Feels" when he's doing stretches but not a pain.      Precautions: No data was found  Allergies: Bextra [valdecoxib]                Treatment     Therapeutic Exercises - Justified to address any of the following: develop strength, endurance, ROM and/or flexibility.   - Cross Body Rhomboid Stretch, 30 sec x 3 R only  - Seated Cervical Sidebending Stretch, 30 sec x 3 R only (cues for gentle stretch and to only perform OP if comfortable)  - Introduced LS Stretch, 30 sec x 3 R only (added to HEP)  - Introduced Thread the Needle on Wall, x 10 reps with 5 sec hold B  - Introduced Chiropodist with Arm at 60, 30 sec x 3 R only (cues for gentle stretch; added to HEP)    *Therapist providing verbal/tactile cues to improve form and gentle stretch with exercises above.    Deferred today:  - Sidelying Thoracic Rotation with Open Book, x 10 reps with 5 sec hold    Neuromuscular Re-Education - Justified to address any of the following: of movement, balance, coordination, kinesthetic sense, posture and/or proprioception for sitting and/or standing activities.   - Instructed in Side Lying Scap Retraction, x 10 reps with 5 sec hold  - Progressed to Side Lying Scapular PNF for downward depression with therapist providing manual resistance for isometric hold, slow reversal and COI    Manual Therapy - Justified  to address any of the following:  Mobilization of joints and soft tissues, manipulation, manual lymphatic drainage, and/or manual traction.    L Side Lying: STM to R medial scapular border, rhomboids, UT/LS, infraspinatus, teres major/minor with/without use of theragun    Prone: STM to R medial scapular musculature with hand on back for subscap release; Gr I-III thoracic PA joint mobilizations    Home Exercises   Access Code: [email or web address]  URL: [email or web address]  Date: 07/09/2020  Prepared by: Mauri Brooklyn         ---      ---   Total Time    Timed Minutes 43 minutes   Total Time 43 minutes        Assessment   Patient tolerated today's session well with positive response to STM and use of theragun to help alleviate muscle tightness. Less restriction noted with active neck/shoulder motion following MT. Therapist providing mod cuing for gentle stretch and to ease in/out to avoid overstretching.  Plan   Progress POC to improve muscle extensibility, pain and overall function.      Goals    Goal 1: Increase mid-trap/lower trap strength by 1/2 to 1 grade to allow patient to pull open door with improved posture  and less pain.   Sessions: 18      Goal 2: Patient will demonstrate improve shoulder ER/IR strength by 1/2 to 1 grade to allow the patient to wash/dress without increased strain or difficulty.   Sessions: 18      Goal 3: Patient will demonstrate independence in prescribed HEP with proper form, sets and reps for safe discharge to an independent program.   Sessions: 18      Goal 4: Increase shoulder flexion AROM to 140 degrees to allow pt to lift/lower objects with R UE without increase in pain.   Sessions: 21                                Roetta Sessions, DPT

## 2020-07-13 NOTE — Progress Notes (Signed)
[facility]- Levothyroxine Sodium 125 mcg capsule. Tablet covered by insurance. Authorization to change prescription from capsule to tablet was faxed back to [facility] Fx# [phone]. 3:39 pm.

## 2020-07-27 ENCOUNTER — Inpatient Hospital Stay: Payer: Medicare Other | Attending: Internal Medicine | Admitting: Rehabilitative and Restorative Service Providers"

## 2020-07-27 ENCOUNTER — Other Ambulatory Visit (INDEPENDENT_AMBULATORY_CARE_PROVIDER_SITE_OTHER): Payer: Self-pay | Admitting: Adult Health

## 2020-07-27 DIAGNOSIS — I10 Essential (primary) hypertension: Secondary | ICD-10-CM

## 2020-07-27 DIAGNOSIS — M25511 Pain in right shoulder: Secondary | ICD-10-CM | POA: Insufficient documentation

## 2020-07-27 DIAGNOSIS — M549 Dorsalgia, unspecified: Secondary | ICD-10-CM | POA: Insufficient documentation

## 2020-07-27 DIAGNOSIS — T148XXA Other injury of unspecified body region, initial encounter: Secondary | ICD-10-CM | POA: Insufficient documentation

## 2020-07-27 NOTE — PT/OT Therapy Note (Signed)
Name: Jamison Neighbor, MD Age: 69 y.o.   Date of Service: 07/27/2020  Referring Physician: Modesto Charon, MD   Date of Injury: 06/05/2020  Date Care Plan Established/Reviewed: 07/09/2020  Date Treatment Started: 07/09/2020  End of Certification Date: 10/06/2020  Sessions in Plan of Care: 18  Surgery Date: NA  Visit Count: 3   Diagnosis:   1. Acute pain of right shoulder    2. Mid back pain on right side    3. Muscle strain      Subjective     Pain   Started using tennis ball to massage tender right mid back since last treatment 2 weeks ago.Denies pain in area since last treatment.Able to ride [employer] bike at home for 30 minutes without upper arm discomfort.Ordered massage gun and should arrive in the next 2 weeks.History of insomnia managed with sleep aides for last 2 weeks.    Social Support/Occupation  Lives in: multiple level home  Lives with: spouse  Occupation: retired    Precautions: No data was found  Allergies: Bextra [valdecoxib]    AROM Cervical Side Bending: R from 25 to 38;L from 20 to 35              Treatment     Therapeutic Exercises - Justified to address any of the following: develop strength, endurance, ROM and/or flexibility.   - Cross Body Rhomboid Stretch, 30 sec x 3 R only  - Seated Cervical Sidebending Stretch, 30 sec x 3 R only (cues for gentle stretch and to only perform OP if comfortable)  - Introduced LS Stretch, 30 sec x 3 R only   - Sidelying Thoracic Rotation with Open Book, x 10 reps with 5 sec hold  Not today  - Reviewed Thread the Needle on Wall, x 10 reps with 5 sec hold B  - Reviewed Standing Pec Stretch with Arm at 60, 30 sec x 3 R only (cues for gentle stretch; added to HEP)    *Therapist providing verbal/tactile cues to improve form and gentle stretch with exercises above.        Neuromuscular Re-Education - Justified to address any of the following: of movement, balance, coordination, kinesthetic sense, posture and/or proprioception for sitting and/or standing  activities.   - Instructed in Side Lying Scap Retraction, x 10 reps with 5 sec hold  - Progressed to Side Lying Scapular PNF for downward depression with therapist providing manual resistance for isometric hold, slow reversal and COI    Manual Therapy - Justified to address any of the following:  Mobilization of joints and soft tissues, manipulation, manual lymphatic drainage, and/or manual traction.    L Side Lying: STM to R medial scapular border, rhomboids, UT/LS, infraspinatus, teres major/minor with/without use of theragun    Prone: STM to R medial scapular musculature with hand on back for subscap release; Gr I-III thoracic PA joint mobilizations    Home Exercises   Access Code: [email or web address]  URL: [email or web address]  Date: 07/09/2020  Prepared by: Mauri Brooklyn         ---      ---   Total Time    Timed Minutes 43 minutes   Total Time 43 minutes        Assessment   Palpable muscle tension right T 2 to T 7 paraspinals,subscapularis,lower trapezius,and levator scapula.Tolerated Theragun and PT STM in left side lying position without pain.Tight proximal latissimus dorsi and triceps noted with STM and MT today.  Plan   Progress  POC to improve muscle extensibility, pain and overall function.Check right dominant shoulder standing shoulder mobility.      Goals    Goal 1: Increase mid-trap/lower trap strength by 1/2 to 1 grade to allow patient to pull open door with improved posture and less pain.   Sessions: 18      Goal 2: Patient will demonstrate improve shoulder ER/IR strength by 1/2 to 1 grade to allow the patient to wash/dress without increased strain or difficulty.   Sessions: 18      Goal 3: Patient will demonstrate independence in prescribed HEP with proper form, sets and reps for safe discharge to an independent program.   Sessions: 18      Goal 4: Increase shoulder flexion AROM to 140 degrees to allow pt to lift/lower objects with R UE without increase in pain.   Sessions: 18                                 Barnet Glasgow , PT

## 2020-07-28 ENCOUNTER — Encounter: Payer: Self-pay | Admitting: Internal Medicine

## 2020-07-30 ENCOUNTER — Other Ambulatory Visit: Payer: Self-pay

## 2020-07-30 DIAGNOSIS — R350 Frequency of micturition: Secondary | ICD-10-CM

## 2020-07-30 DIAGNOSIS — N401 Enlarged prostate with lower urinary tract symptoms: Secondary | ICD-10-CM

## 2020-07-30 DIAGNOSIS — J4540 Moderate persistent asthma, uncomplicated: Secondary | ICD-10-CM

## 2020-07-30 MED ORDER — FINASTERIDE 5 MG PO TABS
5.0000 mg | ORAL_TABLET | Freq: Every evening | ORAL | 3 refills | Status: DC
Start: 2020-07-30 — End: 2021-02-14

## 2020-07-30 MED ORDER — MONTELUKAST SODIUM 10 MG PO TABS
ORAL_TABLET | ORAL | 3 refills | Status: DC
Start: 2020-07-30 — End: 2021-07-19

## 2020-07-30 NOTE — Telephone Encounter (Signed)
Called to let Dr. [name] know prescription for Singulair was sent in to [facility] pharmacy- he appreciated the information and said he actually also needs his Proscar refilled as well. Will send another refill request to MD.     Jamison Neighbor, MD said he did not need a call back [facility].

## 2020-08-05 ENCOUNTER — Inpatient Hospital Stay: Payer: Medicare Other | Admitting: Rehabilitative and Restorative Service Providers"

## 2020-08-05 ENCOUNTER — Encounter: Payer: Self-pay | Admitting: Internal Medicine

## 2020-08-05 ENCOUNTER — Other Ambulatory Visit: Payer: Self-pay

## 2020-08-05 DIAGNOSIS — T148XXA Other injury of unspecified body region, initial encounter: Secondary | ICD-10-CM

## 2020-08-05 DIAGNOSIS — M25511 Pain in right shoulder: Secondary | ICD-10-CM

## 2020-08-05 DIAGNOSIS — M549 Dorsalgia, unspecified: Secondary | ICD-10-CM

## 2020-08-05 MED ORDER — SIMVASTATIN 20 MG PO TABS
20.0000 mg | ORAL_TABLET | Freq: Every evening | ORAL | 0 refills | Status: DC
Start: 2020-08-05 — End: 2020-11-03

## 2020-08-05 NOTE — PT/OT Therapy Note (Signed)
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   Date of Service: 08/05/2020  Referring Physician: Modesto Charon, MD   Date of Injury: 06/05/2020  Date Care Plan Established/Reviewed: 07/09/2020  Date Treatment Started: 07/09/2020  End of Certification Date: 10/06/2020  Sessions in Plan of Care: 18  Surgery Date: NA  Visit Count: 4   Diagnosis:   1. Acute pain of right shoulder    2. Mid back pain on right side    3. Muscle strain      Subjective     Pain   Using tennis ball to massage tender right mid back- finding it very helpful.Able to ride Schwinn bike at home for 30 minutes without upper arm discomfort. Have a massager and is using it-- feels good. History of insomnia and fibromyalgia.    Social Support/Occupation  Lives in: multiple level home  Lives with: spouse  Occupation: retired    Precautions: No data was found  Allergies: Bextra [valdecoxib]    AROM Cervical Side Bending: R from 25 to 38;L from 20 to 35                    Treatment     Therapeutic Exercises - Justified to address any of the following: develop strength, endurance, ROM and/or flexibility.     - Sidelying Thoracic Rotation with Open Book, x 10 reps with 5 sec hold  * cuing to emphasize thoracic rotation VC/TC     Sci Fit UE only x 4 min alternating directions    Added:  Standing GTB Rows x 12  *cuing to retract scapula    Standing GTB Ext x 12  Started LT GTB     Standing RTB OH LT but [place] due to R shoulder discomfort after 1st rep      Below deferred today:    - Cross Body Rhomboid Stretch, 30 sec x 3 R only  - Seated Cervical Sidebending Stretch, 30 sec x 3 R only (cues for gentle stretch and to only perform OP if comfortable)  - Introduced LS Stretch, 30 sec x 3 R only     - Reviewed Thread the Needle on Wall, x 10 reps with 5 sec hold B  - Reviewed Standing Pec Stretch with Arm at 60, 30 sec x 3 R only (cues for gentle stretch; added to HEP)    *Therapist providing verbal/tactile cues to improve form and gentle stretch with  exercises above.        Neuromuscular Re-Education - Justified to address any of the following: of movement, balance, coordination, kinesthetic sense, posture and/or proprioception for sitting and/or standing activities.   Not today:  - Instructed in Side Lying Scap Retraction, x 10 reps with 5 sec hold    - Progressed to Side Lying Scapular PNF for downward depression with therapist providing manual resistance for isometric hold, slow reversal and COI    Manual Therapy - Justified to address any of the following:  Mobilization of joints and soft tissues, manipulation, manual lymphatic drainage, and/or manual traction.    Extensive STM today:    Pt supine and STM- ischemic pressure to R rhomboid and infaspinatus     L Side Lying: STM to R medial scapular border, rhomboids, infraspinatus with use of theragun    Deferred today  Prone: STM to R medial scapular musculature with hand on back for subscap release; Gr I-III thoracic PA joint mobilizations    Home Exercises   Access Code:  [email or web address]  URL: [email or web address]  Date: 07/09/2020  Prepared by: Mauri Brooklyn      Modalities   Ice x 10 min to posterior R shoulder w extra pillows used to support ice pack and create better ice contact.       ---      ---   Total Time    Timed Minutes 40 minutes   Total Time 40 minutes        Assessment   Palpable muscle tension right T 2 to T 7 paraspinals,subscapularis,lower trapezius,and levator scapula. Added rows and ext to address weakness with cuing for better form. Pt would benefit from Lat Dorsi TB if tolerated for further scap stability  Plan   Progress POC to improve muscle extensibility, pain and overall function.Check right dominant shoulder standing shoulder mobility.      Goals    Goal 1: Increase mid-trap/lower trap strength by 1/2 to 1 grade to allow patient to pull open door with improved posture and less pain.   Sessions: 18      Goal 2: Patient will demonstrate improve shoulder ER/IR strength by 1/2 to 1  grade to allow the patient to wash/dress without increased strain or difficulty.   Sessions: 18      Goal 3: Patient will demonstrate independence in prescribed HEP with proper form, sets and reps for safe discharge to an independent program.   Sessions: 18      Goal 4: Increase shoulder flexion AROM to 140 degrees to allow pt to lift/lower objects with R UE without increase in pain.   Sessions: 18                                Mariel Kansky, PT

## 2020-08-06 ENCOUNTER — Other Ambulatory Visit: Payer: Self-pay | Admitting: Internal Medicine

## 2020-08-06 DIAGNOSIS — M797 Fibromyalgia: Secondary | ICD-10-CM

## 2020-08-06 MED ORDER — VALACYCLOVIR HCL 500 MG PO TABS
500.0000 mg | ORAL_TABLET | Freq: Every day | ORAL | 3 refills | Status: DC
Start: 2020-08-06 — End: 2020-12-15

## 2020-08-06 NOTE — Progress Notes (Signed)
Hi Dr. Orlin Hilding,   Even though I could not tolerate Celebrex, I think the Valtrex is helping. Could you please refill the Valtrex?   The physical therapy is helping my right shoulder. However, my right hip/lower back is also a chronic problem. Should I schedule an appointment with you to get a referral for physical therapy?   Thanks,   Shannon [name]

## 2020-08-10 ENCOUNTER — Ambulatory Visit (INDEPENDENT_AMBULATORY_CARE_PROVIDER_SITE_OTHER): Payer: Medicare Other | Admitting: Endocrinology, Diabetes and Metabolism

## 2020-08-10 ENCOUNTER — Encounter (HOSPITAL_BASED_OUTPATIENT_CLINIC_OR_DEPARTMENT_OTHER): Payer: Self-pay | Admitting: Endocrinology, Diabetes and Metabolism

## 2020-08-10 ENCOUNTER — Encounter: Payer: Self-pay | Admitting: Internal Medicine

## 2020-08-10 VITALS — BP 139/73 | HR 70 | Temp 98.2°F | Resp 14 | Ht 70.0 in | Wt 181.0 lb

## 2020-08-10 DIAGNOSIS — R7309 Other abnormal glucose: Secondary | ICD-10-CM

## 2020-08-10 DIAGNOSIS — E039 Hypothyroidism, unspecified: Secondary | ICD-10-CM

## 2020-08-10 MED ORDER — LEVOTHYROXINE SODIUM 100 MCG PO TABS
100.0000 ug | ORAL_TABLET | Freq: Every morning | ORAL | 11 refills | Status: DC
Start: 2020-08-10 — End: 2020-10-27

## 2020-08-10 MED ORDER — LIOTHYRONINE SODIUM 5 MCG PO TABS
10.0000 ug | ORAL_TABLET | Freq: Every day | ORAL | 4 refills | Status: DC
Start: 2020-08-10 — End: 2020-10-27

## 2020-08-10 NOTE — Progress Notes (Signed)
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 I saw him few months ago.  Has a H/O of hypopituitarism presumably secondary to brain surgery for meningioma. I reviewed his labs in [facility]. He is accompanied by his spouse. We have him on hydrocortisone, levothyroxine and testosterone replacement therapy (TRT)  He was also seen in the past at the endocrine department at the [facility] clinic.  In the past, he has been on quite high doses of hydrocortisone (HC) at time (300+ mg daily) to treat symptoms of nausea, extreme fatigue, and dizziness. He was able to taper the dose of HC and we continue to work with hon on trying to find the appropriate dose. He has episodes of dizziness and nausea that have been treated symtopmatically with high doses of corticosteroids in the past, with improvement in the nausea (the nausea itself is not related to adrenal insufficiency).  He is also using topical testosterone for hypogonadism.      He also takes levothyroxine for hypothyroidism. We added a small dose of liothyronine (LT3) and that seems to help a little subjectively. We also have been titrating the dose of testosterone (T) replacement as to achieve a T level around 500 ng/dL.  Fatigue, dizziness and dizziness have bene the dominant symptoms.    Past Medical History:   Diagnosis Date   . Aortic regurgitation    . Ascending aortic aneurysm 09/19/2018    4 cm ECHO   . Asthma    . Avascular necrosis 2008    R hip - did not require surgery   . Bilateral cataracts     budding   . Brain tumor 2003    Taken out in 2003, has returned    . Calculus of kidney    . Cold intolerance    . Color blindness    . Disorder of prostate     BPH   . DJD (degenerative joint disease)     Lumbar / Sacral   . Dry eyes     uses restasis eyedrops which helps (RF, CCP, CRP and ESR have been wnl in past)   . Dysautonomia    . Eczema 03/29/2016    Right thumb    . Encounter for blood transfusion  2004   . Encounter for hepatitis C screening test for low risk patient 08/2016    negative   . Encounter for pharmacogenetic testing 10/2015    [facility]   . Fibromyalgia    . Gastroesophageal reflux disease    . Hemorrhoids    . Hyperlipidemia    . Hypertension    . Hypothyroidism    . Lateral epicondylitis     Left   . Malignant neoplasm of skin    . Mitral valve insufficiency    . Osteoporosis    . Peripheral neuropathy 12/26/2019   . Screening PSA (prostate specific antigen) 02/14/2018    0.4   . Sleep apnea     mild, no cpap   . Tinnitus     high pitched buzzing comes and goes - started after craniotomy for meningioma     Past Surgical History:   Procedure Laterality Date   . COLONOSCOPY  2008   . ECHOCARDIOGRAM, TRANSTHORACIC  04/29/2005    63   . ECHOCARDIOGRAM, TRANSTHORACIC  10/17/2016    63   . EGD, COLONOSCOPY N/A 04/16/2019    Procedure: EGD, COLONOSCOPY;  Surgeon: Leatha Gilding, MD;  Location: [facility];  Service: Gastroenterology;  Laterality: N/A;  egd, colonoscopy  q1-unk, md req 45 min   . EXCISION, LESION  12/03/2008    benign lesion   . ORCHIOPEXY Left 1980    for intermittent torsion   . Subtotal resection of posterior sagittal meningioma  12/05/2002    Partial brain resection secondary to meningioma at [facility]   . TONSILLECTOMY AND ADENOIDECTOMY  as a child   . TOOTH EXTRACTION Right 06/2018     Family History   Problem Relation Age of Onset   . Asthma Daughter    . Tuberculosis Mother    . Dementia Mother    . Migraines Sister      Allergies   Allergen Reactions   . Bextra [Valdecoxib] Rash     Social History     Socioeconomic History   . Marital status: Married   Tobacco Use   . Smoking status: Never Smoker   . Smokeless tobacco: Never Used   Vaping Use   . Vaping Use: Never used   Substance and Sexual Activity   . Alcohol use: Yes     Comment: Rarely   . Drug use: Not Currently     Types: Marijuana   . Sexual activity: Not Currently     Partners: Female     Birth control/protection: None    Social History Narrative    He lives in [place] and is from [place]. Married - wife Angelique Blonder. Two daughters (one lives in [place] and one in [place]). Retired Web designer - retired in 2016.     Current Outpatient Medications on File Prior to Visit   Medication Sig Dispense Refill   . albuterol sulfate HFA (ProAir HFA) 108 (90 Base) MCG/ACT inhaler INHALE ONE PUFF BY MOUTH EVERY FOUR TO SIX HOURS AS NEEDED FOR WHEEZING 25.5 g 1   . AMITRIPTYLINE HCL PO Take 40 mg by mouth nightly        . amLODIPine (NORVASC) 5 MG tablet Take 1 tablet (5 mg total) by mouth 2 (two) times daily 180 tablet 3   . Belbuca 150 MCG per buccal film daily as needed        . clonazePAM (KlonoPIN) 0.5 MG tablet Take 1 tablet (0.5 mg total) by mouth daily as needed for Anxiety 30 tablet 0   . docusate sodium (Colace) 100 MG capsule Take 100 mg by mouth 2 (two) times daily     . ezetimibe (ZETIA) 10 MG tablet Take 1 tablet (10 mg total) by mouth daily 90 tablet 3   . finasteride (PROSCAR) 5 MG tablet Take 1 tablet (5 mg total) by mouth nightly 90 tablet 3   . hydrocortisone (CORTEF) 5 MG tablet Take 15 mg in am, 5 mg in early afternoon and 5 mg at 7 pm. Allow for extra doses in case of stress, illness, fever. 600 tablet 2   . levothyroxine (SYNTHROID) 125 MCG tablet Take 1 tablet (125 mcg total) by mouth every morning 30 tablet 2   . melatonin 3 mg tablet Take 3 mg by mouth nightly        . metoprolol succinate XL (Toprol XL) 25 MG 24 hr tablet Take 1 tablet (25 mg total) by mouth nightly 90 tablet 3   . modafinil (PROVIGIL) 100 MG tablet Take 50 mg by mouth daily PRN       . montelukast (SINGULAIR) 10 MG tablet TAKE 1 TABLET BY MOUTH EVERY NIGHT AT BEDTIME AS DIRECTED 90 tablet  3   . Motegrity 2 MG Tab Take 1 tablet by mouth daily     . Probiotic Product (ALIGN PO) Take 1 tablet by mouth daily     . promethazine (PHENERGAN) 25 MG suppository every 6 (six) hours as needed        . psyllium (Metamucil) 0.52 g capsule Take 1.6 g by mouth 2 (two)  times daily     . ramipril (ALTACE) 10 MG capsule Take 1 capsule (10 mg total) by mouth daily 90 capsule 3   . scopolamine (TRANSDERM-SCOP) Place 1 patch onto the skin every third day     . senna (SENOKOT) 8.6 MG tablet Take 1 tablet by mouth 2 (two) times daily        . simvastatin (ZOCOR) 20 MG tablet Take 1 tablet (20 mg total) by mouth nightly 90 tablet 0   . SYMBICORT 80-4.5 MCG/ACT inhaler Inhale 2 puffs into the lungs two times daily (Patient taking differently: 2 puffs 2 (two) times daily Have patient rinse mouth after use  ) 10.2 g 3   . tamsulosin (FLOMAX) 0.4 MG Cap Take 1 capsule (0.4 mg total) by mouth Daily after dinner 90 capsule 3   . Testosterone 20.25 MG/ACT (1.62%) Gel Apply 4 actuations daily (2 on each shoulder) 300 g 2   . valACYclovir HCL (VALTREX) 500 MG tablet Take 1 tablet (500 mg total) by mouth daily 90 tablet 3   . zolpidem (AMBIEN CR) 6.25 MG CR tablet        No current facility-administered medications on file prior to visit.     Vitals:    08/10/20 1339   BP: 139/73   Pulse: 70   Resp: 14   Temp: 98.2 F (36.8 C)   SpO2: 95%     Lab Results   Component Value Date    HGBA1C 6.2 (H) 11/27/2019    HGBA1C 6.3 (H) 09/06/2018    HGBA1C 6.2 (H) 02/13/2018     Lab Results   Component Value Date    WBC 14.51 (H) 05/11/2020    HGB 17.2 (H) 05/11/2020    HCT 50.4 (H) 05/11/2020    PLT 246 05/11/2020    CHOL 177 02/13/2018    TRIG 237 (H) 02/13/2018    HDL 54 02/13/2018    LDL 76 02/13/2018    ALT 45 05/11/2020    AST 29 05/11/2020    NA 140 05/11/2020    K 4.2 05/11/2020    CL 104 05/11/2020    CREAT 1.3 05/11/2020    BUN 18 05/11/2020    CO2 26 05/11/2020    TSH 0.155 (L) 11/27/2019    PSA 0.4 02/13/2018    INR 1.0 05/22/2018    GLU 113 (H) 05/11/2020    HGBA1C 6.2 (H) 11/27/2019     Lab Results   Component Value Date    TSH 0.155 (L) 11/27/2019    T4 7.4 09/06/2018         Review of Systems    Constitutional: fatigue and chronic pain  Eyes: negative  Respiratory: negative  Cardiovascular:  negative  Gastrointestinal: nausea  Musculoskeletal: negative   Neurological: negative  Behavioral: negative  Skin: negative  Hematology: negative      Examination  Vitals:    08/10/20 1339   BP: 139/73   Pulse: 70   Resp: 14   Temp: 98.2 F (36.8 C)   SpO2: 95%     Constitutional: Oriented to person, place, and time  and well-developed, well-nourished, and in no distress.   Head: Normocephalic and atraumatic.   Eyes: Conjunctivae and EOM are normal.   Extremities: No cyanosis, clubbing or edema.  Musculoskeletal: Normal range of motion.   Neurological: Alert and oriented to person, place, and time.   Psychiatric: Affect and judgment normal.  He does not have cushingoid features.      Assessment and plan    Hypopituitarism in the setting of brain surgery for meningioma. Patient is currently on [facility] for (AI), a combination of levothyroxine and liothyronine, and testosterone  We can continue to use a combination of LT4 ad LT3 for thyroid horone replacement as he seems to feel better with this combination. We will continue to monitor the TSH with target TSH in the 0.5 to 3 range  Target a testosterone level around 500 ng/dL  Monitor CBC, LFT's and PSA periodically   He can return in few months or as needed      Kameo Bains.H.Tamera Punt, MD

## 2020-08-11 ENCOUNTER — Inpatient Hospital Stay: Payer: Medicare Other | Admitting: Rehabilitative and Restorative Service Providers"

## 2020-08-11 DIAGNOSIS — M25511 Pain in right shoulder: Secondary | ICD-10-CM

## 2020-08-11 DIAGNOSIS — T148XXA Other injury of unspecified body region, initial encounter: Secondary | ICD-10-CM

## 2020-08-11 DIAGNOSIS — M549 Dorsalgia, unspecified: Secondary | ICD-10-CM

## 2020-08-11 NOTE — PT/OT Therapy Note (Signed)
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   Date of Service: 08/11/2020  Referring Physician: Modesto Charon, MD   Date of Injury: 06/05/2020  Date Care Plan Established/Reviewed: 07/09/2020  Date Treatment Started: 07/09/2020  End of Certification Date: 10/06/2020  Sessions in Plan of Care: 18  Surgery Date: No data was found    Visit Count: 5   Diagnosis:   1. Acute pain of right shoulder    2. Mid back pain on right side    3. Muscle strain      Subjective     Pain   Shannon [name] feels ~90% improvement with right shoulder and mid back pain since starting PT.General stiffness early in the morning for the last 10 years.Notice tip of right thumb goes numb after riding the [employer]inn with upper extremity assistance at home for 30 minutes.Discussed trying neck and shoulder stretches after bike,and try riding for 25 minutes instead.To monitor thumb numbness with other activities.    Social Support/Occupation  Lives in: multiple level home  Lives with: spouse  Occupation: retired    Precautions: No data was found  Allergies: Bextra [valdecoxib]    Standing AROM R shoulder: Elevation 140, Abduction in scapular plane 153    Supine R shoulder AROM: Flexion 168,Abduction 165,Internal Rotation 72,External Rotation 80              Treatment     Therapeutic Exercises - Justified to address any of the following: develop strength, endurance, ROM and/or flexibility.   AROM measurements taken  - Sidelying Thoracic Rotation with Open Book, x 10 reps with 5 sec hold  * cuing to emphasize thoracic rotation VC/TC     Sci Fit UE only x 4 min alternating directions(not today due to time)  Added:  Standing GTB Rows x 15  Standing GTB Finning with upright posture and tactile cues for scapular retraction x 10  *cuing to retract scapula    Standing GTB Ext x 15  Started LT GTB x 15      Below deferred today:    - Cross Body Rhomboid Stretch, 30 sec x 3 R only  - Seated Cervical Sidebending Stretch, 30 sec x 3 R only (cues for gentle stretch  and to only perform OP if comfortable)  - Introduced LS Stretch, 30 sec x 3 R only     - Reviewed Thread the Needle on Wall, x 10 reps with 5 sec hold B  - Reviewed Standing Pec Stretch with Arm at 60, 30 sec x 3 R only (cues for gentle stretch; added to HEP)    *Therapist providing verbal/tactile cues to improve form and gentle stretch with exercises above.        Neuromuscular Re-Education - Justified to address any of the following: of movement, balance, coordination, kinesthetic sense, posture and/or proprioception for sitting and/or standing activities.   - Instructed in Side Lying Scap Retraction, x 10 reps with 5 sec hold    - Progressed to Side Lying Scapular PNF for downward depression with therapist providing manual resistance for isometric hold, slow reversal and COI    Manual Therapy - Justified to address any of the following:  Mobilization of joints and soft tissues, manipulation, manual lymphatic drainage, and/or manual traction.    Extensive STM today:    Pt supine and STM- ischemic pressure to R rhomboid and infaspinatus     L Side Lying: STM to R medial scapular border, rhomboids, infraspinatus with use of theragun  Deferred today  Prone: STM to R medial scapular musculature with hand on back for subscap release; Gr I-III thoracic PA joint mobilizations    Home Exercises   Access Code: [email or web address]  URL: [email or web address]  Date: 07/09/2020  Prepared by: Mauri Brooklyn      Modalities   Ice x 10 min to posterior R shoulder w extra pillows used to support ice pack and create better ice contact.(Ice at home today)       ---      ---   Total Time    Timed Minutes 45 minutes   Total Time 45 minutes        Assessment   Palpable muscle tension right T 2 to T 7 paraspinals,subscapularis,lower trapezius,and levator scapula.Tolerated Theragun and PT STM in left side lying position without pain.Tight proximal latissimus dorsi and triceps noted with STM and MT.Negative right upper extremity nerve  glides.Would benefit from increased dominant shoulder elevation and internal rotation for bathing and dressing activities without pain.  Plan   Progress POC to improve muscle extensibility, pain and overall function.Increase peri-scapular muscle strengthening to HEP if able.      Goals    Goal 1: Increase mid-trap/lower trap strength by 1/2 to 1 grade to allow patient to pull open door with improved posture and less pain.   Sessions: 18      Goal 2: Patient will demonstrate improve shoulder ER/IR strength by 1/2 to 1 grade to allow the patient to wash/dress without increased strain or difficulty.   Sessions: 18      Goal 3: Patient will demonstrate independence in prescribed HEP with proper form, sets and reps for safe discharge to an independent program.   Sessions: 18      Goal 4: Increase shoulder flexion AROM to 140 degrees to allow pt to lift/lower objects with R UE without increase in pain.   Sessions: 18                                Barnet Glasgow , PT

## 2020-08-17 ENCOUNTER — Inpatient Hospital Stay: Payer: Medicare Other | Admitting: Rehabilitative and Restorative Service Providers"

## 2020-08-17 DIAGNOSIS — M549 Dorsalgia, unspecified: Secondary | ICD-10-CM

## 2020-08-17 DIAGNOSIS — T148XXA Other injury of unspecified body region, initial encounter: Secondary | ICD-10-CM

## 2020-08-17 DIAGNOSIS — M25511 Pain in right shoulder: Secondary | ICD-10-CM

## 2020-08-17 NOTE — PT/OT Therapy Note (Signed)
Name: Jamison Neighbor, MD Age: 69 y.o.   Date of Service: 08/17/2020  Referring Physician: Modesto Charon, MD   Date of Injury: 06/05/2020  Date Care Plan Established/Reviewed: 07/09/2020  Date Treatment Started: 07/09/2020  End of Certification Date: 10/06/2020  Sessions in Plan of Care: 18  Surgery Date: NA  Visit Count: 6   Diagnosis:   1. Acute pain of right shoulder    2. Mid back pain on right side    3. Muscle strain      Subjective     Pain   Limited reaching overhead with right dominant arm.General stiffness along inner and outer right shoulder blade limits tolerance of sleeping on side.    Social Support/Occupation  Lives in: multiple level home  Lives with: spouse  Occupation: retired    Precautions: No data was found  Allergies: Bextra [valdecoxib]    Standing AROM R shoulder: Elevation 140 with T/S compensatory extension,Elevation in scapular plane 157      Right  AROM  07/09/20   Right AROM  08/17/20 Shoulder   125 140 Flexion   WFL WFL Abduction   T 9 65 Internal Rotation   T 2 75 External Rotation     Initial R  07/09/20   R  08/17/20 UE Strength  MMT /5   4+ 4+ Shoulder Abduction   4 5- Shoulder External Rotation   4+ 5- Shoulder Internal Rotation   3+ 5- Middle Trapezius   3 4+ Lower Trapezius                 Treatment     Therapeutic Exercises - Justified to address any of the following: develop strength, endurance, ROM and/or flexibility.   AROM measurements taken  - Sidelying Thoracic Rotation with Open Book, x 10 reps with 5 sec hold  * cuing to emphasize thoracic rotation VC/TC     Sci Fit UE only x 4 min alternating directions(not today due to time)  Added:  Standing GTB Rows x 15  Standing GTB Finning with upright posture and tactile cues for scapular retraction x 10  *cuing to retract scapula    Standing GTB Ext x 15  Started LT GTB x 15  Right triceps extension with YTB x 15  Right vertical ADD with YTB x 15 with tactile cues to avoid UT substitution    *Therapist providing  verbal/tactile cues to improve form and gentle stretch with exercises above.        Neuromuscular Re-Education - Justified to address any of the following: of movement, balance, coordination, kinesthetic sense, posture and/or proprioception for sitting and/or standing activities.   Defer today due to time:  - Instructed in Side Lying Scap Retraction, x 10 reps with 5 sec hold    - Progressed to Side Lying Scapular PNF for downward depression with therapist providing manual resistance for isometric hold, slow reversal and COI    Manual Therapy - Justified to address any of the following:  Mobilization of joints and soft tissues, manipulation, manual lymphatic drainage, and/or manual traction.    Extensive STM today:    Pt supine and STM- ischemic pressure to R rhomboid and infaspinatus     L Side Lying: STM to R medial scapular border, rhomboids, infraspinatus with use of theragun    Deferred today  Prone: STM to R medial scapular musculature with hand on back for subscap release; Gr I-III thoracic PA joint mobilizations    Home Exercises   Access  Code: [email or web address]  URL: [email or web address]  Date: 07/09/2020  Prepared by: Mauri Brooklyn      Modalities   Ice x 10 min to posterior R shoulder w extra pillows used to support ice pack and create better ice contact.(Ice at home today)       ---      ---   Total Time    Timed Minutes 45 minutes   Total Time 45 minutes        Assessment   Palpable muscle tension right T 2 to T 7 paraspinals,subscapularis,lower trapezius,and levator scapula.Tolerated Theragun and PT STM in left side lying position without pain.Tight proximal latissimus dorsi, triceps,and subscapularis noted with STM and MT.Negative right upper extremity nerve glides.Would benefit from increased dominant shoulder elevation and internal rotation AROM and abduction,lower trapezius strength, for bathing and dressing activities without pain.  Plan   Progress POC to improve muscle extensibility, pain and  overall function.Increase peri-scapular YTBand muscle strengthening to HEP if able.PCP consult 09/05/20 for referral to address long history of hip and low back pain.      Goals    Goal 1: Increase mid-trap/lower trap strength by 1/2 to 1 grade to allow patient to pull open door with improved posture and less pain.   Sessions: 18      Goal 2: Patient will demonstrate improve shoulder ER/IR strength by 1/2 to 1 grade to allow the patient to wash/dress without increased strain or difficulty.   Sessions: 18      Goal 3: Patient will demonstrate independence in prescribed HEP with proper form, sets and reps for safe discharge to an independent program.   Sessions: 18      Goal 4: Increase shoulder flexion AROM to 140 degrees to allow pt to lift/lower objects with R UE without increase in pain.   Sessions: [address]                                Barnet Glasgow , PT,CLT

## 2020-08-20 ENCOUNTER — Inpatient Hospital Stay: Payer: Medicare Other | Admitting: Rehabilitative and Restorative Service Providers"

## 2020-08-20 ENCOUNTER — Telehealth: Payer: Self-pay | Admitting: Rehabilitative and Restorative Service Providers"

## 2020-08-20 NOTE — Telephone Encounter (Signed)
Therapist called patient regarding missed appointment on 12/30 at 12:15 pm. Patient reports he thought the appointment was for 12/31 and did not want to reschedule today because his kids were in town. Therapist informed him that this was the last appointment he had scheduled and offered to schedule more. Patient declined and stated he is planning on going back to the doctor for referral for back.    Mauri Brooklyn, PT, DPT

## 2020-09-02 ENCOUNTER — Telehealth: Payer: Self-pay | Admitting: Internal Medicine

## 2020-09-02 NOTE — Telephone Encounter (Signed)
Call to patient in preparation of upcoming appointment.     COVID Screening completed below.     COVID-19 Questionnaire:  Last updated: Dec 28, 2018    Fever: No    Cough: No    Dyspnea: No    Fatigue: No    Muscle or body aches: No    Headache: No    New loss of taste or smell: No    Nausea or vomiting: No    Diarrhea:  No    Close contact with a COVID-19 person?: No  Recently been tested for COVID-19?: No  Ever been diagnosed with COVID-19?: No  Live in a group living residence (such as an assisted-living facility, nursing home, shelter, or dormitory)?: No    Work in a healthcare facility?: No       Patient instructed to wear a mask into the building, call when parked prior to coming into the office to ensure there is no one else in the waiting area, and to return call to the office should any symptoms appear prior to appointment time or patient receive a positive test.

## 2020-09-03 ENCOUNTER — Encounter: Payer: Self-pay | Admitting: Internal Medicine

## 2020-09-03 ENCOUNTER — Ambulatory Visit (INDEPENDENT_AMBULATORY_CARE_PROVIDER_SITE_OTHER): Payer: Medicare Other | Admitting: Internal Medicine

## 2020-09-03 VITALS — BP 136/80 | HR 71 | Temp 97.7°F | Resp 15 | Wt 186.6 lb

## 2020-09-03 DIAGNOSIS — M797 Fibromyalgia: Secondary | ICD-10-CM

## 2020-09-03 DIAGNOSIS — R5382 Chronic fatigue, unspecified: Secondary | ICD-10-CM

## 2020-09-03 DIAGNOSIS — I10 Essential (primary) hypertension: Secondary | ICD-10-CM

## 2020-09-03 DIAGNOSIS — E782 Mixed hyperlipidemia: Secondary | ICD-10-CM

## 2020-09-03 NOTE — Progress Notes (Signed)
Chief Complaint   Patient presents with   . 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     At our last visit we discussed a trial of Celebrex and Valtrex for this chronic pain and fibromyalgia. Celebrex made breathing worse with asthma and had to take course of predisone. Has been taking Valtrex and thinks he is feeling a little bit better and would like to continue taking this.   Physical therapy helped with his R upper scapular pain. Has been usin PT principles and back getting better also.     He is investigating getting a compounded nasal spray mometasone ipratropium and diphenhydramine.     He will be getting blood work later this month at the direction of his endocrinologist.     Active Problems  Patient Active Problem List    Diagnosis Date Noted   . Secondary adrenal insufficiency 01/17/2017     Priority: High     - 04/14/20: eval w Dr. Tamera Punt: Hypopituitarism in the setting of brain surgery for meningioma. Patient is currently on [facility] for (AI), levothyroxine, testosterone and growth hormone replacement. Will order an ACTH stimulation tests to see how much of adrenal reserve he has. As far a steststerone (T), his T levels are sub-optimal and I recommended a target T level in the 440 to 500 ng/dL for optimal efficacy. Will titrate the dose of the topical androgen to achieve that goal. Repeat T level in few weeks after dose totration He can return in few months or as needed.  - 04/26/18: Dr. Beckie Salts [facility]): initial eval showed possible borderline glucocorticoid deficiency.  He has been taking supraphysiologic doses of hydrocortisone since 2017.  We do not think he has a hydrocortisone absorption problem based on random serum cortisol and 24-hour values.  We believe episodes of abd pain and nausea improved with SDS because of anti-inflammatory effects.  We are unable to generate a unifying endocrine  dx that explains sx.  Pt understands dangers of continuing supraphysiologic glucocorticoids but reluctant to reduce for fear of worsening illness.  We do feel other hormone replacement doses are more appropriate.  We do not rec changes. Pt asked for referral to fibromyalgia specialist.  Pt with h/o primary hypothy has empty sella and biochemical e/o hypogonadism and GH deficiency.  Originally, ACTH test borderline, but now after long-term glucocorticoid tx he has obvious central AI.  His thyroid and GH doses are if anything on high side.  We cannot attrribute sx to underdosing.  Explained this to Dr. [name] and have contacted [facility] to see if fibromyalgia expert or clinical trial are available .  - 09/29/17 Dr. Tamera Punt endo: Hypopituitarism in setting of brain surgery for meningioma. Pt currently on HC for (AI), levothyroxine, testosterone and GH replacement. The question as I d/w him is whether he is using HC to treat non specific sx or what he refers to as "quality of life" or whether he does need high doses due to some issue w HC metabolism. I explained to him sx he experiences are not specific for adrenal disease and there is concern about chronic over exposure to corticosteroid tx. I encouraged him to taper dose of HC to physiologic dose, in range of 20 mg in am and 10 mg in pm. One can also use dose that is based on body surface. In case of suspicion  of "adrenal crisis", I suggested he use one injection of Solucortef rather than high oral doses of HC for several days. I also recd opinion w Dr. Leonides Grills, Chief, Endocrinology at [facility], in order to discuss adrenal issue. I'm happy to continue to see him at [facility]. I also explained that have not prescribed GH replacement for adults so he will continue to follow with [facility]. He can return in few mos or as needed.  - 09/06/17: eval w Endocrine Dr. Eyvonne Mechanic [facility]): Although after we dx secondary AI, GH deficiency, hypogonadotropic hypogonadism, and primary hypot  a/w a partially empty sella on imaging, and he had initial + response to replacement as time has gone on, he has not done well and episodes where he gets very ill w muscle aches like he always has flu nausea, vomiting, LH and in general on physiologic hydrocortisone, he awakes in am with discomfort, particularly in back , and after meds it will be several hrs until he has energy to get up and do minor tasks. Around [place], he was ill with viral illness for 3 days and had crisis. He was tx w 100 mg of hydrocortisone orally every 8 hrs, and sx resolved. He subsequently tapered on oral hydrocortisone and in last 3-4 days has been taking between 130-110 mg total per day and continues to feel well as he tapering this. We are collecting a 24-hr urine free cortisol on this dose. His other levels are interesting as well. He is on GH at 0.3 mg a day. In past, this had IGF-1's at upper end of nl range to mildly elevated, now Z-score is just +1.28 and despite taking AndroGel cream on daily basis, testosterone level was 19, whereas in past this was nl. He is sure he had not forgotten testosterone. His thyroid tests similar to past with a nl free T4 of 1.5 and TSH mildly suppressed. The current ACTH is less than 1. A/P  #1 Empty sella w pituitary insufficiency  #2 Secondary AI  It is unusual that he feels well now after several days of high-dose hydrocortisone. Either he is malabsorbing hydrocortisone, he is super metabolizing hydrocortisone, he has developed cortisol resistance, or he has undx steroid-sensitive disease. There is really no evid for other disease with other testing done including multiple rheum antibodies. I did order genetic gene interaction testing and waiting for review of this from pharmacologist to see if suggestion of metabolism abnormalities re various hormones. The 24-hour urine cortisol should give hint regarding absorption. Plan we have developed is to continue to slowly taper oral hydrocortisone  over next several days to weeks, and I would note a tapering schedule depending on clinical response. I will get a baseline bone density and body composition, and check cortisol level morning 2-3 hrs after he took 50 mg of Cortef, and I am going to repeat testosterone 1-2 hrs after he applied AndroGel. #3 Secondary hypogonadism, on replacement : Unclear why testosterone is so different than before where we test this. It may be lab error, but it is unlikely he did not apply testosterone. #4 GH deficiency:  unclear why same dose is resulting in lower IGF-1, but increase dose to 0.4 mg per day.  #5 Primary hypot, on replacement  There is likely pituitary component, and will maintain replacement keeping FT4 stable at 1.5.  #6 Cognitive dysfunction  evaluated a year ago w/o specific dx. He feels now after several days of higher doses of hydrocortisone thinking is clearing. Mild ascending  aortic dilation and aortic insufficiency  Would rec'd repeating echo next year, #10 Meningioma  unchanged on MRI from 2008 and confirms slight increase from 2017. #11 Muscle pain  This has resolved with hydrocortisone. I doubt there is underlying muscle disease,   - 08/31/17: Pt email: advised by pcp to take 100 mg of hydrocortisone Q8H for three days and then double usual dose (25-30 mg/day) until I returned to [facility]. The rescue dosage worked like Pharmacist, community. I began to feel better almost immediately and now nausea, loss of appetite, dizziness with standing, chills, cold feet, and perceived cognitive difficulties have markedly improved. I cannot remember last time I felt this good!   - 08/25/17: Dr. Toni Amend: pt with possible adrenal insuff while under stress. Trial of hydrocortisone to 100mg  tid for 24-48 hours per [facility].  - 11/23/16: eval w Endocrine Dr. Tawanna Cooler [facility]): due for reassessment of FT4 and IGF 1 in 4 weeks after changing dose on 10/27/16.  Pursue recs for insomnia from psych. MRI did not show signs of dementia. PFT nl.  FU 6-12 mos       . Chronic fatigue syndrome with fibromyalgia 01/24/2013     Priority: High     Aug 2006 noted onset of severe fatigue and myalgias. Extensive eval for CT and autoimmune disease at [facility] / [facility]. Discovered to have inadequate thyroid and low free testosterone. He was eval by endocrine ultimately dx w fibromyalgia / chronic fatigue syndrome prescribed various meds including provigil, cymbalta, gabapentin and pregabalin, and elavil (all w/o improvement).     - 06/05/20: Pt is interested in trial of Valtrex to help with potential treatment of fibromyalgia.  I told him there are no clear data or guideline or recs for use of this in ps with his medical history (he has done research on own suggesting it could be of benefit).  He is interested in doing a short trial of medication at suppressive dose to see if this also helps with symptoms. I reviewed risks benefits and side effects of medication with him and he would like to proceed.  Rec trial for 90 days.   - 03/30/20: eval w Dr. Romilda Garret (ID): Chronic fatigue brain fog fibromyalgia unresponsive to medical therapies.  Orthostatic hypotension.  Glossitis.  Poor sleep quality.  Recommend labs for the research follow-up in 4 weeks  - 01/21/20: eval w Rheum Dr. Mellody Dance - FMS - has not responded to medical therapies. No new options have come to clinical care since FDA approval of duloxetine, pregabalin, milnacripran for these symptoms Dysautonomia - commonly associated with FMS and other forms of central sensitivity state. Not known to be a cause and effect relationship.  - 10/03/18: email from Dr. Jon Billings: [facility] Labwork for paraneoplastic antibodies that can be a/w peripheral neuropathy negative.  I strongly suspect numbness in feet developed is prediabetes (A1C 6.3). Lose weight and exercise. The only lab of concern is B6 over 80. I don't feel this is causing neuropathy. Elevated B6 has been shown to cause neuropathy, primarily small fiber, in animal and likely does  in pts as well.  I get concerned if level is >100, but 80s not far from that. If you are taking B6 (I.e, pyridoxine), I suggest stop. I also want to emphasize though we chased this, I don't believe this is cause (prediabetes, B6, or small fiber neuropathy) of majority of your sx. You have chronic fatigue syndrome and I would continue to advise you to seek care with specialty center  for this challenging disorder.   - Skin biopsy 08/06/18: Nl epidermal nerve fiber density in two distal sites, but markedly reduced over proximal thigh. [place] red negative. SUMMARY: Unclear significance of reduced density of IENFD over proximal thigh. May reflect meralgia paresthetica, but not apparent from pt's sx. No indication of length-dependent small fiber neuropathy on testing. If present, extremely mild and restricted to feet. If no meralgia paresthetica, could consider Sjogren's testing, if not done, and anti-Hu for non-length dependent small fiber neuropathy.  - 08/06/18: eval w Dr Bosie Clos [facility]): h/o fatigue and myalgias in 2006 after having viral prodrome continues to be c/w chronic fatigue.  Additionally, insomnia with difficulty falling sleep maintaining sleep.  He reports sleeps no more than 1-1.5 hrs at time.  This likely contribute sig to fatigue and somnolence.  Rec trial of amitriptyline.  Amitriptyline may also benefit chronic pain.  For chronic pain/myalgias he is taking tramadol, ( which we rec stopping ) buprenorphine and clonazepam.  We advised him to minimize exposure to opiates and BZD as these can make somnolent fatigued and be factor in causing cognitive issues.  Is not clear what caused his paraphasic errors at [facility] 07/30/18, but suspect fatigue / meds played role.  He reports 1 yr of numbness and tingling in feet.  Exam sig only for mildly decreased sensation to temp bilateral feet - intact sensation pinprick.  He also reports episodes of nausea dry heaving.  To eval for  possible small fiber neuropathy causing peripheral numbness or tingling and possible gastric dysmotility suggest skin bx which he elected to pursue. In addition to episodes of nausea and dry heaving.  He also reports having multiple episodes of loose stools and abd cramping.  Suspect component of IBS, but rec further eval w GI.  Plan.  For insomnia in setting of chronic fatigue -amitriptyline 10 mg at night for 1 week, then every week, increase by 10 mg to a dose of 40 mg.  Rec stopping tramadol skin biopsy for WU of small fiber neuropathy.  Consider eval at [facility] for chronic fatigue.  GI referral for eval of loose stools and cramping.  Continue w neurosurgery or [facility] for meningioma.  - 09/08/17: eval with Rheum ([facility]): A/P #1 Pituitary insufficiency  #2 Secondary AI  #3 Myalgias  #4 Sicca sx: suspicion is pt has underlying rheum process that accounts for steroid responsive sx here is exceedingly low. The constellation of sx he describes during a crisis not typical for autoimmune disease. Inflammatory myopathy would typically present with muscle weakness, and muscle strength preserved, and he is endorsing myalgia. I reviewed pic he brought in with him for episodes in hands / feet, and I am at loss. It does not look like CRPS, erythemalgia, or Raynaud's. He has had extensive testing here so far including ANA, ENAs, complements, all within nl limits as are inflammatory markers. To complete eval, I think we can check cryoglobulins, ANCAs, CCP, and pain, which seems myofascial than intra-articular or muscular, would rec obtaining x-rays of spine as well as feet ankles. I will review results on portal.       . Orthostatic hypotension 12/27/2019     Priority: Medium     - eval w Cardiology Dr. Tami Ribas 06/02/20: 1. Orthostasis, complex and related probably to his underlying fibromyalgia and probable dysautonomia.  Improved recently with better nutrition and resolution of nausea. 2. Hypertensive heart  disease on echo 09/10/2018 he has LVH and mild  aortic root dilatation. 3. Dyslipidemia-simvastatin use. 4. Partial resection of meningioma in 2004. 5. Panhypopituitarism-on replacement therapy. 6. Peripheral neuropathy and some cognitive issues. 7. Fibromyalgia and chronic fatigue syndrome. Recs: 1.  No changes to medications at this time. 2.  Continue gradual increase in exercise and thigh-high compression stockings with avoidance of dehydration or sodium depletion. 3.  I will see him again in 6 months and we can discuss when to do his next echo to look at his his aorta size.   - eval w cardiolog NP 05/14/20: 1.Decrease his Ramipril form 20mg  to 10mg  to assist with correcting orthostasis. If his sitting BP is or above, he can take the additional tab of Ramipril 2.         Continue hydration, liberalize salt intake, compression stockings 3.Continue monitoring blood pressure and heart rate at home. 4.         Continue to followup with his neurologist, PCP and GI specialists 5.Follow-up with Dr.Nayak as scheduled in approximately 3 weeks  - eval w [facility] NP 03/16/20: Complex symptomatic orthostatichypotension with spells of nausea and fatigue, likely related to fibromyalgia and probable dysautonomia.  He has had workups at [facility] and [facility] and sees specialists locally. Hypertension, not ideally controlled although would not be overly aggressive in lowering blood pressures due to #1. Longstanding mild AV regurgitation withamildly dilated asc aorta, stable by most recent echo Jan 2020. on statin and Zetia. Most recent LDL 76 2019. Prior partial meningioma resection in 2004. Panhypopituitarism. Hypothyroidism. Peripheral neuropathy. Cognitive issues. Fibromyalgiaandchronic fatiguesyndrome,which remainquite debilitating. REC: 1. Continue current meds. 2. Continue monitoring pressure and rate 3. Follow-up with Dr. Tami Ribas in 3 to 4 months, sooner as  needed.   - eval w [facility] NP 02/05/20 Complex symptomatic orthostatichypotension, likely related to fibromyalgia and probable dysautonomia.  Several recent changes to antihypertensive regimen with subsequent improvement both in blood pressures and symptoms. Hypertension, controlled on current regimen although still with high blood pressure spikes. Longstanding mild aortic valve regurgitation with a mildly dilated ascending aorta, stable by most recent echocardiogram in January 2020. Hyperlipidemia, on statin and Zetia. Most recent LDL 76 in 2019. Prior partial meningioma resection in 2004. Panhypopituitarism. Hypothyroidism. Peripheral neuropathy. Cognitive issues. Fibromyalgia and chronic fatigue syndrome, which are quite debilitating. RECS: 1. Increase Toprol to 50 mg daily.  He will take at bedtime. 2. Continue remainder of cardiac medications, including higher dose of amlodipine. 3.  Continue use of thigh-high compression stockings.  These have helped significantly. 4. Continue monitoring pressure.  5. Telehealth follow-up with an APP in 3 weeks, sooner as needed.  - eval w cardiology Dr. Tami Ribas 01/16/20: 1.  Orthostasis, complex and related probably to underlying fibromyalgia and probable dysautonomia.  I explained salt loading despite hypertension may be useful to him and that 1 out of 5 patients is sodium sensitive with hypertension.  If his hypertension worsens he can of course back off.  I told him thigh-high compression stockings are more effective than anything knee-high and he will try those.  He already has rx from [facility], NP.  He is going to insist on thigh-high.  We also discussed restarting a BB but a beta 1 selective beta-blocker such as metoprolol succinate 2.  heart disease on echo 09/10/2018 he has LVH and mild aortic root dilatation. 3.  Dyslipidemia-simvastatin use. 4.  Partial resection of meningioma in 2004. 5.  Panhypopituitarism-on replacement therapy. 6.  Peripheral  neuropathy and some cognitive issues. 7.  Fibromyalgia and chronic fatigue syndrome. Rec: 1.  Thigh-high compression stockings 20 to 30 mm graded 2.  Begin metoprolol succinate 25 mg daily.  We may need to increase that to twice daily ultimately. 3.  Loosen salt restriction.  He will even attempt careful salt loading. 4.  He has a follow-up visit in a couple of weeks with the APP.         [name] Irritable bowel syndrome with constipation / Cyclic vomiting syndrome 02/11/2019     Priority: Medium     - 05/21/20: Normal solid-phase gastric emptying study  - 04/30/20 - eval w Dr Jodie Echevaria: He has been for infusions of IV fluid 3 times since last visit for nausea and vomiting.  Continued on Motegrity TCA and xifaxan -though the meningioma is growing its not thought to be the cause.  He is starting a higher dose of hydrocortisone due to adrenal insufficiency.  He only has a few hours a day with no nausea.  He actually has not vomited but the nausea is unrelenting.  Neurology gives him granisetron every few weeks.  Amitriptyline.  ACTH challenge was done by endocrinology.  His most to start a scopolamine patch today and consider holding Bel[place]uca pain meds.  Continue scopolamine patch continue pantoprazole continue Zofran Phenergan follow-up 4 weeks  - 04/23/20 evaluation with Dr. Jodie Echevaria: At last visit held the nighttime TCA and this did not make a difference.  He recently started it back up because of insomnia and this helped.  Held Motegrity for breath test but symptoms recurred so he did restart it and the Motegrity helped.  He was quite nauseous and went to neurologist and was given granisetron IV and another migraine medication a markedly improved.  Still uses Phenergan suppository and only with occasional breakthrough.  Meningioma is thought to have grown again and borderline in size for resection.  He is going back in a month where he has no cycles.  Continue pantoprazole Zofran promethazine start amitriptyline 25 mg nightly continue  Motegrity start Xifaxan 3 times a day for IBS with diarrhea 14 days.  Follow-up 6 weeks  - 03/02/20: eval w Dr. Jodie Echevaria: He is unable to repeat SIBO test due to family issues.  He stopped monitor antihypertensive due to this.  Nausea remains bad in morning but better in afternoon and evening.  Phenergan as needed.  This does help.  He does think amitriptyline helps sleep.  Bowel movements controlled a "life saver" on Motegrity.  Continue Phenergan Zofran and Compazine stop amitriptyline.  Option of holding TCA for now and seeing if improvement on office to do latter for now.  Continue Motegrity follow-up 3 weeks  - 01/17/20: TCA was decreased due to some orthostasis.  Linzess held and started on [employer].  He feels much better for several reasons.  The dysautonomia and hypotension correlated with nausea.  Coreg was held today no nausea but still some orthostasis but less severe.  Compression stockings and salt recommended.  Recommend start Motegrity follow-up 6 weeks  - 12/20/19: eval w Dr. Jodie Echevaria: He was continued on Linzess TCA antiemetics Protonix at last visit.  He was diagnosed with SIBO and given trial xifaxan.  He did not have much improvement with it.  He continued to have symptoms of nausea and dizziness.  Phenergan suppository.  Some orthostatic hypotension.  He does not have POTS but may be dysautonomia based on male work-up.  He does have adrenal insufficiency.  He continues to complain of loss of appetite fatigue  weight loss.  He was somewhat dehydrated but able to rally and drink more.  He was having constipation and nausea so changed the Linzess to Motegrity which he finds helpful.  Continue Zofran twice daily as needed.  Continue Compazine as needed.  He was doing well on TCA but with the orthostasis we will cut down the dose to 20 mg nightly. Stop Xifaxan and linzess - start motegrity. Start phenergan suppository.  Decreased dose TCA increase fluids recommend cardiology visit.  Follow-up 4 weeks  - [facility] /  EGD 04/16/19: A few medium mouth diverticula in the sigmoid colon.  The entire colon appeared normal.  Biopsies were taken  EGD Dr Jodie Echevaria 04/16/19: Examined esophagus normal.  Biopsies taken.  Small hiatal hernia present.  Patchy mild inflammation found in gastric body in gastric antrum. PATH: Normal gastric antral mucosa negative for gastritis H. pylori herpes viral cytopathic changes intestinal metaplasia or dysplasia.  Gastric body mucosa showing mild superficial erosive injury negative for inflammation Helicobacter herpes viral cytopathic changes intestinal metaplasia or dysplasia.  GE junction biopsy: Possible reflux GE mucosa showing chronic inflammation and hyperplastic changes negative for intestinal metaplasia or dysplasia.  Mid esophagus biopsy: Esophageal candidiasis squamous mucosa showing superficial acute inflammation and intraepithelial fungal elements consistent with Candida species   - 12/18/18: eval w GI Dr. Donell Sievert: He looks better than at last visit.  The nausea reflux are better.  He may have fibromyalgia and possibly AI being managed by appropriate specialist.  He continues to struggle with fatigue.  An extensive work-up for LFTs unremarkable besides fatty liver.  He only needs pantoprazole twice a week.  He has Compazine and Phenergan for bad episodes but has not needed it.  He remains on hydrocortisone.  The fatigue continues but manageable.  Trying to get back on Provigil but awaiting approval.  No pain or changes in bowel habits.  Cramps are better.  He added a yogurt shake and fruit and prunes.  Rec continue current GI meds follow-up in 6 months  - 10/09/18 eval w GI Dr. Donell Sievert: At last visit given Phenergan and pantoprazole.  [place] with fatty liver and left renal cyst.  Labs negative HCV HBV.  Neg for other causes of abnl LFT.  He has been seen at [facility] and a nerve biopsy done with nonspecific findings.  He is watching sugar intake.  He reports less nausea less feelings of  incapacitation.  He is on new pain med and this works well.  He is on Elavil at bedtime and sleeps better.  He does feel better still fatigue but try to fight through it.  He is seeing endocrinologist at [facility] and [facility].  Nausea and reflux are better with treatment.  Rec low carbohydrate diet.  Lean protein and low-fat diet.  Gave him names of local endocrinologist and rheum to consult with closer to home.  Follow-up in 3 months         . Aortic valve insufficiency 09/07/2018     Priority: Medium     - 09/07/18: eval w cardiology Dr. Harlow Asa: recommend echo, continue current antihypertensives, FU 1 year  -ECHO at [facility] 2018: described mild AR and mildly enlarged aortic root LV nl     . IFG (impaired fasting glucose) 02/13/2018     Priority: Medium     - A1C 6.2 as of 11/27/19  - A1C 6.3 09/06/18  - A1C 6.2 02/14/18 FBG 104     . History of insomnia  01/17/2017     Priority: Medium     Chronic, with previous evaluations with multiple providers    -07/27/20 eval w Dr. Theodoro Grist: He recently recovered from an increased bout of insomnia.  He reports he took Ambien 10 mg for short period to help reset his sleep cycle consider Belsomra in the future.  Follow-up in 4 months or as needed  - 07/12/20: Apt with Dr. Theodoro Grist (sleep) and I am to double Ambien (total 10 mg), Ambien ER 6.25 mg and Belsomra to see which works best. So far doubling Ambien or Belsomra works best. It's interesting, after a few nights of double Ambien, my brain seems to have reset and I can go to sleep without anything. This is nice since I don't want to use a sleep aid every night. It's amazing how much better I feel with more sleep, particularly REM sleep. I have been using a fit watch to verify the total amount of sleep I get including REM sleep. Dr. Theodoro Grist also gave me a refill for Clonazepam. The last script was 7/27 #30 and I still have 20 left so I am using it sparingly.   - 07/09/20 eval w Dr. Theodoro Grist: Chronic sleeping difficulties in the  setting of fibromyalgia chronic pain mood disorder and circadian rhythm disorder.  Symptoms have not been well controlled with combination of melatonin 10 mg and amitriptyline 40 mg and with occasional use of Ambien.  Belsomra was initially considered but his insurance limiting.  He will try Ambien ER 6.25 mg.  Side effects reviewed.  Follow-up 2 weeks  - appears pt has received clonazepam from Dr. Theodoro Grist on 04/10/20, 03/17/20 and 01/27/20  - 02/09/20: eval w Dr. Theodoro Grist: Continues to benefit from taking melatonin and amitriptyline at night.  Occasionally he will take Ambien.  Symptoms well controlled with combination of melatonin and amitriptyline 50 mg at night.  Complete sleep study in future.  Continue modafinil follow-up 5 months  - Feb1, 2021: Eval with Dr. Eilleen Kempf: Sx well controlled with combination melatonin 10 mg and amitriptyline 50 mg at night.  Complete sleep study once pandemic has subsided.  Continue modafinil follow-up 6 months  - now seeing CBT therapist Dr. Lanier Ensign  - 10/31/16: eval w sleep medicine Dr. Ala Dach  [facility]): reviewed records from sleep study Sept 2017 and CPAP in Oct 2017 ... My opinion  his OSA is not sig enough to require effort on using CPAP consistently ... Particularly since he reports not having found CPAP helpful or restful, best to set it aside and at some point another sleep lab study and titration may be helpful.      . Growth hormone deficiency 03/29/2016     Priority: Medium     - Jan 2019: eval w Dr. Madie Reno [facility]) see recs under adrenal insufficiency in problem list  -- 10/31/16: eval w Endocrine Dr. Tawanna Cooler [facility]): increase GH to 0.4 mg daily  - 08/26/16:  eval w Dr. Tawanna Cooler [facility]): suspect that these issues will improve longer he is on [facility] .Marland KitchenMarland Kitchen Therefore am not concerned with him taking 7.5 mg -10 mg of pred a day or equiv if that is what he needs in next several months.          . History of meningioma of the brain 11/09/2015     Priority: Medium      12/09/02: s/p R parieto-occipital parasagittal craniotomy for 85-90% resection of R pareito-occipital falx meningioma that originally measured 61 x 24  mm    - 07/27/20: eval w Dr Nilda Calamity: His meningioma is stable and is asymptomatic.  He has chronic pain depression and insomnia.  He is doing much better recently as his nausea resolved presumably because he weaned his Belbuca and his insomnia and fatigue improved with Ambien and Belsomra alternating nightly.  He is not experiencing headaches.  His meningioma is stable but he should have a repeat study in 1 year.  I will see him after that  - 04/29/20: eval w Dr. Nilda Calamity: His meningioma seems to be progressive in size over the past year.  In my estimation although this is not noted on the report.  Is asymptomatic.  I would like him to get a repeat scan in about 6 months to ensure there is no acceleration in growth.  His neuro symptoms of nausea are of uncertain etiology.  I reiterated that the meningioma is not causing any nausea or dizziness.  Is a chronic occluded superior sagittal sinus which is not likely to be true triggering symptoms either but is relevant so I would like him to have an evaluation from ophthalmology to exclude increased intracranial pressure.  He had a GI work-up that was negative.  He is not endorsing headaches or neck pain but he does have severe trigger point in the right shoulder girdle that could be triggering some nausea potentially.  Advised him to get a trigger point injection for that.  Would be reasonable for him to try Emgality as a migraine prophylactic with very little downside given his intractable symptoms currently.  Follow-up in about 6 weeks  - 03/06/20: eval w Dr. Nilda Calamity: He is due for MRI for meningioma which does border on posterior cingulate cortex which is important in memory function some some concern about clinical effects of tumor progression.  His meningioma seems to be progressive in size over past year in my  estimation although this is not noted on the report.  It is asymptomatic.  I would like to get a repeat scan in 6 months.  Follow-up after testing complete  - MRI 12/11/19: IMP:   Stable posterior parafalcine meningioma with continued segmental occlusion of the superior sagittal sinus.  - 09/02/19: eval w Dr. Nilda Calamity (neurology): He is due for MRI for meningioma which does border on posterior cingulate cortex is important in memory function.  Order for MRI provided if this normal I would rec repeat study in a year.  If abnormal would have him schedule for follow-up to discuss further  - 09/03/18: MRI: [facility]: Modest increase in residual left parafalcine tumor volume noted when compared back to 2018 but wo significant change when compared to 2019.  - 09/06/17: electronic eval w [name] ([facility]): Dr. Rosanne Ashing residual occipital parasagittal meningioma appears stable between 2017 - 2019 though it enlarged a little between 2008 - 2017. It is now calcifying, suggesting that further growth unlikely. I do not think it is responsible for ongoing symptoms. I would suggest follow up MRI in 2 years.         . Chronic back pain 11/09/2015     Priority: Medium     - 07/12/20 pt email: Today I had an appointment with the pain clinic. I have weaned down to 300 mcg Belbuca QD from 450 mcg BID. I have no nausea and this controls the chronic pain. They would prefer that I take 150 mcg BID since the half life is 12 hours and it is always more difficult to catch up with  the pain. I am still trying to wean down to the lowest dose that controls the pain.   - eval w [facility] 12/02/19: Managing with Belbuca which he reports improving pain significantly.  Since initiating Belbuca pain is a 3-4.  He has been able to [place] all other meds including tramadol and gabapentin.  - Eval with [facility] 07/12/17: Rec tramadol for severe breakthrough pain.  MRI of cervical spine ordered.  Continue at-home exercises and  stretches and physical therapy.  Follow-up in 4 weeks for further evaluation  - As of May 2018 pt reports he gets pain medication and klonazepam from [facility]  - 07/01/16: note from Dr. Toni Amend:  I will no longer refill percocet, clonazepam, or provigil as no disease indications. I advised he will need  pain specialist provide. Pt has shown no e/o abuse, but has been on meds for years w no benefit. Pt currently being tx for panhypopit and AI, hypogonadism, and hypot GH deficiency. The pt knows to wean clonazepam and asked for 30 days of tramadol to wean from percocet. Note he is only on a half tablet of percocet qid. No refills will be provided. Pt aware.   - 2005: developed R hip and leg pain and was told he had "small central disk herniation" at L5-S1. Surgery not rec'd. He received PT and some epidural corticosteroid injections which improved but did not completely resolve situation. Low back and leg pain recurred in 2006 and was again tx w steroid and PT.      . Essential hypertension 01/24/2013     Priority: Medium     Treated with amlodipine and ramipril and toprol     . Mixed hyperlipidemia 01/24/2013     Priority: Medium     Pt reports he has taken statin for many years (was taken off to see if this would help with fibromyalgia pain / wo improvement) and zetia and tricor. As of June 2019 not on tricor (concern with zetia and tricor contributing to risk of myopathy)    TC 177 TRI 237 HDL 54 LDL 76 02/14/18  TC 098 TRI 112 HDL 81 LDL 86 09/14/16          . Primary hypothyroidism 01/24/2013     Priority: Medium     Check FT4 in addition to TSH for him    - 07/13/20 pt email: Had apt. with endo - to decrease Levothyroxin to 125 mg and add Cytomel 5 mg. The added T3 may help me get started in morning. Will do this for a month and then check levels. ACTH challenge test to be repeated.    - FT4 wnl as of 11/28/19  - FT4 7.4 wnl as of 09/06/18  - 03/12/18: email from endocrin Dr. Madie Reno at [facility]: " regarding thyroid  replacement dose. TSH not reliable. All you really need in addition is FT4 which he states normal.. You values here in past have been around 1.5. If it is still in this range I would not change dose. Re testosterone, it would be good to get free testosterone or bioavailable testosterone along with total to be sure dose is correct. Regarding GH, if you have only been off of it for a week or two, you can start back on same dose. If it has been longer you should probably start at 0.2 mg/day for 2 weeks then increase to 0.3 mg.        . History of asthma 01/24/2013  Priority: Medium     - since childhood    - eval w Dr. Myles Gip 04/25/2019: Continue current medications asthma under good control Symbicort twice daily plus Singulair  - 11/23/16: eval w Dr. Renita Papa ([facility] at [facility]): bronchial asthma mild, allergic rhinitis postnasal drainage: uses albuterol BID for chest tightness. Check nitric oxide test. He has had intermittent eosinophilia and this points towards an eosinophilic asthma phenotype. He does have allergic rhinitis and think he would be better servied with our triple combination nose spray which includes mometasone, diphenhydramine and iptratroprium. I gave him rx for this.      . Basal cell carcinoma (BCC) of skin of nose 01/29/2018     Priority: Low     Dx in June 2019 recommended for MOHS in July 2019     - eval w Dr. Rickard Patience 01/02/18: s/p biopsy L nasal area     . Spondylosis of cervical region without myelopathy or radiculopathy 08/09/2017     Priority: Low     - MRI C Spine w/o contrast: C2-C3: Small posterior disc bulge partially effacing the ventral CSF and gently indenting the ventral cervical spinal cord.  There is no underlying cord signal abnormality.  There is no central canal nor is there foraminal narrowing.  C3-C4: There is a disc osteophyte complex with a superimposed posterior central disc protrusion which partially effaces the ventral CSF and indents the ventral cervical spinal cord.   There is no central canal stenosis or foraminal narrowing.  C4-C5: There is a disc osteophyte complex with prominent posterior lateral disc component within the foramina bilaterally, right greater than left.  Posterior disc bulge partially effaces the ventral CSF and indents the right ventral cervical spinal cord.  There is mild central canal stenosis.  There is no foraminal narrowing.  C5-C6: There is disc space narrowing and disc desiccation.  There is a disc osteophyte complex with prominent uncovertebral hypertrophy, right greater than left.  Posterior disc bulge partially effaces the ventral CSF.  There is no central canal stenosis.  There is mild to moderate right-sided foraminal encroachment.  The left foramen is patent.  C6-C7: There is disc space narrowing and disc desiccation.  There is a disc osteophyte complex with right posterior lateral disc herniation partially effacing the ventral CSF and mildly flattening.  The underlying cervical spinal cord.  There is marked right-sided foraminal encroachment as well as mild left-sided foraminal narrowing.  There is facet arthropathy.  C7-T1: There is no evidence of central canal or foraminal stenosis.  Overall impression: Multilevel cervical spondylosis as described in detail above.  - evaluation w [facility] Dr. Penni Bombard Nov 2018     . Amnestic MCI (mild cognitive impairment with memory loss) 11/20/2016     Priority: Low     - had dizziness on donepezil and remeron - Onida'd both -     - 04/17/19: eval w Neurology Dr. Nilda Calamity: Multiple medical problems and reduced quality of life due to chronic pain insomnia and fatigue and memory concerns.  He would like to try a new possible tx for chronic fatigue and fibromyalgia involving treatment of possible viral pathogens.  I told him I am not comfortable treating him this way with famciclovir and Celebrex but I rec he see another physician interest in fibromyalgia and chronic fatigue as well as possible viral  connections with conditions.  Referral made.  I will continue to follow for memory concerns and meningioma.  I have no suspicion  for Alzheimer's.  The meningioma does border on posterior cingulate cortex which is important in memory function and will be monitored periodically.  He sees Dr. Theodoro Grist for insomnia and daytime sleepiness.  I will see him again virtually in a few months  - 11/27/18: eval w Neurology Dr. Nilda Calamity: Multiple medical problems and reduced quality of life due to chronic pain insomnia and fatigue with more recent concerns about memory.  At this point based on history and exam as well as review of records I have no reason to suspect Alzheimer's type dementia.  The meningioma is bordering on aspects of the posterior cingulate cortex which is important in memory function so this is potentially a cause.  However I am skeptical of the MCI diagnosis and I would think that abbreviated follow-up testing is important.  He has severe daytime fatigue and sleepiness and unrefreshing sleep.  He is seeing Dr. Theodoro Grist for further advice on this.  I will prescribe Provigil for his daytime fatigue as I intended to previously.  His meningioma needs continual follow-up.  Although there was no obvious growth with the recent exam I did feel that the meningioma seemed a bit more robust and greater in volume than it did in 2017 but this may have been due to the greater degree of gadolinium uptake which was probably a technical issue.  His understanding was that the meningioma was calcified and unlikely to grow.       . Hemianopia, homonymous, left 09/15/2015     Priority: Low     - 09/04/17: eval w Dr. Jerre Simon [facility]): He has a L homonymous hemianopia from prior occipital lobe meningioma and resection. I will get baseline visual field since last exam 2 years ago. He reports dry eye. Prior SSA and SSB were negative. Schirmer's test showed normal tear production. The dry eye is mostly from meibomian gland dysfunction and  therefore unrelated to Sjogren's. His main visual complaint is blurred vision and tunnel vision during "spells" that he attributes to low cortisol. He is currently being tx with cortisone and has minimal sx. A lack of cortisone would not typically cause visual sx, but if blood pressure dropping, this could be contributing. He is scheduled for autonomic testing later this week. Addendum: 24-2 Humphrey visual field showed a small left inferior homonymous scotoma     . Secondary male hypogonadism 01/24/2013     Priority: Low     - - 10/31/16: eval w Endocrine Dr. Tawanna Cooler [facility]): testosterone level is excellent right now - continue current therapy         . Trapezius strain, right, initial encounter 07/09/2020     06/05/20: Based on his clinical presentation and MRI findings a strain of the trapezius is suggested.  I recommended a trial of physical therapy initially rather than doing an MRI of the chest wall.  He is amenable to this.   - Ambulatory referral to Physical Therapy     . Chronic narcotic dependence 06/05/2020   . Meningioma 06/05/2020   . Chronic kidney disease, stage III (moderate) 05/14/2020   . Onychomycosis 02/13/2018   . Xerostomia 12/21/2017     eval w dentist in April 2019 and recommended for tx w evoxac     . Left testicular pain 09/13/2017     09/07/17 evaluation w Urology at [facility]: PLAN: 1 Left Testicular Pain We discussed current symptomatology. We discussed results of ultrasound which does not note concerning pathology to account for left-sided testicular pain. He  has noted improvement with use of scrotal support. I did discuss with him consideration of referral to our PMR colleagues, potential PT for pelvic floor assessment or question as to whether nerve block would be appropriate. At this point in time the patient would like to defer anything invasive and is reassured with negative imaging.             . Recurrent sinusitis 01/17/2017     - 11/21/16: eval w ENT Dr. Jannette Spanner [facility]): For  recurrent sinusitis, he is on good medical regiment w daily nasal irrigations. Surgery would be indicated for 4-5 episodes of sinusitis per year. He was not interested in operative intervention and this is reasonable. In re to his deviated septum septoplasty could be considered. He was not interested at this time. To address his nasal obstruction on a mucosal basis I recommended triple spray. Avoid afrin. Offered him fluoroscopic eval of his dysphagia but he deferred. Would not recommend surgery for OSA.      [name] BPH (benign prostatic hyperplasia) 01/24/2013     As of 11/13/19 pt noting diminished urinary flow - req trial of flomax 0.4 mg qhs -          The patient's past medical history, surgical history, medications and allergies were reviewed.    Allergies   Allergen Reactions   . Bextra [Valdecoxib] Rash       Medications  Current Outpatient Medications   Medication Sig Dispense Refill   . albuterol sulfate HFA (ProAir HFA) 108 (90 Base) MCG/ACT inhaler INHALE ONE PUFF BY MOUTH EVERY FOUR TO SIX HOURS AS NEEDED FOR WHEEZING 25.5 g 1   . AMITRIPTYLINE HCL PO Take 40 mg by mouth nightly        . amLODIPine (NORVASC) 5 MG tablet Take 1 tablet (5 mg total) by mouth 2 (two) times daily 180 tablet 3   . Belbuca 150 MCG per buccal film daily as needed        . clonazePAM (KlonoPIN) 0.5 MG tablet Take 1 tablet (0.5 mg total) by mouth daily as needed for Anxiety 30 tablet 0   . docusate sodium (Colace) 100 MG capsule Take 100 mg by mouth 2 (two) times daily     . ezetimibe (ZETIA) 10 MG tablet Take 1 tablet (10 mg total) by mouth daily 90 tablet 3   . finasteride (PROSCAR) 5 MG tablet Take 1 tablet (5 mg total) by mouth nightly 90 tablet 3   . hydrocortisone (CORTEF) 5 MG tablet Take 15 mg in am, 5 mg in early afternoon and 5 mg at 7 pm. Allow for extra doses in case of stress, illness, fever. 600 tablet 2   . levothyroxine (SYNTHROID) 100 MCG tablet Take 1 tablet (100 mcg total) by mouth every morning 30 tablet 11   .  liothyronine (CYTOMEL) 5 MCG tablet Take 2 tablets (10 mcg total) by mouth daily 60 tablet 4   . metoprolol succinate XL (Toprol XL) 25 MG 24 hr tablet Take 1 tablet (25 mg total) by mouth nightly 90 tablet 3   . montelukast (SINGULAIR) 10 MG tablet TAKE 1 TABLET BY MOUTH EVERY NIGHT AT BEDTIME AS DIRECTED 90 tablet 3   . Motegrity 2 MG Tab Take 1 tablet by mouth daily     . Probiotic Product (ALIGN PO) Take 1 tablet by mouth daily     . psyllium (Metamucil) 0.52 g capsule Take 1.6 g by mouth 2 (two) times daily     .  ramipril (ALTACE) 10 MG capsule Take 1 capsule (10 mg total) by mouth daily 90 capsule 3   . senna (SENOKOT) 8.6 MG tablet Take 1 tablet by mouth 2 (two) times daily        . simvastatin (ZOCOR) 20 MG tablet Take 1 tablet (20 mg total) by mouth nightly 90 tablet 0   . SYMBICORT 80-4.5 MCG/ACT inhaler Inhale 2 puffs into the lungs two times daily (Patient taking differently: 2 puffs 2 (two) times daily Have patient rinse mouth after use  ) 10.2 g 3   . tamsulosin (FLOMAX) 0.4 MG Cap Take 1 capsule (0.4 mg total) by mouth Daily after dinner 90 capsule 3   . Testosterone 20.25 MG/ACT (1.62%) Gel Apply 4 actuations daily (2 on each shoulder) 300 g 2   . valACYclovir HCL (VALTREX) 500 MG tablet Take 1 tablet (500 mg total) by mouth daily 90 tablet 3   . zolpidem (AMBIEN CR) 6.25 MG CR tablet        No current facility-administered medications for this visit.     Review of Systems  CONST: No weight change, no fevers/chills sweats, new or worsening fatigue, muscle aches  CV: No chest pain, palpitations, leg swelling  RESP: No shortness of breath, cough, wheeze, asthma      Physical Exam  BP 136/80 (BP Site: Left arm, Patient Position: Sitting, Cuff Size: Medium)   Pulse 71   Temp 97.7 F (36.5 C) (Temporal)   Resp 15   Wt 84.6 kg (186 lb 9.6 oz)   SpO2 98%   BMI 26.77 kg/m   Wt Readings from Last 3 Encounters:   09/03/20 84.6 kg (186 lb 9.6 oz)   08/10/20 82.1 kg (181 lb)   07/06/20 82.1 kg (181 lb)      GEN: Well developed well nourished male alert and appropriate in no apparent distress    Assessment/Plan    1. Chronic fatigue syndrome with fibromyalgia  May continue Valtrex.  He is aware of potential risks benefits and side effects.     2. Essential hypertension  Controlled monitor chemistry with next blood draw  - Basic Metabolic Panel; Future    3. Mixed hyperlipidemia  Monitor liver functions  - Hepatic function panel (LFT); Future    Orders Placed This Encounter   Procedures   . Basic Metabolic Panel   . Hepatic function panel (LFT)     Gloriajean Dell, MD  [facility] [address]  [address]  [place], [place] [address]  P) [phone]  F) [phone]  [email or web address]

## 2020-09-18 ENCOUNTER — Encounter: Payer: Self-pay | Admitting: Internal Medicine

## 2020-09-18 ENCOUNTER — Other Ambulatory Visit: Payer: Self-pay | Admitting: Internal Medicine

## 2020-09-18 ENCOUNTER — Ambulatory Visit (FREE_STANDING_LABORATORY_FACILITY): Payer: Medicare Other

## 2020-09-18 DIAGNOSIS — E039 Hypothyroidism, unspecified: Secondary | ICD-10-CM

## 2020-09-18 DIAGNOSIS — R7309 Other abnormal glucose: Secondary | ICD-10-CM

## 2020-09-18 DIAGNOSIS — E782 Mixed hyperlipidemia: Secondary | ICD-10-CM

## 2020-09-18 DIAGNOSIS — I10 Essential (primary) hypertension: Secondary | ICD-10-CM

## 2020-09-18 LAB — CBC
Absolute NRBC: 0.04 10*3/uL — ABNORMAL HIGH (ref 0.00–0.00)
Hematocrit: 43.1 % (ref 37.6–49.6)
Hgb: 14.2 g/dL (ref 12.5–17.1)
MCH: 30.4 pg (ref 25.1–33.5)
MCHC: 32.9 g/dL (ref 31.5–35.8)
MCV: 92.3 fL (ref 78.0–96.0)
MPV: 11.4 fL (ref 8.9–12.5)
Nucleated RBC: 0.4 /100 WBC — ABNORMAL HIGH (ref 0.0–0.0)
Platelets: 266 10*3/uL (ref 142–346)
RBC: 4.67 10*6/uL (ref 4.20–5.90)
RDW: 13 % (ref 11–15)
WBC: 10.42 10*3/uL — ABNORMAL HIGH (ref 3.10–9.50)

## 2020-09-18 LAB — HEMOGLOBIN A1C
Average Estimated Glucose: 119.8 mg/dL
Hemoglobin A1C: 5.8 % (ref 4.6–5.9)

## 2020-09-18 NOTE — Progress Notes (Signed)
Blood Draw:  . Fasting? = no  . Location of stick: right AC using aseptic technique.  . Outcome: Patient tolerated well without any complications. Attempt # 1.    . Collected:   o 1 Gold top tube  o 1 Tiger top tube  o 1 Lavender EDTA top tubes..    . Lab/Temperature: Refrigerated     Tiger tube sent to [facility] and lavender sent to [facility]

## 2020-09-18 NOTE — Progress Notes (Signed)
[facility]  -[phone].  Fax [phone] to prescribe mometasone 0.033% ipratropium 0.02% diphenhydramine 0.033% nasal spray use 2 sprays in each nostril twice daily

## 2020-09-19 ENCOUNTER — Encounter: Payer: Self-pay | Admitting: Internal Medicine

## 2020-09-19 DIAGNOSIS — R7401 Elevation of levels of liver transaminase levels: Secondary | ICD-10-CM | POA: Insufficient documentation

## 2020-09-19 LAB — BASIC METABOLIC PANEL
African American eGFR: 84 mL/min/{1.73_m2} (ref >59)
BUN / Creatinine Ratio: 19 (ref 10–24)
BUN: 20 mg/dL (ref 8–27)
CO2: 22 mmol/L (ref 20–29)
Calcium: 9.4 mg/dL (ref 8.6–10.2)
Chloride: 100 mmol/L (ref 96–106)
Creatinine: 1.03 mg/dL (ref 0.76–1.27)
Sodium: 138 mmol/L (ref 134–144)
non-African American eGFR: 73 mL/min/{1.73_m2} (ref >59)

## 2020-09-19 LAB — HEPATIC FUNCTION PANEL
ALT: 53 IU/L — ABNORMAL HIGH (ref 0–44)
AST (SGOT): 40 IU/L (ref 0–40)
Albumin: 4.2 g/dL (ref 3.7–4.7)
Alkaline Phosphatase: 66 IU/L (ref 44–121)
Bilirubin Direct: 0.2 mg/dL (ref 0.00–0.40)
Bilirubin, Total: 0.8 mg/dL (ref 0.0–1.2)
Protein, Total: 6.4 g/dL (ref 6.0–8.5)

## 2020-09-21 ENCOUNTER — Telehealth: Payer: Self-pay

## 2020-09-21 DIAGNOSIS — R7309 Other abnormal glucose: Secondary | ICD-10-CM

## 2020-09-21 DIAGNOSIS — I10 Essential (primary) hypertension: Secondary | ICD-10-CM

## 2020-09-21 DIAGNOSIS — E039 Hypothyroidism, unspecified: Secondary | ICD-10-CM

## 2020-09-21 NOTE — Telephone Encounter (Signed)
Received call from Dina at [facility] that the Chemistry panel and Thyroid tests were rejected as the sample was sent to the lab unspun. The speciemens will need to be recollected.     --  Called and spoke with patient. Scheduled to have labs redrawn on 2/1.

## 2020-09-22 ENCOUNTER — Ambulatory Visit (FREE_STANDING_LABORATORY_FACILITY): Payer: Medicare Other

## 2020-09-22 DIAGNOSIS — E039 Hypothyroidism, unspecified: Secondary | ICD-10-CM

## 2020-09-22 DIAGNOSIS — I10 Essential (primary) hypertension: Secondary | ICD-10-CM

## 2020-09-22 DIAGNOSIS — R7309 Other abnormal glucose: Secondary | ICD-10-CM

## 2020-09-22 LAB — HEMOLYSIS INDEX: Hemolysis Index: 14 (ref 0–24)

## 2020-09-22 LAB — BASIC METABOLIC PANEL
Anion Gap: 9 (ref 5.0–15.0)
BUN: 21 mg/dL (ref 9.0–28.0)
CO2: 25 mEq/L (ref 21–29)
Calcium: 9.7 mg/dL (ref 7.9–10.2)
Chloride: 104 mEq/L (ref 100–111)
Creatinine: 1.2 mg/dL (ref 0.5–1.5)
Glucose: 119 mg/dL — ABNORMAL HIGH (ref 70–100)
Potassium: 4.6 mEq/L (ref 3.5–5.1)
Sodium: 138 mEq/L (ref 136–145)

## 2020-09-22 LAB — T3, FREE: T3, Free: 3.15 pg/mL (ref 1.71–3.71)

## 2020-09-22 LAB — GFR: EGFR: 59.6

## 2020-09-22 LAB — T4, FREE: T4 Free: 0.92 ng/dL (ref 0.70–1.48)

## 2020-09-22 LAB — TSH: TSH: 0.27 u[IU]/mL — ABNORMAL LOW (ref 0.35–4.94)

## 2020-09-22 NOTE — Addendum Note (Signed)
Addended by: Doyce Loose on: 09/22/2020 12:50 PM     Modules accepted: Orders

## 2020-09-22 NOTE — Progress Notes (Signed)
Blood Draw:  . Fasting? = No  . Location of stick: AC right using aseptic technique.  . Outcome: Patient tolerated well without any complications. Attempt # 1.  . Collected:   o 1 Gold top tube.    Lab/Temperature: ICL/Refrigerated

## 2020-10-08 ENCOUNTER — Telehealth (INDEPENDENT_AMBULATORY_CARE_PROVIDER_SITE_OTHER): Payer: Medicare Other | Admitting: Endocrinology, Diabetes and Metabolism

## 2020-10-08 DIAGNOSIS — E039 Hypothyroidism, unspecified: Secondary | ICD-10-CM

## 2020-10-08 DIAGNOSIS — E2749 Other adrenocortical insufficiency: Secondary | ICD-10-CM

## 2020-10-08 NOTE — Progress Notes (Signed)
Spoke with patient. He forgot about today's visit so we will reschedule

## 2020-10-19 ENCOUNTER — Other Ambulatory Visit: Payer: Self-pay | Admitting: Internal Medicine

## 2020-10-19 DIAGNOSIS — E782 Mixed hyperlipidemia: Secondary | ICD-10-CM

## 2020-10-19 MED ORDER — EZETIMIBE 10 MG PO TABS
10.0000 mg | ORAL_TABLET | Freq: Every day | ORAL | 3 refills | Status: DC
Start: 2020-10-19 — End: 2020-10-21

## 2020-10-21 ENCOUNTER — Other Ambulatory Visit: Payer: Self-pay

## 2020-10-21 DIAGNOSIS — E782 Mixed hyperlipidemia: Secondary | ICD-10-CM

## 2020-10-21 MED ORDER — EZETIMIBE 10 MG PO TABS
10.0000 mg | ORAL_TABLET | Freq: Every day | ORAL | 3 refills | Status: DC
Start: 2020-10-21 — End: 2021-10-14

## 2020-10-27 ENCOUNTER — Encounter (HOSPITAL_BASED_OUTPATIENT_CLINIC_OR_DEPARTMENT_OTHER): Payer: Self-pay | Admitting: Endocrinology, Diabetes and Metabolism

## 2020-10-27 ENCOUNTER — Telehealth (HOSPITAL_BASED_OUTPATIENT_CLINIC_OR_DEPARTMENT_OTHER): Payer: Medicare Other | Admitting: Endocrinology, Diabetes and Metabolism

## 2020-10-27 DIAGNOSIS — E039 Hypothyroidism, unspecified: Secondary | ICD-10-CM

## 2020-10-27 MED ORDER — LIOTHYRONINE SODIUM 5 MCG PO TABS
15.0000 ug | ORAL_TABLET | Freq: Every day | ORAL | 4 refills | Status: DC
Start: 2020-10-27 — End: 2020-12-09

## 2020-10-27 MED ORDER — LEVOTHYROXINE SODIUM 75 MCG PO TABS
75.0000 ug | ORAL_TABLET | Freq: Every morning | ORAL | 5 refills | Status: DC
Start: 2020-10-27 — End: 2020-12-09

## 2020-10-27 NOTE — Progress Notes (Signed)
Patient was not available for the visit so we decided to postpone it to a different date

## 2020-11-03 ENCOUNTER — Other Ambulatory Visit: Payer: Self-pay

## 2020-11-03 MED ORDER — SIMVASTATIN 20 MG PO TABS
20.0000 mg | ORAL_TABLET | Freq: Every evening | ORAL | 3 refills | Status: DC
Start: 2020-11-03 — End: 2020-11-27

## 2020-11-19 ENCOUNTER — Telehealth: Payer: Self-pay | Admitting: Internal Medicine

## 2020-11-19 ENCOUNTER — Emergency Department
Admission: EM | Admit: 2020-11-19 | Discharge: 2020-11-20 | Disposition: A | Discharge status: Home / Self Care | Payer: Medicare Other | Attending: Student in an Organized Health Care Education/Training Program | Admitting: Student in an Organized Health Care Education/Training Program

## 2020-11-19 ENCOUNTER — Emergency Department: Payer: Medicare Other

## 2020-11-19 DIAGNOSIS — Z20822 Contact with and (suspected) exposure to covid-19: Secondary | ICD-10-CM | POA: Insufficient documentation

## 2020-11-19 DIAGNOSIS — R110 Nausea: Secondary | ICD-10-CM | POA: Insufficient documentation

## 2020-11-19 DIAGNOSIS — R0602 Shortness of breath: Secondary | ICD-10-CM | POA: Insufficient documentation

## 2020-11-19 DIAGNOSIS — R079 Chest pain, unspecified: Secondary | ICD-10-CM

## 2020-11-19 LAB — CBC AND DIFFERENTIAL
Absolute NRBC: 0 10*3/uL (ref 0.00–0.00)
Basophils Absolute Automated: 0.08 10*3/uL (ref 0.00–0.08)
Basophils Automated: 0.6 %
Eosinophils Absolute Automated: 0.07 10*3/uL (ref 0.00–0.44)
Eosinophils Automated: 0.6 %
Hematocrit: 43.3 % (ref 37.6–49.6)
Hgb: 14.8 g/dL (ref 12.5–17.1)
Immature Granulocytes Absolute: 0.09 10*3/uL — ABNORMAL HIGH (ref 0.00–0.07)
Immature Granulocytes: 0.7 %
Lymphocytes Absolute Automated: 2.82 10*3/uL (ref 0.42–3.22)
Lymphocytes Automated: 22.8 %
MCH: 30.6 pg (ref 25.1–33.5)
MCHC: 34.2 g/dL (ref 31.5–35.8)
MCV: 89.5 fL (ref 78.0–96.0)
MPV: 10.6 fL (ref 8.9–12.5)
Monocytes Absolute Automated: 1.2 10*3/uL — ABNORMAL HIGH (ref 0.21–0.85)
Monocytes: 9.7 %
Neutrophils Absolute: 8.12 10*3/uL — ABNORMAL HIGH (ref 1.10–6.33)
Neutrophils: 65.6 %
Nucleated RBC: 0 /100 WBC (ref 0.0–0.0)
Platelets: 278 10*3/uL (ref 142–346)
RBC: 4.84 10*6/uL (ref 4.20–5.90)
RDW: 13 % (ref 11–15)
WBC: 12.38 10*3/uL — ABNORMAL HIGH (ref 3.10–9.50)

## 2020-11-19 LAB — B-TYPE NATRIURETIC PEPTIDE: B-Natriuretic Peptide: 12 pg/mL (ref 0–100)

## 2020-11-19 LAB — COVID-19 (SARS-COV-2) & INFLUENZA  A/B, NAA (ROCHE LIAT)
Influenza A: NOT DETECTED
Influenza B: NOT DETECTED
SARS CoV 2 Overall Result: NOT DETECTED

## 2020-11-19 LAB — COMPREHENSIVE METABOLIC PANEL
ALT: 55 U/L (ref 0–55)
AST (SGOT): 34 U/L (ref 5–34)
Albumin/Globulin Ratio: 1.6 (ref 0.9–2.2)
Albumin: 4.1 g/dL (ref 3.5–5.0)
Alkaline Phosphatase: 68 U/L (ref 38–106)
Anion Gap: 13 (ref 5.0–15.0)
BUN: 22 mg/dL (ref 9–28)
Bilirubin, Total: 1.6 mg/dL — ABNORMAL HIGH (ref 0.2–1.2)
CO2: 21 mEq/L — ABNORMAL LOW (ref 22–29)
Calcium: 9.3 mg/dL (ref 7.9–10.2)
Chloride: 107 mEq/L (ref 100–111)
Creatinine: 1.3 mg/dL (ref 0.7–1.3)
Globulin: 2.5 g/dL (ref 2.0–3.6)
Glucose: 107 mg/dL — ABNORMAL HIGH (ref 70–100)
Potassium: 3.8 mEq/L (ref 3.5–5.1)
Protein, Total: 6.6 g/dL (ref 6.0–8.3)
Sodium: 141 mEq/L (ref 136–145)

## 2020-11-19 LAB — IHS D-DIMER: D-Dimer: <0.22 ug/mL FEU (ref 0.00–0.60)

## 2020-11-19 LAB — GFR: EGFR: 54.3

## 2020-11-19 LAB — TROPONIN I: Troponin I: 0.01 ng/mL (ref 0.00–0.05)

## 2020-11-19 MED ORDER — PROMETHAZINE HCL 12.5 MG RE SUPP
25.0000 mg | Freq: Once | RECTAL | Status: AC
Start: 2020-11-19 — End: 2020-11-19
  Administered 2020-11-19: 25 mg via RECTAL
  Filled 2020-11-19: qty 2

## 2020-11-19 MED ORDER — HYDROCORTISONE SOD SUC (PF) 100 MG IJ SOLR (WRAP)
25.0000 mg | Freq: Once | INTRAMUSCULAR | Status: AC
Start: 2020-11-19 — End: 2020-11-19
  Administered 2020-11-19: 25 mg via INTRAVENOUS
  Filled 2020-11-19: qty 2

## 2020-11-19 MED ORDER — SODIUM CHLORIDE 0.9 % IV BOLUS
1000.0000 mL | Freq: Once | INTRAVENOUS | Status: AC
Start: 2020-11-19 — End: 2020-11-20
  Administered 2020-11-19: 1000 mL via INTRAVENOUS

## 2020-11-19 NOTE — Telephone Encounter (Signed)
I called Dr. [name] back after his call to a nurse. I spoke with him at 1655 hrs. He notes a week or two of progressively worsening dyspnea associated with tachycardia. He reports HR at 110 or higher even at rest. He thinks it is sinus tach based on his pulse appearing regular. He has dramatic fatigue and SOB nausea without vomiting and has had very low level activity the last week or two. Denies fever diarrhea or change in stool. Denies chest pain. Has lost > 6 lbs in the last week. No cough and is vaccinated. He is unclear if this is another one of his "flares" of nausea and fatigue he has had in past that often respond to steroid burst treatment. Advised that if he is having progressive worsening SOB and tachycardia the differential here is broad and I would recommend he proceed to [facility] ED for evaluation, EKG, blood work to determine cause of this. His wife is away in [place] helping his daughter and he does not want to leave his dogs home alone - thinks he will call his daughter in [place] to assist the dogs and intends to go to [facility] ED. Called report to [facility] ED.

## 2020-11-19 NOTE — EDIE (Signed)
COLLECTIVE?NOTIFICATION?11/19/2020 20:16?[name], [name]?MRN: [id number]    Criteria Met      5 ED Visits in 12 Months    Security and Safety  No recent Security Events currently on file    ED Care Guidelines  There are currently no ED Care Guidelines for this patient. Please check your facility's medical records system.        Prescription Monitoring Program  461??- Narcotic Use Score  442??- Sedative Use Score  060??- Stimulant Use Score  260??- Overdose Risk Score  - All Scores range from 000-999 with 75% of the population scoring < 200 and on 1% scoring above 650  - The last digit of the narcotic, sedative, and stimulant score indicates the number of active prescriptions of that type  - Higher Use scores correlate with increased prescribers, pharmacies, mg equiv, and overlapping prescriptions  - Higher Overdose Risk Scores correlate with increased risk of unintentional overdose death   Concerning or unexpectedly high scores should prompt a review of the PMP record; this does not constitute checking PMP for prescribing purposes.      E.D. Visit Count (12 mo.)  Facility Visits   [place] [facility] [place] 1   [place] [facility] 4   Total 5   Note: Visits indicate total known visits.     Recent Emergency Department Visit Summary  Date Facility [facility] Type Diagnoses or Chief Complaint   Nov 19, 2020 [facility] [place] [facility]      Shortness of Breath      May 11, 2020 [facility] [place]      N/V; weight loss;brain tumor      Nausea      Weight Loss      Apr 28, 2020 [facility] [place] [facility]      nausea vomiitng      Nausea      Apr 09, 2020 [facility]      vomiting      Emesis      Nausea      Personal history of benign neoplasm of the brain      Personal history of other endocrine, nutritional and metabolic disease      Cyclical vomiting syndrome unrelated to migraine      Dec 19, 2019 [place] [place]: [place]  [place]  [facility]gency      nausea and vomiting has a brain tumor not able to eat          Recent Inpatient Visit Summary  No recorded inpatient visits.     Care Team  Provider Specialty Phone Fax Service Dates   BRODERICK, Pearletha Furl, MD Internal Medicine   Current      Collective Portal  This patient has registered at the [facility] Emergency Department   For more information visit: [email or web address]     PLEASE NOTE:     1.   Any care recommendations and other clinical information are provided as guidelines or for historical purposes only, and providers should exercise their own clinical judgment when providing care.    2.   You may only use this information for purposes of treatment, payment or health care operations activities, and subject to the limitations of applicable Collective Policies.    3.   You should consult directly with the organization that provided a care guideline or other clinical history with any questions about additional information or accuracy  or completeness of information provided.    ? 2022 [employer] - https://craig.com/

## 2020-11-19 NOTE — Telephone Encounter (Addendum)
Patient telephone c/o SOB with and without exertion    Tachycardiac, slightly elevated BP    Patient is home alone.    I suggested he call 911 and go to the ER but he does not want to do this.    Trying to reach Dr. Orlin Hilding by phone.    BRODERICK NOTE called patient back at 1645 hrs did not reach him and left message that if he having SOB or tachycardia that is severe or unrelenting he should call 911 proceed to ED or contact me or on call physician to discuss.

## 2020-11-19 NOTE — ED Provider Notes (Signed)
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 male ho AR, asthma, kidney stone, BPH, DJD, HTN, HLD, hypothyroidism, fibromyalgia co persistent shortness of breath and palpitations (HR 100's) x 2 weeks. Pt states increased SOB with exertion.  Pt denies chest pain, fevers, cough.  Pt states chronic nausea.  Pt has been using phenergan suppositories with some relief.  Pt denies recent travel.  No hx of similar sx. Pt states in the last 2 weeks, decrease in levothyroxine and increase in liothyronine.  Pt stopped using Belbuca 3 days ago.     PMD:  Dr. Orlin Hilding  Cardiology: Dr. Tami Ribas    Past Medical History     Past Medical History:   Diagnosis Date   . Aortic regurgitation    . Ascending aortic aneurysm 09/19/2018    4 cm ECHO   . Asthma    . Avascular necrosis 2008    R hip - did not require surgery   . Bilateral cataracts     budding   . Brain tumor 2003    Taken out in 2003, has returned    . Calculus of kidney    . Cold intolerance    . Color blindness    . Disorder of prostate     BPH   . DJD (degenerative joint disease)     Lumbar / Sacral   . Dry eyes     uses restasis eyedrops which helps (RF, CCP, CRP and ESR have been wnl in past)   . Dysautonomia    . Eczema 03/29/2016    Right thumb    . Encounter for blood transfusion 2004   . Encounter for hepatitis C screening test for low risk patient 08/2016    negative   . Encounter for pharmacogenetic testing 10/2015    MEDIMAP   . Fibromyalgia    . Gastroesophageal reflux disease    . Hemorrhoids    . Hyperlipidemia    . Hypertension    . Hypothyroidism    . Lateral epicondylitis     Left   . Malignant neoplasm of skin    . Mitral valve insufficiency    . Osteoporosis    . Peripheral neuropathy 12/26/2019   . Screening PSA (prostate specific antigen)  02/14/2018    0.4   . Sleep apnea     mild, no cpap   . Tinnitus     high pitched buzzing comes and goes - started after craniotomy for meningioma       Past Surgical History     Past Surgical History:   Procedure Laterality Date   . COLONOSCOPY  2008   . ECHOCARDIOGRAM, TRANSTHORACIC  04/29/2005    63   . ECHOCARDIOGRAM, TRANSTHORACIC  10/17/2016    63   . EGD, COLONOSCOPY N/A 04/16/2019    Procedure: EGD, COLONOSCOPY;  Surgeon: Leatha Gilding, MD;  Location: [facility];  Service: Gastroenterology;  Laterality: N/A;  egd, colonoscopy  q1-unk, md req 45 min   . EXCISION, LESION  12/03/2008    benign lesion   . ORCHIOPEXY Left 1980    for intermittent torsion   . Subtotal resection of posterior sagittal meningioma  12/05/2002    Partial brain resection secondary to meningioma  at [facility]   . TONSILLECTOMY AND ADENOIDECTOMY  as a child   . TOOTH EXTRACTION Right 06/2018       Family History     Family History   Problem Relation Age of Onset   . Asthma Daughter    . Tuberculosis Mother    . Dementia Mother    . Migraines Sister        Social History     Social History     Occupational History   . Not on file   Tobacco Use   . Smoking status: Never Smoker   . Smokeless tobacco: Never Used   Vaping Use   . Vaping Use: Never used   Substance and Sexual Activity   . Alcohol use: Yes     Comment: Rarely   . Drug use: Not Currently     Types: Marijuana   . Sexual activity: Not Currently     Partners: Female     Birth control/protection: None        Review of Systems     Review of Systems   Constitutional: Negative for fever and chills.   HENT: Negative for sore throat.    Respiratory: Negative for cough and Positive for shortness of breath.    Cardiovascular: Negative for chest pain.   Gastrointestinal: Negative for vomiting, abdominal pain and diarrhea. Positive for nausea.  Genitourinary: Negative for dysuria.   Neurological: Negative for headaches.  Skin: Negative for rash   All other systems reviewed and are  negative.    Physical Exam   BP 132/74   Pulse 67   Temp 97.9 F (36.6 C) (Oral)   Resp 16   Wt 81.5 kg   SpO2 96%   BMI 25.78 kg/m     Physical Exam   Constitutional: Patient is oriented to person, place, and time. Patient appears well-developed and well-nourished. No distress.   HENT:   Head: Normocephalic and atraumatic.   Right Ear: External ear normal.   Left Ear: External ear normal.   Nose: Nose normal.   Mouth/Throat: Oropharynx is clear and moist.   Eyes: Conjunctivae and EOM are normal.   Neck: Neck supple. No tracheal deviation present.   Cardiovascular: Normal rate, regular rhythm and normal heart sounds.  Exam reveals no gallop and no friction rub.  No murmur heard.  Pulmonary/Chest: No respiratory distress. Patient has no wheezes. Patient has no rales.   Abdominal: Soft. Patient exhibits no distension. There is no tenderness. There is no rebound and no guarding.   Musculoskeletal: Normal range of motion. Patient exhibits trace pitting edema BLE.   Neurological: Patient is alert and oriented to person, place, and time. No gross cranial nerve deficit.   Skin: Skin is warm. No rash noted.   Psychiatric: Patient has a normal mood and affect. Patient's behavior is normal.   Nursing note and vitals reviewed.    ED Medications Administered     ED Medication Orders (From admission, onward)    Start Ordered     Status Ordering Provider    11/19/20 2248 11/19/20 2247  hydrocortisone sodium succinate (Solu-CORTEF) injection 25 mg  Once        Route: Intravenous  Ordered Dose: 25 mg     Last MAR action: Given Rosamaria Lints    11/19/20 2237 11/19/20 2236  promethazine (PHENERGAN) suppository 25 mg  Once        Route: Rectal  Ordered Dose: 25 mg     Last [facility]  action: Given Rosamaria Lints    11/19/20 2222 11/19/20 2221  sodium chloride 0.9 % bolus 1,000 mL  Once        Route: Intravenous  Ordered Dose: 1,000 mL     Last MAR action: Stopped Rally Ouch J          Orders Placed During This Encounter      Orders Placed This Encounter   Procedures   . COVID-19 (SARS-CoV-2) and Influenza A/B, NAA (Liat Rapid) - MAB candidates only   . Chest AP Portable   . CBC and differential   . Comprehensive metabolic panel   . Troponin I   . GFR   . D-Dimer   . B-type Natriuretic Peptide   . TSH   . ECG 12 lead   . Saline lock IV       Diagnostic Study Results     The results of the diagnostic studies below were reviewed by the ED provider:    Labs  Results     Procedure Component Value Units Date/Time    TSH [[id number]]  (Abnormal) Collected: 11/19/20 2208     Updated: 11/20/20 0055     TSH 0.16 uIU/mL     B-type Natriuretic Peptide [[id number]] Collected: 11/19/20 2208     Updated: 11/19/20 2307     B-Natriuretic Peptide 12 pg/mL     COVID-19 (SARS-CoV-2) and Influenza A/B, NAA (Liat Rapid) - MAB candidates only [[id number]] Collected: 11/19/20 2231    Specimen: Culturette from Nasopharyngeal Updated: 11/19/20 2258     Purpose of COVID testing Diagnostic -PUI     SARS-CoV-2 Specimen Source Nasal Swab     SARS CoV 2 Overall Result Not Detected     Influenza A Not Detected     Influenza B Not Detected    Narrative:      o Collect and clearly label specimen type:  o PREFERRED-Upper respiratory specimen: One Nasal Swab in  Transport Media.  o Hand deliver to laboratory ASAP  Diagnostic -PUI    Troponin I [[id number]] Collected: 11/19/20 2208    Specimen: Blood Updated: 11/19/20 2250     Troponin I 0.01 ng/mL     D-Dimer [[id number]] Collected: 11/19/20 2231     Updated: 11/19/20 2249     D-Dimer <0.22 ug/mL FEU     Comprehensive metabolic panel [[id number]]  (Abnormal) Collected: 11/19/20 2208    Specimen: Blood Updated: 11/19/20 2244     Glucose 107 mg/dL      BUN 22 mg/dL      Creatinine 1.3 mg/dL      Sodium 536 mEq/L      Potassium 3.8 mEq/L      Chloride 107 mEq/L      CO2 21 mEq/L      Calcium 9.3 mg/dL      Protein, Total 6.6 g/dL      Albumin 4.1 g/dL      AST (SGOT) 34 U/L      ALT 55 U/L      Alkaline Phosphatase 68 U/L       Bilirubin, Total 1.6 mg/dL      Globulin 2.5 g/dL      Albumin/Globulin Ratio 1.6     Anion Gap 13.0    GFR [[id number]] Collected: 11/19/20 2208     Updated: 11/19/20 2244     EGFR 54.3    CBC and differential [[id number]]  (Abnormal) Collected: 11/19/20 2208    Specimen: Blood Updated: 11/19/20 2234  WBC 12.38 x10 3/uL      Hgb 14.8 g/dL      Hematocrit 60.4 %      Platelets 278 x10 3/uL      RBC 4.84 x10 6/uL      MCV 89.5 fL      MCH 30.6 pg      MCHC 34.2 g/dL      RDW 13 %      MPV 10.6 fL      Neutrophils 65.6 %      Lymphocytes Automated 22.8 %      Monocytes 9.7 %      Eosinophils Automated 0.6 %      Basophils Automated 0.6 %      Immature Granulocytes 0.7 %      Nucleated RBC 0.0 /100 WBC      Neutrophils Absolute 8.12 x10 3/uL      Lymphocytes Absolute Automated 2.82 x10 3/uL      Monocytes Absolute Automated 1.20 x10 3/uL      Eosinophils Absolute Automated 0.07 x10 3/uL      Basophils Absolute Automated 0.08 x10 3/uL      Immature Granulocytes Absolute 0.09 x10 3/uL      Absolute NRBC 0.00 x10 3/uL           Radiologic Studies  Radiology Results (24 Hour)     Procedure Component Value Units Date/Time    Chest AP Portable [[id number]] Collected: 11/19/20 2156    Order Status: Completed Updated: 11/19/20 2200    Narrative:      Clinical History: Chest pain.    Comparison: 03/25/2020.    Technique: AP view of the chest.     Findings: Slight tortuosity of the thoracic aorta. Cardiac silhouette is  within normal limits for AP technique. Pulmonary vascularity is normal.     No focal consolidation. New mild elevation of the left hemidiaphragm  mild left perihilar linear atelectasis.    Mild rightward curvature and multilevel degenerative changes in the  spine.      Impression:       New mild elevation of left hemidiaphragm and mild left  perihilar atelectasis.    Darra Lis, MD   11/19/2020 9:57 PM        Monitors, EKG, Critical Care, Vitals and Splints   Cardiac Monitor (interpreted by ED physician):     EKG (interpreted by ED physician): NSR 90, no acute ST-T abnl  Critical Care:   Splint check:      MDM and Clinical Notes     MDM:  Eval for PE vs ACS/MI vs cardiac dysrhythmia vs electrolyte abnl vs anemia vs structural abnl.  Labs, meds, XR    12:35AM  Pt reports feeling improved.  Extensive discussion with pt regarding results. Comfortable with d/c to home.  Return precautions discussed.  Will follow up with PMD, cardiology and pulmonology.    Diagnosis and Disposition     Clinical Impression  1. Shortness of breath    2. Nausea          Disposition  ED Disposition     ED Disposition   Discharge    Condition   --    Date/Time   Fri Nov 20, 2020 12:36 AM    Comment   Jossue Enid Derry, MD discharge to home/self care.    Condition at disposition: Stable                 Prescriptions  Discharge Prescriptions     None               Rosamaria Lints, MD  11/20/20 [phone]

## 2020-11-20 ENCOUNTER — Encounter (INDEPENDENT_AMBULATORY_CARE_PROVIDER_SITE_OTHER): Payer: Self-pay | Admitting: Cardiology

## 2020-11-20 ENCOUNTER — Encounter: Payer: Self-pay | Admitting: Internal Medicine

## 2020-11-20 LAB — ECG 12-LEAD
Atrial Rate: 90 {beats}/min
P Axis: 34 degrees
P-R Interval: 162 ms
Q-T Interval: 374 ms
QRS Duration: 102 ms
QTC Calculation (Bezet): 457 ms
R Axis: -31 degrees
T Axis: 39 degrees
Ventricular Rate: 90 {beats}/min

## 2020-11-20 LAB — T3, FREE: T3, Free: 2.57 pg/mL (ref 1.71–3.71)

## 2020-11-20 LAB — TSH: TSH: 0.16 u[IU]/mL — ABNORMAL LOW (ref 0.35–4.94)

## 2020-11-20 LAB — T4, FREE: T4 Free: 0.87 ng/dL (ref 0.70–1.48)

## 2020-11-20 NOTE — Discharge Instructions (Signed)
Dear Shannon [name] Enid Derry, MD:    Shannon [name] were evaluated in the Emergency Department for shortness of breath.  Your labs show no acute abnormalities.  Your chest xray shows some mild left hemidiaphragm elevation.    The examination and treatment you have received today has been on an emergency basis only and has not been intended to substitute for complete medical care.  Since it is impossible to recognize and treat all elements of your injury or illness in a single visit, for your protection in preventing possible complications, if you are not clearly improving in 1-2 days, you should be seen for a re-check at the ER or with your primary care physician.        Instructions:    Continue home medications as prescribed.    Return to the ER for fevers, severe pain, intractable vomiting, chest pain, shortness of breath, worsening of conditions or any concerns.    Follow up with your cardiologist and pulmonology.      Thank you for choosing [facility] for your medical care.    Sincerely,  Rosamaria Lints, MD  [facility]    ________________________________________________________________    Thank you for choosing [facility] for your emergency care needs.  We strive to provide EXCELLENT care to you and your family.       IF YOU DO NOT CONTINUE TO IMPROVE OR YOUR CONDITION WORSENS, PLEASE CONTACT YOUR DOCTOR OR RETURN IMMEDIATELY TO THE EMERGENCY DEPARTMENT.     ONSITE PHARMACY  Our full service onsite pharmacy is a 2 minute walk from the ER.  Open Mon to Fri from 8 am to 8 pm, Sat  9 am to 5 pm. Ask an ED staff member for directions.  We accept all major insurances and prices are competitive with major retailers.  Ask your provider to print your prescriptions down to the pharmacy to speed you on your way home.     OBTAINING A PRIMARY CARE APPOINTMENT     Primary care physicians (PCPs, also known as primary care doctors) are either internists or family medicine doctors. Both  types of PCPs focus on health promotion, disease prevention, patient education and counseling, and treatment of acute and chronic medical conditions.     Call for an appointment with a primary care doctor.  Ask to see who is taking new patients. Two options are below.     Both groups have offices near [place] and in the [place] area.     [facility]  telephone:  [phone]  [email or web address]     [facility]  telephone:  [phone]  [email or web address]        DOCTOR REFERRALS  Call [phone] (available 24 hours a day, 7 days a week) if you need any further referrals and we can help you find a primary care doctor or specialist.  Also, available online at:  [email or web address]        YOUR CONTACT INFORMATION  Before leaving please check with registration to make sure we have an up-to-date contact number.  You can call registration at [phone] to update your information.  For questions about your hospital bill, please call [phone].  For questions about your Emergency Dept Physician bill please call [phone].       FREE HEALTH SERVICES  If you need help with health or social services, please call 2-1-1 for a free referral to resources in your area.  2-1-1 is a free service connecting people with information on health insurance, free clinics, pregnancy, mental health, dental care, food assistance, housing, and substance abuse counseling.  Also, available online at:  http://www.[place].org     MEDICAL RECORDS AND TESTS  Certain laboratory test results do not come back the same day, for example urine cultures.   We will contact you if other important findings are noted.  Radiology films are often reviewed again to ensure accuracy.  If there is any discrepancy, we will notify you.       Please call [phone] to pick up a complimentary CD of any radiology studies performed.  If you or your doctor would like to request a copy of your  medical records, please call [phone].       ORTHOPEDIC INJURY   Please know that significant injuries can exist even when an initial x-[place] is read as normal or negative.  This can occur because some fractures (broken bones) are not initially visible on x-rays.  For this reason, close outpatient follow-up with your primary care doctor or bone specialist (orthopedist) is required.     MEDICATIONS AND FOLLOWUP  Please be aware that some prescription medications can cause drowsiness.  Use caution when driving or operating machinery.     The examination and treatment you have received in our Emergency Department is provided on an emergency basis, and is not intended to be a substitute for your primary care physician.  It is important that your doctor checks you again and that you report any new or remaining problems at that time.       [place], [place], [place] [address] (1.4 miles, 7 minutes)  [place], [place], [place] [address] (6.5 miles, 13 minutes)  Handout with directions available on request           [place])  Call to start or finish an application, compare plans, enroll or ask a question.  [place]: [phone]  Web:  Healthcare.gov     Help Enrolling in [place]  680-433-8595 (TOLL-FREE)  228-886-7247 (TTY)  Web:  Http://www.coverva.org     Local Help Enrolling in the [place]  778-208-6550 (MAIN)  Email:  health-help@nvfs$ .org  Web:  http://lewis-perez.info/  Address:  [address], [id number] [place], [place] [address]

## 2020-11-24 ENCOUNTER — Encounter: Payer: Self-pay | Admitting: Internal Medicine

## 2020-11-24 ENCOUNTER — Other Ambulatory Visit: Payer: Self-pay | Admitting: Critical Care Medicine

## 2020-11-24 ENCOUNTER — Encounter (HOSPITAL_BASED_OUTPATIENT_CLINIC_OR_DEPARTMENT_OTHER): Payer: Self-pay | Admitting: Endocrinology, Diabetes and Metabolism

## 2020-11-24 ENCOUNTER — Ambulatory Visit: Payer: Medicare Other | Attending: Critical Care Medicine

## 2020-11-24 DIAGNOSIS — R0600 Dyspnea, unspecified: Secondary | ICD-10-CM | POA: Insufficient documentation

## 2020-11-24 NOTE — Progress Notes (Signed)
[facility] OFFICE  [address], [place], [place] [address]     [name]    Date of Visit:  09/04/2018  Date of Birth: 31-Jan-1951  Age: 70 yrs.   Medical Record Number: [id number]  Referring Physician: Toni Amend MD, CRAIG  __   CURRENT DIAGNOSES     1. Hypercholesterolemia unspecified, E78.00  2. Hypertension (essential or benign or malignant), I10  3. Aortic valve insufficiency non-rheumatic, I35.1  4. Aneurysm,  Thoracic w/o rupture, I71.2  __  ALLERGIES    Bextra, Rash   __  MEDICATIONS     1. Proventil HFA 90 mcg/actuation aerosol inhaler, 1 puff q4h, prn  2. amlodipine 5 mg tablet, 1 po bid  3. buprenorphine  15 mcg/hour weekly transdermal patch, Use as Directed  4. Calcium 500 With D 500 mg (1,250 mg)-400 unit tablet, 1 po qd  5. carvedilol 6.25 mg tablet, 2 po bid  6. coenzyme Q10 100 mg capsule, 1 po qd  7. Zetia 10 mg tablet, 1 po qd   8. finasteride 5 mg tablet, 1 po qhs  9. levothyroxine 150 mcg tablet, 1 po qhs  10. montelukast 10 mg tablet, 1 po q pm  11. Centrum Silver 0.4 mg-300 mcg-250 mcg tablet, 1 po qd  12. pyridoxine (vitamin B6) 100 mg tablet, 1 po qd   13. simvastatin 20 mg tablet, 1 po q pm  14. testosterone 1 % (25 mg/2.5 gram) transdermal gel packet, Use as Directed  15. Vitamin D3 125 mcg (5,000 unit) tablet, 1 po qd  16. hydrocortisone 5 mg tablet, 3 to 5 po qd  17. ramipril 10  mg capsule, 2 po bid  18. Symbicort 80 mcg-4.5 mcg/actuation HFA aerosol inhaler, 2 puffs bid  19. amitriptyline 100 mg tablet, 1 po qhs  20. vitamin E (dl, acetate) 5,784 unit capsule, 5 po qd  21. 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 He has had a fairly complicated medical history starting with a meningioma that was  partially resected in 2004. It seems to have come back, but appears  to be stable, and there are no immediate plans for further intervention upon it. It did result in him having panhypopituitarism, for which he is on replacement therapy. He has underlying  asthma, hypertension, and hyperlipidemia. He has been having problems with ongoing fatigue and fibromyalgia and has recently been having memory loss. He is seeing a neurologist, but no clear neurological explanation has been given.     From a cardiovascular  standpoint, he had an echocardiogram at the [facility] in 2018. I do not have those results, but a report from another physician at that time, described mild aortic regurgitation and a mildly enlarged aortic root in the 39-40 mm range. His left ventricular  function was normal. Dr. [name] denies any cardiac symptoms, such as chest pain, shortness of breath, palpitations, or syncope.   __  PAST HISTORY      Past Medical Illnesses: Hyperlipidemia, Hypertension, Meningioma, Hypopituitarism;  Past Cardiac Illnesses : Aortic Regurgitation; Surgical Procedures: Craniotomy d/t meningioma of the brain 11/2002, Tonsil and Adenoidectomy;  Cardiology Procedures-Invasive: No previous interventional or invasive cardiology procedures.; Cardiology Procedures-Noninvasive : Echocardiogram September 2006, Echocardiogram February 2018; Left Ventricular Ejection Fraction: LVEF of  65% documented via echocardiogram on 10/17/2016   ___  FAMILY HISTORY  Father -- No cardiovascular disease     SOCIAL HISTORY    Alcohol Use: Rarely; Smoking : Marijuana use daily; Current every day smoker ([id number]); Diet: Regular diet; Exercise : No regular exercise;   __  REVIEW OF SYSTEMS    General : fatigue; Integumentary: Denies any change in hair or  nails, rashes, or skin lesions.; Eyes: wears eye glasses/contact lenses; Ears, Nose, Throat, Mouth : Denies any hearing loss, epistaxis, hoarseness or difficulty speaking.;Respiratory:  Denies dyspnea, cough, wheezing or hemoptysis.; Cardiovascular: Please review  HPI;  Abdominal : nausea;Musculoskeletal: fibromyalgia; Neurological : Denies any recurrent strokes, TIA, or seizure disorder.;  Psychiatric: stress; Endocrine:  fatigue; Hematologic/Immunologic: Denies any food allergies, seasonal allergies, bleeding disorders.   __  PHYSICAL EXAMINATION    Vital Signs:   Blood Pressure:  128/78 Sitting, Left arm, regular cuff    Weight: 181.00 lbs.   Height: 69"  BMI: 26.73   Pulse:  52/min. ekg       Constitutional: Cooperative, alert and oriented,well developed, well nourished,  in no acute distress. Skin: Warm and dry to touch, no apparent skin lesions, or masses noted. Head : Normocephalic, normal hair pattern, no masses or tenderness Eyes: EOMS Intact, PERRL, conjunctivae and  lids normal. Funduscopic exam and visual fields not performed. ENT: Ears, Nose and throat reveal no gross  abnormalities. No pallor or cyanosis. Dentition good. Neck: No palpable masses or adenopathy, no thyromegaly,  no JVD, carotid pulses are full and equal bilaterally without bruits. Chest: Normal symmetry, no tenderness to palpation, normal respiratory excursion,  no intercostal retraction, no use of accessory muscles, normal diaphragmatic excursion, clear to auscultation and percussion. Cardiac: Regular rhythm,  S1 normal, S2 normal, No S3 or S4, Apical impulse not displaced, no murmurs, gallops or rubs detected. Abdomen: Abdomen soft, bowel sounds normoactive,  no masses, no hepatosplenomegaly, non-tender, no bruits Peripheral Pulses: The femoral, popliteal, dorsalis  pedis, and posterior tibial pulses are full and equal bilaterally with no bruits auscultated. Extremities/Back: No deformities, clubbing, cyanosis, erythema  or edema observed. There are no spinal abnormalities noted. Normal muscle strength and tone. Neurological:  No gross motor or sensory deficits noted, affect appropriate, oriented to time, person and place.   __    Medications added today by the physician:      ECG:    Electrocardiogram showed sinus rhythm with no significant abnormalities.     IMPRESSIONS:  1. Mild aortic regurgitation by echocardiogram in 2018.   2. Mildly enlarged aortic root in the 39-40 mm range on that same echocardiogram.  3. Hypertension - controlled.   4. Hyperlipidemia - controlled.   5. Meningioma with partial resection in 2004. It has recurred, but at the moment, the plan  is for observation rather than intervention.  6. Panhypopituitarism - he is on replacement therapy.  7. Peripheral neuropathy with recent cognitive issues - he is seeing a neurologist for this, but no firm diagnosis has been made.  8. Fibromyalgia  and chronic fatigue.    RECOMMENDATIONS:  1. Echocardiogram to reassess degree of aortic regurgitation and aortic root size. If they remain only  mildly abnormal, then we will simply monitor these every couple of years.  2. Continue current antihypertensives. His blood pressures are well-controlled.  3. Follow-up in one year.    Vira Blanco, MD, [facility]    KKT/bjo     cc: Modesto Charon MD  CRAIG CHEIFETZ  MD  ____________________________  TODAYS ORDERS  2D, color flow, doppler 1 week  Diet mgmt  edu, guidance and counseling TODAY  12 Lead ECG Today  Return Visit 15 MIN 1 year

## 2020-11-24 NOTE — Progress Notes (Signed)
[facility] OFFICE  [address], [address], [place], [place] [address]     TELEMEDICINE VISIT       Shannon [name], [place]    Date of Visit:  03/25/2019  Date of Birth: 26-Oct-1950  Age: 70 yrs.   Medical Record Number: [id number]  __  CURRENT DIAGNOSES     1. Hypercholesterolemia unspecified,  E78.00  2. Hypertension (essential or benign or malignant), I10  3. Aortic valve insufficiency non-rheumatic, I35.1  4. Aneurysm, Thoracic w/o rupture, I71.2  5. Edema, lower extremity, R60.0  __   ALLERGIES    Bextra, Rash  __  MEDICATIONS      1. amlodipine 5 mg tablet, 1 po bid  2. Calcium 500 With D 500 mg (1,250 mg)-400 unit tablet, 1 po qd  3. carvedilol 6.25 mg tablet, 2 po bid  4. coenzyme Q10 100 mg capsule, 1 po qd  5. Zetia 10 mg tablet, 1 po qd  6.  finasteride 5 mg tablet, 1 po qhs  7. levothyroxine 150 mcg tablet, 1 po qhs  8. montelukast 10 mg tablet, 1 po q pm  9. Centrum Silver 0.4 mg-300 mcg-250 mcg tablet, 1 po qd  10. pyridoxine (vitamin B6) 100 mg tablet, 1 po qd  11.  simvastatin 20 mg tablet, 1 po q pm  12. testosterone 1 % (25 mg/2.5 gram) transdermal gel packet, Use as Directed  13. Vitamin D3 125 mcg (5,000 unit) tablet, 1 po qd  14. hydrocortisone 5 mg tablet, 3 to 5 po qd  15. Symbicort 80 mcg-4.5  mcg/actuation HFA aerosol inhaler, 2 puffs bid  16. vitamin E (dl, acetate) 8,657 unit capsule, 5 po qd  17. vitamin B12 500 mcg-folic acid 400 mcg tablet, 1 po qd  18. amitriptyline 50 mg tablet, 40mg  1 po qhs  19. Belbuca 450 mcg buccal  film, Use as directed  20. ramipril 10 mg capsule, 2 po qd  21. albuterol sulfate 2.5 mg/3 mL (0.083 %) solution for nebulization, 18g PRN  22. modafinil 200 mg tablet, 1 po qd  23. melatonin 10 mg tablet, 1 po qd  24. Metamucil 0.4  gram capsule, 4 po bid  25. sennosides 8.6 mg tablet, 2 po bid  26. Colace 100 mg capsule, 2 po bid  27. ibuprofen 600 mg tablet, 1 po qd prn  __  CHIEF COMPLAINT/REASON FOR VISIT   edema  __  HISTORY OF PRESENT ILLNESS  The visit today  was conducted via telemedicine due to COVID-19 precautions. The patient was in their home  and was informed and gave verbal consent to proceed. Dr. Georgina [name] has developed a little lower extremity edema that seems to be worse when he is on his feet and towards the end of the day. It is much better first thing in the morning. He has no shortness  of breath. He continues to have significant fatigue and lack of energy.   __  PAST HISTORY      Past Medical Illnesses: Hyperlipidemia, Hypertension, Meningioma, Hypopituitarism, Fibromyalgia;  Past  Cardiac Illnesses: Aortic Regurgitation; Surgical Procedures: Craniotomy d/t meningioma of the brain 11/2002,  Tonsil and Adenoidectomy; Cardiology Procedures-Invasive: No previous interventional or invasive cardiology procedures.;  Cardiology Procedures-Noninvasive: Echocardiogram September 2006, Echocardiogram February 2018, Echocardiogram 2020; Left Ventricular Ejection Fraction : LVEF of 65% documented via echocardiogram on 10/17/2016  PHYSICAL EXAMINATION    Vital Signs :  Blood Pressure:  138/76 home bp cuff    Weight:  175.00 lbs.  Height: 69.00"  BMI: 25.84    Pulse: 54/min.       Constitutional: cooperative,  alert Skin: no apparent skin lesions Head:  normocephalic Chest: no use of accessory muscles Extremities/Back : no edema present Neurological: oriented to time, person and place   __    Medications added  today by the physician:    IMPRESSIONS:  1. Mild stable aortic regurgitation - longstanding, along with mildly enlarged aortic root.   2. Recent  onset of lower extremity edema - the nature of his symptoms is highly suggestive of mild venous insufficiency. The fact that it fluctuates significantly does not suggest congestive heart failure or a side effect of amlodipine.   3. Hypertension.   4. Hyperlipidemia.  5. Meningioma with partial resection in 2004.   6. Panhypopituitarism.  7. Peripheral neuropathy with some cognitive issues.  8. Fibromyalgia and marked  chronic fatigue - this has become quite debilitating for him.      RECOMMENDATIONS:  1. Reassurance. I do not think his symptoms are related to amlodipine or congestive heart failure, but rather are due to mild venous insufficiency. He is going to try to keep his legs  elevated and he did purchase some compression stockings. If his symptoms worsen, a referral to a vein clinic might be worthwhile, but I do not think he is anywhere at that point yet.   2. Follow-up with me in six months.    Shannon [name] K.Brent Bulla,  MD, [facility]    KKT/bjo    cc: Shannon Charon MD  ____________________________  Shannon [name] Telemedicine Office F/U 15  Min 6 months

## 2020-11-25 ENCOUNTER — Encounter: Payer: Self-pay | Admitting: Internal Medicine

## 2020-11-25 ENCOUNTER — Telehealth: Payer: Self-pay

## 2020-11-25 ENCOUNTER — Encounter (HOSPITAL_BASED_OUTPATIENT_CLINIC_OR_DEPARTMENT_OTHER): Payer: Self-pay | Admitting: Endocrinology, Diabetes and Metabolism

## 2020-11-25 NOTE — Telephone Encounter (Signed)
Both of his messages were received. Dr. Tamera Punt has both of them. He's out of the office this week but is checking his messages. He will call the patient.

## 2020-11-25 NOTE — Telephone Encounter (Signed)
Patient called because he sent a message yesterday and never got a response.     "My symptoms of tachycardia and severe SOB with mild exertion are resolving after stopping cytomel this AM. What dosage of Levothyroxine should I now take? Thanks"      Requesting a call back or message via mychart.

## 2020-11-26 ENCOUNTER — Encounter: Payer: Self-pay | Admitting: Internal Medicine

## 2020-11-27 ENCOUNTER — Encounter (INDEPENDENT_AMBULATORY_CARE_PROVIDER_SITE_OTHER): Payer: Self-pay | Admitting: Cardiology

## 2020-11-27 ENCOUNTER — Ambulatory Visit (INDEPENDENT_AMBULATORY_CARE_PROVIDER_SITE_OTHER): Payer: Medicare Other | Admitting: Cardiology

## 2020-11-27 VITALS — BP 128/90 | HR 86 | Ht 69.0 in | Wt 178.0 lb

## 2020-11-27 DIAGNOSIS — I951 Orthostatic hypotension: Secondary | ICD-10-CM

## 2020-11-27 DIAGNOSIS — I10 Essential (primary) hypertension: Secondary | ICD-10-CM

## 2020-11-27 DIAGNOSIS — R0602 Shortness of breath: Secondary | ICD-10-CM

## 2020-11-27 MED ORDER — METOPROLOL SUCCINATE ER 25 MG PO TB24
25.0000 mg | ORAL_TABLET | Freq: Every evening | ORAL | 3 refills | Status: DC
Start: 2020-11-27 — End: 2021-03-09

## 2020-11-27 MED ORDER — SIMVASTATIN 20 MG PO TABS
20.0000 mg | ORAL_TABLET | Freq: Every evening | ORAL | 3 refills | Status: DC
Start: 2020-11-27 — End: 2021-07-05

## 2020-11-27 MED ORDER — RAMIPRIL 10 MG PO CAPS
10.0000 mg | ORAL_CAPSULE | Freq: Every day | ORAL | 3 refills | Status: DC
Start: 2020-11-27 — End: 2021-03-09

## 2020-11-27 MED ORDER — AMLODIPINE BESYLATE 5 MG PO TABS
5.0000 mg | ORAL_TABLET | Freq: Two times a day (BID) | ORAL | 3 refills | Status: DC
Start: 2020-11-27 — End: 2021-08-13

## 2020-11-27 NOTE — Progress Notes (Signed)
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  Medical Record #: [id number]  Requesting Physician: Shannon Charon, MD      CHIEF COMPLAINT:  Follow-up      HISTORY OF PRESENT ILLNESS    Shannon [name] is being seen today for cardiovascular evaluation at the request of Shannon Charon, MD. He is a pleasant 70 y.o. male who presents today for hospital follow-up.  He was seen in the emergency room on March 31 for shortness of breath.  His work-up in the emergency room was notable for an abnormal chest x-Maddon which noted an elevated left hemidiaphragm however he had a follow-up CT scan that was normal.  He was discharged home from the emergency room.  Since discharge the shortness of breath has improved.  Looking back he relates this to an alteration in his Cytomel dose.  Since decreasing the Cytomel dose back down the shortness of breath has significantly improved.  He continues to have significant fatigue which for him is chronic.      PAST MEDICAL HISTORY:   Past Medical History:   Diagnosis Date   . Aortic regurgitation    . Ascending aortic aneurysm 09/19/2018    4 cm ECHO   . Asthma    . Avascular necrosis 2008    R hip - did not require surgery   . Bilateral cataracts     budding   . Brain tumor 2003    Taken out in 2003, has returned    . Calculus of kidney    . Cold intolerance    . Color blindness    . Disorder of prostate     BPH   . DJD (degenerative joint disease)     Lumbar / Sacral   . Dry eyes     uses restasis eyedrops which helps (RF, CCP, CRP and ESR have been wnl in past)   . Dysautonomia    . Eczema 03/29/2016    Right thumb    . Encounter for blood transfusion 2004   . Encounter for hepatitis C screening test for low risk patient 08/2016     negative   . Encounter for pharmacogenetic testing 10/2015    MEDIMAP   . Fibromyalgia    . Gastroesophageal reflux disease    . Hemorrhoids    . Hyperlipidemia    . Hypertension    . Hypothyroidism    . Lateral epicondylitis     Left   . Malignant neoplasm of skin    . Mitral valve insufficiency    . Osteoporosis    . Peripheral neuropathy 12/26/2019   . Screening PSA (prostate specific antigen) 02/14/2018    0.4   . Sleep apnea     mild, no cpap   . Tinnitus     high pitched buzzing comes and goes - started after craniotomy for meningioma       PAST SURGICAL HISTORY:   Past Surgical History:   Procedure Laterality Date   . COLONOSCOPY  2008   . ECHOCARDIOGRAM, TRANSTHORACIC  04/29/2005    63   . ECHOCARDIOGRAM, TRANSTHORACIC  10/17/2016    63   .  EGD, COLONOSCOPY N/A 04/16/2019    Procedure: EGD, COLONOSCOPY;  Surgeon: Leatha Gilding, MD;  Location: [facility];  Service: Gastroenterology;  Laterality: N/A;  egd, colonoscopy  q1-unk, md req 45 min   . EXCISION, LESION  12/03/2008    benign lesion   . ORCHIOPEXY Left 1980    for intermittent torsion   . Subtotal resection of posterior sagittal meningioma  12/05/2002    Partial brain resection secondary to meningioma at [facility]   . TONSILLECTOMY AND ADENOIDECTOMY  as a child   . TOOTH EXTRACTION Right 06/2018       ALLERGIES:   Allergies   Allergen Reactions   . Bextra [Valdecoxib] Rash       MEDICATIONS:     Current Outpatient Medications:   .  albuterol sulfate HFA (ProAir HFA) 108 (90 Base) MCG/ACT inhaler, INHALE ONE PUFF BY MOUTH EVERY FOUR TO SIX HOURS AS NEEDED FOR WHEEZING, Disp: 25.5 g, Rfl: 1  .  AMITRIPTYLINE HCL PO, Take 40 mg by mouth nightly  , Disp: , Rfl:   .  clonazePAM (KlonoPIN) 0.5 MG tablet, Take 1 tablet (0.5 mg total) by mouth daily as needed for Anxiety, Disp: 30 tablet, Rfl: 0  .  ezetimibe (ZETIA) 10 MG tablet, Take 1 tablet (10 mg total) by mouth daily, Disp: 90 tablet, Rfl: 3  .  finasteride (PROSCAR) 5 MG tablet, Take 1 tablet (5 mg  total) by mouth nightly, Disp: 90 tablet, Rfl: 3  .  hydrocortisone (CORTEF) 5 MG tablet, Take 15 mg in am, 5 mg in early afternoon and 5 mg at 7 pm. Allow for extra doses in case of stress, illness, fever., Disp: 600 tablet, Rfl: 2  .  levothyroxine (SYNTHROID) 75 MCG tablet, Take 1 tablet (75 mcg total) by mouth every morning, Disp: 30 tablet, Rfl: 5  .  liothyronine (CYTOMEL) 5 MCG tablet, Take 3 tablets (15 mcg total) by mouth daily, Disp: 90 tablet, Rfl: 4  .  montelukast (SINGULAIR) 10 MG tablet, TAKE 1 TABLET BY MOUTH EVERY NIGHT AT BEDTIME AS DIRECTED, Disp: 90 tablet, Rfl: 3  .  Motegrity 2 MG Tab, Take 1 tablet by mouth daily, Disp: , Rfl:   .  Probiotic Product (ALIGN PO), Take 1 tablet by mouth daily, Disp: , Rfl:   .  senna (SENOKOT) 8.6 MG tablet, Take 1 tablet by mouth 2 (two) times daily  , Disp: , Rfl:   .  SYMBICORT 80-4.5 MCG/ACT inhaler, Inhale 2 puffs into the lungs two times daily (Patient taking differently: 2 puffs 2 (two) times daily Have patient rinse mouth after use), Disp: 10.2 g, Rfl: 3  .  tamsulosin (FLOMAX) 0.4 MG Cap, Take 1 capsule (0.4 mg total) by mouth Daily after dinner, Disp: 90 capsule, Rfl: 3  .  Testosterone 20.25 MG/ACT (1.62%) Gel, Apply 4 actuations daily (2 on each shoulder), Disp: 300 g, Rfl: 2  .  valACYclovir HCL (VALTREX) 500 MG tablet, Take 1 tablet (500 mg total) by mouth daily, Disp: 90 tablet, Rfl: 3  .  zolpidem (AMBIEN CR) 6.25 MG CR tablet, , Disp: , Rfl:   .  amLODIPine (NORVASC) 5 MG tablet, Take 1 tablet (5 mg total) by mouth 2 (two) times daily, Disp: 180 tablet, Rfl: 3  .  metoprolol succinate XL (Toprol XL) 25 MG 24 hr tablet, Take 1 tablet (25 mg total) by mouth nightly, Disp: 90 tablet, Rfl: 3  .  ramipril (ALTACE) 10 MG capsule, Take  1 capsule (10 mg total) by mouth daily, Disp: 90 capsule, Rfl: 3  .  simvastatin (ZOCOR) 20 MG tablet, Take 1 tablet (20 mg total) by mouth nightly, Disp: 90 tablet, Rfl: 3     FAMILY HISTORY:   family history includes  Asthma in his daughter; Dementia in his mother; Migraines in his sister; Tuberculosis in his mother.    SOCIAL HISTORY:   He reports that he has never smoked. He has never used smokeless tobacco. He reports current alcohol use. He reports previous drug use. Drug: Marijuana.        REVIEW OF SYSTEMS:   General: Denies recent weight loss, weight gain, fever or chills or change in exercise tolerance.;   Integumentary: Denies any change in hair or nails, rashes, or skin lesions.;   Eyes: Denies diplopia, glaucoma or visual field defects.;   Ears, Nose, Throat, Mouth: Denies any hearing loss, epistaxis, hoarseness or difficulty speaking.;  Respiratory: Denies dyspnea, cough, wheezing or hemoptysis.;   Cardiovascular: Please review HPI;   Abdominal : Denies ulcer disease, hematochezia or melena.;  Musculoskeletal:Denies any venous insufficiency, arthritic symptoms or back problems.;   Neurological : Denies any recurrent strokes, TIA, or seizure disorder.;   Psychiatric: Denies any depression, substance abuse or change in cognitive functions.;   Endocrine: Denies any weight change, heat/cold intolerance, polydipsia, or polyuria;   Hematologic/Immunologic: Denies any food allergies, seasonal allergies, bleeding disorders.   All other systems reviewed and negative except as above.       PHYSICAL EXAMINATION  Vitals:    11/27/20 1420   BP: 128/90   BP Site: Right arm   Patient Position: Sitting   Cuff Size: X-Large   Pulse: 86   SpO2: 99%   Weight: 80.7 kg (178 lb)   Height: 1.753 m (5\' 9" )        General Appearance:  A well-appearing male in no acute distress.    Skin: Warm and dry to touch, no apparent skin lesions, or masses noted.  Head: Normocephalic, normal hair pattern, no masses or tenderness   Eyes: Conjunctivae and lids unremarkable.    ENT:  No pallor or cyanosis.    Neck: JVP normal, no carotid bruit  Chest: Clear to auscultation bilaterally with good air movement and respiratory effort and no wheezes, rales, or  rhonchi   Cardiovascular: Regular rhythm, S1 normal, S2 normal, No S3 or S4, no murmurs  Extremities: Warm without edema, clubbing, or cyanosis.  Radial pulses are full and equal.   Neuro: Alert and oriented x3. No gross motor or sensory deficits noted, affect appropriate.      LABS:     Lab Results   Component Value Date    HGBA1C 5.8 09/18/2020    BNP 12 11/19/2020    TSH 0.16 (L) 11/19/2020       No results found for: BMP   Lab Results   Component Value Date    BNP 12 11/19/2020      Lab Results   Component Value Date    CHOL 177 02/13/2018    CHOL 189 09/14/2016    CHOL 194 02/13/2014     Lab Results   Component Value Date    HDL 54 02/13/2018    HDL 81 09/14/2016    HDL 65 02/13/2014     Lab Results   Component Value Date    LDL 76 02/13/2018    LDL 86 09/14/2016    LDL 107 (H) 02/13/2014  Lab Results   Component Value Date    TRIG 237 (H) 02/13/2018    TRIG 112 09/14/2016    TRIG 109 02/13/2014      Lab Results   Component Value Date    ALT 55 11/19/2020    AST 34 11/19/2020    GGT 125 (H) 01/12/2007    ALKPHOS 68 11/19/2020    BILITOTAL 1.6 (H) 11/19/2020      Lab Results   Component Value Date    WBC 12.38 (H) 11/19/2020    HGB 14.8 11/19/2020    HCT 43.3 11/19/2020    MCV 89.5 11/19/2020    PLT 278 11/19/2020        IMPRESSION:   Mr. Upchurch is a 69 y.o. male with the following problems:    . Recent emergency room visit for shortness of breath, now improved  . Orthostasis, complex and related probably to his underlying fibromyalgia and probable dysautonomia  . Hypertensive heart disease on echo 09/10/2018 he has LVH and mild aortic root dilatation  . Thoracic aortic aneurysm measuring 4.2 cm by recent chest CT  . Dyslipidemia-simvastatin use  . Partial resection of meningioma in 2004  . Panhypopituitarism-on replacement therapy  . Peripheral neuropathy and some cognitive issues  . Fibromyalgia and chronic fatigue syndrome    RECOMMENDATIONS:    . I recommended repeating an echocardiogram.  This will not  only allow [place] to evaluate the shortness of breath but it was also not wants to correlate his aortic size to his recent CT measurements.  Presuming the measurements correlate well he can continue to be monitored periodically with echocardiograms going forward  . Otherwise he will continue his cardiac medications without change.  I have refilled all of his cardiac medications for him  . He will follow up with his primary cardiologist Dr. Tami Ribas later this month as previously scheduled                                                 Orders Placed This Encounter   Procedures   . Echocardiogram Adult Complete W Clr/ Dopp Waveform        SIGNED:    Alvira Monday, MD         This note was generated by the Dragon speech recognition and may contain errors or omissions not intended by the user. Grammatical errors, random word insertions, deletions, pronoun errors, and incomplete sentences are occasional consequences of this technology due to software limitations. Not all errors are caught or corrected. If there are questions or concerns about the content of this note or information contained within the body of this dictation, they should be addressed directly with the author for clarification.

## 2020-11-30 ENCOUNTER — Encounter (HOSPITAL_BASED_OUTPATIENT_CLINIC_OR_DEPARTMENT_OTHER): Payer: Self-pay | Admitting: Endocrinology, Diabetes and Metabolism

## 2020-11-30 ENCOUNTER — Encounter: Payer: Self-pay | Admitting: Internal Medicine

## 2020-12-03 ENCOUNTER — Ambulatory Visit (INDEPENDENT_AMBULATORY_CARE_PROVIDER_SITE_OTHER): Payer: Medicare Other | Admitting: Radiology

## 2020-12-03 DIAGNOSIS — R0602 Shortness of breath: Secondary | ICD-10-CM

## 2020-12-04 ENCOUNTER — Encounter (INDEPENDENT_AMBULATORY_CARE_PROVIDER_SITE_OTHER): Payer: Self-pay | Admitting: Cardiology

## 2020-12-05 ENCOUNTER — Encounter (HOSPITAL_BASED_OUTPATIENT_CLINIC_OR_DEPARTMENT_OTHER): Payer: Self-pay | Admitting: Endocrinology, Diabetes and Metabolism

## 2020-12-08 DIAGNOSIS — R42 Dizziness and giddiness: Secondary | ICD-10-CM | POA: Insufficient documentation

## 2020-12-09 ENCOUNTER — Ambulatory Visit (INDEPENDENT_AMBULATORY_CARE_PROVIDER_SITE_OTHER): Payer: Medicare Other | Admitting: Cardiology

## 2020-12-09 ENCOUNTER — Encounter (INDEPENDENT_AMBULATORY_CARE_PROVIDER_SITE_OTHER): Payer: Self-pay | Admitting: Cardiology

## 2020-12-09 DIAGNOSIS — I10 Essential (primary) hypertension: Secondary | ICD-10-CM

## 2020-12-09 NOTE — Progress Notes (Signed)
[place] [facility] OFFICE PROGRESS NOTE    [facility] OFFICE -CARDIOLOGY  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  Medical Record #: [id number]  Requesting Physician: Modesto Charon, MD      CHIEF COMPLAINT: Hypertension      HISTORY OF PRESENT ILLNESS:    He is a pleasant 70 y.o. male who presents today for his routine follow-up visit.    He was in the emergency department a few weeks ago with feelings of exertional dyspnea with minimal exertion.  His blood work was unremarkable and imaging which eventually included a CT done with pulmonologist Depak Romilda Garret was also unremarkable.  He felt he was tachycardic when he was in the ER but his EKG which I reviewed showed a heart rate of 90 at the time.    His symptoms have resolved and in retrospect he thinks it may have been due to adjustment of his thyroid medication.    We spent a great deal of time reviewing his recent echo which was very reassuring.  His aorta was only 4.2 cm on his CT scan so I reassured him about that also and he probably does not need any imaging of his aortic root for a couple of years and we can do that echo and avoid radiation.      PAST MEDICAL HISTORY: He has a past medical history of Aortic regurgitation, Ascending aortic aneurysm (09/19/2018), Asthma, Avascular necrosis (2008), Bilateral cataracts, Brain tumor (2003), Calculus of kidney, Cold intolerance, Color blindness, Disorder of prostate, DJD (degenerative joint disease), Dry eyes, Dysautonomia, Eczema (03/29/2016), Encounter for blood transfusion (2004), Encounter for hepatitis C screening test for low risk patient (08/2016), Encounter for pharmacogenetic testing (10/2015), Fibromyalgia, Gastroesophageal reflux disease, Hemorrhoids, Hyperlipidemia, Hypertension,  Hypothyroidism, Lateral epicondylitis, Malignant neoplasm of skin, Mitral valve insufficiency, Osteoporosis, Peripheral neuropathy (12/26/2019), Screening PSA (prostate specific antigen) (02/14/2018), Sleep apnea, and Tinnitus. He has a past surgical history that includes EXCISION, LESION (12/03/2008); Colonoscopy (2008); Tonsillectomy and adenoidectomy (as a child); Orchiopexy (Left, 1980); Subtotal resection of posterior sagittal meningioma (12/05/2002); Tooth extraction (Right, 06/2018); EGD, COLONOSCOPY (N/A, 04/16/2019); ECHOCARDIOGRAM, TRANSTHORACIC (04/29/2005); ECHOCARDIOGRAM, TRANSTHORACIC (10/17/2016); and Ablation of dysrhythmic focus (meningioma).    ALLERGIES:   Allergies   Allergen Reactions   . Bextra [Valdecoxib] Rash       MEDICATIONS:   Current Outpatient Medications   Medication Sig   . albuterol sulfate HFA (ProAir HFA) 108 (90 Base) MCG/ACT inhaler INHALE ONE PUFF BY MOUTH EVERY FOUR TO SIX HOURS AS NEEDED FOR WHEEZING   . AMITRIPTYLINE HCL PO Take 40 mg by mouth nightly      . amLODIPine (NORVASC) 5 MG tablet Take 1 tablet (5 mg total) by mouth 2 (two) times daily   . clonazePAM (KlonoPIN) 0.5 MG tablet Take 1 tablet (0.5 mg total) by mouth daily as needed for Anxiety   . ezetimibe (ZETIA) 10 MG tablet Take 1 tablet (10 mg total) by mouth daily   . finasteride (PROSCAR) 5 MG tablet Take 1 tablet (5 mg total) by mouth nightly   . hydrocortisone (CORTEF) 5 MG tablet Take 15 mg in am, 5 mg in early afternoon and 5 mg at 7 pm. Allow for extra doses in case of  stress, illness, fever.   . levothyroxine (SYNTHROID) 125 MCG tablet Take 125 mcg by mouth Once a day at 6:00am   . metoprolol succinate XL (Toprol XL) 25 MG 24 hr tablet Take 1 tablet (25 mg total) by mouth nightly   . montelukast (SINGULAIR) 10 MG tablet TAKE 1 TABLET BY MOUTH EVERY NIGHT AT BEDTIME AS DIRECTED   . Motegrity 2 MG Tab Take 1 tablet by mouth daily   . Probiotic Product (ALIGN PO) Take 1 tablet by mouth daily   . ramipril  (ALTACE) 10 MG capsule Take 1 capsule (10 mg total) by mouth daily (Patient taking differently: Take 20 mg by mouth daily)   . senna (SENOKOT) 8.6 MG tablet Take 1 tablet by mouth 2 (two) times daily      . simvastatin (ZOCOR) 20 MG tablet Take 1 tablet (20 mg total) by mouth nightly   . SYMBICORT 80-4.5 MCG/ACT inhaler Inhale 2 puffs into the lungs two times daily (Patient taking differently: 2 puffs 2 (two) times daily Have patient rinse mouth after use)   . tamsulosin (FLOMAX) 0.4 MG Cap Take 1 capsule (0.4 mg total) by mouth Daily after dinner   . Testosterone 20.25 MG/ACT (1.62%) Gel Apply 4 actuations daily (2 on each shoulder)   . valACYclovir HCL (VALTREX) 500 MG tablet Take 1 tablet (500 mg total) by mouth daily   . zolpidem (AMBIEN CR) 6.25 MG CR tablet         FAMILY HISTORY: family history includes Asthma in his daughter; Dementia in his mother; Migraines in his sister; Tuberculosis in his mother.    SOCIAL HISTORY: He reports that he has never smoked. He has never used smokeless tobacco. He reports previous alcohol use of about 1.0 standard drink of alcohol per week. He reports that he does not use drugs.    PHYSICAL EXAMINATION    Visit Vitals  BP (!) 158/97 (BP Site: Left arm, Patient Position: Standing, Cuff Size: Small)   Pulse 90       General Appearance:  A well-appearing male in no acute distress.    Skin: Warm and dry to touch, no apparent skin lesions, or masses noted.  Head: Normocephalic, normal hair pattern, no masses or tenderness   Eyes:  conjunctivae and lids unremarkable.  ENT:   No pallor or cyanosis.     Neck: JVP normal, no carotid bruit, thyroid not enlarged   Chest: Clear to auscultation bilaterally with good air movement and respiratory effort and no wheezes, rales, or rhonchi   Cardiovascular: Regular rhythm, S1 normal, S2 normal, No S3 or S4. Apical impulse not displaced. No murmur. No gallops or rubs detected   Abdomen: Soft, nontender, nondistended, with normoactive bowel  sounds. No organomegaly.  No pulsatile masses, or bruits.   Extremities: Warm without edema. No clubbing, or cyanosis. All peripheral pulses are full and equal.   Neuro: Alert and oriented x3. No gross motor or sensory deficits noted, affect appropriate.            LABS:   Lab Results   Component Value Date    WBC 12.38 (H) 11/19/2020    HGB 14.8 11/19/2020    HCT 43.3 11/19/2020    PLT 278 11/19/2020     Lab Results   Component Value Date    GLU 107 (H) 11/19/2020    BUN 22 11/19/2020    CREAT 1.3 11/19/2020    NA 141 11/19/2020    K 3.8 11/19/2020  CL 107 11/19/2020    CO2 21 (L) 11/19/2020    AST 34 11/19/2020    ALT 55 11/19/2020     Lab Results   Component Value Date    MG 1.9 02/12/2019    TSH 0.16 (L) 11/19/2020    HGBA1C 5.8 09/18/2020    BNP 12 11/19/2020     Lab Results   Component Value Date    CHOL 177 02/13/2018    TRIG 237 (H) 02/13/2018    HDL 54 02/13/2018    LDL 76 02/13/2018         Impressions:    1. Systemic hypertension with orthostasis, complex and related probably to his underlying fibromyalgia and probable dysautonomia.  Improved recently with better nutrition and resolution of nausea.  Permitting range of blood pressures although sitting blood pressure is fine today at 118/80.    2. Hypertensive heart disease on echo 09/10/2018 he has LVH and mild aortic root dilatation.  Echo last week was similar but did not show LVH.  Aortic root dilatation had increased from 4.0 to 4.3 cm but 4.2 cm on recent CT scan.    3. Dyslipidemia-simvastatin use.    4. Partial resection of meningioma in 2004.    5. Panhypopituitarism-on replacement therapy.    6. Peripheral neuropathy and some cognitive issues.    7. Fibromyalgia and chronic fatigue syndrome.    8.  Recent vague dyspnea perhaps due to thyroid medication changes-resolved.  No ischemic changes on EKG and blood work was reassuring.    Recommendations:    1.  Continue current program.  Little to add today.    2.  Return perhaps in a  year and in a couple of years we will repeat his echo to check on his mild aortic insufficiency and his mildly dilated aortic root.      SIGNED:    Harvel Quale, MD          This note was generated by the Dragon speech recognition and may contain errors or omissions not intended by the user. Grammatical errors, random word insertions, deletions, pronoun errors, and incomplete sentences are occasional consequences of this technology due to software limitations. Not all errors are caught or corrected. If there are questions or concerns about the content of this note or information contained within the body of this dictation, they should be addressed directly with the author for clarification.

## 2020-12-10 ENCOUNTER — Emergency Department
Admission: EM | Admit: 2020-12-10 | Discharge: 2020-12-10 | Disposition: A | Discharge status: Home / Self Care | Payer: Medicare Other | Attending: Emergency Medicine | Admitting: Emergency Medicine

## 2020-12-10 DIAGNOSIS — R079 Chest pain, unspecified: Secondary | ICD-10-CM

## 2020-12-10 DIAGNOSIS — R110 Nausea: Secondary | ICD-10-CM | POA: Insufficient documentation

## 2020-12-10 LAB — CBC AND DIFFERENTIAL
Absolute NRBC: 0 10*3/uL (ref 0.00–0.00)
Basophils Absolute Automated: 0.1 10*3/uL — ABNORMAL HIGH (ref 0.00–0.08)
Basophils Automated: 0.7 %
Eosinophils Absolute Automated: 0.08 10*3/uL (ref 0.00–0.44)
Eosinophils Automated: 0.6 %
Hematocrit: 42.4 % (ref 37.6–49.6)
Hgb: 14.5 g/dL (ref 12.5–17.1)
Immature Granulocytes Absolute: 0.09 10*3/uL — ABNORMAL HIGH (ref 0.00–0.07)
Immature Granulocytes: 0.6 %
Lymphocytes Absolute Automated: 0.83 10*3/uL (ref 0.42–3.22)
Lymphocytes Automated: 5.9 %
MCH: 30.7 pg (ref 25.1–33.5)
MCHC: 34.2 g/dL (ref 31.5–35.8)
MCV: 89.8 fL (ref 78.0–96.0)
MPV: 10.6 fL (ref 8.9–12.5)
Monocytes Absolute Automated: 1.03 10*3/uL — ABNORMAL HIGH (ref 0.21–0.85)
Monocytes: 7.4 %
Neutrophils Absolute: 11.86 10*3/uL — ABNORMAL HIGH (ref 1.10–6.33)
Neutrophils: 84.8 %
Nucleated RBC: 0 /100 WBC (ref 0.0–0.0)
Platelets: 230 10*3/uL (ref 142–346)
RBC: 4.72 10*6/uL (ref 4.20–5.90)
RDW: 13 % (ref 11–15)
WBC: 13.99 10*3/uL — ABNORMAL HIGH (ref 3.10–9.50)

## 2020-12-10 LAB — COMPREHENSIVE METABOLIC PANEL
ALT: 94 U/L — ABNORMAL HIGH (ref 0–55)
AST (SGOT): 54 U/L — ABNORMAL HIGH (ref 5–34)
Albumin/Globulin Ratio: 1.7 (ref 0.9–2.2)
Albumin: 4.4 g/dL (ref 3.5–5.0)
Alkaline Phosphatase: 67 U/L (ref 38–106)
Anion Gap: 14 (ref 5.0–15.0)
BUN: 27 mg/dL (ref 9–28)
Bilirubin, Total: 1.9 mg/dL — ABNORMAL HIGH (ref 0.2–1.2)
CO2: 22 mEq/L (ref 22–29)
Calcium: 9.4 mg/dL (ref 7.9–10.2)
Chloride: 102 mEq/L (ref 100–111)
Creatinine: 1.1 mg/dL (ref 0.7–1.3)
Globulin: 2.6 g/dL (ref 2.0–3.6)
Glucose: 151 mg/dL — ABNORMAL HIGH (ref 70–100)
Potassium: 4.3 mEq/L (ref 3.5–5.1)
Protein, Total: 7 g/dL (ref 6.0–8.3)
Sodium: 138 mEq/L (ref 136–145)

## 2020-12-10 LAB — GFR: EGFR: >60

## 2020-12-10 LAB — TROPONIN I: Troponin I: 0.01 ng/mL (ref 0.00–0.05)

## 2020-12-10 LAB — LIPASE: Lipase: 23 U/L (ref 8–78)

## 2020-12-10 MED ORDER — SODIUM CHLORIDE 0.9 % IV BOLUS
1000.0000 mL | Freq: Once | INTRAVENOUS | Status: AC
Start: 2020-12-10 — End: 2020-12-10
  Administered 2020-12-10: 1000 mL via INTRAVENOUS

## 2020-12-10 MED ORDER — HYDROCORTISONE SOD SUC (PF) 100 MG IJ SOLR (WRAP)
25.0000 mg | Freq: Once | INTRAMUSCULAR | Status: AC
Start: 2020-12-10 — End: 2020-12-10
  Administered 2020-12-10: 25 mg via INTRAVENOUS
  Filled 2020-12-10: qty 2

## 2020-12-10 MED ORDER — SODIUM CHLORIDE 0.9 % IV SOLN
12.5000 mg | Freq: Once | INTRAVENOUS | Status: AC
Start: 2020-12-10 — End: 2020-12-10
  Administered 2020-12-10: 12.5 mg via INTRAVENOUS
  Filled 2020-12-10: qty 1

## 2020-12-10 NOTE — Discharge Instructions (Signed)
Thank you for choosing [facility] for your emergency care needs.   We strive to provide EXCELLENT care to you and your family.     IF YOU DO NOT CONTINUE TO IMPROVE OR YOUR CONDITION WORSENS, PLEASE CONTACT YOUR DOCTOR OR RETURN IMMEDIATELY TO THE EMERGENCY DEPARTMENT.     DOCTOR REFERRALS   Call [phone] (available 24 hours a day, 7 days a week) if you need any further referrals and we can help you find a primary care doctor or specialist. Also, available online at: [email or web address]   YOUR CONTACT INFORMATION   Before leaving please check with registration to make sure we have an up-to-date contact number. You can call registration at [phone] to update your information. For questions about your hospital bill, please call [phone]. For questions about your Emergency Dept Physician bill please call [phone].   FREE HEALTH SERVICES   If you need help with health or social services, please call 2-1-1 for a free referral to resources in your area. 2-1-1 is a free service connecting people with information on health insurance, free clinics, pregnancy, mental health, dental care, food assistance, housing, and substance abuse counseling. Also, available online at: http://www.[facility].org   MEDICAL RECORDS AND TESTS   Certain laboratory test results do not come back the same day, for example urine cultures. We will contact you if other important findings are noted. Radiology films are often reviewed again to ensure accuracy. If there is any discrepancy, we will notify you.   Please call [phone] to pick up a complimentary CD of any radiology studies performed. If you or your doctor would like to request a copy of your medical records, please call [phone].   ORTHOPEDIC INJURY   Please know that significant injuries can exist even when an initial x-Assad is read as normal or negative. This can occur because some fractures (broken bones) are not  initially visible on x-rays. For this reason, close outpatient follow-up with your primary care doctor or bone specialist (orthopedist) is required.   MEDICATIONS AND FOLLOWUP   Please be aware that some prescription medications can cause drowsiness. Use caution when driving or operating machinery.   The examination and treatment you have received in our Emergency Department is provided on an emergency basis, and is not intended to be a substitute for your primary care physician. It is important that your doctor checks you again and that you report any new or remaining problems at that time.   24 HOUR PHARMACIES  [facility] - [address], [place], [place] [address] (1.4 miles, 7 minutes)   [facility] - [address], [place], [place] [address] (6.5 miles, 13 minutes)   Handout with directions available on request.

## 2020-12-10 NOTE — ED Triage Notes (Signed)
Nausea onset this am, pt states similar episodes in past unknown cause

## 2020-12-10 NOTE — EDIE (Signed)
COLLECTIVE?NOTIFICATION?12/10/2020 09:00?Shannon [name], Shannon [name]?MRN: [id number]    Criteria Met      5 ED Visits in 12 Months    Security and Safety  No recent Security Events currently on file    ED Care Guidelines  There are currently no ED Care Guidelines for this patient. Please check your facility's medical records system.    Flags      Negative COVID-19 Lab Result - [facility] - [name] specimen collected from this patient was negative for COVID-19 / Attributed By: [place] Department of Health / Attributed On: 11/22/2020       Prescription Monitoring Program  450??- Narcotic Use Score  441??- Sedative Use Score  060??- Stimulant Use Score  280??- Overdose Risk Score  - All Scores range from 000-999 with 75% of the population scoring < 200 and on 1% scoring above 650  - The last digit of the narcotic, sedative, and stimulant score indicates the number of active prescriptions of that type  - Higher Use scores correlate with increased prescribers, pharmacies, mg equiv, and overlapping prescriptions  - Higher Overdose Risk Scores correlate with increased risk of unintentional overdose death   Concerning or unexpectedly high scores should prompt [name] review of the PMP record; this does not constitute checking PMP for prescribing purposes.      E.D. Visit Count (12 mo.)  Facility Visits   [facility] [place] 1   [place] [facility] 5   Total 6   Note: Visits indicate total known visits.     Recent Emergency Department Visit Summary  Date Facility [facility] Type Diagnoses or Chief Complaint   Dec 10, 2020 [facility] [place]      Nausea      Nov 19, 2020 [facility] [place]      Shortness of Breath      Nausea      Shortness of breath      May 11, 2020 [facility] [place] [facility]      N/V; weight loss;brain tumor      Nausea      Weight Loss      Apr 28, 2020 [facility] [place] [facility]      nausea vomiitng      Nausea      Apr 09, 2020 [facility] [place] [facility]      vomiting      Emesis      Nausea      Personal history of benign neoplasm of the brain      Personal history of other endocrine, nutritional and metabolic disease      Cyclical vomiting syndrome unrelated to migraine      Dec 19, 2019 [facility] [place] [facility] [place] [facility] [facility]      nausea and vomiting has [name] brain tumor not able to eat          Recent Inpatient Visit Summary  No recorded inpatient visits.     Care Team  Provider Specialty Phone Fax Service Dates   BRODERICK, Pearletha Furl, MD Internal Medicine   Current      Collective Portal  This patient has registered at the [facility] Emergency Department   For more information visit: [email or web address]     PLEASE NOTE:     1.   Any care recommendations and other clinical information are provided as guidelines or for historical purposes only, and  providers should exercise their own clinical judgment when providing care.    2.   You may only use this information for purposes of treatment, payment or health care operations activities, and subject to the limitations of applicable [employer] Polic[employer].    3.   You should consult directly with the organization that provided [name] care guideline or other clinical history with any questions about additional information or accuracy or completeness of information provided.    ? 2022 [employer] - www.[employer]medical.com

## 2020-12-10 NOTE — ED Provider Notes (Signed)
[facility]       [facility] History and Physical Exam     Patient Name: Shannon Neighbor, MD  Encounter Date:  12/10/2020  Attending Physician: Wilmer Floor, DO  Patient DOB:  1951/01/03  MRN:  [id number]  Room:  06/A06    Chief Complaint     Chief Complaint   Patient presents with   . Nausea     History of Presenting Illness     70 y.o. male hx multiple medical issues including hypothyroidism, fibromyalgia, renal stone, BPH, DJD, HTN, HLD, asthma, chronic fatigue syndrome, secondary adrenal insufficiency, CKD 3, meningioma of the brain, overall worsening of health over the past 4-5 years, presenting by POV with nausea. Hx of chronic nausea of unclear etiology with extensive outpatient workup include endoscopy and colonoscopy, brain imaging, neuro eval, PCP eval, evals at [facility] and [facility] clinic.  Onset of nausea early this morning; he's been dry heaving but no vomitus has been produced.  Denies fever, chills, abdominal pain  He reports his sx today are identical to the sx he's had in the past of intermittent nausea for which he's had the aforementioned workup.      PMD:  Modesto Charon, MD    Nursing Notes Review  Nursing Notes were reviewed.     Previous Records Review  Previous records were reviewed to the extent practicable for the current presentation.      Review of Systems   Constitutional: Negative for chills and fever.   HENT: Negative for congestion and nosebleeds.    Eyes: Negative for blurred vision and double vision.   Respiratory: Negative for cough and sputum production.    Cardiovascular: Negative for chest pain, orthopnea and claudication.   Gastrointestinal: Positive for nausea and vomiting (Dry heaving, no vomitus). Negative for abdominal pain.   Genitourinary: Negative for dysuria and frequency.   Musculoskeletal: Negative for back pain, joint pain and myalgias.   Skin: Negative for itching and rash.   Neurological: Negative for dizziness and  headaches.   Endo/Heme/Allergies: Negative for environmental allergies. Does not bruise/bleed easily.   All other systems reviewed and are negative.       Physical Exam     Vital Signs  BP 144/84   Pulse 89   Temp 99.1 F (37.3 C) (Oral)   Resp 18   SpO2 98%     Review of Vital Signs  The patient's vital signs and oxygen saturation were reviewed and interpreted by me, Wilmer Floor, DO.    Physical Exam    Constitutional: No acute distress. Not ashen. Nontoxic. Well-nourished, well-hydrated.  Eyes: No conjunctival discharge. EOMI. PERRLA. No scleral icterus. No nystagmus.  Head, Ears, Nose, Mouth, Throat: Normocephalic. Moist mucous membranes. No cyanosis.   Neck: Full painless range of motion. No obvious neck deformities. No tracheal deviation.   Cardiovascular: No obvious friction rubs or gallops. Normal capillary refill. 2+ radial, DP pulses B/L. No LE peripheral edema.  Respiratory/Chest: No obvious chest wall asymmetry. No resp distress. Lungs CTA B/L. No wheeze, no rhonchi, no rales.  Gastrointestinal/Abdominal:  Soft abdomen. No abdominal distention. No reproducible TTP. No rebound, no guarding. No palpable masses. Bowel sounds present. Abdomen not rigid.  + dry heaving  Musculoskeletal: Normal ROM. No focal extremity tenderness.   Back: No deformity. No significant scoliosis.   Neurological: AAOx4. No acute focal deficits. CNs intact as tested. Gross sensory exam symmetric B/L UEs, LEs. No focal motor weakness.  Skin:  Warm skin. No acute rash. No diaphoresis.        MDM and Clinical Notes     Hx & Exam Synthesis, Differential Diagnosis, Plan   59M from home, complicated medical history, here with nausea and dry heaving.  See HPI for details  Exam without abdominal TTP  Afebrile  Dry heaving on exam  Similar/same sx in the past intermittently, managed well in the ED with IV phenergan and IVFs  Home suppository phenergan not working per pt, wife    Plan:  Orders Placed This Encounter   . CBC and  differential   . Comprehensive metabolic panel   . Lipase   . Troponin I   . GFR   . ECG 12 lead   . promethazine (PHENERGAN) 12.5 mg in sodium chloride 0.9 % 50 mL IVPB   . sodium chloride 0.9 % bolus 1,000 mL       ED Course, Monitors, EKG, Critical Care, Splints, Consults, Re-evaluation, etc     EKG: sinus rhythm, rate 88bpm. negative ST Elevations. negative ST depressions. no ectopy.  I have personally reviewed the above EKG and, when available and documented, compared it to previous EKG(s).    1045.  Pt reassessed, states nausea is improved. Requesting ginger ale (provided).  Discussed labs including mild elevation in LFTs  Repeat abdominal exam unchanged - NTTP  Also requesting dose of IV steroids to address underlying adrenal insufficiency; I reviewed prior visits and he has received hydrocortisone 25mg  during his most recent ED visits x 3.     1208.  Pt reassessed, feeling much better  Has tolerated oral fluid, no vomiting  Discussed d/c plans - has anti-nausea meds at home for use    Return Precautions    The patient is aware that this evaluation is only a screening for emergent conditions related to his or her symptoms and presentation.   I discussed the need for prompt follow-up.  Patient demonstrates verbal understanding.  Patient advised to return to the ED for any worsening symptoms, uncontrolled pain if applicable, worsening fevers, or any changes in their condition prompting concern and need for repeat evaluation and/or additional management.        Past Medical History     Past Medical History:   Diagnosis Date   . Aortic regurgitation    . Ascending aortic aneurysm 09/19/2018    4 cm ECHO   . Asthma    . Avascular necrosis 2008    R hip - did not require surgery   . Bilateral cataracts     budding   . Brain tumor 2003    Taken out in 2003, has returned    . Calculus of kidney    . Cold intolerance    . Color blindness    . Disorder of prostate     BPH   . DJD (degenerative joint disease)     Lumbar  / Sacral   . Dry eyes     uses restasis eyedrops which helps (RF, CCP, CRP and ESR have been wnl in past)   . Dysautonomia    . Eczema 03/29/2016    Right thumb    . Encounter for blood transfusion 2004   . Encounter for hepatitis C screening test for low risk patient 08/2016    negative   . Encounter for pharmacogenetic testing 10/2015    [facility]   . Fibromyalgia    . Gastroesophageal reflux disease    . Hemorrhoids    .  Hyperlipidemia    . Hypertension    . Hypothyroidism    . Lateral epicondylitis     Left   . Malignant neoplasm of skin    . Mitral valve insufficiency    . Osteoporosis    . Peripheral neuropathy 12/26/2019   . Screening PSA (prostate specific antigen) 02/14/2018    0.4   . Sleep apnea     mild, no cpap   . Tinnitus     high pitched buzzing comes and goes - started after craniotomy for meningioma       Medications     No current facility-administered medications for this encounter.    Current Outpatient Medications:   .  albuterol sulfate HFA (ProAir HFA) 108 (90 Base) MCG/ACT inhaler, INHALE ONE PUFF BY MOUTH EVERY FOUR TO SIX HOURS AS NEEDED FOR WHEEZING, Disp: 25.5 g, Rfl: 1  .  AMITRIPTYLINE HCL PO, Take 40 mg by mouth nightly  , Disp: , Rfl:   .  amLODIPine (NORVASC) 5 MG tablet, Take 1 tablet (5 mg total) by mouth 2 (two) times daily, Disp: 180 tablet, Rfl: 3  .  clonazePAM (KlonoPIN) 0.5 MG tablet, Take 1 tablet (0.5 mg total) by mouth daily as needed for Anxiety, Disp: 30 tablet, Rfl: 0  .  ezetimibe (ZETIA) 10 MG tablet, Take 1 tablet (10 mg total) by mouth daily, Disp: 90 tablet, Rfl: 3  .  finasteride (PROSCAR) 5 MG tablet, Take 1 tablet (5 mg total) by mouth nightly, Disp: 90 tablet, Rfl: 3  .  hydrocortisone (CORTEF) 5 MG tablet, Take 15 mg in am, 5 mg in early afternoon and 5 mg at 7 pm. Allow for extra doses in case of stress, illness, fever., Disp: 600 tablet, Rfl: 2  .  levothyroxine (SYNTHROID) 125 MCG tablet, Take 125 mcg by mouth Once a day at 6:00am, Disp: , Rfl:   .  metoprolol  succinate XL (Toprol XL) 25 MG 24 hr tablet, Take 1 tablet (25 mg total) by mouth nightly, Disp: 90 tablet, Rfl: 3  .  montelukast (SINGULAIR) 10 MG tablet, TAKE 1 TABLET BY MOUTH EVERY NIGHT AT BEDTIME AS DIRECTED, Disp: 90 tablet, Rfl: 3  .  Motegrity 2 MG Tab, Take 1 tablet by mouth daily, Disp: , Rfl:   .  Probiotic Product (ALIGN PO), Take 1 tablet by mouth daily, Disp: , Rfl:   .  ramipril (ALTACE) 10 MG capsule, Take 1 capsule (10 mg total) by mouth daily (Patient taking differently: Take 20 mg by mouth daily), Disp: 90 capsule, Rfl: 3  .  senna (SENOKOT) 8.6 MG tablet, Take 1 tablet by mouth 2 (two) times daily  , Disp: , Rfl:   .  simvastatin (ZOCOR) 20 MG tablet, Take 1 tablet (20 mg total) by mouth nightly, Disp: 90 tablet, Rfl: 3  .  SYMBICORT 80-4.5 MCG/ACT inhaler, Inhale 2 puffs into the lungs two times daily (Patient taking differently: 2 puffs 2 (two) times daily Have patient rinse mouth after use), Disp: 10.2 g, Rfl: 3  .  tamsulosin (FLOMAX) 0.4 MG Cap, Take 1 capsule (0.4 mg total) by mouth Daily after dinner, Disp: 90 capsule, Rfl: 3  .  Testosterone 20.25 MG/ACT (1.62%) Gel, Apply 4 actuations daily (2 on each shoulder), Disp: 300 g, Rfl: 2  .  valACYclovir HCL (VALTREX) 500 MG tablet, Take 1 tablet (500 mg total) by mouth daily, Disp: 90 tablet, Rfl: 3  .  zolpidem (AMBIEN CR) 6.25 MG CR tablet, ,  Disp: , Rfl:     Allergies     Allergies   Allergen Reactions   . Bextra [Valdecoxib] Rash       Medical history, medications, and allergies reviewed.     Past Surgical History     Past Surgical History:   Procedure Laterality Date   . ABLATION OF DYSRHYTHMIC FOCUS  meningioma   . COLONOSCOPY  2008   . ECHOCARDIOGRAM, TRANSTHORACIC  04/29/2005    63   . ECHOCARDIOGRAM, TRANSTHORACIC  10/17/2016    63   . EGD, COLONOSCOPY N/A 04/16/2019    Procedure: EGD, COLONOSCOPY;  Surgeon: Leatha Gilding, MD;  Location: [facility];  Service: Gastroenterology;  Laterality: N/A;  egd, colonoscopy  q1-unk, md  req 45 min   . EXCISION, LESION  12/03/2008    benign lesion   . ORCHIOPEXY Left 1980    for intermittent torsion   . Subtotal resection of posterior sagittal meningioma  12/05/2002    Partial brain resection secondary to meningioma at [facility]   . TONSILLECTOMY AND ADENOIDECTOMY  as a child   . TOOTH EXTRACTION Right 06/2018       Family History   The family history is not significantly contributory to current presentation.   Family History   Problem Relation Age of Onset   . Asthma Daughter    . Tuberculosis Mother    . Dementia Mother    . Migraines Sister        Social History   Social history is not significantly contributory to the patient's current presentation.   Social History     Occupational History   . Not on file   Tobacco Use   . Smoking status: Never Smoker   . Smokeless tobacco: Never Used   Vaping Use   . Vaping Use: Never used   Substance and Sexual Activity   . Alcohol use: Not Currently     Alcohol/week: 1.0 standard drink     Types: 1 Drinks containing 0.5 oz of alcohol per week     Comment: rarely   . Drug use: Never     Types: Marijuana   . Sexual activity: Not Currently     Partners: Female     Birth control/protection: None        Review of Systems     See HPI for review of systems that is relevant to the current presentation.   All other systems reviewed: negative.     ED Medications Administered     ED Medication Orders (From admission, onward)    None          Orders Placed During This Encounter   No orders of the defined types were placed in this encounter.      Diagnostic Study Results     The results of the diagnostic studies below were reviewed by the ED provider:    Labs  Results     Procedure Component Value Units Date/Time    Troponin I [[id number]] Collected: 12/10/20 0936    Specimen: Blood Updated: 12/10/20 1012     Troponin I 0.01 ng/mL     Comprehensive metabolic panel [[id number]]  (Abnormal) Collected: 12/10/20 0936    Specimen: Blood Updated: 12/10/20 1007     Glucose 151  mg/dL      BUN 27 mg/dL      Creatinine 1.1 mg/dL      Sodium 782 mEq/L      Potassium 4.3 mEq/L  Chloride 102 mEq/L      CO2 22 mEq/L      Calcium 9.4 mg/dL      Protein, Total 7.0 g/dL      Albumin 4.4 g/dL      AST (SGOT) 54 U/L      ALT 94 U/L      Alkaline Phosphatase 67 U/L      Bilirubin, Total 1.9 mg/dL      Globulin 2.6 g/dL      Albumin/Globulin Ratio 1.7     Anion Gap 14.0    Lipase [[id number]] Collected: 12/10/20 0936    Specimen: Blood Updated: 12/10/20 1007     Lipase 23 U/L     GFR [[id number]] Collected: 12/10/20 0936     Updated: 12/10/20 1007     EGFR >60.0    CBC and differential [[id number]]  (Abnormal) Collected: 12/10/20 0936    Specimen: Blood Updated: 12/10/20 0945     WBC 13.99 x10 3/uL      Hgb 14.5 g/dL      Hematocrit 08.6 %      Platelets 230 x10 3/uL      RBC 4.72 x10 6/uL      MCV 89.8 fL      MCH 30.7 pg      MCHC 34.2 g/dL      RDW 13 %      MPV 10.6 fL      Neutrophils 84.8 %      Lymphocytes Automated 5.9 %      Monocytes 7.4 %      Eosinophils Automated 0.6 %      Basophils Automated 0.7 %      Immature Granulocytes 0.6 %      Nucleated RBC 0.0 /100 WBC      Neutrophils Absolute 11.86 x10 3/uL      Lymphocytes Absolute Automated 0.83 x10 3/uL      Monocytes Absolute Automated 1.03 x10 3/uL      Eosinophils Absolute Automated 0.08 x10 3/uL      Basophils Absolute Automated 0.10 x10 3/uL      Immature Granulocytes Absolute 0.09 x10 3/uL      Absolute NRBC 0.00 x10 3/uL           Radiologic Studies  Radiology Results (24 Hour)     ** No results found for the last 24 hours. Lynford Humphrey and MD Attestations     Rendering Provider: Wilmer Floor, DO        Diagnosis and Disposition     Clinical Impression  1. Nausea        Disposition  ED Disposition     ED Disposition   Discharge    Condition   --    Date/Time   Thu Dec 10, 2020 12:10 PM    Comment   Kmari Enid Derry, MD discharge to home/self care.    Condition at disposition: Stable                  Discharge Prescriptions       New Prescriptions    No medications on file            Wilmer Floor, DO  12/10/20 1525

## 2020-12-11 ENCOUNTER — Encounter (INDEPENDENT_AMBULATORY_CARE_PROVIDER_SITE_OTHER): Payer: Self-pay | Admitting: Cardiology

## 2020-12-11 LAB — ECG 12-LEAD
Atrial Rate: 88 {beats}/min
P Axis: 16 degrees
P-R Interval: 158 ms
Q-T Interval: 372 ms
QRS Duration: 104 ms
QTC Calculation (Bezet): 450 ms
R Axis: -21 degrees
T Axis: 61 degrees
Ventricular Rate: 88 {beats}/min

## 2020-12-15 ENCOUNTER — Ambulatory Visit (INDEPENDENT_AMBULATORY_CARE_PROVIDER_SITE_OTHER): Payer: Medicare Other | Admitting: Internal Medicine

## 2020-12-15 ENCOUNTER — Telehealth: Payer: Self-pay | Admitting: Internal Medicine

## 2020-12-15 ENCOUNTER — Encounter: Payer: Self-pay | Admitting: Internal Medicine

## 2020-12-15 VITALS — BP 138/80 | HR 92 | Temp 97.9°F | Resp 14

## 2020-12-15 DIAGNOSIS — G901 Familial dysautonomia [Riley-Day]: Secondary | ICD-10-CM | POA: Insufficient documentation

## 2020-12-15 DIAGNOSIS — H938X2 Other specified disorders of left ear: Secondary | ICD-10-CM

## 2020-12-15 NOTE — Telephone Encounter (Signed)
Contacted patient - will come for appt today

## 2020-12-15 NOTE — Telephone Encounter (Signed)
Pt called and stated that he needs to have earwax removed from his ears. He has been trying to get it out for a week but he has not been successful. Spoke with Dorothey Baseman and was advised to inform pt that we will speak with the dr and will call back with a time to come in. Pt cannot come in on Friday. Will be visiting his mom.

## 2020-12-15 NOTE — Progress Notes (Signed)
Chief Complaint   Patient presents with   . 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  He has been feeling a pressure sensation in the left ear and thinks he has excessive earwax.  He has required removal of this in the past irrigation.  He has been using Cerumenex without particular improvement.  He denies any notable hearing loss or new worsening dizziness or lightheadedness or ear discharge or bleeding.  He has no new worsening nasal congestion or throat symptoms.     He was seen recently in the ER for one of his episodes of extreme fatigue where he was given IV fluids and Phenergan and IV dose steroids.  He has been followed recently by his pulmonologist and cardiologist for his complex and longstanding dysautonomia orthostatic problems and fibromyalgia.     Active Problems  Patient Active Problem List    Diagnosis Date Noted   . Secondary adrenal insufficiency 01/17/2017     Priority: High     - 04/14/20: eval w Dr. Tamera Punt: Hypopituitarism in the setting of brain surgery for meningioma. Patient is currently on [facility] for (AI), levothyroxine, testosterone and growth hormone replacement. Will order an ACTH stimulation tests to see how much of adrenal reserve he has. As far a steststerone (T), his T levels are sub-optimal and I recommended a target T level in the 440 to 500 ng/dL for optimal efficacy. Will titrate the dose of the topical androgen to achieve that goal. Repeat T level in few weeks after dose totration He can return in few months or as needed.  - 04/26/18: Dr. Beckie Salts [facility]): initial eval showed possible borderline glucocorticoid deficiency.  He has been taking supraphysiologic doses of hydrocortisone since 2017.  We do not think he has a hydrocortisone absorption problem based on random serum cortisol and 24-hour values.  We believe episodes of abd pain and nausea improved with SDS because of  anti-inflammatory effects.  We are unable to generate a unifying endocrine dx that explains sx.  Pt understands dangers of continuing supraphysiologic glucocorticoids but reluctant to reduce for fear of worsening illness.  We do feel other hormone replacement doses are more appropriate.  We do not rec changes. Pt asked for referral to fibromyalgia specialist.  Pt with h/o primary hypothy has empty sella and biochemical e/o hypogonadism and GH deficiency.  Originally, ACTH test borderline, but now after long-term glucocorticoid tx he has obvious central AI.  His thyroid and GH doses are if anything on high side.  We cannot attrribute sx to underdosing.  Explained this to Dr. [name] and have contacted [facility] to see if fibromyalgia expert or clinical trial are available .  - 09/29/17 Dr. Tamera Punt endo: Hypopituitarism in setting of brain surgery for meningioma. Pt currently on HC for (AI), levothyroxine, testosterone and GH replacement. The question as I d/w him is whether he is using HC to treat non specific sx or what he refers to as "quality of life" or whether he does need high doses due to some issue w HC metabolism. I explained to him sx he experiences are not specific for adrenal disease and there is concern about chronic over exposure to corticosteroid tx. I encouraged him to taper dose of HC to physiologic dose, in range of 20 mg in am and 10 mg in pm. One can also use dose that is based on body  surface. In case of suspicion of "adrenal crisis", I suggested he use one injection of Solucortef rather than high oral doses of HC for several days. I also recd opinion w Dr. Leonides Grills, Chief, Endocrinology at [facility] in order to discuss adrenal issue. I'm happy to continue to see him at [facility]. I also explained that have not prescribed GH replacement for adults so he will continue to follow with [facility]. He can return in few mos or as needed.  - 09/06/17: eval w Endocrine Dr. Eyvonne Mechanic [facility]): Although after we dx  secondary AI, GH deficiency, hypogonadotropic hypogonadism, and primary hypot a/w a partially empty sella on imaging, and he had initial + response to replacement as time has gone on, he has not done well and episodes where he gets very ill w muscle aches like he always has flu nausea, vomiting, LH and in general on physiologic hydrocortisone, he awakes in am with discomfort, particularly in back , and after meds it will be several hrs until he has energy to get up and do minor tasks. Around [place], he was ill with viral illness for 3 days and had crisis. He was tx w 100 mg of hydrocortisone orally every 8 hrs, and sx resolved. He subsequently tapered on oral hydrocortisone and in last 3-4 days has been taking between 130-110 mg total per day and continues to feel well as he tapering this. We are collecting a 24-hr urine free cortisol on this dose. His other levels are interesting as well. He is on GH at 0.3 mg a day. In past, this had IGF-1's at upper end of nl range to mildly elevated, now Z-score is just +1.28 and despite taking AndroGel cream on daily basis, testosterone level was 19, whereas in past this was nl. He is sure he had not forgotten testosterone. His thyroid tests similar to past with a nl free T4 of 1.5 and TSH mildly suppressed. The current ACTH is less than 1. A/P  #1 Empty sella w pituitary insufficiency  #2 Secondary AI  It is unusual that he feels well now after several days of high-dose hydrocortisone. Either he is malabsorbing hydrocortisone, he is super metabolizing hydrocortisone, he has developed cortisol resistance, or he has undx steroid-sensitive disease. There is really no evid for other disease with other testing done including multiple rheum antibodies. I did order genetic gene interaction testing and waiting for review of this from pharmacologist to see if suggestion of metabolism abnormalities re various hormones. The 24-hour urine cortisol should give hint regarding  absorption. Plan we have developed is to continue to slowly taper oral hydrocortisone over next several days to weeks, and I would note a tapering schedule depending on clinical response. I will get a baseline bone density and body composition, and check cortisol level morning 2-3 hrs after he took 50 mg of Cortef, and I am going to repeat testosterone 1-2 hrs after he applied AndroGel. #3 Secondary hypogonadism, on replacement : Unclear why testosterone is so different than before where we test this. It may be lab error, but it is unlikely he did not apply testosterone. #4 GH deficiency:  unclear why same dose is resulting in lower IGF-1, but increase dose to 0.4 mg per day.  #5 Primary hypot, on replacement  There is likely pituitary component, and will maintain replacement keeping FT4 stable at 1.5.  #6 Cognitive dysfunction  evaluated a year ago w/o specific dx. He feels now after several days of higher doses of hydrocortisone  thinking is clearing. Mild ascending aortic dilation and aortic insufficiency  Would rec'd repeating echo next year, #10 Meningioma  unchanged on MRI from 2008 and confirms slight increase from 2017. #11 Muscle pain  This has resolved with hydrocortisone. I doubt there is underlying muscle disease,   - 08/31/17: Pt email: advised by pcp to take 100 mg of hydrocortisone Q8H for three days and then double usual dose (25-30 mg/day) until I returned to [facility]. The rescue dosage worked like Pharmacist, community. I began to feel better almost immediately and now nausea, loss of appetite, dizziness with standing, chills, cold feet, and perceived cognitive difficulties have markedly improved. I cannot remember last time I felt this good!   - 08/25/17: Dr. Toni Amend: pt with possible adrenal insuff while under stress. Trial of hydrocortisone to 100mg  tid for 24-48 hours per [facility] end[facility] clinic.  - 11/23/16: eval w Endocrine Dr. Tawanna Cooler [facility]): due for reassessment of FT4 and IGF 1 in 4 weeks after changing dose on  10/27/16.  Pursue recs for insomnia from psych. MRI did not show signs of dementia. PFT nl. FU 6-12 mos       . Chronic fatigue syndrome with fibromyalgia 01/24/2013     Priority: High     Aug 2006 noted onset of severe fatigue and myalgias. Extensive eval for CT and autoimmune disease at [facility]. Discovered to have inadequate thyroid and low free testosterone. He was eval by endocrine ultimately dx w fibromyalgia / chronic fatigue syndrome prescribed various meds including provigil, cymbalta, gabapentin and pregabalin, and elavil (all w/o improvement).     - 06/05/20: Pt is interested in trial of Valtrex to help with potential treatment of fibromyalgia.  I told him there are no clear data or guideline or recs for use of this in ps with his medical history (he has done research on own suggesting it could be of benefit).  He is interested in doing a short trial of medication at suppressive dose to see if this also helps with symptoms. I reviewed risks benefits and side effects of medication with him and he would like to proceed.  Rec trial for 90 days.   - 03/30/20: eval w Dr. Romilda Garret (ID): Chronic fatigue brain fog fibromyalgia unresponsive to medical therapies.  Orthostatic hypotension.  Glossitis.  Poor sleep quality.  Recommend labs for the research follow-up in 4 weeks  - 01/21/20: eval w Rheum Dr. Mellody Dance - FMS - has not responded to medical therapies. No new options have come to clinical care since FDA approval of duloxetine, pregabalin, milnacripran for these symptoms Dysautonomia - commonly associated with FMS and other forms of central sensitivity state. Not known to be a cause and effect relationship.  - 10/03/18: email from Dr. Jon Billings: [facility] Labwork for paraneoplastic antibodies that can be a/w peripheral neuropathy negative.  I strongly suspect numbness in feet developed is prediabetes (A1C 6.3). Lose weight and exercise. The only lab of concern is B6 over 80. I don't feel this is causing neuropathy. Elevated  B6 has been shown to cause neuropathy, primarily small fiber, in animal and likely does in pts as well.  I get concerned if level is >100, but 80s not far from that. If you are taking B6 (I.e, pyridoxine), I suggest stop. I also want to emphasize though we chased this, I don't believe this is cause (prediabetes, B6, or small fiber neuropathy) of majority of your sx. You have chronic fatigue syndrome and I would continue to advise you to  seek care with specialty center for this challenging disorder.   - Skin biopsy 08/06/18: Nl epidermal nerve fiber density in two distal sites, but markedly reduced over proximal thigh. [place] red negative. SUMMARY: Unclear significance of reduced density of IENFD over proximal thigh. May reflect meralgia paresthetica, but not apparent from pt's sx. No indication of length-dependent small fiber neuropathy on testing. If present, extremely mild and restricted to feet. If no meralgia paresthetica, could consider Sjogren's testing, if not done, and anti-Hu for non-length dependent small fiber neuropathy.  - 08/06/18: eval w Dr Bosie Clos [facility]): h/o fatigue and myalgias in 2006 after having viral prodrome continues to be c/w chronic fatigue.  Additionally, insomnia with difficulty falling sleep maintaining sleep.  He reports sleeps no more than 1-1.5 hrs at time.  This likely contribute sig to fatigue and somnolence.  Rec trial of amitriptyline.  Amitriptyline may also benefit chronic pain.  For chronic pain/myalgias he is taking tramadol, ( which we rec stopping ) buprenorphine and clonazepam.  We advised him to minimize exposure to opiates and BZD as these can make somnolent fatigued and be factor in causing cognitive issues.  Is not clear what caused his paraphasic errors at [facility] 07/30/18, but suspect fatigue / meds played role.  He reports 1 yr of numbness and tingling in feet.  Exam sig only for mildly decreased sensation to temp bilateral feet -  intact sensation pinprick.  He also reports episodes of nausea dry heaving.  To eval for possible small fiber neuropathy causing peripheral numbness or tingling and possible gastric dysmotility suggest skin bx which he elected to pursue. In addition to episodes of nausea and dry heaving.  He also reports having multiple episodes of loose stools and abd cramping.  Suspect component of IBS, but rec further eval w GI.  Plan.  For insomnia in setting of chronic fatigue -amitriptyline 10 mg at night for 1 week, then every week, increase by 10 mg to a dose of 40 mg.  Rec stopping tramadol skin biopsy for WU of small fiber neuropathy.  Consider eval at [facility] for chronic fatigue.  GI referral for eval of loose stools and cramping.  Continue w neurosurgery or [facility] for meningioma.  - 09/08/17: eval with Rheum ([facility]): A/P #1 Pituitary insufficiency  #2 Secondary AI  #3 Myalgias  #4 Sicca sx: suspicion is pt has underlying rheum process that accounts for steroid responsive sx here is exceedingly low. The constellation of sx he describes during a crisis not typical for autoimmune disease. Inflammatory myopathy would typically present with muscle weakness, and muscle strength preserved, and he is endorsing myalgia. I reviewed pic he brought in with him for episodes in hands / feet, and I am at loss. It does not look like CRPS, erythemalgia, or Raynaud's. He has had extensive testing here so far including ANA, ENAs, complements, all within nl limits as are inflammatory markers. To complete eval, I think we can check cryoglobulins, ANCAs, CCP, and pain, which seems myofascial than intra-articular or muscular, would rec obtaining x-rays of spine as well as feet ankles. I will review results on portal.       . Orthostatic hypotension 12/27/2019     Priority: Medium     - eval w Cardiology 12/09/20: 1. Systemic hypertension with orthostasis, complex and related probably to underlying fibromyalgia and probable  dysautonomia.Improved recently with better nutrition and resolution of nausea.  Permitting range of blood pressures although sitting blood  pressure is fine today at 118/80. 2. Hypertensive heart disease on echo 09/10/2018 he has LVH and mild aortic root dilatation.  Echo last week was similar but did not show LVH.  Aortic root dilatation had increased from 4.0 to 4.3 cm but 4.2 cm on recent CT scan. 3. Dyslipidemia-simvastatin use. 4. Partial resection of meningioma in 2004. 5. Panhypopituitarism-on replacement therapy. 6. Peripheral neuropathy and some cognitive issues. 7. Fibromyalgia and chronic fatigue syndrome. 8.  Recent vague dyspnea perhaps due to thyroid medication changes-resolved.  No ischemic changes on EKG and blood work was reassuring. Recommendations: 1.  Continue current program.  Little to add today. 2.  Return perhaps in a year and in a couple of years we will repeat his echo to check on his mild aortic insufficiency and his mildly dilated aortic root.  - eval w Cardiology 11/27/20: I rec repeating an echo.  This will not only allow [place] to eval shortness of breath but it was also not wants to correlate his aortic size to his recent CT measurements.  Presuming the measurements correlate well he can continue to be monitored periodically with echocardiograms going forward Otherwise he will continue his cardiac medications without change.  I have refilled all of his cardiac medications for himHe will follow up with his primary cardiologist Dr. Tami Ribas later this month as previously scheduled  - eval w Cardiology Dr. Tami Ribas 06/02/20: 1. Orthostasis, complex and related probably to his underlying fibromyalgia and probable dysautonomia.  Improved recently with better nutrition and resolution of nausea. 2. Hypertensive heart disease on echo 09/10/2018 he has LVH and mild aortic root dilatation. 3. Dyslipidemia-simvastatin use. 4. Partial resection of meningioma in 2004. 5. Panhypopituitarism-on  replacement therapy. 6. Peripheral neuropathy and some cognitive issues. 7. Fibromyalgia and chronic fatigue syndrome. Recs: 1.  No changes to medications at this time. 2.  Continue gradual increase in exercise and thigh-high compression stockings with avoidance of dehydration or sodium depletion. 3.  I will see him again in 6 months and we can discuss when to do his next echo to look at his his aorta size.   - eval w cardiolog NP 05/14/20: 1.Decrease his Ramipril form 20mg  to 10mg  to assist with correcting orthostasis. If his sitting BP is or above, he can take the additional tab of Ramipril 2.         Continue hydration, liberalize salt intake, compression stockings 3.Continue monitoring blood pressure and heart rate at home. 4.         Continue to followup with his neurologist, PCP and GI specialists 5.Follow-up with Dr.Nayak as scheduled in approximately 3 weeks  - eval w [facility] NP 03/16/20: Complex symptomatic orthostatichypotension with spells of nausea and fatigue, likely related to fibromyalgia and probable dysautonomia.  He has had workups at [facility] and [facility] and sees specialists locally. Hypertension, not ideally controlled although would not be overly aggressive in lowering blood pressures due to #1. Longstanding mild AV regurgitation withamildly dilated asc aorta, stable by most recent echo Jan 2020. on statin and Zetia. Most recent LDL 76 2019. Prior partial meningioma resection in 2004. Panhypopituitarism. Hypothyroidism. Peripheral neuropathy. Cognitive issues. Fibromyalgiaandchronic fatiguesyndrome,which remainquite debilitating. REC: 1. Continue current meds. 2. Continue monitoring pressure and rate 3. Follow-up with Dr. Tami Ribas in 3 to 4 months, sooner as needed.   - eval w [facility] Card[facility] NP 02/05/20 Complex symptomatic orthostatichypotension, likely related to fibromyalgia and probable dysautonomia.  Several recent  changes to antihypertensive regimen with subsequent  improvement both in blood pressures and symptoms. Hypertension, controlled on current regimen although still with high blood pressure spikes. Longstanding mild aortic valve regurgitation with a mildly dilated ascending aorta, stable by most recent echocardiogram in January 2020. Hyperlipidemia, on statin and Zetia. Most recent LDL 76 in 2019. Prior partial meningioma resection in 2004. Panhypopituitarism. Hypothyroidism. Peripheral neuropathy. Cognitive issues. Fibromyalgia and chronic fatigue syndrome, which are quite debilitating. RECS: 1. Increase Toprol to 50 mg daily.  He will take at bedtime. 2. Continue remainder of cardiac medications, including higher dose of amlodipine. 3.  Continue use of thigh-high compression stockings.  These have helped significantly. 4. Continue monitoring pressure.  5. Telehealth follow-up with an APP in 3 weeks, sooner as needed.  - eval w cardiology Dr. Tami Ribas 01/16/20: 1.  Orthostasis, complex and related probably to underlying fibromyalgia and probable dysautonomia.  I explained salt loading despite hypertension may be useful to him and that 1 out of 5 patients is sodium sensitive with hypertension.  If his hypertension worsens he can of course back off.  I told him thigh-high compression stockings are more effective than anything knee-high and he will try those.  He already has rx from [facility], NP.  He is going to insist on thigh-high.  We also discussed restarting a BB but a beta 1 selective beta-blocker such as metoprolol succinate 2.  heart disease on echo 09/10/2018 he has LVH and mild aortic root dilatation. 3.  Dyslipidemia-simvastatin use. 4.  Partial resection of meningioma in 2004. 5.  Panhypopituitarism-on replacement therapy. 6.  Peripheral neuropathy and some cognitive issues. 7.  Fibromyalgia and chronic fatigue syndrome. Rec: 1.  Thigh-high compression stockings 20 to 30 mm graded 2.  Begin metoprolol  succinate 25 mg daily.  We may need to increase that to twice daily ultimately. 3.  Loosen salt restriction.  He will even attempt careful salt loading. 4.  He has a follow-up visit in a couple of weeks with the APP.         [name] Irritable bowel syndrome with constipation / Cyclic vomiting syndrome 02/11/2019     Priority: Medium     - 05/21/20: Normal solid-phase gastric emptying study  - 04/30/20 - eval w Dr Jodie Echevaria: He has been for infusions of IV fluid 3 times since last visit for nausea and vomiting.  Continued on Motegrity TCA and xifaxan -though the meningioma is growing its not thought to be the cause.  He is starting a higher dose of hydrocortisone due to adrenal insufficiency.  He only has a few hours a day with no nausea.  He actually has not vomited but the nausea is unrelenting.  Neurology gives him granisetron every few weeks.  Amitriptyline.  ACTH challenge was done by endocrinology.  His most to start a scopolamine patch today and consider holding Bel[place]uca pain meds.  Continue scopolamine patch continue pantoprazole continue Zofran Phenergan follow-up 4 weeks  - 04/23/20 evaluation with Dr. Jodie Echevaria: At last visit held the nighttime TCA and this did not make a difference.  He recently started it back up because of insomnia and this helped.  Held Motegrity for breath test but symptoms recurred so he did restart it and the Motegrity helped.  He was quite nauseous and went to neurologist and was given granisetron IV and another migraine medication a markedly improved.  Still uses Phenergan suppository and only with occasional breakthrough.  Meningioma is thought to have grown again and borderline in size for resection.  He is going  back in a month where he has no cycles.  Continue pantoprazole Zofran promethazine start amitriptyline 25 mg nightly continue Motegrity start Xifaxan 3 times a day for IBS with diarrhea 14 days.  Follow-up 6 weeks  - 03/02/20: eval w Dr. Jodie Echevaria: He is unable to repeat SIBO test due to family  issues.  He stopped monitor antihypertensive due to this.  Nausea remains bad in morning but better in afternoon and evening.  Phenergan as needed.  This does help.  He does think amitriptyline helps sleep.  Bowel movements controlled a "life saver" on Motegrity.  Continue Phenergan Zofran and Compazine stop amitriptyline.  Option of holding TCA for now and seeing if improvement on office to do latter for now.  Continue Motegrity follow-up 3 weeks  - 01/17/20: TCA was decreased due to some orthostasis.  Linzess held and started on [employer].  He feels much better for several reasons.  The dysautonomia and hypotension correlated with nausea.  Coreg was held today no nausea but still some orthostasis but less severe.  Compression stockings and salt recommended.  Recommend start Motegrity follow-up 6 weeks  - 12/20/19: eval w Dr. Jodie Echevaria: He was continued on Linzess TCA antiemetics Protonix at last visit.  He was diagnosed with SIBO and given trial xifaxan.  He did not have much improvement with it.  He continued to have symptoms of nausea and dizziness.  Phenergan suppository.  Some orthostatic hypotension.  He does not have POTS but may be dysautonomia based on male work-up.  He does have adrenal insufficiency.  He continues to complain of loss of appetite fatigue weight loss.  He was somewhat dehydrated but able to rally and drink more.  He was having constipation and nausea so changed the Linzess to Motegrity which he finds helpful.  Continue Zofran twice daily as needed.  Continue Compazine as needed.  He was doing well on TCA but with the orthostasis we will cut down the dose to 20 mg nightly. Stop Xifaxan and linzess - start motegrity. Start phenergan suppository.  Decreased dose TCA increase fluids recommend cardiology visit.  Follow-up 4 weeks  - [facility] / EGD 04/16/19: A few medium mouth diverticula in the sigmoid colon.  The entire colon appeared normal.  Biopsies were taken  EGD Dr Jodie Echevaria 04/16/19: Examined esophagus  normal.  Biopsies taken.  Small hiatal hernia present.  Patchy mild inflammation found in gastric body in gastric antrum. PATH: Normal gastric antral mucosa negative for gastritis H. pylori herpes viral cytopathic changes intestinal metaplasia or dysplasia.  Gastric body mucosa showing mild superficial erosive injury negative for inflammation Helicobacter herpes viral cytopathic changes intestinal metaplasia or dysplasia.  GE junction biopsy: Possible reflux GE mucosa showing chronic inflammation and hyperplastic changes negative for intestinal metaplasia or dysplasia.  Mid esophagus biopsy: Esophageal candidiasis squamous mucosa showing superficial acute inflammation and intraepithelial fungal elements consistent with Candida species   - 12/18/18: eval w GI Dr. Donell Sievert: He looks better than at last visit.  The nausea reflux are better.  He may have fibromyalgia and possibly AI being managed by appropriate specialist.  He continues to struggle with fatigue.  An extensive work-up for LFTs unremarkable besides fatty liver.  He only needs pantoprazole twice a week.  He has Compazine and Phenergan for bad episodes but has not needed it.  He remains on hydrocortisone.  The fatigue continues but manageable.  Trying to get back on Provigil but awaiting approval.  No pain or changes in bowel habits.  Cramps are better.  He added a yogurt shake and fruit and prunes.  Rec continue current GI meds follow-up in 6 months  - 10/09/18 eval w GI Dr. Donell Sievert: At last visit given Phenergan and pantoprazole.  [place] with fatty liver and left renal cyst.  Labs negative HCV HBV.  Neg for other causes of abnl LFT.  He has been seen at [facility] and a nerve biopsy done with nonspecific findings.  He is watching sugar intake.  He reports less nausea less feelings of incapacitation.  He is on new pain med and this works well.  He is on Elavil at bedtime and sleeps better.  He does feel better still fatigue but try to fight through it.  He  is seeing endocrinologist at [facility] and [facility].  Nausea and reflux are better with treatment.  Rec low carbohydrate diet.  Lean protein and low-fat diet.  Gave him names of local endocrinologist and rheum to consult with closer to home.  Follow-up in 3 months         . Aortic valve insufficiency 09/07/2018     Priority: Medium     - ECHO 12/03/20: Conclusions: 1. Normal LV function.   2. Mild aortic valve regurgitation.  3. The proximal ascending aorta is moderately dilated at 4.3  cm.  4. 09/19/2018 echo showed 4 cm ascending aorta size and LVH.  Wall thicknesses are normal on the current study, likely due to interstudy technical variability. Chest CT showed aorta  size to be 4.2 cm 11/24/2020.   - 09/07/18: eval w cardiology Dr. Harlow Asa: recommend echo, continue current antihypertensives, FU 1 year  -ECHO at [facility] 2018: described mild AR and mildly enlarged aortic root LV nl     . IFG (impaired fasting glucose) 02/13/2018     Priority: Medium     - A1C 6.2 as of 11/27/19  - A1C 6.3 09/06/18  - A1C 6.2 02/14/18 FBG 104     . History of insomnia 01/17/2017     Priority: Medium     Chronic, with previous evaluations with multiple providers    11/14/20 eval w Dr. Theodoro Grist: Patient continues to benefit from taking amitriptyline 40 mg at night.  He also alternates between Belsomra as zolpidem at night.  Patient does not mix medications.  Continue current medications.  On rare occasions patient will take modafinil.  Rarely takes clonazepam.  Maintain Toprol and Motegrity.  Follow-up ECG -follow-up in 6 months or sooner as needed  -07/27/20 eval w Dr. Theodoro Grist: He recently recovered from an increased bout of insomnia.  He reports he took Ambien 10 mg for short period to help reset his sleep cycle consider Belsomra in the future.  Follow-up in 4 months or as needed  - 07/12/20: Apt with Dr. Theodoro Grist (sleep) and I am to double Ambien (total 10 mg), Ambien ER 6.25 mg and Belsomra to see which works best. So far doubling Ambien or  Belsomra works best. It's interesting, after a few nights of double Ambien, my brain seems to have reset and I can go to sleep without anything. This is nice since I don't want to use a sleep aid every night. It's amazing how much better I feel with more sleep, particularly REM sleep. I have been using a fit watch to verify the total amount of sleep I get including REM sleep. Dr. Theodoro Grist also gave me a refill for Clonazepam. The last script was 7/27 #30 and I still have 20  left so I am using it sparingly.   - 07/09/20 eval w Dr. Theodoro Grist: Chronic sleeping difficulties in the setting of fibromyalgia chronic pain mood disorder and circadian rhythm disorder.  Symptoms have not been well controlled with combination of melatonin 10 mg and amitriptyline 40 mg and with occasional use of Ambien.  Belsomra was initially considered but his insurance limiting.  He will try Ambien ER 6.25 mg.  Side effects reviewed.  Follow-up 2 weeks  - appears pt has received clonazepam from Dr. Theodoro Grist on 04/10/20, 03/17/20 and 01/27/20  - 02/09/20: eval w Dr. Theodoro Grist: Continues to benefit from taking melatonin and amitriptyline at night.  Occasionally he will take Ambien.  Symptoms well controlled with combination of melatonin and amitriptyline 50 mg at night.  Complete sleep study in future.  Continue modafinil follow-up 5 months  - Feb1, 2021: Eval with Dr. Eilleen Kempf: Sx well controlled with combination melatonin 10 mg and amitriptyline 50 mg at night.  Complete sleep study once pandemic has subsided.  Continue modafinil follow-up 6 months  - now seeing CBT therapist Dr. Lanier Ensign  - 10/31/16: eval w sleep medicine Dr. Ala Dach  [facility]): reviewed records from sleep study Sept 2017 and CPAP in Oct 2017 ... My opinion  his OSA is not sig enough to require effort on using CPAP consistently ... Particularly since he reports not having found CPAP helpful or restful, best to set it aside and at some point another sleep lab study and titration may be  helpful.      . Growth hormone deficiency 03/29/2016     Priority: Medium     - Jan 2019: eval w Dr. Madie Reno [facility]) see recs under adrenal insufficiency in problem list  -- 10/31/16: eval w Endocrine Dr. Tawanna Cooler [facility]): increase GH to 0.4 mg daily  - 08/26/16:  eval w Dr. Tawanna Cooler [facility]): suspect that these issues will improve longer he is on [facility] .Marland KitchenMarland Kitchen Therefore am not concerned with him taking 7.5 mg -10 mg of pred a day or equiv if that is what he needs in next several months.          . History of meningioma of the brain 11/09/2015     Priority: Medium     12/09/02: s/p R parieto-occipital parasagittal craniotomy for 85-90% resection of R pareito-occipital falx meningioma that originally measured 61 x 24 mm    - 07/27/20: eval w Dr Nilda Calamity: His meningioma is stable and is asymptomatic.  He has chronic pain depression and insomnia.  He is doing much better recently as his nausea resolved presumably because he weaned his Belbuca and his insomnia and fatigue improved with Ambien and Belsomra alternating nightly.  He is not experiencing headaches.  His meningioma is stable but he should have a repeat study in 1 year.  I will see him after that  - 04/29/20: eval w Dr. Nilda Calamity: His meningioma seems to be progressive in size over the past year.  In my estimation although this is not noted on the report.  Is asymptomatic.  I would like him to get a repeat scan in about 6 months to ensure there is no acceleration in growth.  His neuro symptoms of nausea are of uncertain etiology.  I reiterated that the meningioma is not causing any nausea or dizziness.  Is a chronic occluded superior sagittal sinus which is not likely to be true triggering symptoms either but is relevant so I would like him to have an evaluation  from ophthalmology to exclude increased intracranial pressure.  He had a GI work-up that was negative.  He is not endorsing headaches or neck pain but he does have severe trigger point in the right shoulder  girdle that could be triggering some nausea potentially.  Advised him to get a trigger point injection for that.  Would be reasonable for him to try Emgality as a migraine prophylactic with very little downside given his intractable symptoms currently.  Follow-up in about 6 weeks  - 03/06/20: eval w Dr. Nilda Calamity: He is due for MRI for meningioma which does border on posterior cingulate cortex which is important in memory function some some concern about clinical effects of tumor progression.  His meningioma seems to be progressive in size over past year in my estimation although this is not noted on the report.  It is asymptomatic.  I would like to get a repeat scan in 6 months.  Follow-up after testing complete  - MRI 12/11/19: IMP:   Stable posterior parafalcine meningioma with continued segmental occlusion of the superior sagittal sinus.  - 09/02/19: eval w Dr. Nilda Calamity (neurology): He is due for MRI for meningioma which does border on posterior cingulate cortex is important in memory function.  Order for MRI provided if this normal I would rec repeat study in a year.  If abnormal would have him schedule for follow-up to discuss further  - 09/03/18: MRI: IMP: Modest increase in residual left parafalcine tumor volume noted when compared back to 2018 but wo significant change when compared to 2019.  - 09/06/17: electronic eval w [name] ([facility]): Dr. Rosanne Ashing residual occipital parasagittal meningioma appears stable between 2017 - 2019 though it enlarged a little between 2008 - 2017. It is now calcifying, suggesting that further growth unlikely. I do not think it is responsible for ongoing symptoms. I would suggest follow up MRI in 2 years.         . Chronic back pain 11/09/2015     Priority: Medium     - 07/12/20 pt email: Today I had an appointment with the pain clinic. I have weaned down to 300 mcg Belbuca QD from 450 mcg BID. I have no nausea and this controls the chronic pain. They would prefer that  I take 150 mcg BID since the half life is 12 hours and it is always more difficult to catch up with the pain. I am still trying to wean down to the lowest dose that controls the pain.   - eval w [facility] 12/02/19: Managing with Belbuca which he reports improving pain significantly.  Since initiating Belbuca pain is a 3-4.  He has been able to [place] all other meds including tramadol and gabapentin.  - Eval with [facility] 07/12/17: Rec tramadol for severe breakthrough pain.  MRI of cervical spine ordered.  Continue at-home exercises and stretches and physical therapy.  Follow-up in 4 weeks for further evaluation  - As of May 2018 pt reports he gets pain medication and klonazepam from [facility]  - 07/01/16: note from Dr. Toni Amend:  I will no longer refill percocet, clonazepam, or provigil as no disease indications. I advised he will need  pain specialist provide. Pt has shown no e/o abuse, but has been on meds for years w no benefit. Pt currently being tx for panhypopit and AI, hypogonadism, and hypot GH deficiency. The pt knows to wean clonazepam and asked for 30 days of tramadol to wean from percocet. Note  he is only on a half tablet of percocet qid. No refills will be provided. Pt aware.   - 2005: developed R hip and leg pain and was told he had "small central disk herniation" at L5-S1. Surgery not rec'd. He received PT and some epidural corticosteroid injections which improved but did not completely resolve situation. Low back and leg pain recurred in 2006 and was again tx w steroid and PT.      . Essential hypertension 01/24/2013     Priority: Medium     Treated with amlodipine and ramipril and toprol     . Mixed hyperlipidemia 01/24/2013     Priority: Medium     Pt reports he has taken statin for many years (was taken off to see if this would help with fibromyalgia pain / wo improvement) and zetia and tricor. As of June 2019 not on tricor (concern with zetia and tricor contributing to risk of  myopathy)    TC 177 TRI 237 HDL 54 LDL 76 02/14/18  TC 540 TRI 112 HDL 81 LDL 86 09/14/16          . Primary hypothyroidism 01/24/2013     Priority: Medium     Check FT4 in addition to TSH for him    - 11/19/20: Nl FT4  - 11/25/20 pt email: I stopped cytomel this AM and symptoms of tachycardia and SOB on mild exertion resolving. Think I should avoid all T3 in future? Questioning how much levothyroxine now. Waiting for response from [facility]  - 09/22/20 FT4 wnl on current regimen  - 07/13/20 pt email: Had apt. with endo - to decrease Levothyroxin to 125 mg and add Cytomel 5 mg. The added T3 may help me get started in morning. Will do this for a month and then check levels. ACTH challenge test to be repeated.    - FT4 wnl as of 11/28/19  - FT4 7.4 wnl as of 09/06/18  - 03/12/18: email from endocrin Dr. Madie Reno at [facility]: " regarding thyroid replacement dose. TSH not reliable. All you really need in addition is FT4 which he states normal.. You values here in past have been around 1.5. If it is still in this range I would not change dose. Re testosterone, it would be good to get free testosterone or bioavailable testosterone along with total to be sure dose is correct. Regarding GH, if you have only been off of it for a week or two, you can start back on same dose. If it has been longer you should probably start at 0.2 mg/day for 2 weeks then increase to 0.3 mg.        . History of asthma 01/24/2013     Priority: Medium     - since childhood    - eval w Dr. Myles Gip 04/25/2019: Continue current medications asthma under good control Symbicort twice daily plus Singulair  - 11/23/16: eval w Dr. Renita Papa ([facility] at [facility]): bronchial asthma mild, allergic rhinitis postnasal drainage: uses albuterol BID for chest tightness. Check nitric oxide test. He has had intermittent eosinophilia and this points towards an eosinophilic asthma phenotype. He does have allergic rhinitis and think he would be better servied with our triple combination nose  spray which includes mometasone, diphenhydramine and iptratroprium. I gave him rx for this.      . Basal cell carcinoma (BCC) of skin of nose 01/29/2018     Priority: Low     Dx in June 2019 recommended for MOHS in  July 2019     - eval w Dr. Rickard Patience 01/02/18: s/p biopsy L nasal area     . Spondylosis of cervical region without myelopathy or radiculopathy 08/09/2017     Priority: Low     - MRI C Spine w/o contrast: C2-C3: Small posterior disc bulge partially effacing the ventral CSF and gently indenting the ventral cervical spinal cord.  There is no underlying cord signal abnormality.  There is no central canal nor is there foraminal narrowing.  C3-C4: There is a disc osteophyte complex with a superimposed posterior central disc protrusion which partially effaces the ventral CSF and indents the ventral cervical spinal cord.  There is no central canal stenosis or foraminal narrowing.  C4-C5: There is a disc osteophyte complex with prominent posterior lateral disc component within the foramina bilaterally, right greater than left.  Posterior disc bulge partially effaces the ventral CSF and indents the right ventral cervical spinal cord.  There is mild central canal stenosis.  There is no foraminal narrowing.  C5-C6: There is disc space narrowing and disc desiccation.  There is a disc osteophyte complex with prominent uncovertebral hypertrophy, right greater than left.  Posterior disc bulge partially effaces the ventral CSF.  There is no central canal stenosis.  There is mild to moderate right-sided foraminal encroachment.  The left foramen is patent.  C6-C7: There is disc space narrowing and disc desiccation.  There is a disc osteophyte complex with right posterior lateral disc herniation partially effacing the ventral CSF and mildly flattening.  The underlying cervical spinal cord.  There is marked right-sided foraminal encroachment as well as mild left-sided foraminal narrowing.  There is facet arthropathy.   C7-T1: There is no evidence of central canal or foraminal stenosis.  Overall impression: Multilevel cervical spondylosis as described in detail above.  - evaluation w [facility] Dr. Penni Bombard Nov 2018     . Amnestic MCI (mild cognitive impairment with memory loss) 11/20/2016     Priority: Low     - had dizziness on donepezil and remeron - Fallis'd both -     - 04/17/19: eval w Neurology Dr. Nilda Calamity: Multiple medical problems and reduced quality of life due to chronic pain insomnia and fatigue and memory concerns.  He would like to try a new possible tx for chronic fatigue and fibromyalgia involving treatment of possible viral pathogens.  I told him I am not comfortable treating him this way with famciclovir and Celebrex but I rec he see another physician interest in fibromyalgia and chronic fatigue as well as possible viral connections with conditions.  Referral made.  I will continue to follow for memory concerns and meningioma.  I have no suspicion for Alzheimer's.  The meningioma does border on posterior cingulate cortex which is important in memory function and will be monitored periodically.  He sees Dr. Theodoro Grist for insomnia and daytime sleepiness.  I will see him again virtually in a few months  - 11/27/18: eval w Neurology Dr. Nilda Calamity: Multiple medical problems and reduced quality of life due to chronic pain insomnia and fatigue with more recent concerns about memory.  At this point based on history and exam as well as review of records I have no reason to suspect Alzheimer's type dementia.  The meningioma is bordering on aspects of the posterior cingulate cortex which is important in memory function so this is potentially a cause.  However I am skeptical of the MCI diagnosis and I would think that abbreviated follow-up  testing is important.  He has severe daytime fatigue and sleepiness and unrefreshing sleep.  He is seeing Dr. Theodoro Grist for further advice on this.  I will prescribe Provigil for his daytime fatigue  as I intended to previously.  His meningioma needs continual follow-up.  Although there was no obvious growth with the recent exam I did feel that the meningioma seemed a bit more robust and greater in volume than it did in 2017 but this may have been due to the greater degree of gadolinium uptake which was probably a technical issue.  His understanding was that the meningioma was calcified and unlikely to grow.       . Hemianopia, homonymous, left 09/15/2015     Priority: Low     - 09/04/17: eval w Dr. Jerre Simon [facility]): He has a L homonymous hemianopia from prior occipital lobe meningioma and resection. I will get baseline visual field since last exam 2 years ago. He reports dry eye. Prior SSA and SSB were negative. Schirmer's test showed normal tear production. The dry eye is mostly from meibomian gland dysfunction and therefore unrelated to Sjogren's. His main visual complaint is blurred vision and tunnel vision during "spells" that he attributes to low cortisol. He is currently being tx with cortisone and has minimal sx. A lack of cortisone would not typically cause visual sx, but if blood pressure dropping, this could be contributing. He is scheduled for autonomic testing later this week. Addendum: 24-2 Humphrey visual field showed a small left inferior homonymous scotoma     . Secondary male hypogonadism 01/24/2013     Priority: Low     - - 10/31/16: eval w Endocrine Dr. Tawanna Cooler [facility]): testosterone level is excellent right now - continue current therapy         . Dysautonomia 12/15/2020   . Disequilibrium 12/08/2020   . Elevated ALT measurement 09/19/2020     ALT 53 as of 09/18/20     . Trapezius strain, right, initial encounter 07/09/2020     06/05/20: Based on his clinical presentation and MRI findings a strain of the trapezius is suggested.  I recommended a trial of physical therapy initially rather than doing an MRI of the chest wall.  He is amenable to this.   - Ambulatory referral to Physical  Therapy     . Chronic narcotic dependence 06/05/2020   . Meningioma 06/05/2020   . Chronic kidney disease, stage III (moderate) 05/14/2020   . Asthma 09/04/2018   . Onychomycosis 02/13/2018   . Xerostomia 12/21/2017     eval w dentist in April 2019 and recommended for tx w evoxac     . Left testicular pain 09/13/2017     09/07/17 evaluation w Urology at [facility]: PLAN: 1 Left Testicular Pain We discussed current symptomatology. We discussed results of ultrasound which does not note concerning pathology to account for left-sided testicular pain. He has noted improvement with use of scrotal support. I did discuss with him consideration of referral to our PMR colleagues, potential PT for pelvic floor assessment or question as to whether nerve block would be appropriate. At this point in time the patient would like to defer anything invasive and is reassured with negative imaging.             . Recurrent sinusitis 01/17/2017     - Pt email 09/18/20:  Mometasone / ipratroprium / diphenhydramine nose spray was rx by ENT during my first trip to [facility]. At the time I felt  obstructed on left side. The ENT used a scope and said only findings were a deviated septum and congestion. I have had a chronic problem with sinusitis usually requiring antibiotics from time to time. Since using nose spray, no antibiotics needed. I also do a sinus rinse several times a day. Otherwise my nose gets really congested and contributes to insomnia.   - 11/21/16: eval w ENT Dr. Jannette Spanner [facility]): For recurrent sinusitis, he is on good medical regiment w daily nasal irrigations. Surgery would be indicated for 4-5 episodes of sinusitis per year. He was not interested in operative intervention and this is reasonable. In re to his deviated septum septoplasty could be considered. He was not interested at this time. To address his nasal obstruction on a mucosal basis I recommended triple spray. Avoid afrin. Offered him fluoroscopic eval of his dysphagia but he  deferred. Would not recommend surgery for OSA.      [name] BPH (benign prostatic hyperplasia) 01/24/2013     As of 11/13/19 pt noting diminished urinary flow - req trial of flomax 0.4 mg qhs -        The patient's past medical history, surgical history, medications and allergies were reviewed.    Allergies   Allergen Reactions   . Bextra [Valdecoxib] Rash       Medications  Current Outpatient Medications   Medication Sig Dispense Refill   . albuterol sulfate HFA (ProAir HFA) 108 (90 Base) MCG/ACT inhaler INHALE ONE PUFF BY MOUTH EVERY FOUR TO SIX HOURS AS NEEDED FOR WHEEZING 25.5 g 1   . AMITRIPTYLINE HCL PO Take 40 mg by mouth nightly        . amLODIPine (NORVASC) 5 MG tablet Take 1 tablet (5 mg total) by mouth 2 (two) times daily 180 tablet 3   . buprenorphine (BELBUCA) 150 MCG per buccal film Place inside cheek     . clonazePAM (KlonoPIN) 0.5 MG tablet Take 1 tablet (0.5 mg total) by mouth daily as needed for Anxiety 30 tablet 0   . ezetimibe (ZETIA) 10 MG tablet Take 1 tablet (10 mg total) by mouth daily 90 tablet 3   . finasteride (PROSCAR) 5 MG tablet Take 1 tablet (5 mg total) by mouth nightly 90 tablet 3   . hydrocortisone (CORTEF) 5 MG tablet Take 15 mg in am, 5 mg in early afternoon and 5 mg at 7 pm. Allow for extra doses in case of stress, illness, fever. 600 tablet 2   . levothyroxine (SYNTHROID) 125 MCG tablet Take 125 mcg by mouth Once a day at 6:00am     . metoprolol succinate XL (Toprol XL) 25 MG 24 hr tablet Take 1 tablet (25 mg total) by mouth nightly 90 tablet 3   . montelukast (SINGULAIR) 10 MG tablet TAKE 1 TABLET BY MOUTH EVERY NIGHT AT BEDTIME AS DIRECTED 90 tablet 3   . Motegrity 2 MG Tab Take 1 tablet by mouth daily     . Probiotic Product (ALIGN PO) Take 1 tablet by mouth daily     . promethazine (PHENERGAN) 25 MG suppository      . ramipril (ALTACE) 10 MG capsule Take 1 capsule (10 mg total) by mouth daily (Patient taking differently: Take 20 mg by mouth daily) 90 capsule 3   . senna (SENOKOT)  8.6 MG tablet Take 1 tablet by mouth 2 (two) times daily        . simvastatin (ZOCOR) 20 MG tablet Take 1 tablet (20 mg total) by mouth  nightly 90 tablet 3   . SYMBICORT 80-4.5 MCG/ACT inhaler Inhale 2 puffs into the lungs two times daily (Patient taking differently: 2 puffs 2 (two) times daily Have patient rinse mouth after use) 10.2 g 3   . tamsulosin (FLOMAX) 0.4 MG Cap Take 1 capsule (0.4 mg total) by mouth Daily after dinner 90 capsule 3   . Testosterone 20.25 MG/ACT (1.62%) Gel Apply 4 actuations daily (2 on each shoulder) 300 g 2   . zolpidem (AMBIEN CR) 6.25 MG CR tablet        No current facility-administered medications for this visit.     Review of Systems  CONST: 8 pound weight loss since January, no new fevers/chills sweats, new fatigue, muscle aches  CV: No new chest pain, palpitations, leg swelling  RESP: No new worsening shortness of breath, cough, wheeze, asthma  NEURO:  No new headache, lightheadedness, dizziness, loss of consciousness, numbness/tingling, weakness    Physical Exam  BP 138/80 (BP Site: Left arm, Patient Position: Sitting, Cuff Size: Medium)   Pulse 92   Temp 97.9 F (36.6 C) (Temporal)   Resp 14   SpO2 97%   Wt Readings from Last 3 Encounters:   11/27/20 80.7 kg (178 lb)   11/19/20 81.5 kg (179 lb 10.8 oz)   09/03/20 84.6 kg (186 lb 9.6 oz)     GEN: Well developed well nourished male alert and appropriate in no apparent distress  HENT: Tympanic membrane nl bilat without excessive wax,  oropharynx normal w no exudate or erythema  EYES: PERRL,  extra ocular movements intact, no pallor or scleral icterus, normal conjunctiva  NECK: supple, no thyromegaly or nodules appreciated  LYMPH: no cervical or supraclavicular lymphadenopathy appreciated  CV: regular rate and rhythm no m/r/g.  No lower extremity edema.  2+ radial pulses present and equal.  RESP: clear to auscultation bilaterally, normal effort, no rhonchi or rales    Assessment/Plan    1. Pressure sensation in left ear  No  evidence of wax.  Avoid using Cerumenex.  Recommend observation for now    Gloriajean Dell, MD  [facility]  [address]  [place], [place] [address]  P) [phone]  F) [phone]  [email or web address]

## 2020-12-27 NOTE — Progress Notes (Signed)
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 LAst visit was in August 2021. H/O hypopituitarism presumably secondary to brain surgery for meningioma. I reviewed the notes, imaging reports and labs in EPIC. He is accompanied by his spouse.   He was also seen in the past at the endocrine department at the [facility] clinic.    In the past, he has been on quite high doses of hydrocortisone (HC) at time (300+ mg daily) to treat symptoms of nausea, extreme fatigue, and dizziness. He was able to taper the dose of HC and wants to know if it can be discontinued. He is current taking a base dose of 20 mg in am.  He uses higher doses if he expereinces the above symptoms. In that past that approach helped but no much recently.      He is also using topical testosterone for hypogonadism.      He also takes levothyroxine for hypothyroidism.  Fatigue and dizziness have bene the dominant symptoms.    Active Problems        Patient Active Problem List    Diagnosis Date Noted   . History of meningioma of the brain 11/09/2015     Priority: High     12/09/02: s/p R parieto-occipital parasagittal craniotomy for 85-90% resection of R pareito-occipital falx meningioma that originally measured 61 x 24 mm    - 10/27/16: eval w [name] (Neurology - [facility]): agree w [name] that lesion is stable and that calcification should be reassuring . I recommended he remain under imaging surveillance. I have told him that it is very unlikely that there is a dysautonomia to account for his sx when upright, but I personally like to be sure that we have no evidence for that. He will be seen in Sleep Medicine and I endorse that as well.   - 07/04/16: eval w [name] (Neurosurgery -[facility]): stable both clinically and on imaging w regard to his now calcified residual parasagittal meningioma. ... Now that it is calcified, I am not convinced there is any change between  Jan and Oct of this year. I think continued observation would be very reasonable. Recommend FU MRI in one year.   - eval w [name], Neurosurgery [facility] - rec FU 1 year  - MRI Brain w/wo contrast 06/07/16: Again seen is a L posterior parafalcine meningioma. There is a slightly greater measurement in the cephalocaudal plane which may be related to technique or minimal tumor progression. Progressive opacification of the L mastoid air cells. Otherwise stable exam  - 10/13/16: letter from [name]: Neurosurgery at [facility]: "CT scan does show dents of calcification.Marland KitchenMarland KitchenAs we discussed in clinic I think it is unlikely that ongoing sx are related to meningioma .[name] I would continue to follow w MRI scan in one year.   - 10/07/15: eval w [name], Neurosurgery at [facility]: "number of nonspecific sx. I am not certain that they are related to any enlargement of his residual meningioma and certainly would not expect them to be improved by any treatment of his residual meningioma. With that in minds I think the question to establish is whether or not this residual is in fact enlarging. ... I suspect that this lesion is stable and in fact calcified. If that were the case, observation with another MRI scan in a year may be very reasonable. Alternatively we could consider either gamma knife radiosurgery  or craniotomy and resection. The lesion is at the UL for size for what we could treat w gamma knife, but I think it could be accomplished in one or perhaps two staged procedures. However I would only recommend this if the lesion was clearly enlarging and I am not certain of this at the present time. Recommend CT scan of head to confirm that lesion is calcified. I will be in touch with him after this is available.   - 10/06/15: eval w Neurology [name]): I have told Dr. Lacretia Nicks that I really cannot give him any advise until I review outside pathology and prior films .[name] Pt requested appt in Fibromyalgia/Chronic fatigue  clinic as well as Endo.   - 09/16/15 eval w [name], Neurosurgery [facility]: I do not believe any of the sx he is having now are related to the tumor. By report he did not have papilledema in [name] exam. I recommended however that he undergo CyberKnife radiosurgery. Pt will call my office to set up RadOnc consult and will proceed w CyberKnife tx.      . Chronic fatigue fibromyalgia syndrome 01/24/2013     Priority: High     In Aug 2006 hd noted spontaneous onset of severe fatigue and myalgias. He had extensive evaluation for CT and autoimmune disease. Ultimately he was discovered to have inadequate thyroid replacement as well as a low free testosterone. He was evaluated by endocrine at that time. ... Ultimately he was diagnosed with fibromyalgia and chronic fatigue syndrome for which he was prescribed various medications including provigil, cymbalta, gabapentin and pregabalin, and elavil (all w/o improvement). \    - Pt email 07/11/17: I have an appointment to see Dr. Penni Bombard on Wednesday, November 21st @1 :30.  - 06/22/17: Today the patient is mainly here to talk about his chronic pain. He carries a diagnosis from previous providers of chronic pain and fibromyalgia syndrome. He has been extensively evaluated in the past for this and other conditions by the [facility]. He has another annual examination with [facility] coming up in the next 5 months. He notes that lately he has had more difficulty with fatigue, generalized muscle pain and "brain fog." He has submitted routine blood work to the [facility] to monitor his adrenal insufficiency and hypothyroidism and these are reportedly stable. He feels like sometimes he feels "terrible" and has difficulty leaving the house due to pain and fatigue. He has tried a wide variety of medications in the past to help with fibromyalgia without specific improvement. These are summarized below. 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 Currently he takes tramadol 25-50 mg once or twice daily and this helps but does not entirely alleviate his pain. Pain is worse in the morning, improves somewhat through the day. Fatigue is always present. He was recommended by his psychiatrist and neurologist at [facility] to try Remeron and donepezil. He did not tolerate these. He wonders about additional treatment options. Currently he feels like insomnia is better but fatigue and pain are worse. Despite his extensive workup with many specialists in the past at [facility] he wonders if there is still some occult cause of his symptoms which has not been identified.  - 10/08/15: note from [name] [facility] Fibromyalgia and Chronic Fatigue): presentation is c/w fibromyalgia and chronic fatigue. Advised to complete  Fibro/CFS treatment program  - 10/06/15 Note from Neurology Dr. Raynelle Fanning [facility]: In Aug 2006 noted spontaneous onset of severe fatigue and myalgias. He had extensive evaluation for CT and autoimmune disease. Ultimately he was discovered to have inadequate thyroid replacement as well as low free testosterone. He was eval by endocrine ... Ultimately he was diagnosed with fibromyalgia and chronic fatigue syndrome for which he was prescribed various medications including provigil, cymbalta, gabapentin and pregabalin.      [name] History of insomnia 01/17/2017     Priority: Medium     - 10/31/16: eval w Endocrine Dr. Tawanna Cooler [place] [facility]): I believe this is a major contributor - we did have him do overnight oximetry with using CPAP and this did show continued desaturations that were very long periods of time when he was up and not able to fall asleep - he is seeing the sleep center      . Secondary adrenal insufficiency 01/17/2017     Priority: Medium     - 11/23/16: eval w Endocrine Dr. Tawanna Cooler [place] [facility]): due for reassessment of FT4 and IGF 1 in 4  weeks after changing dose on 10/27/16. Mail in specimen. Pursue recs for insomnia from psych. MRI did not show signs of dementia. PFT nl. FU 6-12 mos  - 10/24/16: eval w Endocrine Dr. [place]awanna [place]er [place] [facility]): despite adequate replacement w levothyroxine, GH and hydrocortisone he still has significant fatigue, although there has been a definite benefit from the hormone treatment. Levothyroxine dose is a little high given the slightly low TSH and this is not uncommon to see replacement dose requirements decrease for thyroid replacement when [facility] is initiated. We are waiting on reports on IGF1 and testosterone.      . Mood disorder secondary to multiple medical problems 01/17/2017     Priority: Medium     - 12/01/16 eval w psychiatry at [facility] Dr. James Ivanoff: "complicated situation. Recommend trying remeron 15 mg to help w sleep, mood and anxiety. ... If his sleep can be improved that could potentally improve his cognition. Try it for at least 6-8 weeks. He can let me know how he is doing via patient portal. I also recommended that he try to simplify his medication and situations, meaning not using klonopin or percocet. I cautioned him on marijuana use. Recommend continuing w cognitive behavioral therapy and psychotherapy techniques.      . Amnestic MCI (mild cognitive impairment with memory loss) 11/20/2016     Priority: Medium     - had dizziness on donepezil and remeron - Engelhard'd both - as of 01/18/17 - on half tab of donepezil 10 mg  - 11/24/16: eval w Neurology Dr. Betsey Holiday [facility]): He does have insomnia, and remeron just commenced which makes good sense. He may also benefit from cholinergic stimulation and therefore donepezil may be helpful here. .. We discussed potential role of amyloid PET testing. I will arrange an amyvid PET scan. If negative, early AD is effectively ruled out. Consider brain rehab program. Meet back after PET testing.   - 10/31/16: eval w Endocrine Dr. Eyvonne Mechanic [facility]): Dr. Earney Hamburg  completed neuropsychologic testing and incidates he has very focal impairment in verbal memory and to a lesser extent in visual memory and indicates this is consistent with the amnestic variety of mild cognitive impairment. Neurology is arranging for further testing with MRI for cognitive dysfunction and brain PET scan.   - 10/28/16 eval w Dr Azucena Kuba [facility]): impaired performance in several measures  of verbal learning and memory and some marginally poor performances in measures of nonverbal learning and memory. Marland KitchenMarland KitchenMarland KitchenThese results are significant and while neuropsychological testing in and of itself cannot diagnose any particular condition you would potentially benefit from further discussion, possibly with our colleagues in Neurology to determine if there is some underlying condition that may be contributing to these memory difficulties even beyond your history of brain tumor.      . Growth hormone deficiency 03/29/2016     Priority: Medium     - 10/31/16: eval w Endocrine Dr. Tawanna Cooler [place] [facility]): increase GH to 0.4 mg daily  - 08/26/16: eval w Dr. Eyvonne Mechanic [facility] Endocrine): At this point suspect that tsome of these issues will improve the longer he is on [facility] .Marland KitchenMarland Kitchen Therefore I am not concerned with him taking 7.5 mg to 10 mg of prednisone a day or its equivalent if that is what he needs in the next several months.   - 07/11/16: eval w Dr. Tawanna Cooler [place] [facility] Endocrine): he did have very + response to hydrocortisone replacement .[name] However he would lose effect after 4 hours. .. We have talked about options and could try prednisone with longer half life - swtch to t mg in AM and 1 mg between 3-5 pm and adjust as needed. He has been on finasteride for many years and give trial off of this. New rx for androgel packets. Thyroid function is excellent.   - 12/10/15: eval w Dr. Tawanna Cooler [place] [facility] Endocrine): no response to [facility] after glucagon stimulation. He does have some sx that could be explained by  partial adrenal insufficiency ... I would like to proceed with a trial of glucocorticoid replacement now prior to starting [facility] to see if he has the typical response we would see w cortisol replacement. ... If his s do not improve w hydrocortisone in replacement doses this can be [place]. I think however we still would need to follow this given the unclear nature of the underlying pituitary process and the possibility that deficits may develop as time goes on.   - 10/08/15: note from [name] [facility] Fibromyalgia and Chronic Fatigue): presentation is c/w fibromyalgia and chronic fatigue. Advised to complete Fibro/CFS treatment program  - 10/07/15: eval w Dr. Eyvonne Mechanic [facility] Endocrine): Await testosterone levels and advise on adjustments ... We do not need to worry about adrenal insufficiency. However he has multiple sx which are c/w growth hormone deficiency and I think this is worth defining. I will do glucagon stimulation test - if this confirms GH deficiency I would strongly recommend replacement which hopefully will improve at least some of his sx particularly a component of his fatigue, his social isolation, his decreased ambition and altered body composition. I will see him after testing is complete.      . Essential hypertension 01/24/2013     Priority: Medium     - eval w Dr. Ova Freshwater (Nephor/HTN at [facility]): BP seems better, monitor home readings - not having significant autonomic dysfunction  -ECHO 10/27/16 [facility]: nl LV size and systolic function w EF of 65% - mild AV regurgitation and mild ascending aortic dilatation - recommend repeat ECHO in 12 months  - 10/26/16: eval w Dr. Ova Freshwater (Nephro/HTN at [facility]): recommend change amlodipine to 10 mg from 5 mg and take at night. Continue ramipril 20 mg. I would hesitate to start clonidine given dry eyes and dry mouth. I would hesitate to put him on diuretic given the  adrenal insufficiency. I would therefore lead toward putting him on something  like low dose alpha blocker like carvedilol. ... He is agreeable to try this. We will follow up next month.   - 10/25/16: eval w Dr. Allen Norris (Cardiology): mild AV regurge based on outside echo, repeat echo. Borderline mid ascending aortic dilatation. Recommend ECHO in 12 months for FU. Lipid profile appears appropriate     . Mixed hyperlipidemia 01/24/2013     Priority: Medium     TC 189 TRI 112 HDL 81 LDL 86 09/14/16 -     Pt reports he has statin for many years (was taken off to see if this would help with fibromyalgia pain wo improvement) and zetia and tricor     . Primary hypothyroidism 01/24/2013     Priority: Medium     - reports TSH in Oct 2018 from [facility] reportedly normal  - TSH 0.436 as of 12/20/16 FT4 wnl  - 10/31/16: eval w Endocrine Dr. Tawanna Cooler [place] [facility]): FT4 mildly elevated on 200 mcg day - decrease to 150 mcg daily and recheck FT4 in 8 weeks       . Hemianopia, homonymous, left 09/15/2015     Priority: Low     - evaluation w Ophthalmology Dr. Ferd Glassing /24/17: this patient continues to do exceptionally well almost 13 years following resection of a right parietooccipital falcine meningioma. Prior to surgery, he had an inferior left homonymous defect that improved following surgery and has subsequently stabilized with minimal residual.Even though he has a moderate residual lesion abutting and occluding the posterior sagittal sinus, with slight compression of the medial aspect of the left occipital lobe, his examin today remains absolutely stable with no evidence of a worsening field defect and no evidence of papilledema or optic atrophy. He has a stable nevus in the right fundus. I will see him again in 1-2 years. In meantime, he has asked for a suggestion of an endocrinologist who deals with pituitary hormone dysfunction and he also may be interested in seeing a radiation therapist for discussion of stereotactic radiosurgery using a gamma knife (as opposed to the Cyberknife). I  have recommended that he discuss these issues with [name] when he sees him tomorrow.     . Secondary male hypogonadism 01/24/2013     Priority: Low     - - 10/31/16: eval w Endocrine Dr. Tawanna Cooler [place] [facility]): testosterone level is excellent right now - continue current therapy         . Spondylosis of cervical region without myelopathy or radiculopathy 08/09/2017     - MRI C Spine w/o contrast: C2-C3: Small posterior disc bulge partially effacing the ventral CSF and gently indenting the ventral cervical spinal cord. There is no underlying cord signal abnormality. There is no central canal nor is there foraminal narrowing. C3-C4: There is a disc osteophyte complex with a superimposed posterior central disc protrusion which partially effaces the ventral CSF and indents the ventral cervical spinal cord. There is no central canal stenosis or foraminal narrowing. C4-C5: There is a disc osteophyte complex with prominent posterior lateral disc component within the foramina bilaterally, right greater than left. Posterior disc bulge partially effaces the ventral CSF and indents the right ventral cervical spinal cord. There is mild central canal stenosis. There is no foraminal narrowing. C5-C6: There is disc space narrowing and disc desiccation. There is a disc osteophyte complex with prominent uncovertebral hypertrophy, right greater than left. Posterior disc bulge partially effaces the ventral CSF.  There is no central canal stenosis. There is mild to moderate right-sided foraminal encroachment. The left foramen is patent. C6-C7: There is disc space narrowing and disc desiccation. There is a disc osteophyte complex with right posterior lateral disc herniation partially effacing the ventral CSF and mildly flattening. The underlying cervical spinal cord. There is marked right-sided foraminal encroachment as well as mild left-sided foraminal narrowing. There is facet arthropathy. C7-T1: There  is no evidence of central canal or foraminal stenosis. Overall impression: Multilevel cervical spondylosis as described in detail above.  - evaluation [facility]. Penni Bombard Nov 2018     . History of herniated intervertebral disc 01/17/2017     2005: developed R hip and leg pain and was told he had "small central disk herniation" at L5-S1. Surgery was not recommended. He received PT and some epidural corticosteroid injections which improved but did not completely resolve the situation. Low back and leg pain recurred in 2006 and was again tx w steroid and PT.      . Mild OSA  01/17/2017     - 10/31/16: eval w sleep medicine Dr. Ala Dach [facility]): reviewed original records from sleep study in Sept 2017 and CPAP trial in Oct 2017 ... My opinion at this time his OSA is not sig enough to require a focused effort on using his CPAP consistently ... Particularly since he reports not having found CPAP to be helpful or restful, I think it is best to set it aside for the time being and at some point another sleep lab study and titration may be helpful. ... Depending on comorbidities this degree of OSA is worth an effort at treating but when there has not been any consistent benefit even in the past when he tried it consistently, then it is reasonable to put on hold for the time being.      . Chronic diarrhea 01/17/2017     - eval w ID 11/21/16 Dr. Marcy Panning [facility]): has had extensive evaluation. I cannot identify an infectious disease that is likely to cause symptomatology. As he does have some cognitive decline and chronic diarrhea I will be checking a whipple PCR on blood although I think this is highly unlikely as he does not also have joint symptoms. A duodenal aspirate would be needed to confirm or completely rule out this diagnosis. I will also be checking for heavy metals. Given the amount of fish he consumes I am expecting to have high levels organic arsenic which are not toxic. . I will  also be testing for stronglyloides which can be found in [place] and [place]. I will call if any of these tests are positive.      . Recurrent sinusitis 01/17/2017     - 11/21/16: eval w ENT Dr. Jannette Spanner [facility]): For recurrent sinusitis, he is on good medical regiment w daily nasal irrigations. Surgery would be indicated for 4-5 episodes of sinusitis per year. He was not interested in operative intervention and this is reasonable. In re to his deviated septum septoplasty could be considered. He was not interested at this time. To address his nasal obstruction on a mucosal basis I recommended triple spray. Avoid afrin. Offered him fluoroscopic eval of his dysphagia but he deferred. Would not recommend surgery for OSA.      [name] Panhypopituitarism 03/29/2016   . Chronic, continuous use of opioids 11/09/2015     - As of May 2018 pt reports he gets his pain medication and klonazepam  from [facility]  - 07/01/16: note from [facility] Dr. Toni Amend: Pt informed I will no longer refill percocet, clonazepam, or provigil as there are no clear disease indications. Pt upset and feels he needs these meds for his CF/FMS. I advised pt that he will need to have either rheum or pain specialist provide if they feel they are indicated. Pt has shown no evidence of abuse, but also has been on meds for several years and has shown no benefit in my opinion. Pt is currently being treated for panhypopit and adrenal insuff, hypogonadism, and hypothyroidism as a result, as wellas, growth hormone deficiency. Further analysis suggests polypharmacy with potential drug interactions. The pt knows how to wean clonazepam and has asked for 30days of tramadol to wean from the percocet. Note he is only on a half tablet of percocet qid. As a result will provide only 25mg  tabs to start qday and increase every third day to bid and tid max for 30 days total. No refills will be provided. Pt is aware.      [name] History of asthma 01/24/2013     - since  childhood    - 11/23/16: eval w Dr. Renita Papa (Pulm at [facility]): bronchial asthma mild, allergic rhinitis postnasal drainage: uses albuterol BID for chest tightness. Check nitric oxide test. He has had intermittent eosinophilia and this points towards an eosinophilic asthma phenotype. He does have allergic rhinitis and think he would be better servied with our triple combination nose spray which includes mometasone, diphenhydramine and iptratroprium. I gave him rx for this.      [name] BPH (benign prostatic hyperplasia) 01/24/2013     As far as the AI, there was a question on whether he absorbs the [facility]. A 24 hour UFC was obtained and showed elevated free cortisol values indicative of adequate absorption. The patient raised the question of whether he could be metabolizing the [facility] "too fast" and hence explaining the increased need of HC dosing.   In addition to [facility], he is on growth Hormone replacement and the doses are adjusted by the [facility] clinic endocrinologist. He is on testosterone and levothyroxine a well. He is not of desmopressin.  He has HTN and hyperlipidemia and is on a statin and anti hypertensive drug therapy.      Clinically his main issue is chronic pain, mainly in the back of the neck and shoulder areas, as well as fatigue.  Fibromyalgia/chronic fatigue syndrome was entertained. His weight has been stable overall. He has required different pain medication regimens in order to allow him to cop with the pain.   He describes episodes of what he refers to as "adrenal crisis" whee he feels nauseated, dizzy and very tired. He feels much better after few days of high doses hydrocortisone. He also used parenteral Solucortef in the past to treat his symptoms.     Past Medical History:   Diagnosis Date   . Aortic regurgitation    . Ascending aortic aneurysm 09/19/2018    4 cm ECHO   . Asthma    . Avascular necrosis 2008    R hip - did not require surgery   . Bilateral cataracts     budding   . Brain tumor 2003    Taken  out in 2003, has returned    . Calculus of kidney    . Cold intolerance    . Color blindness    . Disorder of prostate     BPH   . DJD (  degenerative joint disease)     Lumbar / Sacral   . Dry eyes     uses restasis eyedrops which helps (RF, CCP, CRP and ESR have been wnl in past)   . Dysautonomia    . Eczema 03/29/2016    Right thumb    . Encounter for blood transfusion 2004   . Encounter for hepatitis C screening test for low risk patient 08/2016    negative   . Encounter for pharmacogenetic testing 10/2015    [facility]IM[facility]   . Fibromyalgia    . Gastroesophageal reflux disease    . Hemorrhoids    . Hyperlipidemia    . Hypertension    . Hypothyroidism    . Lateral epicondylitis     Left   . Malignant neoplasm of skin    . Mitral valve insufficiency    . Osteoporosis    . Peripheral neuropathy 12/26/2019   . Screening PSA (prostate specific antigen) 02/14/2018    0.4   . Sleep apnea     mild, no cpap   . Tinnitus     high pitched buzzing comes and goes - started after craniotomy for meningioma     Past Surgical History:   Procedure Laterality Date   . ABLATION OF DYSRHYTHMIC FOCUS  meningioma   . COLONOSCOPY  2008   . ECHOCARDIOGRAM, TRANSTHORACIC  04/29/2005    63   . ECHOCARDIOGRAM, TRANSTHORACIC  10/17/2016    63   . EGD, COLONOSCOPY N/A 04/16/2019    Procedure: EGD, COLONOSCOPY;  Surgeon: Leatha Gilding, MD;  Location: [facility];  Service: Gastroenterology;  Laterality: N/A;  egd, colonoscopy  q1-unk, md req 45 min   . EXCISION, LESION  12/03/2008    benign lesion   . ORCHIOPEXY Left 1980    for intermittent torsion   . Subtotal resection of posterior sagittal meningioma  12/05/2002    Partial brain resection secondary to meningioma at [facility]   . TONSILLECTOMY AND ADENOIDECTOMY  as a child   . TOOTH EXTRACTION Right 06/2018     Family History   Problem Relation Age of Onset   . Asthma Daughter    . Tuberculosis Mother    . Dementia Mother    . Migraines Sister      Allergies   Allergen Reactions   . Bextra  [Valdecoxib] Rash     Social History     Socioeconomic History   . Marital status: Married   Tobacco Use   . Smoking status: Never Smoker   . Smokeless tobacco: Never Used   Vaping Use   . Vaping Use: Never used   Substance and Sexual Activity   . Alcohol use: Not Currently     Alcohol/week: 1.0 standard drink     Types: 1 Drinks containing 0.5 oz of alcohol per week     Comment: rarely   . Drug use: Never     Types: Marijuana   . Sexual activity: Not Currently     Partners: Female     Birth control/protection: None   Social History Narrative    He lives in [place] and is from [place]. Married - wife Angelique Blonder. Two daughters (one lives in [place] and one in [place]). Retired Web designer - retired in 2016.     Current Outpatient Medications on File Prior to Visit   Medication Sig Dispense Refill   . albuterol sulfate HFA (ProAir HFA) 108 (90 Base) MCG/ACT inhaler INHALE ONE PUFF BY MOUTH EVERY FOUR  TO SIX HOURS AS NEEDED FOR WHEEZING 25.5 g 1   . AMITRIPTYLINE HCL PO Take 40 mg by mouth nightly        . clonazePAM (KlonoPIN) 0.5 MG tablet Take 1 tablet (0.5 mg total) by mouth daily as needed for Anxiety 30 tablet 0   . Motegrity 2 MG Tab Take 1 tablet by mouth daily     . Probiotic Product (ALIGN PO) Take 1 tablet by mouth daily     . SYMBICORT 80-4.5 MCG/ACT inhaler Inhale 2 puffs into the lungs two times daily (Patient taking differently: 2 puffs 2 (two) times daily Have patient rinse mouth after use) 10.2 g 3   . tamsulosin (FLOMAX) 0.4 MG Cap Take 1 capsule (0.4 mg total) by mouth Daily after dinner 90 capsule 3   . Testosterone 20.25 MG/ACT (1.62%) Gel Apply 4 actuations daily (2 on each shoulder) 300 g 2   . senna (SENOKOT) 8.6 MG tablet Take 1 tablet by mouth 2 (two) times daily        . zolpidem (AMBIEN CR) 6.25 MG CR tablet        No current facility-administered medications on file prior to visit.     Vitals:    07/06/20 1311   BP: 151/72   Pulse: 88   Temp: 98.2 F (36.8 C)   SpO2: 95%     Lab Results    Component Value Date    HGBA1C 5.8 09/18/2020    HGBA1C 6.2 (H) 11/27/2019    HGBA1C 6.3 (H) 09/06/2018     Lab Results   Component Value Date    WBC 13.99 (H) 12/10/2020    HGB 14.5 12/10/2020    HCT 42.4 12/10/2020    PLT 230 12/10/2020    CHOL 177 02/13/2018    TRIG 237 (H) 02/13/2018    HDL 54 02/13/2018    LDL 76 02/13/2018    ALT 94 (H) 12/10/2020    AST 54 (H) 12/10/2020    NA 138 12/10/2020    K 4.3 12/10/2020    CL 102 12/10/2020    CREAT 1.1 12/10/2020    BUN 27 12/10/2020    CO2 22 12/10/2020    TSH 0.16 (L) 11/19/2020    PSA 0.4 02/13/2018    INR 1.0 05/22/2018    GLU 151 (H) 12/10/2020    HGBA1C 5.8 09/18/2020     Lab Results   Component Value Date    TSH 0.16 (L) 11/19/2020    T4 7.4 09/06/2018     Review of Systems    Constitutional: fatigue and chronic pain  Eyes: negative  Respiratory: negative  Cardiovascular: negative  Gastrointestinal: negative  Musculoskeletal: negative   Neurological: negative  Behavioral: negative  Skin: negative  Hematology: negative      Examination  Vitals:    12/31/19 1452   BP: 163/81   Pulse: 97   Temp:    SpO2:      Constitutional: Oriented to person, place, and time and well-developed, well-nourished, and in no distress.   Head: Normocephalic and atraumatic.   Eyes: Conjunctivae and EOM are normal. Pupils are equal, round, and reactive to light.   Neck: Normal thyroid exam.  Cardiac: Normal auscultation.  Pulmonary: Normal auscultation.  Extremities: No cyanosis, clubbing or edema.  Musculoskeletal: Normal range of motion.   Neurological: Alert and oriented to person, place, and time.   Psychiatric: Affect and judgment normal.  He does not have cushingoid features.  Lab Review    Results for Jamison Neighbor, MD    Ref. Range 03/23/2018 14:33 11/27/2019 14:59   Testosterone Latest Ref Range: 264 - 916 ng/dL 161 (L) 096 (L)       Imaging report    CT Head WO Contrast [[id number]] (Order [id number])   Status: Final result   Study Result     History: Nausea.  History of meningioma.    TECHNIQUE: Noncontrast CT of the brain. The following dose reduction  techniques were utilized: automatic exposure control and/or adjustment  of mA and/or kV according to patient size, and the use of iterative  reconstruction technique.    COMPARISON: MRI of the brain dated 06/07/2016.    FINDINGS: Again seen are postoperative changes with evidence of parietal  and occipital craniotomy. There is stable encephalomalacia involving the  right parietal and occipital lobes. There is a 27 x 24 mm lobulated  partially calcified extra-axial mass centered upon the posterior aspect  of the falx cerebri, eccentric towards the left, which appears to invade  the superior sagittal sinus. This lesion corresponds to the enhancing  extra-axial mass seen on the prior MRI study and is consistent with  meningioma. The mass indents the medial left parietal and occipital  lobes without significant rectal edema.    There is no evidence of acute infarction or intracranial hemorrhage. The  ventricles are normal without hydrocephalus. The orbits appear  unremarkable. The visualized portions of the paranasal sinuses are  clear.    IMPRESSION:     1. No acute intracranial abnormality.  2. Partially calcified extra-axial mass centered upon the posterior  aspect of the falx cerebri which invades the superior sagittal sinus is  similar in configuration compared to the previous MRI study of  06/07/2016 and is consistent with meningioma.    Georgann Housekeeper, MD   03/11/2017 11:39 AM     MRI Brain W WO Contrast [[id number]] (Order [id number])   Status: Final result   Study Result     [facility]  MRI [place], [place] EXAM PERFORMED AT:  [id number] DOB:Nov 15, 1950 [id number]  06/07/2016 AGE:49 Gender:M Physicians Only:  [phone]        Coralyn Mark MD [F]  [address]  [place], [place] [address]        MRI BRAIN WITHOUT AND WITH CONTRAST    HISTORY: Followup meningioma    COMPARISON: A prior brain MR 09/10/15    TECHNIQUE: MR imaging of the brain was performed on a 1.5 Tesla magnet  prior to and following the uneventful intravenous administration of 8 cc of  Gadavist contrast material.    FINDINGS: Again noted are postsurgical changes centered in the right  occipital lobe. There is a resection cavity surrounded by hyperintense  T2/FLAIR signal which is stable in volume. No masslike or nodular  enhancement is seen at the level of resection.    Again identified is an extra-axial enhancing mass lesion involving the  midline occipital and posterior left parietal region. It measures  approximately 2.8 cm AP x 2.5 cm transverse x 4.3 cm tall (compared to 2.8  cm AP x 2.5 cm transverse x 4 cm tall when measured on the postcontrast  images). Again seen is invasion/occlusion of the adjacent superior sagittal  sinus. There is mild mass effect on the left parasagittal occipital lobe  however there is no cerebral edema.    There is stability of a subtle  dural thickening in the left parafalcine  region superiorly.    The brain parenchyma maintains an otherwise normal signal and enhancement  pattern. Ventricular size and position is normal. There are appropriate  flow voids of the major vessels.    There is stability of a largely CSF filled sella turcica.    There is progressive opacification of the left mastoid air cells. There is  no significant paranasal sinus disease.    IMPRESSION:     1. Again seen is a left posterior parafalcine meningioma. There is a  slightly greater measurement in the cephalocaudal plane which may be  related to technique or minimal tumor progression.  2. Progressive opacification of the left mastoid air cells.  3. Otherwise stable  exam.    The examination was reviewed with the patient.     Electronically signed by: Otho Ket M.D.  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 Patient is currently on [facility] for (AI), levothyroxine and testosterone   We will work with him on adjusting his thyroid replacement, his HC and testosterone. We entertained T4 and T3 combination  Discussed targets (both clinical and biochemical) for these different hormones  Follow up in few months or as needed.      Elijio Miles.H.Tamera Punt, MD

## 2021-01-05 ENCOUNTER — Encounter: Payer: Self-pay | Admitting: Internal Medicine

## 2021-01-06 ENCOUNTER — Other Ambulatory Visit: Payer: Self-pay | Admitting: Internal Medicine

## 2021-01-06 DIAGNOSIS — E039 Hypothyroidism, unspecified: Secondary | ICD-10-CM

## 2021-01-06 DIAGNOSIS — E291 Testicular hypofunction: Secondary | ICD-10-CM

## 2021-01-06 DIAGNOSIS — R7401 Elevation of levels of liver transaminase levels: Secondary | ICD-10-CM

## 2021-01-06 MED ORDER — LEVOTHYROXINE SODIUM 150 MCG PO TABS
150.0000 ug | ORAL_TABLET | Freq: Every day | ORAL | 0 refills | Status: DC
Start: 2021-01-06 — End: 2021-04-05

## 2021-01-06 MED ORDER — TESTOSTERONE 20.25 MG/ACT (1.62%) TD GEL
TRANSDERMAL | 1 refills | Status: DC
Start: 2021-01-06 — End: 2021-10-20

## 2021-01-07 ENCOUNTER — Encounter: Payer: Self-pay | Admitting: Internal Medicine

## 2021-01-07 DIAGNOSIS — E039 Hypothyroidism, unspecified: Secondary | ICD-10-CM

## 2021-01-08 ENCOUNTER — Encounter: Payer: Self-pay | Admitting: Internal Medicine

## 2021-01-12 ENCOUNTER — Other Ambulatory Visit: Payer: Self-pay

## 2021-01-13 ENCOUNTER — Other Ambulatory Visit: Payer: Self-pay | Admitting: Internal Medicine

## 2021-02-04 ENCOUNTER — Ambulatory Visit: Payer: Medicare Other

## 2021-02-04 DIAGNOSIS — R7401 Elevation of levels of liver transaminase levels: Secondary | ICD-10-CM

## 2021-02-04 DIAGNOSIS — E039 Hypothyroidism, unspecified: Secondary | ICD-10-CM

## 2021-02-04 NOTE — Progress Notes (Signed)
Blood Draw:  Fasting? =non fasting  Location of stick: left ac using aseptic technique.  Outcome: Patient tolerated well without any complications. Attempt # 1    Collected:   1 Tiger top tubes.    Lab/Temperature: refrigerated/[facility]

## 2021-02-05 ENCOUNTER — Encounter: Payer: Self-pay | Admitting: Internal Medicine

## 2021-02-05 DIAGNOSIS — B370 Candidal stomatitis: Secondary | ICD-10-CM

## 2021-02-05 LAB — COMPREHENSIVE METABOLIC PANEL
ALT: 75 IU/L — ABNORMAL HIGH (ref 0–44)
AST (SGOT): 49 IU/L — ABNORMAL HIGH (ref 0–40)
Albumin/Globulin Ratio: 2 (ref 1.2–2.2)
Albumin: 4.5 g/dL (ref 3.7–4.7)
Alkaline Phosphatase: 75 IU/L (ref 44–121)
BUN / Creatinine Ratio: 18 (ref 10–24)
BUN: 22 mg/dL (ref 8–27)
Bilirubin, Total: 0.6 mg/dL (ref 0.0–1.2)
CO2: 18 mmol/L — ABNORMAL LOW (ref 20–29)
Calcium: 9 mg/dL (ref 8.6–10.2)
Chloride: 102 mmol/L (ref 96–106)
Creatinine: 1.19 mg/dL (ref 0.76–1.27)
Globulin, Total: 2.2 g/dL (ref 1.5–4.5)
Protein, Total: 6.7 g/dL (ref 6.0–8.5)
Sodium: 140 mmol/L (ref 134–144)
eGFR: 65 mL/min/{1.73_m2} (ref >59)

## 2021-02-05 LAB — TSH: TSH: 0.428 u[IU]/mL — ABNORMAL LOW (ref 0.450–4.500)

## 2021-02-05 LAB — T4, FREE: T4, Free: 1.62 ng/dL (ref 0.82–1.77)

## 2021-02-05 MED ORDER — CLOTRIMAZOLE 10 MG MT TROC
10.0000 mg | Freq: Every day | OROMUCOSAL | 0 refills | Status: AC
Start: 2021-02-05 — End: 2021-02-19

## 2021-02-06 LAB — SPECIMEN STATUS REPORT

## 2021-02-06 LAB — T3, FREE: T3, Free: 2.6 pg/mL (ref 2.0–4.4)

## 2021-02-09 ENCOUNTER — Encounter (INDEPENDENT_AMBULATORY_CARE_PROVIDER_SITE_OTHER): Payer: Self-pay | Admitting: Internal Medicine

## 2021-02-09 ENCOUNTER — Ambulatory Visit (INDEPENDENT_AMBULATORY_CARE_PROVIDER_SITE_OTHER): Payer: Medicare Other | Admitting: Internal Medicine

## 2021-02-09 VITALS — BP 150/89 | HR 99 | Resp 18 | Ht 69.0 in | Wt 180.0 lb

## 2021-02-09 DIAGNOSIS — E291 Testicular hypofunction: Secondary | ICD-10-CM

## 2021-02-09 DIAGNOSIS — E230 Hypopituitarism: Secondary | ICD-10-CM

## 2021-02-09 DIAGNOSIS — R3912 Poor urinary stream: Secondary | ICD-10-CM

## 2021-02-09 DIAGNOSIS — E039 Hypothyroidism, unspecified: Secondary | ICD-10-CM

## 2021-02-09 DIAGNOSIS — E2749 Other adrenocortical insufficiency: Secondary | ICD-10-CM

## 2021-02-09 LAB — SPECIMEN STATUS REPORT

## 2021-02-09 NOTE — Progress Notes (Signed)
[facility]                       Date of Exam: 02/09/2021 2:20 PM        Patient ID: Shannon Neighbor, MD is a 70 y.o. male.  Attending Physician: Deloria Lair, MD        Chief Complaint:    Chief Complaint   Patient presents with    Pituitary Disorder     Brain tumor, chronic fatigue....etc                HPI:    HPI    69yo being seen for hypoPIT  Please see chart for full details  Previously seen by Dr. Margarette Asal   On [facility] 15/5mg   Has chronic fatigue, states it is the same since his surgery no matter what his dosage has been   Also with fibromyalgia     Central hypothyroidism   Currently on LT4  Free hormones wnl  Did not tolerate cytomel    Hypogonadism  On androgel     Per chart, previously on GH, but off for some time  No hx of DI                   Problem List:    Patient Active Problem List   Diagnosis    Chronic fatigue syndrome with fibromyalgia    Essential hypertension    Mixed hyperlipidemia    Primary hypothyroidism    Secondary male hypogonadism    History of asthma    BPH (benign prostatic hyperplasia)    History of meningioma of the brain    Chronic back pain    Growth hormone deficiency    Hemianopia, homonymous, left    History of insomnia    Amnestic MCI (mild cognitive impairment with memory loss)    Secondary adrenal insufficiency    Recurrent sinusitis    Spondylosis of cervical region without myelopathy or radiculopathy    Left testicular pain    Xerostomia    Basal cell carcinoma (BCC) of skin of nose    IFG (impaired fasting glucose)    Onychomycosis    Aortic valve insufficiency    Irritable bowel syndrome with constipation / Cyclic vomiting syndrome    Orthostatic hypotension    Chronic kidney disease, stage III (moderate)    Chronic narcotic dependence    Meningioma    Trapezius strain, right, initial encounter    Transaminitis    Asthma    Disequilibrium    Dysautonomia             Current Meds:    Outpatient Medications Marked as  Taking for the 02/09/21 encounter (Office Visit) with Deloria Lair, MD   Medication Sig Dispense Refill    albuterol sulfate HFA (ProAir HFA) 108 (90 Base) MCG/ACT inhaler INHALE ONE PUFF BY MOUTH EVERY FOUR TO SIX HOURS AS NEEDED FOR WHEEZING 25.5 g 1    AMITRIPTYLINE HCL PO Take 40 mg by mouth nightly         amLODIPine (NORVASC) 5 MG tablet Take 1 tablet (5 mg total) by mouth 2 (two) times daily 180 tablet 3    clonazePAM (KlonoPIN) 0.5 MG tablet Take 1 tablet (0.5 mg total) by mouth daily as needed for Anxiety 30 tablet 0    finasteride (PROSCAR) 5 MG tablet Take 1 tablet (5 mg total) by mouth nightly 90 tablet 3  hydrocortisone (CORTEF) 5 MG tablet Take 15 mg in am, 5 mg in early afternoon and 5 mg at 7 pm. Allow for extra doses in case of stress, illness, fever. 600 tablet 2    levothyroxine (SYNTHROID) 150 MCG tablet Take 1 tablet (150 mcg total) by mouth Once a day at 6:00am 90 tablet 0    metoprolol succinate XL (Toprol XL) 25 MG 24 hr tablet Take 1 tablet (25 mg total) by mouth nightly 90 tablet 3    montelukast (SINGULAIR) 10 MG tablet TAKE 1 TABLET BY MOUTH EVERY NIGHT AT BEDTIME AS DIRECTED 90 tablet 3    promethazine (PHENERGAN) 25 MG suppository       senna (SENOKOT) 8.6 MG tablet Take 1 tablet by mouth 2 (two) times daily         simvastatin (ZOCOR) 20 MG tablet Take 1 tablet (20 mg total) by mouth nightly 90 tablet 3    SYMBICORT 80-4.5 MCG/ACT inhaler Inhale 2 puffs into the lungs two times daily (Patient taking differently: 2 puffs 2 (two) times daily Have patient rinse mouth after use) 10.2 g 3    tamsulosin (FLOMAX) 0.4 MG Cap Take 1 capsule (0.4 mg total) by mouth Daily after dinner 90 capsule 3    Testosterone 20.25 MG/ACT (1.62%) Gel Apply 4 actuations daily (2 on each shoulder) 300 g 1    zolpidem (AMBIEN CR) 6.25 MG CR tablet             Allergies:    Allergies   Allergen Reactions    Bextra [Valdecoxib] Rash             Past Surgical History:    Past Surgical History:   Procedure  Laterality Date    ABLATION OF DYSRHYTHMIC FOCUS  meningioma    COLONOSCOPY  2008    ECHOCARDIOGRAM, TRANSTHORACIC  04/29/2005    63    ECHOCARDIOGRAM, TRANSTHORACIC  10/17/2016    63    EGD, COLONOSCOPY N/A 04/16/2019    Procedure: EGD, COLONOSCOPY;  Surgeon: Leatha Gilding, MD;  Location: [facility];  Service: Gastroenterology;  Laterality: N/A;  egd, colonoscopy  q1-unk, md req 45 min    EXCISION, LESION  12/03/2008    benign lesion    ORCHIOPEXY Left 1980    for intermittent torsion    Subtotal resection of posterior sagittal meningioma  12/05/2002    Partial brain resection secondary to meningioma at [facility]    TONSILLECTOMY AND ADENOIDECTOMY  as a child    TOOTH EXTRACTION Right 06/2018           Family History:    Family History   Problem Relation Age of Onset    Asthma Daughter     Tuberculosis Mother     Dementia Mother     Migraines Sister            Social History:    Social History     Tobacco Use    Smoking status: Never    Smokeless tobacco: Never   Vaping Use    Vaping Use: Never used   Substance Use Topics    Alcohol use: Not Currently     Alcohol/week: 1.0 standard drink     Types: 1 Drinks containing 0.5 oz of alcohol per week     Comment: rarely    Drug use: Never     Types: Marijuana           The following sections were reviewed this encounter by  the provider:              Vital Signs:    BP 150/89 (BP Site: Left arm, Patient Position: Sitting, Cuff Size: Medium)   Pulse 99   Resp 18   Ht 1.753 m (5\' 9" )   Wt 81.6 kg (180 lb)   BMI 26.58 kg/m          ROS:    As per HPI           Physical Exam:    Physical Exam  Constitutional: NAD, conversant  ENT: normal inspection of nose, normal inspection of ears  Neck: symmetrical, no obvious thyromegaly, gross movement intact  Head: atraumatic, normocephalic  Respiratory:  normal respiratory effort  Psychiatric: normal affect, normal mood  Neurologic: normal speech          Assessment:    1. Hypopituitarism  - CBC and differential; Future  -  Testosterone Free and Total; Future  - PSA; Future    2. Hypogonadism in male  - CBC and differential; Future  - Testosterone Free and Total; Future  - PSA; Future    3. Secondary adrenal insufficiency  - CBC and differential; Future  - Testosterone Free and Total; Future  - PSA; Future    4. Hypothyroidism, acquired  - CBC and differential; Future  - Testosterone Free and Total; Future  - PSA; Future    5. Weak urinary stream  - PSA; Future            Plan:    Hypopit - see below    2. Hypothyroidism - continue current regimen   Free hormones WNL    3. AI - continue HC   Discussed stress dosing during times of stress or illness    4. Testosterone deficiency - discussed risks and benefits of testosterone therapy.   Risks include, but are not limited to, prostate disease, heart disease, stroke, and fertility issues.  Patient understands these risks.     Wants to continue T    Will check labs                   Follow-up:    No follow-ups on file.         Deloria Lair, MD

## 2021-02-14 ENCOUNTER — Other Ambulatory Visit: Payer: Self-pay | Admitting: Internal Medicine

## 2021-02-14 DIAGNOSIS — N401 Enlarged prostate with lower urinary tract symptoms: Secondary | ICD-10-CM

## 2021-02-14 DIAGNOSIS — R350 Frequency of micturition: Secondary | ICD-10-CM

## 2021-02-15 MED ORDER — FINASTERIDE 5 MG PO TABS
5.0000 mg | ORAL_TABLET | Freq: Every evening | ORAL | 3 refills | Status: DC
Start: 2021-02-15 — End: 2022-01-14

## 2021-02-17 ENCOUNTER — Encounter (INDEPENDENT_AMBULATORY_CARE_PROVIDER_SITE_OTHER): Payer: Self-pay

## 2021-02-23 ENCOUNTER — Encounter (INDEPENDENT_AMBULATORY_CARE_PROVIDER_SITE_OTHER): Payer: Self-pay | Admitting: Cardiology

## 2021-03-01 ENCOUNTER — Telehealth: Payer: Self-pay | Admitting: Family Medicine

## 2021-03-01 ENCOUNTER — Other Ambulatory Visit: Payer: Medicare Other

## 2021-03-01 DIAGNOSIS — I10 Essential (primary) hypertension: Secondary | ICD-10-CM

## 2021-03-01 DIAGNOSIS — D72829 Elevated white blood cell count, unspecified: Secondary | ICD-10-CM

## 2021-03-01 NOTE — Telephone Encounter (Signed)
Epic chat message from endocrinology team:    Hello Dr. Alanda Slim, Dr. [name] is coming today for labs from his Endo. He called asking if Dr. Orlin Hilding wants to add something else? Can you check?      My response after reviewing the chart.    hello, I added a bmp and cbc; if fasting, please do lipids. thanks

## 2021-03-03 ENCOUNTER — Other Ambulatory Visit: Payer: Medicare Other

## 2021-03-03 ENCOUNTER — Ambulatory Visit: Payer: Medicare Other

## 2021-03-03 DIAGNOSIS — D72829 Elevated white blood cell count, unspecified: Secondary | ICD-10-CM

## 2021-03-03 DIAGNOSIS — R3912 Poor urinary stream: Secondary | ICD-10-CM

## 2021-03-03 DIAGNOSIS — E2749 Other adrenocortical insufficiency: Secondary | ICD-10-CM

## 2021-03-03 DIAGNOSIS — E230 Hypopituitarism: Secondary | ICD-10-CM

## 2021-03-03 DIAGNOSIS — E290 Testicular hyperfunction: Secondary | ICD-10-CM

## 2021-03-03 DIAGNOSIS — I10 Essential (primary) hypertension: Secondary | ICD-10-CM

## 2021-03-03 NOTE — Progress Notes (Signed)
Blood Draw:  Fasting? = fasting  Location of stick: right ac using aseptic technique.  Outcome: Patient tolerated well without any complications. Attempt # 1.    Collected:   2 Gold Top tubes.-serum separated  1 Lavender EDTA top tubes.    Lab/Temperature: [facility]/refrigerated

## 2021-03-04 LAB — LIPID PANEL
Cholesterol / HDL Ratio: 3.6 ratio (ref 0.0–5.0)
Cholesterol: 217 mg/dL — ABNORMAL HIGH (ref 100–199)
HDL: 61 mg/dL (ref >39)
LDL Chol Calculated (NIH): 105 mg/dL — ABNORMAL HIGH (ref 0–99)
Triglycerides: 299 mg/dL — ABNORMAL HIGH (ref 0–149)
VLDL Calculated: 51 mg/dL — ABNORMAL HIGH (ref 5–40)

## 2021-03-04 LAB — CBC AND DIFFERENTIAL
Baso(Absolute): 0.1 10*3/uL (ref 0.0–0.2)
Basos: 1 %
Eos: 3 %
Eosinophils Absolute: 0.4 10*3/uL (ref 0.0–0.4)
Hematocrit: 45 % (ref 37.5–51.0)
Hemoglobin: 14.8 g/dL (ref 13.0–17.7)
Immature Granulocytes Absolute: 0 10*3/uL (ref 0.0–0.1)
Immature Granulocytes: 0 %
Lymphocytes Absolute: 3.3 10*3/uL — ABNORMAL HIGH (ref 0.7–3.1)
Lymphocytes: 31 %
MCH: 30.2 pg (ref 26.6–33.0)
MCHC: 32.9 g/dL (ref 31.5–35.7)
MCV: 92 fL (ref 79–97)
Monocytes Absolute: 1 10*3/uL — ABNORMAL HIGH (ref 0.1–0.9)
Monocytes: 9 %
Neutrophils Absolute: 5.9 10*3/uL (ref 1.4–7.0)
Neutrophils: 56 %
Platelets: 223 10*3/uL (ref 150–450)
RBC: 4.9 x10E6/uL (ref 4.14–5.80)
RDW: 12.5 % (ref 11.6–15.4)
WBC: 10.7 10*3/uL (ref 3.4–10.8)

## 2021-03-04 LAB — BASIC METABOLIC PANEL
BUN / Creatinine Ratio: 22 (ref 10–24)
BUN: 30 mg/dL — ABNORMAL HIGH (ref 8–27)
CO2: 19 mmol/L — ABNORMAL LOW (ref 20–29)
Calcium: 9.3 mg/dL (ref 8.6–10.2)
Chloride: 103 mmol/L (ref 96–106)
Creatinine: 1.37 mg/dL — ABNORMAL HIGH (ref 0.76–1.27)
Glucose: 108 mg/dL — ABNORMAL HIGH (ref 65–99)
Potassium: 4.3 mmol/L (ref 3.5–5.2)
Sodium: 141 mmol/L (ref 134–144)
eGFR: 55 mL/min/{1.73_m2} — ABNORMAL LOW (ref >59)

## 2021-03-05 ENCOUNTER — Encounter: Payer: Self-pay | Admitting: Internal Medicine

## 2021-03-05 ENCOUNTER — Other Ambulatory Visit: Payer: Self-pay | Admitting: Internal Medicine

## 2021-03-05 DIAGNOSIS — R7301 Impaired fasting glucose: Secondary | ICD-10-CM

## 2021-03-05 DIAGNOSIS — E039 Hypothyroidism, unspecified: Secondary | ICD-10-CM

## 2021-03-05 DIAGNOSIS — E2749 Other adrenocortical insufficiency: Secondary | ICD-10-CM

## 2021-03-06 LAB — PSA: Prostate Specific Antigen, Total: 0.141 ng/mL

## 2021-03-08 LAB — SPECIMEN STATUS REPORT

## 2021-03-09 ENCOUNTER — Ambulatory Visit (INDEPENDENT_AMBULATORY_CARE_PROVIDER_SITE_OTHER): Payer: Medicare Other | Admitting: Nurse Practitioner

## 2021-03-09 ENCOUNTER — Encounter: Payer: Self-pay | Admitting: Internal Medicine

## 2021-03-09 ENCOUNTER — Encounter (INDEPENDENT_AMBULATORY_CARE_PROVIDER_SITE_OTHER): Payer: Self-pay | Admitting: Nurse Practitioner

## 2021-03-09 VITALS — BP 156/94 | HR 82 | Ht 69.0 in | Wt 183.0 lb

## 2021-03-09 DIAGNOSIS — E782 Mixed hyperlipidemia: Secondary | ICD-10-CM

## 2021-03-09 DIAGNOSIS — I10 Essential (primary) hypertension: Secondary | ICD-10-CM

## 2021-03-09 LAB — HEMOGLOBIN A1C: Hemoglobin A1C: 6.2 % — ABNORMAL HIGH (ref 4.8–5.6)

## 2021-03-09 LAB — T3, FREE

## 2021-03-09 LAB — T4, FREE

## 2021-03-09 MED ORDER — METOPROLOL SUCCINATE ER 50 MG PO TB24
25.0000 mg | ORAL_TABLET | Freq: Every day | ORAL | 3 refills | Status: DC
Start: 2021-03-09 — End: 2021-03-09

## 2021-03-09 MED ORDER — RAMIPRIL 10 MG PO CAPS
20.0000 mg | ORAL_CAPSULE | Freq: Every evening | ORAL | 3 refills | Status: AC
Start: 2021-03-09 — End: ?

## 2021-03-09 MED ORDER — METOPROLOL SUCCINATE ER 50 MG PO TB24
50.0000 mg | ORAL_TABLET | Freq: Every day | ORAL | 3 refills | Status: DC
Start: 2021-03-09 — End: 2021-08-03

## 2021-03-09 NOTE — Progress Notes (Signed)
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  Medical Record #: [id number]  Requesting Physician: Modesto Charon, MD      CHIEF COMPLAINT: Follow-up      HISTORY OF PRESENT ILLNESS:    Shannon [name] is a pleasant 70 y.o. male who presents today for follow-up on hypertension.  Shannon [name] stopped it well above goal fairly consistently.  Shannon [name] is having difficulty losing weight although Shannon [name] is trying.  Shannon [name] previously had been on Toprol 50 mg but it was decreased when Shannon [name] was having issues with orthostatic hypotension.  This seems to have resolved.  Shannon [name] does have chronic pain which Shannon [name] takes Advil 400 mg 4-5 times a week for back pain.  His primary care physician did labs recently.      PAST MEDICAL HISTORY: Shannon [name] has a past medical history of Aortic regurgitation, Ascending aortic aneurysm (09/19/2018), Asthma, Avascular necrosis (2008), Bilateral cataracts, Brain tumor (2003), Calculus of kidney, Cold intolerance, Color blindness, Disorder of prostate, DJD (degenerative joint disease), Dry eyes, Dysautonomia, Eczema (03/29/2016), Encounter for blood transfusion (2004), Encounter for hepatitis C screening test for low risk patient (08/2016), Encounter for pharmacogenetic testing (10/2015), Fibromyalgia, Gastroesophageal reflux disease, Hemorrhoids, Hyperlipidemia, Hypertension, Hypothyroidism, Lateral epicondylitis, Malignant neoplasm of skin, Mitral valve insufficiency, Osteoporosis, Peripheral neuropathy (12/26/2019), Screening PSA (prostate specific antigen) (02/14/2018), Sleep apnea, and Tinnitus. Shannon [name] has a past surgical history that includes EXCISION, LESION (12/03/2008); Colonoscopy (2008); Tonsillectomy and adenoidectomy (as a child); Orchiopexy (Left, 1980);  Subtotal resection of posterior sagittal meningioma (12/05/2002); Tooth extraction (Right, 06/2018); EGD, COLONOSCOPY (N/A, 04/16/2019); ECHOCARDIOGRAM, TRANSTHORACIC (04/29/2005); ECHOCARDIOGRAM, TRANSTHORACIC (10/17/2016); and Ablation of dysrhythmic focus (meningioma).    ALLERGIES:   Allergies   Allergen Reactions    Bextra [Valdecoxib] Rash       MEDICATIONS:   Current Outpatient Medications:     albuterol sulfate HFA (ProAir HFA) 108 (90 Base) MCG/ACT inhaler, INHALE ONE PUFF BY MOUTH EVERY FOUR TO SIX HOURS AS NEEDED FOR WHEEZING, Disp: 25.5 g, Rfl: 1    AMITRIPTYLINE HCL PO, Take 40 mg by mouth nightly  , Disp: , Rfl:     amLODIPine (NORVASC) 5 MG tablet, Take 1 tablet (5 mg total) by mouth 2 (two) times daily, Disp: 180 tablet, Rfl: 3    clonazePAM (KlonoPIN) 0.5 MG tablet, Take 1 tablet (0.5 mg total) by mouth daily as needed for Anxiety, Disp: 30 tablet, Rfl: 0    ezetimibe (ZETIA) 10 MG tablet, Take 1 tablet (10 mg total) by mouth daily, Disp: 90 tablet, Rfl: 3    finasteride (PROSCAR) 5 MG tablet, Take 1 tablet (5 mg total) by mouth nightly, Disp: 90 tablet, Rfl: 3    hydrocortisone (CORTEF) 5 MG tablet, Take 15 mg in am, 5 mg in early afternoon and 5 mg at 7 pm. Allow for extra doses in case of stress, illness, fever., Disp: 600 tablet, Rfl: 2    levothyroxine (SYNTHROID) 150 MCG tablet, Take 1 tablet (150 mcg total) by mouth Once a day at 6:00am, Disp: 90 tablet, Rfl: 0    metoprolol succinate XL (Toprol XL) 25 MG 24 hr tablet, Take 1 tablet (25 mg total) by mouth  nightly, Disp: 90 tablet, Rfl: 3    montelukast (SINGULAIR) 10 MG tablet, TAKE 1 TABLET BY MOUTH EVERY NIGHT AT BEDTIME AS DIRECTED, Disp: 90 tablet, Rfl: 3    oxyCODONE-acetaminophen (PERCOCET) 5-325 MG per tablet, Take 1 to 2 tablets by mouth daily as needed for severe pain, Disp: , Rfl:     Probiotic Product (ALIGN PO), Take 1 tablet by mouth daily, Disp: , Rfl:     promethazine (PHENERGAN) 25 MG suppository, Place 25 mg rectally 2 (two)  times daily, Disp: , Rfl:     ramipril (ALTACE) 10 MG capsule, Take 20 mg by mouth nightly, Disp: , Rfl:     senna (SENOKOT) 8.6 MG tablet, Take 1 tablet by mouth 2 (two) times daily  , Disp: , Rfl:     simvastatin (ZOCOR) 20 MG tablet, Take 1 tablet (20 mg total) by mouth nightly, Disp: 90 tablet, Rfl: 3    SYMBICORT 80-4.5 MCG/ACT inhaler, Inhale 2 puffs into the lungs two times daily (Patient taking differently: 2 puffs 2 (two) times daily Have patient rinse mouth after use), Disp: 10.2 g, Rfl: 3    tamsulosin (FLOMAX) 0.4 MG Cap, Take 1 capsule (0.4 mg total) by mouth Daily after dinner, Disp: 90 capsule, Rfl: 3    Testosterone 20.25 MG/ACT (1.62%) Gel, Apply 4 actuations daily (2 on each shoulder), Disp: 300 g, Rfl: 1    zolpidem (AMBIEN CR) 6.25 MG CR tablet, , Disp: , Rfl:      FAMILY HISTORY: family history includes Asthma in his daughter; Dementia in his mother; Migraines in his sister; Tuberculosis in his mother.    SOCIAL HISTORY: Shannon [name] reports that Shannon [name] has never smoked. Shannon [name] has never used smokeless tobacco. Shannon [name] reports that Shannon [name] does not currently use alcohol after a past usage of about 1.0 standard drink per week. Shannon [name] reports that Shannon [name] does not use drugs.    PHYSICAL EXAMINATION    Visit Vitals  BP (!) 156/94 (BP Site: Left arm, Patient Position: Sitting, Cuff Size: Medium)   Pulse 82   Ht 1.753 m (5\' 9" )   Wt 83 kg (183 lb)   BMI 27.02 kg/m       General Appearance:  A well-appearing, in no acute distress.    Skin: Warm and dry to touch  Head: Normocephalic, normal hair pattern  Eyes:  conjunctivae and lids unremarkable.  ENT: mask   Neck: JVP normal, no carotid bruit, thyroid not enlarged   Chest: Clear to auscultation bilaterally with good air movement and respiratory effort and no wheezes, rales, or rhonchi   Cardiovascular: Regular rhythm, S1 normal, S2 normal, No S3 or S4, Apical impulse not displaced. No murmurs. No gallops or rubs detected   Abdomen: Soft, nontender, nondistended, with normoactive bowel  sounds  Extremities: Warm without edema. No clubbing, or cyanosis. All peripheral pulses are full and equal.   Neuro: Alert and oriented x3. No gross motor or sensory deficits noted, affect appropriate.         ECG: N/A      LABS:   Lab Results   Component Value Date    WBC 10.7 03/03/2021    HGB 14.8 03/03/2021    HCT 45.0 03/03/2021    PLT 223 03/03/2021     Lab Results   Component Value Date    GLU 108 (H) 03/03/2021    BUN 30 (H) 03/03/2021    CREAT 1.37 (H) 03/03/2021    NA 141 03/03/2021    K  4.3 03/03/2021    CL 103 03/03/2021    CO2 19 (L) 03/03/2021    AST 49 (H) 02/04/2021    ALT 75 (H) 02/04/2021     Lab Results   Component Value Date    MG 1.9 02/12/2019    TSH 0.428 (L) 02/04/2021    HGBA1C 6.2 (H) 03/03/2021    BNP 12 11/19/2020     Lab Results   Component Value Date    CHOL 217 (H) 03/03/2021    TRIG 299 (H) 03/03/2021    HDL 61 03/03/2021    LDL 105 (H) 03/03/2021            Most recent echo and nuclear study reviewed.      IMPRESSION:   Shannon [name] is a 70 y.o. male with the following problems:    1.  Systemic hypertension with orthostasis, complex and related probably to his underlying fibromyalgia and probable dysautonomia.  2.  Hypertensive heart disease on echo 09/10/2018 Shannon [name] has LVH and mild aortic root dilatation.  Echo was similar but did not show LVH.  Aortic root dilatation had increased from 4.0 to 4.3 cm but 4.2 cm on recent CT scan.  3.  Dyslipidemia-simvastatin use.  4.  Partial resection of meningioma in 2004.  5.  Panhypopituitarism-on replacement therapy.  6.  Peripheral neuropathy and some cognitive issues.  7.  Fibromyalgia and chronic fatigue syndrome.  8.  Recent vague dyspnea perhaps due to thyroid medication changes-resolved.  No ischemic changes on EKG          RECOMMENDATIONS:    Change metoprolol to 50 mg once a day patient will continue to monitor his blood pressure closely and let us know if this is not working  Return office visit next visit available with Dr.  Tami Ribas                                                     No orders of the defined types were placed in this encounter.      No orders of the defined types were placed in this encounter.      SIGNED:    Ernestina Penna, NP          This note was generated by the Dragon speech recognition and may contain errors or omissions not intended by the user. Grammatical errors, random word insertions, deletions, pronoun errors, and incomplete sentences are occasional consequences of this technology due to software limitations. Not all errors are caught or corrected. If there are questions or concerns about the content of this note or information contained within the body of this dictation, they should be addressed directly with the author for clarification.

## 2021-03-10 ENCOUNTER — Encounter (INDEPENDENT_AMBULATORY_CARE_PROVIDER_SITE_OTHER): Payer: Self-pay | Admitting: Internal Medicine

## 2021-03-10 ENCOUNTER — Encounter: Payer: Self-pay | Admitting: Internal Medicine

## 2021-03-10 LAB — TESTOSTERONE, FREE, TOTAL
Testosterone Total MS: 741.4 ng/dL (ref 264.0–916.0)
Testosterone, Free: 13.2 pg/mL (ref 6.6–18.1)

## 2021-03-11 ENCOUNTER — Other Ambulatory Visit: Payer: Self-pay | Admitting: Internal Medicine

## 2021-03-11 DIAGNOSIS — E039 Hypothyroidism, unspecified: Secondary | ICD-10-CM

## 2021-03-15 ENCOUNTER — Encounter: Payer: Self-pay | Admitting: Internal Medicine

## 2021-03-16 ENCOUNTER — Other Ambulatory Visit: Payer: Self-pay | Admitting: Internal Medicine

## 2021-04-05 ENCOUNTER — Other Ambulatory Visit: Payer: Self-pay | Admitting: Internal Medicine

## 2021-04-05 DIAGNOSIS — E039 Hypothyroidism, unspecified: Secondary | ICD-10-CM

## 2021-04-05 MED ORDER — LEVOTHYROXINE SODIUM 150 MCG PO TABS
150.0000 ug | ORAL_TABLET | Freq: Every day | ORAL | 0 refills | Status: DC
Start: 2021-04-05 — End: 2021-05-21

## 2021-04-09 ENCOUNTER — Other Ambulatory Visit: Payer: Self-pay

## 2021-04-09 DIAGNOSIS — R3912 Poor urinary stream: Secondary | ICD-10-CM

## 2021-04-09 MED ORDER — TAMSULOSIN HCL 0.4 MG PO CAPS
0.4000 mg | ORAL_CAPSULE | Freq: Every day | ORAL | 0 refills | Status: DC
Start: 2021-04-09 — End: 2021-07-05

## 2021-04-13 DIAGNOSIS — M6530 Trigger finger, unspecified finger: Secondary | ICD-10-CM

## 2021-04-13 HISTORY — DX: Trigger finger, unspecified finger: M65.30

## 2021-04-19 ENCOUNTER — Encounter: Payer: Self-pay | Admitting: Internal Medicine

## 2021-04-19 DIAGNOSIS — M65331 Trigger finger, right middle finger: Secondary | ICD-10-CM | POA: Insufficient documentation

## 2021-04-29 ENCOUNTER — Encounter: Payer: Self-pay | Admitting: Internal Medicine

## 2021-05-07 ENCOUNTER — Telehealth: Payer: Self-pay | Admitting: Internal Medicine

## 2021-05-07 ENCOUNTER — Other Ambulatory Visit: Payer: Self-pay | Admitting: Internal Medicine

## 2021-05-07 DIAGNOSIS — I10 Essential (primary) hypertension: Secondary | ICD-10-CM

## 2021-05-07 NOTE — Telephone Encounter (Signed)
Pt is scheduling his labs at [facility].  He would like to have a BMP ordered as well.

## 2021-05-16 ENCOUNTER — Other Ambulatory Visit: Payer: Self-pay | Admitting: Neurology

## 2021-05-16 DIAGNOSIS — R519 Headache, unspecified: Secondary | ICD-10-CM

## 2021-05-17 ENCOUNTER — Other Ambulatory Visit: Payer: Self-pay | Admitting: Neurology

## 2021-05-17 ENCOUNTER — Ambulatory Visit
Admission: RE | Admit: 2021-05-17 | Discharge: 2021-05-17 | Disposition: A | Discharge status: Home / Self Care | Payer: Medicare Other | Source: Ambulatory Visit | Attending: Neurology | Admitting: Neurology

## 2021-05-17 DIAGNOSIS — D329 Benign neoplasm of meninges, unspecified: Secondary | ICD-10-CM

## 2021-05-17 DIAGNOSIS — R519 Headache, unspecified: Secondary | ICD-10-CM

## 2021-05-17 DIAGNOSIS — D320 Benign neoplasm of cerebral meninges: Secondary | ICD-10-CM | POA: Insufficient documentation

## 2021-05-17 MED ORDER — GADOBUTROL 1 MMOL/ML IV SOSY (WRAP)
8.0000 mL | Freq: Once | INTRAVENOUS | Status: AC | PRN
Start: 2021-05-17 — End: 2021-05-17
  Administered 2021-05-17: 20:00:00 8 mL via INTRAVENOUS
  Filled 2021-05-17: qty 10

## 2021-05-21 ENCOUNTER — Other Ambulatory Visit (INDEPENDENT_AMBULATORY_CARE_PROVIDER_SITE_OTHER): Payer: Self-pay | Admitting: Internal Medicine

## 2021-05-21 ENCOUNTER — Encounter: Payer: Self-pay | Admitting: Internal Medicine

## 2021-05-21 ENCOUNTER — Encounter (INDEPENDENT_AMBULATORY_CARE_PROVIDER_SITE_OTHER): Payer: Self-pay | Admitting: Internal Medicine

## 2021-05-21 ENCOUNTER — Ambulatory Visit (FREE_STANDING_LABORATORY_FACILITY): Payer: Medicare Other

## 2021-05-21 DIAGNOSIS — E039 Hypothyroidism, unspecified: Secondary | ICD-10-CM

## 2021-05-21 DIAGNOSIS — I10 Essential (primary) hypertension: Secondary | ICD-10-CM

## 2021-05-21 LAB — BASIC METABOLIC PANEL
Anion Gap: 10 (ref 5.0–15.0)
BUN: 21 mg/dL (ref 9.0–28.0)
CO2: 25 mEq/L (ref 17–29)
Calcium: 8.9 mg/dL (ref 7.9–10.2)
Chloride: 106 mEq/L (ref 99–111)
Creatinine: 1.3 mg/dL (ref 0.5–1.5)
Glucose: 108 mg/dL — ABNORMAL HIGH (ref 70–100)
Potassium: 4.5 mEq/L (ref 3.5–5.3)
Sodium: 141 mEq/L (ref 135–145)

## 2021-05-21 LAB — T3, FREE: T3, Free: 2.25 pg/mL (ref 1.71–3.71)

## 2021-05-21 LAB — HEMOLYSIS INDEX: Hemolysis Index: 9 Index (ref 0–24)

## 2021-05-21 LAB — TSH: TSH: 0.26 u[IU]/mL — ABNORMAL LOW (ref 0.35–4.94)

## 2021-05-21 LAB — GFR: EGFR: 54.3

## 2021-05-21 LAB — T4, FREE: T4 Free: 1.11 ng/dL (ref 0.69–1.48)

## 2021-05-21 NOTE — Progress Notes (Signed)
Blood Draw:  Fasting? = Yes  Location of stick: Right anti cubical using aseptic technique.  Outcome: Patient tolerated well without any complications. Attempt # 1.    Collected:   1 Gold top tubes.    Lab/Temperature: Refrigerated  Sent to [facility]        Fitness Evaluation:   Body Fat as percentage: In men, over 25% is obese, 20-25% is higher than normal, 16-20% is healthy / normal, <16% or under is considered lean / ideal.  2022= 32.2%        Visceral Fat: Abdominal "belly" fat.Visceral fat is a type of body fat that exists in the abdomen and surrounds the internal organs. Everyone has some, especially those who are sedentary, chronically stressed, or maintain unhealthy diets. A different type of fat -- subcutaneous fat -- which builds up under the skin, has less of a negative impact on health and is easier to lose than visceral fat. A high level of visceral fat can increase your risk for serious health problems including cardiovascular disease, types 2 diabetes, and increased blood pressure.   2022 = 13

## 2021-05-24 MED ORDER — LEVOTHYROXINE SODIUM 150 MCG PO TABS
150.0000 ug | ORAL_TABLET | Freq: Every day | ORAL | 0 refills | Status: DC
Start: 2021-05-24 — End: 2021-09-10

## 2021-06-07 ENCOUNTER — Encounter: Payer: Medicare Other | Admitting: Internal Medicine

## 2021-06-18 NOTE — Progress Notes (Signed)
Medical Group Neurosurgery  New Patient Note    Date: 06/21/2021  Patient Name: Shannon Neighbor, Shannon [name]    Patient Care Team:  Modesto Charon, Shannon [name] as PCP - General (Internal Medicine)  Irineo Axon, Shannon [name] as Consulting Physician (Neurological Surgery)  Espinel, Clemencia Course., Shannon [name] as Consulting Physician (Surgery)  Royetta Crochet, Shannon [name] as Consulting Physician (Pulmonary Disease)  Fredric Dine, Shannon [name] as Consulting Physician (Ophthalmology)  Lauralee Evener, DO as Consulting Physician (Pain Medicine)  Ernie Hew, Shannon [name] as Consulting Physician (Dermatology)  Vania Rea, Shannon [name] as Consulting Physician (Neurology)  Marisa Sprinkles, Shannon [name] as Consulting Physician (Pulmonary Disease)  Leatha Gilding, Shannon [name] as Consulting Physician (Gastroenterology)  Ricke Hey, Shannon [name] as Consulting Physician (Neuromuscular Medicine)  Vira Blanco, Shannon [name] as Consulting Physician (Cardiology)  Mariana Kaufman, Shannon [name] as Consulting Physician (Ophthalmology)  Einar Gip, Shannon [name] as Consulting Physician (Neurology)  Modesto Charon, Shannon [name]  Daine Gravel, RN as Registered Nurse  Lyn Hollingshead, NP as Nurse Practitioner (Nurse Practitioner)  Ernestina Penna, NP as Nurse Practitioner (Nurse Practitioner)  Harvel Quale, Shannon [name] as Consulting Physician (Cardiology)  Maida Sale as Registered Nurse  Provider, Generic Hpf (Inactive)  Cowden, Elease Hashimoto, RN as Registered Nurse  Jonathon Bellows, Shannon [name] as Consulting Physician (Infectious Disease)  Dreama Saa, PA as Physician Assistant (Physician Assistant)  Stevie Kern, NP as Nurse Practitioner (Nurse Practitioner)  Olga Coaster, Shannon [name] as Consulting Physician (Hand Surgery)  Olga Coaster, Shannon [name] as Consulting Physician (Hand Surgery)  May, Joanna I, [place] as Physician Assistant (Physician Assistant)  Deloria Lair, Shannon [name] as Consulting Physician (Endocrinology, Diabetes and Metabolism)    Diagnosis / Chief Complaint:     Meningioma     History of Present Illness:     Shannon Neighbor,  Shannon [name] is a 70 y.o.  male who has a medical hx of Aortic regurgitation, Ascending aortic aneurysm, Asthma, Avascular necrosis, Brain tumor (2003), Color blindness, Fibromyalgia,Hyperlipidemia, Hypertension, Hypothyroidism, Lateral epicondylitis, Malignant neoplasm of skin, Mitral valve insufficiency, and Sleep Apnea.     He underwent a parietal-occipital parasagittal craniotomy for resection of a meningioma in April 2004 at [facility] with Jenita Seashore, M.D.  Prior to surgery he had color blindness and an inferior left homonymous defect that improved following surgery. His post-operative MRI showed expected residual in the wall of the sagittal sinus.    In 2017 he was advised to have Cyberknife Radiation by his [name] his Neurosurgeon due to symptoms of high pitched tinnitis.   He was evaluated by [facility] [place], Benita Stabile, Shannon [name] who recommended Observation over radiation due to risk of causing worsening edema leading to worsening symptoms.     Today, the patient continues to be followed by neurology who obtains yearly MRIs.  Patient notes that he currently has increased blurry vision and is scheduled to see ophthalmology next week.  He denies any changes in his quadrantanopia.  He has not had any seizures.  He denies any new weakness, numbness, or tingling.  He denies having had any headaches.  He states that he is otherwise the same neurologically.    History was obtained from chart review and the patient.    Review of Systems:     Constitutional: negative for fever or chills.  HENT: Negative for tinnitus or rhinorrhea   Eyes: Negative for visual disturbance.   Musculoskeletal: Negative for gait problem. Negative for neck pain. Negative for back pain.  Skin: Negative for wounds.  Neurological: no history of seizures.  Respiratory: Negative for cough or wheezing  Endocrine: Negative for cold or heat intolerance   GI: Negative for constipation or diarrhea     Past Medical History:     Past  Medical History:   Diagnosis Date    Aortic regurgitation     Ascending aortic aneurysm 09/19/2018    4 cm ECHO    Asthma     Avascular necrosis 2008    R hip - did not require surgery    Bilateral cataracts     budding    Brain tumor 2003    Taken out in 2003, has returned     Calculus of kidney     Cold intolerance     Color blindness     Disorder of prostate     BPH    DJD (degenerative joint disease)     Lumbar / Sacral    Dry eyes     uses restasis eyedrops which helps (RF, CCP, CRP and ESR have been wnl in past)    Dysautonomia     Eczema 03/29/2016    Right thumb     Encounter for blood transfusion 2004    Encounter for hepatitis C screening test for low risk patient 08/2016    negative    Encounter for pharmacogenetic testing 10/2015    MEDIMAP    Fibromyalgia     Gastroesophageal reflux disease     Hemorrhoids     Hyperlipidemia     Hypertension     Hypothyroidism     IBS (irritable bowel syndrome)     Lateral epicondylitis     Left    Malignant neoplasm of skin     Mitral valve insufficiency     Osteoporosis     Peripheral neuropathy 12/26/2019    Screening PSA (prostate specific antigen) 02/14/2018    0.4    Sleep apnea     mild, no cpap    Tinnitus     high pitched buzzing comes and goes - started after craniotomy for meningioma    Trigger finger of right hand, unspecified finger 04/13/2021    3rd digit       Past Surgical History:     Past Surgical History:   Procedure Laterality Date    ABLATION OF DYSRHYTHMIC FOCUS  meningioma    COLONOSCOPY  2008    ECHOCARDIOGRAM, TRANSTHORACIC  04/29/2005    63    ECHOCARDIOGRAM, TRANSTHORACIC  10/17/2016    63    EGD, COLONOSCOPY N/A 04/16/2019    Procedure: EGD, COLONOSCOPY;  Surgeon: Leatha Gilding, Shannon [name];  Location: [facility];  Service: Gastroenterology;  Laterality: N/A;  egd, colonoscopy  q1-unk, Shannon [name] req 45 min    EXCISION, LESION  12/03/2008    benign lesion    ORCHIOPEXY Left 1980    for intermittent torsion    Subtotal resection of posterior sagittal  meningioma  12/05/2002    Partial brain resection secondary to meningioma at [facility]    TONSILLECTOMY AND ADENOIDECTOMY  as a child    TOOTH EXTRACTION Right 06/2018       Family History:     Family History   Problem Relation Age of Onset    Asthma Daughter     Tuberculosis Mother     Dementia Mother     Migraines Sister        Social History:     Social History  Socioeconomic History    Marital status: Married     Spouse name: None    Number of children: None    Years of education: None    Highest education level: None   Occupational History    None   Tobacco Use    Smoking status: Never    Smokeless tobacco: Never   Vaping Use    Vaping Use: Never used   Substance and Sexual Activity    Alcohol use: Not Currently     Alcohol/week: 1.0 standard drink     Types: 1 Drinks containing 0.5 oz of alcohol per week     Comment: rarely    Drug use: Never     Types: Marijuana    Sexual activity: Not Currently     Partners: Female     Birth control/protection: None   Other Topics Concern    None   Social History Narrative    He lives in [place] and is from [place]. Married - wife Angelique Blonder. Two daughters (one lives in [place] and one in [place]). Retired Web designer - retired in 2016.     Social Determinants of Health     Financial Resource Strain: Not on file   Food Insecurity: Not on file   Transportation Needs: Not on file   Physical Activity: Not on file   Stress: Not on file   Social Connections: Not on file   Intimate Partner Violence: Not on file   Housing Stability: Not on file       Allergies:     Allergies   Allergen Reactions    Bextra [Valdecoxib] Rash       Medications:     Current Outpatient Medications on File Prior to Visit   Medication Sig Dispense Refill    albuterol sulfate HFA (ProAir HFA) 108 (90 Base) MCG/ACT inhaler INHALE ONE PUFF BY MOUTH EVERY FOUR TO SIX HOURS AS NEEDED FOR WHEEZING 25.5 g 1    AMITRIPTYLINE HCL PO Take 40 mg by mouth nightly         amLODIPine (NORVASC) 5 MG tablet Take 1 tablet (5  mg total) by mouth 2 (two) times daily 180 tablet 3    clonazePAM (KlonoPIN) 0.5 MG tablet Take 1 tablet (0.5 mg total) by mouth daily as needed for Anxiety 30 tablet 0    diphenhydrAMINE-lidocaine viscous-nystatin-alum & mag hydroxide-simethicone (MAGIC MOUTHWASH) suspension Take by mouth      ezetimibe (ZETIA) 10 MG tablet Take 1 tablet (10 mg total) by mouth daily 90 tablet 3    finasteride (PROSCAR) 5 MG tablet Take 1 tablet (5 mg total) by mouth nightly 90 tablet 3    hydrocortisone (CORTEF) 5 MG tablet Take 15 mg in am, 5 mg in early afternoon and 5 mg at 7 pm. Allow for extra doses in case of stress, illness, fever. 600 tablet 2    ibuprofen (ADVIL) 200 MG tablet Take 600 mg by mouth every 6 (six) hours as needed for Pain      levothyroxine (SYNTHROID) 150 MCG tablet Take 1 tablet (150 mcg total) by mouth Once a day at 6:00am 90 tablet 0    metoprolol succinate XL (TOPROL-XL) 50 MG 24 hr tablet Take 1 tablet (50 mg total) by mouth daily 90 tablet 3    modafinil (PROVIGIL) 200 MG tablet Take 200 mg by mouth daily      montelukast (SINGULAIR) 10 MG tablet TAKE 1 TABLET BY MOUTH EVERY NIGHT AT BEDTIME AS DIRECTED 90 tablet  3    Multiple Vitamin (multivitamin) capsule Take 1 capsule by mouth daily      oxyCODONE-acetaminophen (PERCOCET) 5-325 MG per tablet Take 1 to 2 tablets by mouth daily as needed for severe pain      pantoprazole (PROTONIX) 40 MG tablet Take 40 mg by mouth daily      polyethylene glycol (MIRALAX) 17 g packet Take 17 g by mouth daily      Probiotic Product (ALIGN PO) Take 1 tablet by mouth daily      prochlorperazine (COMPAZINE) 10 MG tablet Take 10 mg by mouth every 6 (six) hours as needed      promethazine (PHENERGAN) 25 MG suppository Place 25 mg rectally 2 (two) times daily      psyllium (METAMUCIL) 58.6 % packet Take 1 packet by mouth daily      ramipril (ALTACE) 10 MG capsule Take 2 capsules (20 mg total) by mouth nightly 180 capsule 3    simvastatin (ZOCOR) 20 MG tablet Take 1 tablet  (20 mg total) by mouth nightly (Patient taking differently: Take 40 mg by mouth nightly) 90 tablet 3    SYMBICORT 80-4.5 MCG/ACT inhaler Inhale 2 puffs into the lungs two times daily (Patient taking differently: 2 puffs 2 (two) times daily Have patient rinse mouth after use) 10.2 g 3    tamsulosin (FLOMAX) 0.4 MG Cap Take 1 capsule (0.4 mg total) by mouth Daily after dinner 90 capsule 0    Testosterone 20.25 MG/ACT (1.62%) Gel Apply 4 actuations daily (2 on each shoulder) 300 g 1    UNABLE TO FIND Nasal spray (mometosone, ipratromium, diphenhydramine) 2 spritzes BID      vitamin D (CHOLECALCIFEROL) 25 MCG (1000 UT) tablet Take 2,000 Units by mouth daily      zolpidem (AMBIEN CR) 6.25 MG CR tablet       senna (SENOKOT) 8.6 MG tablet Take 1 tablet by mouth 2 (two) times daily    (Patient not taking: Reported on 06/21/2021)       No current facility-administered medications on file prior to visit.       Vital Signs:     Vitals:    06/21/21 1524   BP: 164/78   Pulse: 64   Resp: 18   Temp: 98.3 F (36.8 C)       Physical Exam:     General: No acute distress, cooperative with examination,well developed, well nourished, no apparent distress  Psychologic: Affect appropriate, judgment and insight consistent with situation  Skin: No obvious lesions or scars  Eyes: Sclerae anicteric, no conjunctival injection  ENT: No visible otorrhea, no rhinorrhea, trachea midline  Head: Normocephalic  Neck: supple, no lymphadenopathy, no thyromegaly, no JVD  Musculoskeletal: normal muscle tone, no atrophy  Pulmonary: Normal respiratory effort, no audible wheezing  Cardiovascular: No pedal edema noted,extremities without clubbing or cyanosis  Abdominal: Non-distended     Awake, alert, oriented x3, Follows commands   Appropriate mood and affect  Speech appropriate, clear and fluent  Attention span and concentration appropriate   Fund of knowledge appropriate    CN II: PERRL  CN III, IV, VI: EOMI, no nystagmus  CN V: Normal sensation in all 3  trigeminal divisions b/l  CN VII: Face symmetric  CN VIII: Hearing intact b/l  CN IX, X: Palate elevates symmetrically  CN XI: Shoulder shrug symmetric  CN XII: Tongue midline    Musculoskeletal:    - Motor: Moves b/l UEs & LEs, strength 5/5  B/l UE &  LE   - Gait, station: Normal    - No notable atrophy   - No abnormal movements noted    Sensation intact bilaterally  No drift   Coordination: finger to nose intact  Gait: Normal     Labs:     Lab Results   Component Value Date    WBC 10.7 03/03/2021    HGB 14.8 03/03/2021    HCT 45.0 03/03/2021    MCV 92 03/03/2021    PLT 223 03/03/2021     Lab Results   Component Value Date    NA 141 05/21/2021    K 4.5 05/21/2021    CL 106 05/21/2021    CO2 25 05/21/2021     Lab Results   Component Value Date    INR 1.0 05/22/2018    PT 10.7 05/22/2018     Lab Results   Component Value Date    BUN 21.0 05/21/2021     Lab Results   Component Value Date    CREAT 1.3 05/21/2021     Imaging:     I reviewed the patient's imaging myself.     MRI BRAIN WITHOUT AND WITH CONTRAST     HISTORY: Meningioma follow-up, no new symptoms      COMPARISON: MRI brain 05/19/2020, 08/25/2018     TECHNIQUE: MRI of the brain performed on a 3.0 Tesla scanner without and  with 8 mL of Gadavist intravenous contrast.     FINDINGS:      There is minimal interval enlargement of the residual dural based enhancing  mass measuring 3.3 x 3.2 x 5.0 cm (AP x TRV x CC), previously 3.1 x 3.1 x  4.9 cm using similar measuring technique. This measured 2.8 x 2.9 x 4.4 cm  on the brain MRI from 08/25/2018. There is unchanged mild mass effect on  the adjacent left paramedian parietal occipital lobe. There is unchanged  involvement of the posterior superior sagittal sinus which appears  partially occluded.     There is minimal interval enlargement of the additional subcentimeter dural  based enhancing mass along the superior mid falx (series 10, image 29).  This measures 8 x 3 mm coronal plane, previously 6 x 3 mm using  similar  measuring technique.     There is unchanged encephalomalacia in the right parieto-occipital lobes.  There is no new intracranial mass. No evidence of acute parenchymal  hemorrhage or acute infarction.     There is no evidence of hydrocephalus. No extra-axial fluid collections.     The flow voids of the major intracranial vessels appear intact.     There are stable postsurgical findings from parietal craniotomies. The  bones and extracranial soft tissues are otherwise unremarkable.     IMPRESSION:      1.  Minimal interval enlargement of the residual meningioma along the  posterior falx with unchanged involvement of the posterior superior  sagittal sinus and local mass effect.  2.  Minimal interval enlargement of the additional subcentimeter  presumed meningioma along the superior mid falx.     Electronically signed by: Brenton Grills M.D.  [facility]     MK: 05/19/21    Impression   Patient is a 70 year old male with a complex past medical history including a meningioma which abuts the posterior third of the superior sagittal sinus.  Patient is a retired Clinical research associate.  Neurologically, the patient continues to have a left inferior quadrantanopsia.  Screening MRIs obtained by neurology demonstrates slight  interval growth of the lesion.  On review of MRI since 2006, it is seen to have doubled in size.  It was discussed with the patient that revision surgery is possible to obtain further grading and staging but that this is a risky proposition.  We will plan to discuss his case at tumor board in a multidisciplinary fashion and will contact the patient with our recommendations.  Patient expressed an understanding of the proposed course of action was in agreement.  All questions were answered.    I attest that I personally saw the patient, performed a history and physical examination and reviewed the available images on my own and discussed management with the resident. I reviewed the  resident's note and agree with the documented findings and plan of care with the additions stated in my assessment and plan section.    I have personally reviewed and personally interpreted the images pertinent for neurosurgical treatment decision making and discussed the findings with the patient. I compared the present imaging with earlier imaging studies as well.  MRI of the brain shows signs of encephalomalacia and craniotomy on the occipital region.  There is an enhancing mass involving this sagittal sinus and extending into the left occipital lobe the.  When compared to the previous MRI a year ago the mass has not changed much however when compared to 2006 after surgical resection the mass has grown in size.    I discussed with the patient surgical resection versus radiation versus continued observation.    I discussed all the risk and benefits of all treatment options.    At this time will list the patient's name to be discussed in the neuro-oncology tumor board and the notify the patient of the recommendation of our neuro-oncology tumor board.    All questions were answered and he agreed with the plan.    ===============================================================  I, Leona Carry, Shannon [name], personally performed the services documented. Shelly Flatten FNP-C is scribing for me on patient Shannon Neighbor, Shannon [name]. This note and the patient instructions accurately reflect work and decisions made by me, Leona Carry, Shannon [name].    ===================================================================  *N.B.: This note was generated by the Epic EMR system/ Dragon speech recognition and may contain inherent errors or omissions not intended by the user. Grammatical errors, random word insertions, deletions, pronoun errors and incomplete sentences are occasional consequences of this technology due to software limitations. Not all errors are caught or corrected. If there are questions or concerns about the content of this note or  information contained within the body of this dictation they should be addressed directly with the author for clarification.*              No orders of the defined types were placed in this encounter.      Follow up:   Tumor board     Thank you for the opportunity of allowing me to participate in the care of  Shannon Enid Derry, Shannon [name].    Shelly Flatten FNP-C, acted as a scribe for this encounter for Dr. Tera Helper.  I have reviewed and edited this note when appropriate and agree with the documentation.    Molli Hazard, Shannon [name] PGY4  Leona Carry, Shannon [name], MBA, [facility] FNP-C    Clinic Address:   [address]  [place] [address]  [phone] phone  [phone] fax

## 2021-06-21 ENCOUNTER — Encounter (INDEPENDENT_AMBULATORY_CARE_PROVIDER_SITE_OTHER): Payer: Self-pay | Admitting: Neurological Surgery

## 2021-06-21 ENCOUNTER — Ambulatory Visit (INDEPENDENT_AMBULATORY_CARE_PROVIDER_SITE_OTHER): Payer: Medicare Other | Admitting: Neurological Surgery

## 2021-06-21 VITALS — BP 164/78 | HR 64 | Temp 98.3°F | Resp 18 | Ht 69.0 in | Wt 187.0 lb

## 2021-06-21 DIAGNOSIS — D496 Neoplasm of unspecified behavior of brain: Secondary | ICD-10-CM

## 2021-06-21 DIAGNOSIS — D329 Benign neoplasm of meninges, unspecified: Secondary | ICD-10-CM

## 2021-06-22 ENCOUNTER — Ambulatory Visit
Admission: RE | Admit: 2021-06-22 | Discharge: 2021-06-22 | Disposition: A | Discharge status: Home / Self Care | Payer: Medicare Other | Source: Ambulatory Visit | Attending: Neurological Surgery | Admitting: Neurological Surgery

## 2021-06-22 DIAGNOSIS — D329 Benign neoplasm of meninges, unspecified: Secondary | ICD-10-CM | POA: Insufficient documentation

## 2021-06-23 ENCOUNTER — Encounter (INDEPENDENT_AMBULATORY_CARE_PROVIDER_SITE_OTHER): Payer: Self-pay

## 2021-06-30 ENCOUNTER — Other Ambulatory Visit: Payer: Self-pay | Admitting: Family

## 2021-06-30 DIAGNOSIS — D496 Neoplasm of unspecified behavior of brain: Secondary | ICD-10-CM

## 2021-06-30 NOTE — Progress Notes (Signed)
Spoke to patient about the results of the tumor board conference. Explained that his tumor has not changed much since 2017. Discussed that a gross total resection would be a high risk surgery and could make his vision worse and place him at risk for a thrombosis. It was recommended that he meet with RadOnc to discuss possible radiation. Referral placed in patients chart.

## 2021-07-02 ENCOUNTER — Encounter: Payer: Self-pay | Admitting: Internal Medicine

## 2021-07-02 ENCOUNTER — Other Ambulatory Visit: Payer: Self-pay | Admitting: Internal Medicine

## 2021-07-02 DIAGNOSIS — R351 Nocturia: Secondary | ICD-10-CM

## 2021-07-02 DIAGNOSIS — N1831 Chronic kidney disease, stage 3a: Secondary | ICD-10-CM

## 2021-07-02 DIAGNOSIS — E039 Hypothyroidism, unspecified: Secondary | ICD-10-CM

## 2021-07-02 DIAGNOSIS — I10 Essential (primary) hypertension: Secondary | ICD-10-CM

## 2021-07-02 DIAGNOSIS — Z0000 Encounter for general adult medical examination without abnormal findings: Secondary | ICD-10-CM

## 2021-07-02 DIAGNOSIS — R7301 Impaired fasting glucose: Secondary | ICD-10-CM

## 2021-07-02 DIAGNOSIS — E782 Mixed hyperlipidemia: Secondary | ICD-10-CM

## 2021-07-03 ENCOUNTER — Other Ambulatory Visit: Payer: Self-pay | Admitting: Family Medicine

## 2021-07-03 DIAGNOSIS — R3912 Poor urinary stream: Secondary | ICD-10-CM

## 2021-07-04 ENCOUNTER — Encounter (INDEPENDENT_AMBULATORY_CARE_PROVIDER_SITE_OTHER): Payer: Self-pay | Admitting: Internal Medicine

## 2021-07-05 ENCOUNTER — Encounter: Payer: Self-pay | Admitting: Internal Medicine

## 2021-07-05 ENCOUNTER — Encounter: Payer: Self-pay | Admitting: Radiation Oncology

## 2021-07-05 ENCOUNTER — Ambulatory Visit (INDEPENDENT_AMBULATORY_CARE_PROVIDER_SITE_OTHER): Payer: Medicare Other | Admitting: Internal Medicine

## 2021-07-05 ENCOUNTER — Other Ambulatory Visit (INDEPENDENT_AMBULATORY_CARE_PROVIDER_SITE_OTHER): Payer: Self-pay | Admitting: Internal Medicine

## 2021-07-05 VITALS — BP 138/84 | HR 80 | Temp 98.5°F | Resp 15 | Ht 69.5 in | Wt 190.8 lb

## 2021-07-05 DIAGNOSIS — Z86011 Personal history of benign neoplasm of the brain: Secondary | ICD-10-CM

## 2021-07-05 DIAGNOSIS — I951 Orthostatic hypotension: Secondary | ICD-10-CM

## 2021-07-05 DIAGNOSIS — D329 Benign neoplasm of meninges, unspecified: Secondary | ICD-10-CM | POA: Insufficient documentation

## 2021-07-05 DIAGNOSIS — N1831 Chronic kidney disease, stage 3a: Secondary | ICD-10-CM

## 2021-07-05 DIAGNOSIS — Z8709 Personal history of other diseases of the respiratory system: Secondary | ICD-10-CM

## 2021-07-05 DIAGNOSIS — R7301 Impaired fasting glucose: Secondary | ICD-10-CM

## 2021-07-05 DIAGNOSIS — M549 Dorsalgia, unspecified: Secondary | ICD-10-CM

## 2021-07-05 DIAGNOSIS — Z87898 Personal history of other specified conditions: Secondary | ICD-10-CM

## 2021-07-05 DIAGNOSIS — E782 Mixed hyperlipidemia: Secondary | ICD-10-CM

## 2021-07-05 DIAGNOSIS — R7401 Elevation of levels of liver transaminase levels: Secondary | ICD-10-CM

## 2021-07-05 DIAGNOSIS — G8929 Other chronic pain: Secondary | ICD-10-CM

## 2021-07-05 DIAGNOSIS — K581 Irritable bowel syndrome with constipation: Secondary | ICD-10-CM

## 2021-07-05 DIAGNOSIS — G9332 Myalgic encephalomyelitis/chronic fatigue syndrome: Secondary | ICD-10-CM

## 2021-07-05 DIAGNOSIS — I10 Essential (primary) hypertension: Secondary | ICD-10-CM

## 2021-07-05 DIAGNOSIS — Z0000 Encounter for general adult medical examination without abnormal findings: Secondary | ICD-10-CM

## 2021-07-05 DIAGNOSIS — E039 Hypothyroidism, unspecified: Secondary | ICD-10-CM

## 2021-07-05 DIAGNOSIS — M797 Fibromyalgia: Secondary | ICD-10-CM

## 2021-07-05 MED ORDER — SIMVASTATIN 40 MG PO TABS
40.0000 mg | ORAL_TABLET | Freq: Every evening | ORAL | 3 refills | Status: DC
Start: 2021-07-05 — End: 2021-09-27

## 2021-07-05 NOTE — Patient Instructions (Signed)
If you have not done so already, please sign up for Mychart (instructions should be on your visit summary).     If you see other doctors or specialists please keep me informed when you do so I can coordinate with them and ensure I have records of your visits.     - Get a flu vaccine annually in the fall. Wash hands regularly and avoid touching your face to avoid viral illnesses.   - Increase vegetables, fruits, and fiber in your diet.  - One thing reduces risk for cardiovascular disease, hypertension, diabetes, improves cholesterol, reduces cancer and dementia risk, improves anxiety / depression / sleep and helps control weight? Exercise! Try to exercise at least 150 min / week (30 min 5 days / week) and do some form of muscle strengthening 2 days per week.  - Always wear your seatbelt in the car.  - Wear sunscreen that is broad-spectrum (UVA/UVB) and at least SPF 30.  - Avoid heavy drinking of alcohol (> 1 drinks daily or > 7 drinks per week).  - Goal blood pressure is <130/80   - Brush teeth and floss daily. Have a dental exam / cleaning every 6 months  - Have your vision checked every 1-2 years.  - No one will sleep perfectly every night, but aim for 7-8 hours if you can  - Do you have an advanced directive? If so please provide our office a copy. I recommend that all adults regardless of age have an advanced directive. Below are some helpful links with information if you need help getting started.   GreatReverseMortgage.fi.pdf  MovieDeposit.com.ee.jsp    A good website for medical information is called UpToDate.  It's free for patients.  SeekStrategy.tn. The [facility] website also has good information.  TanExchange.nl   DIETARY RECOMMENDATIONS    Limit or avoid processed carbohydrates and sugars - these are your enemy (white bread, white rice, potatoes, regular pasta, soda,  chips/crackers/cookies/cakes/candy).  These spike your blood sugar making you hungrier and contribute to weight gain. Choose complex carbs: 100% whole wheat bread/pasta, brown rice, oatmeal, legumes (beans, lentils). Avoid soda, diet soda, juice and other sugary drinks entirely if you can. Drink water! Try to have 10-12 hours daily when you do not eat (e.g. no eating between 7:00 PM - 7:00 AM). Try to get adequate sleep - at least 7 hrs. If you enjoy sweets save these for days that start with "S" - try not to make them a daily habit.    Eat at least 5 servings of vegetables and fruit daily.  The more fiber - and diversity of fruits and vegetables - the better. Fiber helps you feel full so you eat less and diversity of fiber also helps your body populate with healthy gut bacteria.     Portion sizes. Most of [place] eat more than we need. If you still feel hungry after a meal, try drinking water and waiting 20 minutes - you may find the urge to eat more will pass. The hormone signal that tells your brain that you are full takes 20 minutes to take effect- so if you continue to eat until you feel full you have overeaten.    Try to limit saturated fat and trans fat in your diet ( EG red meat, vegetable oil, butter, dairy products - most cheese, 2% or whole milk, cream, etc), fried foods, and sweets (cakes/donuts/etc). Some fats are good for you - these are unsaturated fats like olive oil, canola oil,  nuts, seeds, and avocados.  Try to limit sodium to < 2000 mg per day.  Most sodium comes from processed foods (canned, frozen, pre-packaged, deli meat). Many restaurant meals have high sodium - even salads.  Fresh cooking at home is best.   Home Safety Tips  Secure all electrical cords out of the way so you do not trip over them.  Get rid of or secure all loose rugs that you could trip over.  Avoid clutter and low furniture on the floor, especially in major walkways.  Keep your home well-lighted so you can easily see where youre  going.  Place a lamp near the bed so its easy to reach.  Avoid storing things in high places so you dont have to reach or climb.  If you need a step stool, make sure its a study one with a bar to hold on to. Never use a chair.  Wear sturdy shoes that fit well.  Avoid high heels, slippery soles, and shoes that are too lose.  Also avoid walking around in socks or with bare feet.  Make sure all shower/bath tub floors have a non-slip mat.  If needed, install grab bars and/or safety seats in the shower/bath tub.  Make sure all stairs have handrails and a non-slip surface.  Make sure all stairs have a working light above them.  Consider an alarm button that you can wear in case you fall and cannot reach the telephone.

## 2021-07-05 NOTE — Progress Notes (Signed)
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 There is an additional subcentimeter   mass along the superior mid falx (series 10, image 29). This measures 8 x 3 mm.     We discussed the role of XRT in the management of meningioma. There are two options. He would do well with 5 fractions SRS or fractionated radiotherapy. I would consider fractionated radiotherapy given the size. I would recommend fractionated radiotherapy with proton beam to minimize dose spillage to normal brain and limit risk of cognitive decline and second malignancy.       Proton therapy has fewer side effects across all cancers than photon therapy, reducing costly side effects and hospitalizations during the course of treatment.   1) Dahlia Byes, Council Mechanic, Evaro, et al. Comparative Effectiveness of Proton vs Photon Therapy as Part of Concurrent Chemoradiotherapy for Locally Advanced Cancer. JAMA Oncol. 2019 Dec 26. Epub ahead of print] PubMed PMID: [id number].     Proton therapy better preserves quality of life across all cancers compared with photon therapy. Cancer survivors are living longer due to effective treatments developed in the last few years. It is more important than ever to preserve quality of life for long-term survivors.   1) Candise Bowens 2nd, Mishra MV. Quality of Life and Patient-Reported Outcomes Following Proton Radiation Therapy: A Systematic Review. J Natl Cancer  Inst. [id number]).     Proton therapy improves overall survival and reduces high grade complications for head and neck cancers compared with photon therapy. Side effects of radiation for head and neck cancer can cause malnutrition, need for feeding tubes, dehydration, and other costly side effects that can require hospitalizations. Proton therapy delivers less radiation to healthy tissue and thus reduces the incidence of these costly side effects.   1) Retta Mac, Inez Catalina, Modesto Charon [employer], et al. Charged particle therapy versus photon therapy for paranasal sinus and nasal cavity malignant diseases: a systematic review and meta-analysis. Lancet Oncol. 2014;15(9):1027-38.   2) McDonald MW, Tamela Gammon, Moore MG, et al. Acute toxicity in comprehensive head and neck radiation for nasopharynx and paranasal sinus cancers: cohort comparison of 3D conformal proton therapy and intensity modulated radiation therapy. Radiat Oncol. 2016;11:32.   3) Sharl Ma, Garden AS, Gunn GB, et al. Intensity-modulated proton beam therapy (IMPT) versus intensity-modulated photon therapy (IMRT) for patients with oropharynx cancer - A case matched analysis. Radiother Oncol. 2016;120(1):48-55.   4) Romesser PB, Cahlon O, Scher E, et al. Proton beam radiation therapy results in significantly reduced toxicity compared with intensity-modulated radiation therapy for head and neck tumors that require ipsilateral radiation. Radiother Oncol. 2016;118(2):286-92.   5) Sharl Ma, Kennieth Francois, et al. Proton Therapy for Head and Neck Cancers. Semin Radiat Oncol. 2018: 28(1): 53-63.   6) Holliday EB, Esmaeli B, Pinckard J, et al. A Multidisciplinary Orbit-sparing Treatment Approach That Includes Proton Therapy  for Epithelial Tumors of the Orbit and Ocular Adnexa. Int Mickel Baas Oncol Biol Phys. 2016;95(1):344-352.       Proton therapy can improve outcomes when used to treat brain tumors, offering reduced side effects and treatment options where no safe options exist in  the treatment of recurrent brain tumors. Proton therapy can also decrease injury to normal brain tissues in primary brain tumor treatment.   1) Hug EB, Nelle Don AF, et al. Management of atypical and malignant meningiomas: Role of high-dose, 3D-conformal radiation therapy. J Neurooncol. 2000;48(2):151-160.   2) Combs SE, Debus J, Schulz-Ertner D. Radiotherapeutic alternatives for previously irradiated recurrent gliomas. [facility] Cancer. 2007;7:167.       PLAN:     -Discussed options of 5 fraction CK vs 33 Fx proton beam.   MRV  -Pain management: Not applicable, patient not currently in pain    ONCOLOGIC DIAGNOSTIC AND TREATMENT HISTORY:     11/16/2002 - MRI Brain  - Evidence of a large right posterior meningioma measuring 5.0 mc x 3.5 mc.      12/04/2002 - MRI Brain - Large heterogenously enhancing parietal parafalcine extraaxial mass  crossing the midline with superior sagittal sinus invasion.  Findings most compatible with meningioma, perhaps atypical. MRV is recommended if  there is concern for sinus invasion documentation preoperatively.               Moderate mass effect on the right parietal lobe.      12/05/2002 - surgical pathology -   Meningioma Who Grade 1.      12/06/2002 - MRI Brain -    Postoperative changes status post resection of right parietooccipital  parafalcine meningioma.  A component of the meningioma which extends into the adjacent portion of the posteroinferiorsuperior sagittal sinus             remains as does a focal enhancing intraparenchymal component anterior to       be resection cavity.  There is a margin of restricted diffusion along the       resection cavity which likely reflects postoperative ischemic changes.         The extent of vasogenic edema and mass effect upon the right lateral           ventricle is unchanged when compared to the prior exam.                            05/27/2003 - MRI Brain -   Postoperative changes status post resection of parafalcine meningioma.  The  extent of perilesional edema has markedly diminished.  The size of  the resection cavity involving the right parietal lobe has also slightly       decreased in size.  Residual enhancing soft tissue is noted in the left         parafalcine region which is unchanged, allowing for differences in             scanning planes, when compared to the prior examination from December 06, 2002.  05/10/2004 -  MRI Brain - Stable examination with parafalcine meningioma involving the posterior  aspect of the superior sagittal sinus.  Right posterior parietal               craniotomy with focal encephalomalacia posterior right parietal lobe, unchanged.       09/24/2015 - MRI Brain -   There has been a progressive mild increase in size of the moderately enhancing extra-axial mass based along the posterior falx and involving the posterior portion of the superior sagittal sinus, now measuring axial diameters of the 2.8 x 2.4 cm as compared to 2.6 x 1.9 cm remeasured in prior exam in identical planes. Craniocaudal dimension of the mass is a 4 cm versus 3.7 cm in prior exam. The increase in dimensions is most prominent in the posterior portion, especially in the width. The parafalcine component of the mass is more prominent on the left side.     11/22/2016 - MRI Brain -    Mild enlargement of the residual/recurrent posterior parafalcine meningioma which extends into the medial left occipital lobe and invades/occludes of the inferior portion of the superior sagittal sinus.     05/19/2021 MRI Brain -    1.  Minimal interval enlargement of the residual meningioma along the posterior falx with unchanged involvement of the posterior superior  sagittal sinus and local mass effect.  2.  Minimal interval enlargement of the additional subcentimeter  presumed meningioma along the superior mid falx.                                                                                                                                             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    On steroids currently: No  On seizure medication currently: No    Prior history of neurological disease: No  Prior history of brain injury: No  Headaches: No  Speech disturbance: No  Visual disturbance: Yes - visual field testing yearly by opthomologist, Dr. Kasandra Knudsen  Seizures: No  Gait disturbance: No  Motor weakness: No  Sensory deficits: No  Paresthesias: No  Urinary incontinence: No  Fecal incontinence: No    SYSTEMIC THERAPY:     None currently    RADIATION RISK FACTORS AND HISTORY:     Prior radiotherapy: No  Prior systemic therapy: No  Cardiovascular implantable electronic device (CIED): None  Collagen vascular disease: No  Inflammatory bowel disease: No  Pregnancy status: Not applicable, patient is male    Prior reaction to IV contrast for imaging: No known prior allergy or reaction to either iodine or gadolinium contrast  Prior issues obtaining MRI: None    REVIEW OF SYSTEMS:     Fevers: No  Unintentional weight loss: No  Pain: Yes - chronic pain, fatigue and nausea from Fibromyalgia.      Fall risk  -Have you fallen two or more times in the past year: No  -Did you suffer any injuries from your falls in the past year: No    A complete 14 point review of systems was performed and was negative with the exception of pertinent positives detailed here and in the HPI.    Review of Systems   Constitutional:  Positive for fatigue. Negative for appetite change, fever and unexpected weight change.   HENT:  Negative for congestion, dental problem, ear pain, hearing loss, nosebleeds, sore throat, tinnitus, trouble swallowing and voice change.    Eyes:  Positive for visual disturbance. Negative for pain and discharge.   Respiratory:  Negative for cough, shortness of breath and  wheezing.    Cardiovascular:  Negative for chest pain, palpitations and leg swelling.   Gastrointestinal:  Positive for nausea. Negative for abdominal pain, blood in stool, constipation, diarrhea and vomiting.   Genitourinary:  Negative for difficulty urinating, dysuria, frequency, hematuria and urgency.   Musculoskeletal:  Positive for back pain. Negative for arthralgias, gait problem and joint swelling.   Skin:  Negative for rash and wound.   Neurological:  Positive for numbness. Negative for dizziness, seizures, syncope, speech difficulty, weakness and headaches.   Hematological:  Negative for adenopathy. Does not bruise/bleed easily.   Psychiatric/Behavioral:  Negative for agitation, confusion and dysphoric mood. The patient is not nervous/anxious.    Insomnia and memory loss    PAST MEDICAL AND SURGICAL HISTORY:     Past Medical History:   Diagnosis Date    Aortic regurgitation     Ascending aortic aneurysm 09/19/2018    4 cm ECHO    Asthma     Avascular necrosis 2008    R hip - did not require surgery    Bilateral cataracts     budding    Brain tumor 2003    Taken out in 2003, has returned     Calculus of kidney     Cold intolerance     Color blindness     Disorder of prostate     BPH    DJD (degenerative joint disease)     Lumbar / Sacral    Dry eyes     uses restasis eyedrops which helps (RF, CCP, CRP and ESR have been wnl in past)    Dysautonomia     Eczema 03/29/2016    Right thumb     Encounter for blood transfusion 2004    Encounter for hepatitis C screening test for low risk patient 08/2016    negative    Encounter for pharmacogenetic testing 10/2015    MEDIMAP    Fibromyalgia     Gastroesophageal reflux disease     Hemorrhoids     IBS (irritable bowel syndrome)     Lateral epicondylitis     Left    Malignant neoplasm of skin     Mitral valve insufficiency     Osteoporosis     Peripheral neuropathy 12/26/2019    Screening PSA (prostate specific antigen) 03/03/2021    normal    Sleep apnea     mild, no  cpap    Tinnitus     high pitched buzzing comes and goes - started after craniotomy for meningioma    Trigger finger of right hand, unspecified finger 04/13/2021    3rd digit        Past  Surgical History:   Procedure Laterality Date    ABLATION OF DYSRHYTHMIC FOCUS  meningioma    COLONOSCOPY  2008    ECHOCARDIOGRAM, TRANSTHORACIC  04/29/2005    63    ECHOCARDIOGRAM, TRANSTHORACIC  10/17/2016    63    EGD, COLONOSCOPY N/A 04/16/2019    Procedure: EGD, COLONOSCOPY;  Surgeon: Leatha Gilding, MD;  Location: [facility];  Service: Gastroenterology;  Laterality: N/A;  egd, colonoscopy  q1-unk, md req 45 min    EXCISION, LESION  12/03/2008    benign lesion    ORCHIOPEXY Left 1980    for intermittent torsion    Subtotal resection of posterior sagittal meningioma  12/05/2002    Partial brain resection secondary to meningioma at [facility]    TONSILLECTOMY AND ADENOIDECTOMY  as a child    TOOTH EXTRACTION Right 06/2018        FAMILY HISTORY:     His family history includes Asthma in his daughter; Dementia in his mother; Hearing loss in his mother; Migraines in his sister; No known problems in his daughter; Tuberculosis in his mother. He He indicated that his mother is alive. He indicated that his father is deceased. He indicated that his sister is alive. He indicated that both of his daughters are alive.    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 He has never used smokeless tobacco. He reports that he does not currently use alcohol after a past usage of about 1.0 standard drink per week. He reports current drug use. Drug: Marijuana.   Social History     Social History Narrative    He lives in [place] and is from [place]. Married - wife Angelique Blonder. Two daughters  (one lives in [place] and one in [place]). Retired Web designer - retired in 2016.       MEDICATIONS:     Reviewed and updated in EPIC, including:  Current Outpatient Medications   Medication Instructions    albuterol sulfate HFA (ProAir HFA) 108 (90 Base) MCG/ACT inhaler INHALE ONE PUFF BY MOUTH EVERY FOUR TO SIX HOURS AS NEEDED FOR WHEEZING    AMITRIPTYLINE HCL PO 40 mg, Oral, At bedtime    amLODIPine (NORVASC) 5 mg, Oral, 2 times daily    clonazePAM (KLONOPIN) 0.5 mg, Oral, Daily PRN    diphenhydrAMINE-lidocaine viscous-nystatin-alum & mag hydroxide-simethicone (MAGIC MOUTHWASH) suspension Oral    ezetimibe (ZETIA) 10 mg, Oral, Daily    finasteride (PROSCAR) 5 mg, Oral, At bedtime    hydrocortisone (CORTEF) 5 MG tablet Take 15 mg in am, 5 mg in early afternoon and 5 mg at 7 pm. Allow for extra doses in case of stress, illness, fever.    ibuprofen (ADVIL) 600 mg, Oral, Every 6 hours PRN    levothyroxine (SYNTHROID) 150 mcg, Oral, Daily at 0600    metoprolol succinate XL (TOPROL-XL) 50 mg, Oral, Daily    modafinil (PROVIGIL) 200 mg, Oral, Daily    montelukast (SINGULAIR) 10 MG tablet TAKE 1 TABLET BY MOUTH EVERY NIGHT AT BEDTIME AS  DIRECTED    Multiple Vitamin (multivitamin) capsule 1 capsule, Oral, Daily    oxyCODONE-acetaminophen (PERCOCET) 5-325 MG per tablet Take 1 tablet by mouth 2 (two) times daily    pantoprazole (PROTONIX) 40 mg, Oral, Daily    polyethylene glycol (MIRALAX) 17 g, Oral, Daily    Probiotic Product (ALIGN PO) 1 tablet, Oral, Daily    prochlorperazine (COMPAZINE) 10 mg, Oral, Daily PRN    promethazine (PHENERGAN) 25 mg, Rectal, 2 times daily    psyllium (METAMUCIL) 58.6 % packet 1 packet, Oral, Daily    ramipril (ALTACE) 20 mg, Oral, At bedtime    simvastatin (ZOCOR) 40 mg, Oral, At bedtime    SYMBICORT 80-4.5 MCG/ACT inhaler Inhale 2 puffs into the lungs two times daily    tamsulosin (FLOMAX) 0.4 mg, Oral, Daily after dinner    Testosterone 20.25 MG/ACT (1.62%) Gel Apply 4 actuations daily (2 on each  shoulder)    UNABLE TO FIND Nasal spray (mometosone, ipratromium, diphenhydramine) 2 spritzes BID    vitamin D (CHOLECALCIFEROL) 2,000 Units, Oral, Daily    ZOLPIDEM TARTRATE ER PO 10 mg, Oral, 1-2 days per week - Patient is taking two 5mg  tablets       ALLERGIES:     Allergies   Allergen Reactions    Bextra [Valdecoxib] Rash       PHYSICAL EXAM:     Vital signs reviewed in EPIC, including:  There were no vitals filed for this visit.  Wt Readings from Last 3 Encounters:   07/05/21 86.5 kg (190 lb 12.8 oz)   06/21/21 84.8 kg (187 lb)   03/09/21 83 kg (183 lb)     Pain: 3 out of 10 with Percocet on board  KPS: 80 - Normal activity with effort, some signs of symptoms of disease    General: Well-appearing, pleasant male, in no acute distress.  Neurologic:  steady gait, sensation grossly intact throughout, motor strength 5/5 upper and lower extremities.  Psychiatric: Alert and oriented x3, thought content appropriate, euthymic.  Head: Normocephalic, without obvious abnormality, atraumatic.     LABORATORY STUDIES:     Pertinent laboratory studies have been reviewed    Complete Blood Count:  Lab Results   Component Value Date    WBC 10.7 03/03/2021    HGB 14.8 03/03/2021    HCT 45.0 03/03/2021    MCV 92 03/03/2021    PLT 223 03/03/2021    NEUTROABS 5.9 03/03/2021     Chemistries:  Lab Results   Component Value Date    CREAT 1.3 05/21/2021    BUN 21.0 05/21/2021    NA 141 05/21/2021    K 4.5 05/21/2021    CL 106 05/21/2021    CO2 25 05/21/2021    GLU 108 (H) 05/21/2021    HGBA1C 6.2 (H) 03/03/2021    CHOL 217 (H) 03/03/2021    TRIG 299 (H) 03/03/2021    HDL 61 03/03/2021    LDL 105 (H) 03/03/2021    TSH 0.26 (L) 05/21/2021    PSA 0.141 03/03/2021    LDH 478 01/12/2007     Liver Function Testing:  Lab Results   Component Value Date    ALT 75 (H) 02/04/2021    AST 49 (H) 02/04/2021    GGT 125 (H) 01/12/2007    ALKPHOS 75 02/04/2021    BILITOTAL 0.6 02/04/2021    ALB 4.5 02/04/2021     Coagulation Studies:  Lab Results    Component Value Date    INR  1.0 05/22/2018    PT 10.7 05/22/2018       RADIOLOGY STUDIES:     Pertinent imaging has been reviewed  05/19/2021 MRI BRAIN    There is minimal interval enlargement of the residual dural based enhancing  mass measuring 3.3 x 3.2 x 5.0 cm (AP x TRV x CC), previously 3.1 x 3.1 x  4.9 cm using similar measuring technique. This measured 2.8 x 2.9 x 4.4 cm  on the brain MRI from 08/25/2018. There is unchanged mild mass effect on  the adjacent left paramedian parietal occipital lobe. There is unchanged  involvement of the posterior superior sagittal sinus which appears  partially occluded.     There is minimal interval enlargement of the additional subcentimeter dural  based enhancing mass along the superior mid falx (series 10, image 29).  This measures 8 x 3 mm coronal plane, previously 6 x 3 mm using similar  measuring technique.    IMPRESSION:      1.  Minimal interval enlargement of the residual meningioma along the  posterior falx with unchanged involvement of the posterior superior  sagittal sinus and local mass effect.  2.  Minimal interval enlargement of the additional subcentimeter  presumed meningioma along the superior mid falx.    PATHOLOGY STUDIES:     Pertinent pathology has been reviewed  12/05/2002  MENINGIOMA Grade 1        ______________________________________________________________________    He verbalized his understanding of the recommendations discussed and was in agreement with the plan. All of his questions were answered to the best of our abilities and to his apparent satisfaction. He knows to contact us with any questions or concerns. Thank you for allowing [place] to participate in the management and care of this patient.                    Sharlot Gowda, MD        Attending Radiation Oncologist        Department of Advanced Radiation Oncology & Proton Therapy        [facility]        [address]        [place], [place] [address]             T  [phone]  F [phone]        [email or web address]       My total visit time for this patient encounter was 60 minutes. This includes time spent preparing to see the patient, performing the exam, counseling patient/family, ordering and reviewing tests, medications and/or procedures, care coordination, and documenting clinical information in the medical record.    This note was generated by a voice recognition system and may contain inherent errors or omissions not intended by the user. Grammatical errors, random word insertions, deletions, pronoun errors and incomplete sentences are occasional consequences of this technology due to software limitations. Not all errors are caught or corrected. If there are questions or concerns about the content of this note or information contained within the body of this dictation they should be addressed directly with the author for clarification.

## 2021-07-05 NOTE — Progress Notes (Addendum)
Patient is here for his annual physical exam.  He is alert and oriented.  In no acute distress at this time.  Height and weight obtained and charted.    Audiology test: Patient declined hearing at this time.  Vision test:  He wears prescription eyeglasses.  He has annual eye exams.  Vision testing deferred.    Patient   Fitness Goals:  Decrease weight.  Increase muscle mass.  Decrease stress.      Documentation and review of the following:  Vitals  Allergies  Medications   Medical History  Surgical History  Depression Screening  Health Maintenance   Annual Wellness Visit Health Risk Assessment  Mini Cog  Immunizations  Pharmacy  Care Providers  Advanced Directive

## 2021-07-05 NOTE — Progress Notes (Signed)
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 He was slightly late for his appointment today but we were able to accommodate him.  He is pending fasting blood work.    He has a complex and chronic medical history is summarized below in his problem list.  His health is at baseline currently -he has chronic problems with fatigue and low energy.  He is currently undergoing additional evaluation for a meningioma which has been resected in the past but there is concern for recurrent growth and he has had evaluations recently with neurosurgery at [facility] and also with [facility].  He may be a candidate for gamma knife or CyberKnife treatment.  He is weighing options at this point.  He has had some visual field testing performed and reports some changes here based on his report from his ophthalmologist.     He has been having some sciatica and is followed by pain specialist at [facility] spine.  He takes Percocet as needed for pain but has also been taking NSAIDs.  His blood pressure is mildly elevated today.  He is followed regularly by cardiology team also in gastroenterology and endocrinology among others.    Diet: Biggest problem is eating at night. Wakes at night ever 2-3 hours - snacking on banana or apple or chips and crackers. Not heavy on red meat. Low on carbs in general. Whole wheat. Not good on veg but fruit okay. No sweets in general. 2 cups of coffee daily.     Physical Activity: no recent exercise - used to do 30 min on bike.     Sleep: Sometimes in bed all day depending on his illness - in bed at 1000 pm and up at 6 - up through the night. Wakes frequently. Sleep meds help. Heating pad at night helps his cold feet.     Review of medical and social history    Patient Active Problem List    Diagnosis Date Noted    Secondary adrenal insufficiency 01/17/2017     Priority: High     -  02/09/21: eval w Dr. Ralene Ok (endo)  Hypopit - see below 2. Hypot - continue regimen  Free hormones WNL 3. AI - continue HC  Discussed stress dosing during times of stress or illness 4. Test deficiency - discussed r/ b of testosterone therapy.  Risks include, but are not limited to, prostate disease, heart disease, stroke, and fertility issues.  Patient understands risks.  Wants to continue T Will check labs   - 01/06/21: eval w Dr. Tamera Punt: Hypopituitarism in the setting of brain surgery for meningioma. Patient is currently on [facility] for (AI), levothyroxine, testosterone and growth hormone replacement. Will order an ACTH stimulation tests to see how much of adrenal reserve he has. As far a steststerone (T), his T levels are sub-optimal and I recommended a target T level in the 440 to 500 ng/dL for optimal efficacy. Will titrate the dose of the topical androgen to achieve that goal. Repeat T level in few weeks after dose totration He can return in few months or as needed.  - 04/14/20: eval w Dr. Tamera Punt: Hypopituitarism in setting of brain surgery for meningioma. Pt is currently on HC for (AI), levothyroxine, testosterone and GH replacement. Will order ACTH stim tests to see how much adrenal reserve he has. As far teststerone (  T), T levels sub-optimal and I rec target T level in 440 to 500 ng/dL for optimal efficacy. Will titrate dose of topical androgen to achieve goal. Repeat T level in few weeks after totration He can return in few months   - 04/26/18: Dr. Beckie Salts [facility]): initial eval showed possible borderline glucocorticoid deficiency.  He has been taking supraphysiologic doses of hydrocortisone since 2017.  We do not think he has hydrocortisone absorption problem based on random serum cortisol and 24-hr values.  We believe episodes of abd pain nausea improved with SDS because of anti-inflammatory effects.  We are unable to generate unifying endocrine dx that explains sx.  Pt understands dangers of  continuing supraphysiologic GC but reluctant to reduce for fear of worsening illness.  We do feel other hormone replacement doses more appropriate.  We do not rec changes. Pt asked for referral to fibromyalgia specialist.  Pt with h/o primary hypothy has empty sella and biochemical e/o hypogonadism and GH deficiency.  Originally, ACTH test borderline, but now after long-term glucocorticoid tx he has obvious central AI.  His thyroid and GH doses are if anything on high side.  We cannot attrribute sx to underdosing.  Explained this to Dr. [name] and have contacted [facility] to see if fibromyalgia expert or clinical trial are available .  - 09/29/17 Dr. Tamera Punt endo: Hypopituitarism in setting of brain surgery for meningioma. Pt currently on HC for (AI), levothyroxine, testosterone and GH replacement. The question as I d/w him is whether he is using HC to treat non specific sx or what he refers to as "quality of life" or whether he does need high doses due to some issue w HC metabolism. I explained to him sx he experiences are not specific for adrenal disease and there is concern about chronic over exposure to corticosteroid tx. I encouraged him to taper dose of HC to physiologic dose, in range of 20 mg in am and 10 mg in pm. One can also use dose that is based on body surface. In case of suspicion of "adrenal crisis", I suggested he use one injection of Solucortef rather than high oral doses of HC for several days. I also recd opinion w Dr. Leonides Grills, Chief, Endocrinology at [facility], in order to discuss adrenal issue. I'm happy to continue to see him at [facility]. I also explained that have not prescribed GH replacement for adults so he will continue to follow with [facility]. He can return in few mos or as needed.  - 09/06/17: eval w Endocrine Dr. Eyvonne Mechanic [facility]): Although after we dx secondary AI, GH deficiency, hypogonadotropic hypogonadism, and primary hypot a/w a partially empty sella on imaging, and he had initial +  response to replacement as time gone on, he has not done well and episodes where he gets very ill w muscle aches like he always has flu nausea, vomiting, LH and in general on physiologic hydrocortisone, he awakes in am with discomfort, particularly in back , and after meds it will be several hrs until he has energy to get up and do minor tasks. Legrand Pitts, he was ill w viral illness 3 days had crisis. He was tx w 100 mg of hydrocortisone orally q 8 hrs, and sx resolved. He subsequently tapered oral hydrocortisone and last 3-4 days has been taking between 130-110 mg total per day and continues to feel well as he tapers. We are collecting 24-hr urine free cortisol on dose. His other levels are interesting as well. GH  at 0.3 mg a day. In past, this had IGF-1's at upper end of nl range to mildly elevated, now Z-score is just +1.28 and despite taking AndroGel cream on daily basis, testosterone level was 19, whereas in past this was nl. He is sure he had not forgotten testosterone. His thyroid tests similar to past with a nl free T4 of 1.5 and TSH mildly suppressed. The current ACTH is less than 1. A/P  #1 Empty sella w pituitary insufficiency  #2 Secondary AI  It is unusual that he feels well now after several days of high-dose hydrocortisone. Either he is malabsorbing hydrocortisone, he is super metabolizing hydrocortisone, he has developed cortisol resistance, or he has undx steroid-sensitive disease. There is really no evid for other disease with other testing done including multiple rheum antibodies. I did order genetic gene interaction testing and waiting for review of this from pharm to see if suggestion of metabolism abnormalities re various hormones. 24-hour urine cortisol should give hint re absorption. Plan we have developed is to continue slowly taper oral hydrocortisone, and I would note tapering sched depending on clinical response. I will get a baseline bone density and body composition, and check cortisol  level morning 2-3 hrs after he took 50 mg of Cortef, and I am going to repeat testosterone 1-2 hrs after he applied AndroGel. #3 Secondary hypogonadism, on replacement : Unclear why testosterone is so different than before where we test this. It may be lab error, but it is unlikely he did not apply testosterone. #4 GH deficiency:  unclear why same dose is resulting in lower IGF-1, but increase dose to 0.4 mg per day.  #5 Primary hypot, on replacement  There is likely pituitary component, and will maintain replacement keeping FT4 stable at 1.5.  #6 Cognitive dysfunction  evaluated a year ago w/o specific dx. He feels now after several days of higher doses of hydrocortisone thinking is clearing. Mild ascending aortic dilation and aortic insufficiency  Would rec'd repeating echo next year, #10 Meningioma  unchanged on MRI from 2008 and confirms slight increase from 2017. #11 Muscle pain  This has resolved with hydrocortisone. I doubt there is underlying muscle disease,   - 08/31/17: Pt email: advised by pcp to take 100 mg of hydrocortisone Q8H for three days and then double usual dose (25-30 mg/day) until I returned to [facility]. The rescue dosage worked like Pharmacist, community. I began to feel better almost immediately and now nausea, loss of appetite, dizziness with standing, chills, cold feet, and perceived cognitive difficulties have markedly improved. I cannot remember last time I felt this good!   - 08/25/17: Dr. Toni Amend: pt with possible adrenal insuff while under stress. Trial of hydrocortisone to 100mg  tid for 24-48 hours per [facility].          History of meningioma of the brain 11/09/2015     Priority: High     12/09/02: s/p R parieto-occipital parasagittal craniotomy for 85-90% resection of R pareito-occipital falx meningioma that originally measured 61 x 24 mm    - 07/02/21: eval w neurosurgery:  Spoke to patient about the results of tumor board conference. Explained that tumor has not changed much since 2017. Discussed  that a gross total resection would be a high risk surgery and could make his vision worse and place him at risk for a thrombosis. It was recd that he meet with RadOnc to discuss possible radiation. Referral placed in pts chart.   - 07/01/21: eval w neurosurgery Dr.  Ziu: complex past medical history including meningioma which abuts posterior third of superior sagittal sinus. Neurologically, pt continues to have a left inferior quadrantanopsia.  Screening MRIs obtained by neurology demonstrates slight interval growth of lesion.  On review of MRI since 2006, it is seen to have doubled in size.  It was discussed with pt that revision surgery is possible to obtain further grading and staging but that this is a risky proposition.  We will plan to discuss case at tumor board in a multidisciplinary fashion and will contact pt with our recs.  Pt expressed an understanding of the proposed course of action was in agreement.  I have personally reviewed and personally interpreted the images pertinent for neurosurgical treatment decision making and discussed the findings with the patient. I compared the present imaging with earlier imaging studies as well.  MRI of the brain shows signs of encephalomalacia and craniotomy on the occipital region.  There is an enhancing mass involving this sagittal sinus and extending into the left occipital lobe the.  When compared to the previous MRI a year ago the mass has not changed much however when compared to 2006 after surgical resection the mass has grown in size. I discussed with the patient surgical resection versus radiation versus continued observation. I discussed all the risk and benefits of all treatment options. At this time will list the patient's name to be discussed in the neuro-oncology tumor board and the notify the patient of the recommendation of our neuro-oncology tumor board. All questions were answered and he agreed with the plan.  - 06/30/21 met with neurosurgery Dr.  Jenita Seashore a [facility] - pending Rad Onc visit with them Dr. Alain Honey  - 07/27/20: eval w Dr Nilda Calamity: His meningioma is stable and asymptomatic.  He has chronic pain depression and insomnia.  He is doing much better recently as nausea resolved presumably because he weaned his Belbuca and his insomnia and fatigue improved with Ambien and Belsomra alternating nightly.  He is not experiencing headaches.  His meningioma is stable but he should have a repeat study in 1 year.  I will see him after that  - 04/29/20: eval w Dr. Nilda Calamity: His meningioma seems to be progressive in size over past year.  In my estimation although this is not noted on the report.  Is asymptomatic.  I would like him to get a repeat scan in about 6 mos to ensure there is no acceleration in growth.  His neuro symptoms of nausea are of uncertain etiology.  I reiterated that meningioma is not causing nausea or dizziness.  Is a chronic occluded superior sagittal sinus which is not likely to be true triggering symptoms either but is relevant so I would like him to have an evaluation from ophthalmology to exclude increased intracranial pressure.  He had a GI work-up that was negative.  He is not endorsing headaches or neck pain but he does have severe trigger point in the right shoulder girdle that could be triggering some nausea potentially.  Advised him to get a trigger point injection for that.  Would be reasonable for him to try Emgality as a migraine prophylactic with very little downside given his intractable symptoms currently.  Follow-up in about 6 weeks  - 03/06/20: eval w Dr. Nilda Calamity: He is due for MRI for meningioma which does border on posterior cingulate cortex which is important in memory function some some concern about clinical effects of tumor progression.  His meningioma seems to be progressive in size  over past year in my estimation although this is not noted on the report.  It is asymptomatic.  I would like to get a repeat scan in 6  months.  Follow-up after testing complete  - MRI 12/11/19: IMP:   Stable posterior parafalcine meningioma with continued segmental occlusion of the superior sagittal sinus.   - 09/02/19: eval w Dr. Nilda Calamity (neurology): He is due for MRI for meningioma which does border on posterior cingulate cortex is important in memory function.  Order for MRI provided if this normal I would rec repeat study in a year.  If abnormal would have him schedule for follow-up to discuss further  - 09/03/18: MRI: IMP: Modest increase in residual left parafalcine tumor volume noted when compared back to 2018 but wo significant change when compared to 2019.  - 09/06/17: electronic eval w [name] (neurosurg-[facility]): Dr. Rosanne Ashing residual occipital parasagittal meningioma appears stable between 2017 - 2019 though it enlarged a little between 2008 - 2017. It is now calcifying, suggesting that further growth unlikely. I do not think it is responsible for ongoing symptoms. I would suggest follow up MRI in 2 years.          Chronic fatigue syndrome with fibromyalgia 01/24/2013     Priority: High     Aug 2006 noted onset of severe fatigue and myalgias. Extensive eval for CT and autoimmune disease at [facility] / [facility]. Discovered to have inadequate thyroid and low free testosterone. He was eval by endocrine ultimately dx w fibromyalgia / chronic fatigue syndrome prescribed various meds including provigil, cymbalta, gabapentin and pregabalin, and elavil (all w/o improvement).     - 06/05/20: Pt is interested in trial of Valtrex to help with potential treatment of fibromyalgia.  I told him there are no clear data or guideline or recs for use of this in ps with his medical history (he has done research on own suggesting it could be of benefit).  He is interested in doing a short trial of medication at suppressive dose to see if this also helps with symptoms. I reviewed risks benefits and side effects of medication with him and he would like to proceed.   Rec trial for 90 days.   - 03/30/20: eval w Dr. Romilda Garret (ID): Chronic fatigue brain fog fibromyalgia unresponsive to medical therapies.  Orthostatic hypotension.  Glossitis.  Poor sleep quality.  Recommend labs for the research follow-up in 4 weeks  - 01/21/20: eval w Rheum Dr. Mellody Dance - FMS - has not responded to medical therapies. No new options have come to clinical care since FDA approval of duloxetine, pregabalin, milnacripran for these symptoms Dysautonomia - commonly associated with FMS and other forms of central sensitivity state. Not known to be a cause and effect relationship.  - 10/03/18: email from Dr. Jon Billings: [facility] for paraneoplastic antibodies that can be a/w peripheral neuropathy negative.  I strongly suspect numbness in feet developed is prediabetes (A1C 6.3). Lose weight and exercise. The only lab of concern is B6 over 80. I don't feel this is causing neuropathy. Elevated B6 has been shown to cause neuropathy, primarily small fiber, in animal and likely does in pts as well.  I get concerned if level is >100, but 80s not far from that. If you are taking B6 (I.e, pyridoxine), I suggest stop. I also want to emphasize though we chased this, I don't believe this is cause (prediabetes, B6, or small fiber neuropathy) of majority of your sx. You have chronic fatigue syndrome and  I would continue to advise you to seek care with specialty center for this challenging disorder.   - Skin biopsy 08/06/18: Nl epidermal nerve fiber density in two distal sites, but markedly reduced over proximal thigh. [place] red negative. SUMMARY: Unclear significance of reduced density of IENFD over proximal thigh. May reflect meralgia paresthetica, but not apparent from pt's sx. No indication of length-dependent small fiber neuropathy on testing. If present, extremely mild and restricted to feet. If no meralgia paresthetica, could consider Sjogren's testing, if not done, and anti-Hu for non-length dependent small fiber  neuropathy.  - 08/06/18: eval w Dr Bosie Clos [facility]): h/o fatigue and myalgias in 2006 after having viral prodrome continues to be c/w chronic fatigue.  Additionally, insomnia with difficulty falling sleep maintaining sleep.  He reports sleeps no more than 1-1.5 hrs at time.  This likely contribute sig to fatigue and somnolence.  Rec trial of amitriptyline.  Amitriptyline may also benefit chronic pain.  For chronic pain/myalgias he is taking tramadol, ( which we rec stopping ) buprenorphine and clonazepam.  We advised him to minimize exposure to opiates and BZD as these can make somnolent fatigued and be factor in causing cognitive issues.  Is not clear what caused his paraphasic errors at [facility] 07/30/18, but suspect fatigue / meds played role.  He reports 1 yr of numbness and tingling in feet.  Exam sig only for mildly decreased sensation to temp bilateral feet - intact sensation pinprick.  He also reports episodes of nausea dry heaving.  To eval for possible small fiber neuropathy causing peripheral numbness or tingling and possible gastric dysmotility suggest skin bx which he elected to pursue. In addition to episodes of nausea and dry heaving.  He also reports having multiple episodes of loose stools and abd cramping.  Suspect component of IBS, but rec further eval w GI.  Plan.  For insomnia in setting of chronic fatigue -amitriptyline 10 mg at night for 1 week, then every week, increase by 10 mg to a dose of 40 mg.  Rec stopping tramadol skin biopsy for WU of small fiber neuropathy.  Consider eval at [facility] for chronic fatigue.  GI referral for eval of loose stools and cramping.  Continue w neurosurgery or [facility] for meningioma.  - 09/08/17: eval with Rheum ([facility]): A/P #1 Pituitary insufficiency  #2 Secondary AI  #3 Myalgias  #4 Sicca sx: suspicion is pt has underlying rheum process that accounts for steroid responsive sx here is exceedingly low. The  constellation of sx he describes during a crisis not typical for autoimmune disease. Inflammatory myopathy would typically present with muscle weakness, and muscle strength preserved, and he is endorsing myalgia. I reviewed pic he brought in with him for episodes in hands / feet, and I am at loss. It does not look like CRPS, erythemalgia, or Raynaud's. He has had extensive testing here so far including ANA, ENAs, complements, all within nl limits as are inflammatory markers. To complete eval, I think we can check cryoglobulins, ANCAs, CCP, and pain, which seems myofascial than intra-articular or muscular, would rec obtaining x-rays of spine as well as feet ankles. I will review results on portal.        Orthostatic hypotension 12/27/2019     Priority: Medium     - improved as of 11/22  - eval w Cardiology [facility] 03/09/21: IMP: 1.  Systemic hypertension with orthostasis, complex and related probably to underlying fibromyalgia and probable  dysautonomia. 2.  Hypertensive heart disease on echo 09/10/2018 he has LVH and mild aortic root dilatation.  Echo similar but did not show LVH.  Aortic root increased from 4.0 to 4.3 cm but 4.2 cm on recent CT scan. 3.  Dyslipidemia-simvastatin use. 4.  Partial resection of meningioma in 2004. 5.  Panhypopit-on replacement therapy. 6.  Peripheral neuropathy and cognitive issues. 7.  Fibromyalgia and chronic fatigue syndrome. 8.  Recent vague dyspnea perhaps due to thyroid medication changes-resolved.  No ischemic changes on EKG RECS: Change metoprolol to 50 mg once a day pt will continue to monitor pressure closely and let us know if this is not working Return office visit next available with Dr. Tami Ribas  - eval w Cardiology 12/09/20: 1.  Systemic hypertension with orthostasis, complex and related probably to underlying fibromyalgia and probable dysautonomia.  Improved recently with better nutrition and resolution of nausea.  Permitting range of blood pressures although  sitting blood pressure is fine today at 118/80. 2.  Hypertensive heart disease on echo 09/10/2018 he has LVH and mild aortic root dilatation.  Echo last week was similar but did not show LVH.  Aortic root dilatation had increased from 4.0 to 4.3 cm but 4.2 cm on recent CT scan. 3.  Dyslipidemia-simvastatin use. 4.  Partial resection of meningioma in 2004. 5.  Panhypopituitarism-on replacement therapy. 6.  Peripheral neuropathy and some cognitive issues. 7.  Fibromyalgia and chronic fatigue syndrome. 8.  Recent vague dyspnea perhaps due to thyroid medication changes-resolved.  No ischemic changes on EKG and blood work was reassuring. Recommendations: 1.  Continue current program.  Little to add . 2.  Return perhaps in a year and in a couple of years we will repeat his echo to check on his mild aortic insufficiency and his mildly dilated aortic root.  - eval w Cardiology 11/27/20: I rec repeating echo.  This will not only allow [place] to eval shortness of breath but it was also not wants to correlate aortic size to recent CT measurements.  Presuming measurements correlate well he can continue to be monitored periodically with echo going forward Otherwise continue cardiac meds.  I have refilled all of his cardiac med He will follow up with his primary cardiologist Dr. Tami Ribas later this month as previously scheduled  - eval w Cardiology Dr. Tami Ribas 06/02/20: 1.  Orthostasis, complex and related probably to underlying fibromyalgia and probable dysautonomia.  Improved recently with better nutrition and resolution of nausea. 2.  Hypertensive heart disease on echo 09/10/2018 he has LVH and mild aortic root dilatation. 3.  Dyslipidemia-simvastatin use. 4.  Partial resection of meningioma in 2004. 5.  Panhypopituitarism-on replacement therapy. 6.  Peripheral neuropathy and some cognitive issues. 7.  Fibromyalgia and chronic fatigue syndrome. Recs: 1.  No changes to medications at this time. 2.  Continue gradual increase in exercise and  thigh-high compression stockings with avoidance of dehydration or sodium depletion. 3.  I will see him again in 6 months and we can discuss when to do his next echo to look at his his aorta size.   - eval w cardiolog NP 05/14/20: 1.        Decrease his Ramipril form 20mg  to 10mg  to assist with correcting orthostasis. If his sitting BP is or above, he can take the additional tab of Ramipril 2.         Continue hydration, liberalize salt intake, compression stockings 3.         Continue monitoring blood pressure  and heart rate at home. 4.         Continue to followup with his neurologist, PCP and GI specialists 5.         Follow-up with Dr. Tami Ribas as scheduled in approximately 3 weeks   - eval w [facility] NP 03/16/20: Complex symptomatic orthostatic hypotension with spells of nausea and fatigue, likely related to fibromyalgia and probable dysautonomia.  He has had workups at [facility] and [facility] and sees specialists locally. Hypertension,  not ideally controlled although would not be overly aggressive in lowering blood pressures due to #1. Longstanding mild AV regurgitation with a mildly dilated asc aorta, stable by most recent echo Jan 2020. on statin and Zetia.  Most recent LDL 76 2019. Prior partial meningioma resection in 2004. Panhypopituitarism. Hypothyroidism. Peripheral neuropathy. Cognitive issues. Fibromyalgia and chronic fatigue syndrome, which remain quite debilitating. REC: 1. Continue current meds. 2. Continue monitoring pressure and rate 3. Follow-up with Dr. Tami Ribas in 3 to 4 months, sooner as needed.   - eval w [facility] Bad Cardiology NP 02/05/20 Complex symptomatic orthostatic hypotension, likely related to fibromyalgia and probable dysautonomia.  Several recent changes to antihypertensive regimen with subsequent improvement both in blood pressures and symptoms. Hypertension, controlled on current regimen although still with high blood pressure spikes. Longstanding mild aortic  valve regurgitation with a mildly dilated ascending aorta, stable by most recent echocardiogram in January 2020. Hyperlipidemia, on statin and Zetia.  Most recent LDL 76 in 2019. Prior partial meningioma resection in 2004. Panhypopituitarism. Hypothyroidism. Peripheral neuropathy. Cognitive issues. Fibromyalgia and chronic fatigue syndrome, which are quite debilitating. RECS: 1. Increase Toprol to 50 mg daily.  He will take at bedtime. 2. Continue remainder of cardiac medications, including higher dose of amlodipine. 3.  Continue use of thigh-high compression stockings.  These have helped significantly. 4. Continue monitoring pressure.  5. Telehealth follow-up with an APP in 3 weeks, sooner as needed.  - eval w cardiology Dr. Tami Ribas 01/16/20: 1.  Orthostasis, complex and related probably to underlying fibromyalgia and probable dysautonomia.  I explained salt loading despite hypertension may be useful to him and that 1 out of 5 patients is sodium sensitive with hypertension.  If his hypertension worsens he can of course back off.  I told him thigh-high compression stockings are more effective than anything knee-high and he will try those.  He already has rx from Danford Bad, NP.  He is going to insist on thigh-high.  We also discussed restarting a BB but a beta 1 selective beta-blocker such as metoprolol succinate 2.  heart disease on echo 09/10/2018 he has LVH and mild aortic root dilatation. 3.  Dyslipidemia-simvastatin use. 4.  Partial resection of meningioma in 2004. 5.  Panhypopituitarism-on replacement therapy. 6.  Peripheral neuropathy and some cognitive issues. 7.  Fibromyalgia and chronic fatigue syndrome. Rec: 1.  Thigh-high compression stockings 20 to 30 mm graded 2.  Begin metoprolol succinate 25 mg daily.  We may need to increase that to twice daily ultimately. 3.  Loosen salt restriction.  He will even attempt careful salt loading. 4.  He has a follow-up visit in a couple of weeks with the APP.           Irritable bowel syndrome with constipation / Cyclic vomiting syndrome 02/11/2019     Priority: Medium     - 06/03/21: eval w GI PA Mardene Celeste May: Chronic constipation lower abdominal cramping likely multifactorial and related to medication side effects IBS.  He has daily  bowel movements much improved with addition of MiraLAX 1 capful daily Metamucil powder daily.  Continue senna 1 pill twice daily in the morning and evening.  Rec decreasing senna to once in morning and eventually limiting to only as needed.  This may be playing a role in morning awakenings with cramping and urge to have bowel movement.  Increase MiraLAX to 2 capsules a day as needed.  Continue Metamucil.  Trial magnesium citrate 500 mg nightly.  Pending response to above consider Amitiza Linzess was costly.  He did not try this though mainly due to not wanting to add another prescription medication.  Chronic nausea potentially from prior brain injury brain tumor pituitary injury medication side effect reflux continue pantoprazole try adding Pepcid Complete 1 to 2 pills at bedtime.  Continue antinausea medications as needed  - 05/06/21 eval w Dr. Jodie Echevaria: Daily bowel movements but harder stools and incomplete evacuation.  Not improved with Motegrity and stopped last year due to cost.  Rec bowel cleanout with MiraLAX recommend limiting senna to as needed.  Start Linzess 72 mcg daily.  Use MiraLAX 1 to 2 capsules daily as needed.  He takes amitriptyline primarily for sleep but this can help with IBS pain control but can be constipating as well.  Add Pepcid Complete 1 to 2 pills at bedtime.  Continue pantoprazole.  Continue nausea medications as needed.  Currently using Phenergan suppositories 25 mg twice daily and Compazine as needed.  Follow-up 4 weeks  - 05/21/20: Normal solid-phase gastric emptying study  - 04/30/20 - eval w Dr Jodie Echevaria: He has been for infusions of IV fluid 3 times since last visit for nausea and vomiting.  Continued on Motegrity TCA and xifaxan  -though meningioma is growing its not thought to be cause.  He is starting higher dose of hydrocortisone due to adrenal insufficiency.  He only has a few hours a day with no nausea.  He actually has not vomited but nausea is unrelenting.  Neurology gives him granisetron every few weeks.  Amitriptyline.  ACTH challenge was done by endocrinology.  His most to start a scopolamine patch today and consider holding Belbuca meds.  Continue scopolamine patch continue pantoprazole continue Zofran Phenergan follow-up 4 weeks  - 04/23/20 eval with Dr. Jodie Echevaria: At last visit held the nighttime TCA and this did not make a difference.  He recently started it back up because of insomnia and this helped.  Held Motegrity for breath test but symptoms recurred so he did restart it and the Motegrity helped.  He was quite nauseous and went to neurologist and was given granisetron IV and another migraine medication a markedly improved.  Still uses Phenergan suppository and only with occasional breakthrough.  Meningioma is thought to have grown again and borderline in size for resection.  He is going back in a month where he has no cycles.  Continue pantoprazole Zofran promethazine start amitriptyline 25 mg nightly continue Motegrity start Xifaxan 3 times a day for IBS with diarrhea 14 days.  Follow-up 6 weeks  - 03/02/20: eval w Dr. Jodie Echevaria: He is unable to repeat SIBO test due to family issues.  He stopped monitor antihypertensive due to this.  Nausea remains bad in morning but better in afternoon and evening.  Phenergan as needed.  This does help.  He does think amitriptyline helps sleep.  Bowel movements controlled a "life saver" on Motegrity.  Continue Phenergan Zofran and Compazine stop amitriptyline.  Option of holding TCA for now and seeing if improvement  on office to do latter for now.  Continue Motegrity follow-up 3 weeks  - 01/17/20: TCA was decreased due to some orthostasis.  Linzess held and started on [employer].  He feels much better  for several reasons.  The dysautonomia and hypotension correlated with nausea.  Coreg was held today no nausea but still some orthostasis but less severe.  Compression stockings and salt recommended.  Recommend start Motegrity follow-up 6 weeks  - 12/20/19: eval w Dr. Jodie Echevaria: He was continued on Linzess TCA antiemetics Protonix at last visit.  He was diagnosed with SIBO and given trial xifaxan.  He did not have much improvement with it.  He continued to have symptoms of nausea and dizziness.  Phenergan suppository.  Some orthostatic hypotension.  He does not have POTS but may be dysautonomia based on male work-up.  He does have adrenal insufficiency.  He continues to complain of loss of appetite fatigue weight loss.  He was somewhat dehydrated but able to rally and drink more.  He was having constipation and nausea so changed the Linzess to Motegrity which he finds helpful.  Continue Zofran twice daily as needed.  Continue Compazine as needed.  He was doing well on TCA but with the orthostasis we will cut down the dose to 20 mg nightly. Stop Xifaxan and linzess - start motegrity. Start phenergan suppository.  Decreased dose TCA increase fluids recommend cardiology visit.  Follow-up 4 weeks  - [facility] / EGD 04/16/19: A few medium mouth diverticula in the sigmoid colon.  The entire colon appeared normal.  Biopsies were taken  EGD Dr Jodie Echevaria 04/16/19: Examined esophagus nl.  Biopsies taken.  Small hiatal hernia present.  Patchy mild inflammation found in gastric body in gastric antrum. PATH: Normal gastric antral mucosa negative for gastritis H. pylori herpes viral cytopathic changes intestinal metaplasia or dysplasia.  Gastric body mucosa showing mild superficial erosive injury negative for inflammation Helicobacter herpes viral cytopathic changes intestinal metaplasia or dysplasia.  GE junction biopsy: Possible reflux GE mucosa showing chronic inflammation and hyperplastic changes negative for intestinal metaplasia or dysplasia.   Mid esophagus biopsy: Esophageal candidiasis squamous mucosa showing superficial acute inflammation and intraepithelial fungal elements consistent with Candida species    - 12/18/18: eval w GI Dr. Jodie Echevaria: looks better than last visit.  nausea reflux better.  may have fibromyalgia and possibly AI being managed by specialist.  He continues to struggle with fatigue.  An extensive work-up for LFTs unremarkable besides fatty liver.  He only needs pantoprazole twice a week.  He has Compazine and Phenergan for bad episodes but has not needed it.  He remains on hydrocortisone.  The fatigue continues but manageable.  Trying to get back on Provigil but awaiting approval.  No pain or changes in bowel habits.  Cramps are better.  He added a yogurt shake and fruit and prunes.  Rec continue current GI meds follow-up in 6 months  - 10/09/18 eval w GI Dr. Donell Sievert: last visit given Phenergan and pantoprazole.  [place] with fatty liver and left renal cyst.  Labs neg HCV HBV.  Neg for other causes of abnl LFT.  Has been seen at [facility] and nerve biopsy done with nonspecific findings.  He is watching sugar.  He reports less nausea less incapacitation.  He is on new pain med and this works.  He is on Elavil at bedtime sleeps better.  He does feel better still fatigue but try to fight through it.  He is seeing endocrinologist at [facility]  and [facility].  Nausea and reflux better with treatment.  Rec low carbe diet.  Lean protein and low-fat diet.  Gave him names of endocrinologist and rheum to consult with.  Follow-up in 3 months          Aortic valve insufficiency 09/07/2018     Priority: Medium     - ECHO 12/03/20: Conclusions: 1. Normal LV function.   2. Mild aortic valve regurgitation.  3. The proximal ascending aorta is moderately dilated at 4.3  cm.  4. 09/19/2018 echo showed 4 cm ascending aorta size and LVH.  Wall thicknesses are normal on the current study, likely due to interstudy technical variability. Chest CT showed aorta  size to be  4.2 cm 11/24/2020.   - 09/07/18: eval w cardiology Dr. Harlow Asa: recommend echo, continue current antihypertensives, FU 1 year  -ECHO at [facility] 2018: described mild AR and mildly enlarged aortic root LV nl      IFG (impaired fasting glucose) 02/13/2018     Priority: Medium     - A1C 6.2 as of 03/09/21  - A1C 6.2 as of 11/27/19. - A1C 6.3 09/06/18. - A1C 6.2 02/14/18 FBG 104      History of insomnia 01/17/2017     Priority: Medium     Chronic, with previous evaluations with multiple providers    - 05/21/21: eval w Dr. Theodoro Grist: Insomnia has been controlled with combination of amitriptyline and zolpidem as needed at night.  On rare occasion he will take modafinil.  No major flareups since last visit.  Updated brain MRI pending.  Continue amitriptyline 40 mg at night and zolpidem twice per week.  Modafinil as needed.  Clonazepam as needed.  Follow-up 6 months  -11/14/20 eval w Dr. Theodoro Grist: Pt continues to benefit from taking amitriptyline 40 mg at night.  He also alternates between Belsomra as zolpidem at night.  Pt does not mix meds.  Continue current meds.  On rare occasions pt will take modafinil.  Rarely takes clonazepam.  Maintain Toprol and Motegrity.  Follow-up ECG -follow-up in 6 months or sooner as needed  -07/27/20 eval w Dr. Theodoro Grist: He recently recovered from an increased bout of insomnia.  He reports he took Ambien 10 mg for short period to help reset his sleep cycle consider Belsomra in the future.  Follow-up in 4 months or as needed  - 07/12/20: Apt with Dr. Theodoro Grist (sleep) and I am to double Ambien (total 10 mg), Ambien ER 6.25 mg and Belsomra to see which works best. So far doubling Ambien or Belsomra works best. Its interesting, after a few nights of double Ambien, my brain seems to have reset and I can go to sleep without anything. This is nice since I dont want to use a sleep aid every night. Its amazing how much better I feel with more sleep, particularly REM sleep. I have been using a fit watch to verify the total  amount of sleep I get including REM sleep. Dr. Theodoro Grist also gave me a refill for Clonazepam. The last script was 7/27 #30 and I still have 20 left so I am using it sparingly.   - 07/09/20 eval w Dr. Theodoro Grist: Chronic sleeping difficulties in the setting of fibromyalgia chronic pain mood disorder and circadian rhythm disorder.  Symptoms have not been well controlled with combination of melatonin 10 mg and amitriptyline 40 mg and with occasional use of Ambien.  Belsomra was initially considered but his insurance limiting.  He will try Ambien  ER 6.25 mg.  Side effects reviewed.  Follow-up 2 weeks  - appears pt has received clonazepam from Dr. Theodoro Grist on 04/10/20, 03/17/20 and 01/27/20  - 02/09/20: eval w Dr. Theodoro Grist: Continues to benefit from taking melatonin and amitriptyline at night.  Occasionally he will take Ambien.  Symptoms well controlled with combination of melatonin and amitriptyline 50 mg at night.  Complete sleep study in future.  Continue modafinil follow-up 5 months  - Feb1, 2021: Eval with Dr. Eilleen Kempf: Sx well controlled with combination melatonin 10 mg and amitriptyline 50 mg at night.  Complete sleep study once pandemic has subsided.  Continue modafinil follow-up 6 months  - now seeing CBT therapist Dr. Lanier Ensign  - 10/31/16: eval w sleep medicine Dr. Ala Dach  [facility]): reviewed records from sleep study Sept 2017 and CPAP in Oct 2017 ... My opinion  his OSA is not sig enough to require effort on using CPAP consistently ... Particularly since he reports not having found CPAP helpful or restful, best to set it aside and at some point another sleep lab study and titration may be helpful.       Amnestic MCI (mild cognitive impairment with memory loss) 11/20/2016     Priority: Medium     - had dizziness on donepezil and remeron - Lamoille'd both -     - 04/17/19: eval w Neurology Dr. Nilda Calamity: Multiple medical problems and reduced quality of life due to chronic pain insomnia and fatigue and memory concerns.  He would  like to try a new possible tx for chronic fatigue and fibromyalgia involving treatment of possible viral pathogens.  I told him I am not comfortable treating him this way with famciclovir and Celebrex but I rec he see another physician interest in fibromyalgia and chronic fatigue as well as possible viral connections with conditions.  Referral made.  I will continue to follow for memory concerns and meningioma.  I have no suspicion for Alzheimer's.  The meningioma does border on posterior cingulate cortex which is important in memory function and will be monitored periodically.  He sees Dr. Theodoro Grist for insomnia and daytime sleepiness.  I will see him again virtually in a few months  - 11/27/18: eval w Neurology Dr. Nilda Calamity: Multiple medical problems and reduced quality of life due to chronic pain insomnia and fatigue with more recent concerns about memory.  At this point based on history and exam as well as review of records I have no reason to suspect Alzheimer's type dementia.  The meningioma is bordering on aspects of the posterior cingulate cortex which is important in memory function so this is potentially a cause.  However I am skeptical of the MCI diagnosis and I would think that abbreviated follow-up testing is important.  He has severe daytime fatigue and sleepiness and unrefreshing sleep.  He is seeing Dr. Theodoro Grist for further advice on this.  I will prescribe Provigil for his daytime fatigue as I intended to previously.  His meningioma needs continual follow-up.  Although there was no obvious growth with the recent exam I did feel that the meningioma seemed a bit more robust and greater in volume than it did in 2017 but this may have been due to the greater degree of gadolinium uptake which was probably a technical issue.  His understanding was that the meningioma was calcified and unlikely to grow.        Growth hormone deficiency 03/29/2016     Priority: Medium     -  Jan 2019: eval w Dr. Madie Reno [facility]) see recs  under adrenal insufficiency in problem list  -- 10/31/16: eval w Endocrine Dr. Tawanna Cooler [facility]): increase GH to 0.4 mg daily  - 08/26/16:  eval w Dr. Tawanna Cooler [facility]): suspect that these issues will improve longer he is on [facility] .Marland KitchenMarland Kitchen Therefore am not concerned with him taking 7.5 mg -10 mg of pred a day or equiv if that is what he needs in next several months.           Chronic back pain / Narcotic Dependence 11/09/2015     Priority: Medium     - 01/2021: pt email I believe last time I used a mouth rinse and it worked but am happy to try troches. The pain clinic stopped Belbuca since it was not working and contributing to nausea. Now I take one Percocet  BID and this has markedly improved quality of life. Still have bad days with fatigue and pain, but overall things improved. I also again started Provigil QD. This is improving fatigue. These changes have enabled me to do things as longas I pace myself. Just had another basal cell carcinoma removed this week.   - 07/12/20 pt email: Today I had an appointment with the pain clinic. I have weaned down to 300 mcg Belbuca QD from 450 mcg BID. I have no nausea and this controls the chronic pain. They would prefer that I take 150 mcg BID since the half life is 12 hours and it is always more difficult to catch up with the pain. I am still trying to wean down to the lowest dose that controls the pain.   - eval w [facility] 12/02/19: Managing with Belbuca which he reports improving pain significantly.  Since initiating Belbuca pain is a 3-4.  He has been able to [place] all other meds including tramadol and gabapentin.  - Eval with [facility] 07/12/17: Rec tramadol for severe breakthrough pain.  MRI of cervical spine ordered.  Continue at-home exercises and stretches and physical therapy.  Follow-up in 4 weeks for further evaluation  - As of May 2018 pt reports he gets pain medication and klonazepam from [facility]  - 07/01/16: note from Dr. Toni Amend:  I will no  longer refill percocet, clonazepam, or provigil as no disease indications. I advised he will need  pain specialist provide. Pt has shown no e/o abuse, but has been on meds for years w no benefit. Pt currently being tx for panhypopit and AI, hypogonadism, and hypot GH deficiency. The pt knows to wean clonazepam and asked for 30 days of tramadol to wean from percocet. Note he is only on a half tablet of percocet qid. No refills will be provided. Pt aware.   - 2005: developed R hip and leg pain and was told he had "small central disk herniation" at L5-S1. Surgery not rec'd. He received PT and some epidural corticosteroid injections which improved but did not completely resolve situation. Low back and leg pain recurred in 2006 and was again tx w steroid and PT.       Essential hypertension 01/24/2013     Priority: Medium     Treated with amlodipine and ramipril and toprol      Mixed hyperlipidemia 01/24/2013     Priority: Medium     Pt reports he has taken statin for many years (was taken off to see if this would help with fibromyalgia pain / wo improvement) and  zetia and tricor. As of June 2019 not on tricor (concern with zetia and tricor contributing to risk of myopathy)    TC 217 TRI 299 HDL 61 LDL 105 03/04/21 - rec increase simvastatin to 40 mg daily repeat lipids 3 mo  TC 177 TRI 237 HDL 54 LDL 76 02/14/18          Primary hypothyroidism 01/24/2013     Priority: Medium     Check FT4 in addition to TSH for him    - 05/21/21: TSH 0.26 with normal FT4 and FT3  - 02/05/21 TSH 0.428 with normal FT as of   - 01/06/21: I am no longer taking Cytomel since I developed the side effect of severe dyspnea  - 11/19/20: Nl FT4  - 11/25/20 pt email: stopped cytomel and sx of tachycardia and SOB resolving. Think I should avoid all T3 in future? Questioning how much levothyroxine now. Waiting for response Oursman  - 09/22/20 FT4 wnl on current regimen  - 07/13/20 pt email: Had apt. with endo - to decrease Levothyroxin to 125 mg and add  Cytomel 5 mg. The added T3 may help me get started in morning. Will do this for month and then check levels. ACTH challenge test to be repeated.    - FT4 wnl as of 11/28/19. - FT4 7.4 wnl as of 09/06/18  - 03/12/18: email from endocrin Dr. Madie Reno at [facility]: " re thyroid replacement dose. TSH not reliable. All you really need in addition is FT4 which he states nl.. You values here in past have been ~1.5. If it is still in this range I would not change dose. Re testosterone, it would be good to get free testosterone or bioavailable testosterone along with total to be sure dose is correct. Re GH, if you have only been off for a week or two, can start back on same dose. If it has been longer you should probably start at 0.2 mg/day for 2 weeks then increase to 0.3 mg.         History of asthma 01/24/2013     Priority: Medium     - since childhood    - eval w Dr. Myles Gip 04/25/2019: Continue current medications asthma under good control Symbicort twice daily plus Singulair  - 11/23/16: eval w Dr. Renita Papa (Pulm at [facility]): bronchial asthma mild, allergic rhinitis postnasal drainage: uses albuterol BID for chest tightness. Check nitric oxide test. He has had intermittent eosinophilia and this points towards an eosinophilic asthma phenotype. He does have allergic rhinitis and think he would be better servied with our triple combination nose spray which includes mometasone, diphenhydramine and iptratroprium. I gave him rx for this.       BPH (benign prostatic hyperplasia) 01/24/2013     Priority: Medium     As of 11/13/19 pt noting diminished urinary flow - req trial of flomax 0.4 mg qhs -       Basal cell carcinoma (BCC) of skin of nose 01/29/2018     Priority: Low     Dx in June 2019 recommended for MOHS in July 2019     - eval w Dr. Rickard Patience 01/02/18: s/p biopsy L nasal area      Spondylosis of cervical region without myelopathy or radiculopathy 08/09/2017     Priority: Low     - MRI C Spine w/o contrast: C2-C3: Small  posterior disc bulge partially effacing the ventral CSF and gently indenting the ventral cervical spinal cord.  There is no underlying cord signal abnormality.  There is no central canal nor is there foraminal narrowing.  C3-C4: There is a disc osteophyte complex with a superimposed posterior central disc protrusion which partially effaces the ventral CSF and indents the ventral cervical spinal cord.  There is no central canal stenosis or foraminal narrowing.  C4-C5: There is a disc osteophyte complex with prominent posterior lateral disc component within the foramina bilaterally, right greater than left.  Posterior disc bulge partially effaces the ventral CSF and indents the right ventral cervical spinal cord.  There is mild central canal stenosis.  There is no foraminal narrowing.  C5-C6: There is disc space narrowing and disc desiccation.  There is a disc osteophyte complex with prominent uncovertebral hypertrophy, right greater than left.  Posterior disc bulge partially effaces the ventral CSF.  There is no central canal stenosis.  There is mild to moderate right-sided foraminal encroachment.  The left foramen is patent.  C6-C7: There is disc space narrowing and disc desiccation.  There is a disc osteophyte complex with right posterior lateral disc herniation partially effacing the ventral CSF and mildly flattening.  The underlying cervical spinal cord.  There is marked right-sided foraminal encroachment as well as mild left-sided foraminal narrowing.  There is facet arthropathy.  C7-T1: There is no evidence of central canal or foraminal stenosis.  Overall impression: Multilevel cervical spondylosis as described in detail above.  - evaluation w [facility]. Penni Bombard Nov 2018      Hemianopia, homonymous, left 09/15/2015     Priority: Low     - 09/04/17: eval w Dr. Jerre Simon [facility]): He has a L homonymous hemianopia from prior occipital lobe meningioma and resection. I will get baseline visual field since  last exam 2 years ago. He reports dry eye. Prior SSA and SSB were negative. Schirmer's test showed normal tear production. The dry eye is mostly from meibomian gland dysfunction and therefore unrelated to Sjogren's. His main visual complaint is blurred vision and tunnel vision during "spells" that he attributes to low cortisol. He is currently being tx with cortisone and has minimal sx. A lack of cortisone would not typically cause visual sx, but if blood pressure dropping, this could be contributing. He is scheduled for autonomic testing later this week. Addendum: 24-2 Humphrey visual field showed a small left inferior homonymous scotoma      Secondary male hypogonadism 01/24/2013     Priority: Low     - - 10/31/16: eval w Endocrine Dr. Tawanna Cooler [facility]): testosterone level is excellent right now - continue current therapy          Trigger middle finger of right hand 04/19/2021     04/13/21: Evaluation with Dr. Salvatore Marvel had injection for the finger follow-up as needed      Transaminitis 09/19/2020     AST 49 and ALT 75 as of 02/05/21  AST 54 and ALT 94 as of 12/10/20  ALT 53 as of 09/18/20  02/11/19 ABDOMINAL CT: IMP: 1. Large stool content in colon with moderate stool distention of rectum. Mild adjacent perirectal stranding. Findings may be secondary to constipation. 2. Nonobstructive right renal calculi. Left renal cyst. 3. Fatty infiltration of liver      Trapezius strain, right, initial encounter 07/09/2020     06/05/20: Based on his clinical presentation and MRI findings a strain of the trapezius is suggested.  I recommended a trial of physical therapy initially rather than doing an MRI of  the chest wall.  He is amenable to this.   - Ambulatory referral to Physical Therapy      Stage 3a chronic kidney disease 05/14/2020    Onychomycosis 02/13/2018    Xerostomia 12/21/2017     eval w dentist in April 2019 and recommended for tx w evoxac      Left testicular pain 09/13/2017     09/07/17 evaluation w Urology at  [facility]: PLAN: 1 Left Testicular Pain We discussed current symptomatology. We discussed results of ultrasound which does not note concerning pathology to account for left-sided testicular pain. He has noted improvement with use of scrotal support. I did discuss with him consideration of referral to our PMR colleagues, potential PT for pelvic floor assessment or question as to whether nerve block would be appropriate. At this point in time the patient would like to defer anything invasive and is reassured with negative imaging.              Recurrent sinusitis 01/17/2017     nasal spray from [facility], [address] [place], [place], [phone] I originally got it from [facility] to help prevent recurrent sinusitis and it works. You were kind enough to prescribe it for me on 01/04/21 since I no longer go to [facility] (Rx# [id number], 90 day supply, 72 ml).  I believe this was the refill date since as I recall, the original script from you had one refill.  Diphenhydramine/Ipratropium/Mometasone 0.033/0.02/0.033% nasal spray - 2 spritzes each side twice a day   - Pt email 09/18/20:  Mometasone / ipratroprium / diphenhydramine nose spray was rx by ENT during my first trip to [facility]. At the time I felt obstructed on left side. The ENT used a scope and said only findings were a deviated septum and congestion. I have had a chronic problem with sinusitis usually requiring antibiotics from time to time. Since using nose spray, no antibiotics needed. I also do a sinus rinse several times a day. Otherwise my nose gets really congested and contributes to insomnia.   - 11/21/16: eval w ENT Dr. Jannette Spanner [facility]): For recurrent sinusitis, he is on good medical regiment w daily nasal irrigations. Surgery would be indicated for 4-5 episodes of sinusitis per year. He was not interested in operative intervention and this is reasonable. In re to his deviated septum septoplasty could be considered. He was not interested at this  time. To address his nasal obstruction on a mucosal basis I recommended triple spray. Avoid afrin. Offered him fluoroscopic eval of his dysphagia but he deferred. Would not recommend surgery for OSA.        Past Medical History:   Diagnosis Date    Aortic regurgitation     Ascending aortic aneurysm 09/19/2018    4 cm ECHO    Asthma     Avascular necrosis 2008    R hip - did not require surgery    Bilateral cataracts     budding    Brain tumor 2003    Taken out in 2003, has returned     Calculus of kidney     Cold intolerance     Color blindness     Disorder of prostate     BPH    DJD (degenerative joint disease)     Lumbar / Sacral    Dry eyes     uses restasis eyedrops which helps (RF, CCP, CRP and ESR have been wnl in past)  Dysautonomia     Eczema 03/29/2016    Right thumb     Encounter for blood transfusion 2004    Encounter for hepatitis C screening test for low risk patient 08/2016    negative    Encounter for pharmacogenetic testing 10/2015    MEDIMAP    Fibromyalgia     Gastroesophageal reflux disease     Hemorrhoids     IBS (irritable bowel syndrome)     Lateral epicondylitis     Left    Malignant neoplasm of skin     Mitral valve insufficiency     Osteoporosis     Peripheral neuropathy 12/26/2019    Screening PSA (prostate specific antigen) 03/03/2021    normal    Sleep apnea     mild, no cpap    Tinnitus     high pitched buzzing comes and goes - started after craniotomy for meningioma    Trigger finger of right hand, unspecified finger 04/13/2021    3rd digit     Past Surgical History:   Procedure Laterality Date    ABLATION OF DYSRHYTHMIC FOCUS  meningioma    COLONOSCOPY  2008    ECHOCARDIOGRAM, TRANSTHORACIC  04/29/2005    63    ECHOCARDIOGRAM, TRANSTHORACIC  10/17/2016    63    EGD, COLONOSCOPY N/A 04/16/2019    Procedure: EGD, COLONOSCOPY;  Surgeon: Leatha Gilding, MD;  Location: [facility];  Service: Gastroenterology;  Laterality: N/A;  egd, colonoscopy  q1-unk, md req 45 min    EXCISION,  LESION  12/03/2008    benign lesion    ORCHIOPEXY Left 1980    for intermittent torsion    Subtotal resection of posterior sagittal meningioma  12/05/2002    Partial brain resection secondary to meningioma at [facility]    TONSILLECTOMY AND ADENOIDECTOMY  as a child    TOOTH EXTRACTION Right 06/2018     Current Outpatient Medications   Medication Sig Dispense Refill    albuterol sulfate HFA (ProAir HFA) 108 (90 Base) MCG/ACT inhaler INHALE ONE PUFF BY MOUTH EVERY FOUR TO SIX HOURS AS NEEDED FOR WHEEZING 25.5 g 1    AMITRIPTYLINE HCL PO Take 40 mg by mouth nightly         amLODIPine (NORVASC) 5 MG tablet Take 1 tablet (5 mg total) by mouth 2 (two) times daily 180 tablet 3    clonazePAM (KlonoPIN) 0.5 MG tablet Take 1 tablet (0.5 mg total) by mouth daily as needed for Anxiety 30 tablet 0    diphenhydrAMINE-lidocaine viscous-nystatin-alum & mag hydroxide-simethicone (MAGIC MOUTHWASH) suspension Take by mouth      ezetimibe (ZETIA) 10 MG tablet Take 1 tablet (10 mg total) by mouth daily 90 tablet 3    finasteride (PROSCAR) 5 MG tablet Take 1 tablet (5 mg total) by mouth nightly 90 tablet 3    hydrocortisone (CORTEF) 5 MG tablet Take 15 mg in am, 5 mg in early afternoon and 5 mg at 7 pm. Allow for extra doses in case of stress, illness, fever. 600 tablet 2    ibuprofen (ADVIL) 200 MG tablet Take 3 tablets (600 mg) by mouth every 6 (six) hours as needed for Pain      levothyroxine (SYNTHROID) 150 MCG tablet Take 1 tablet (150 mcg total) by mouth Once a day at 6:00am 90 tablet 0    metoprolol succinate XL (TOPROL-XL) 50 MG 24 hr tablet Take 1 tablet (50 mg total) by mouth daily 90 tablet 3  modafinil (PROVIGIL) 200 MG tablet Take 1 tablet (200 mg) by mouth daily      montelukast (SINGULAIR) 10 MG tablet TAKE 1 TABLET BY MOUTH EVERY NIGHT AT BEDTIME AS DIRECTED 90 tablet 3    Multiple Vitamin (multivitamin) capsule Take 1 capsule by mouth daily      oxyCODONE-acetaminophen (PERCOCET) 5-325 MG per tablet Take 1 tablet by  mouth 2 (two) times daily      pantoprazole (PROTONIX) 40 MG tablet Take 1 tablet (40 mg) by mouth daily      polyethylene glycol (MIRALAX) 17 g packet Take 17 g by mouth daily      Probiotic Product (ALIGN PO) Take 1 tablet by mouth daily      prochlorperazine (COMPAZINE) 10 MG tablet Take 1 tablet (10 mg) by mouth daily as needed      promethazine (PHENERGAN) 25 MG suppository Place 1 suppository (25 mg) rectally 2 (two) times daily      psyllium (METAMUCIL) 58.6 % packet Take 1 packet by mouth daily      ramipril (ALTACE) 10 MG capsule Take 2 capsules (20 mg total) by mouth nightly 180 capsule 3    simvastatin (ZOCOR) 40 MG tablet Take 1 tablet (40 mg) by mouth nightly 90 tablet 3    SYMBICORT 80-4.5 MCG/ACT inhaler Inhale 2 puffs into the lungs two times daily (Patient taking differently: 2 puffs 2 (two) times daily Have patient rinse mouth after use) 10.2 g 3    tamsulosin (FLOMAX) 0.4 MG Cap Take 1 capsule (0.4 mg total) by mouth Daily after dinner 90 capsule 3    Testosterone 20.25 MG/ACT (1.62%) Gel Apply 4 actuations daily (2 on each shoulder) 300 g 1    UNABLE TO FIND Nasal spray (mometosone, ipratromium, diphenhydramine) 2 spritzes BID      vitamin D (CHOLECALCIFEROL) 25 MCG (1000 UT) tablet Take 2 tablets (2,000 Units) by mouth daily      ZOLPIDEM TARTRATE ER PO Take 10 mg by mouth 1-2 days per week - Patient is taking two 5mg  tablets       No current facility-administered medications for this visit.     Allergies   Allergen Reactions    Bextra [Valdecoxib] Rash     Immunization History   Administered Date(s) Administered    COVID-19 mRNA Vaccine Bivalent Tris-Sucrose 12 Years And Up Booster [employer]) 30 mcg/0.3 mL 04/30/2021    COVID-19 mRNA vaccine 12 years and above (Moderna) 100 mcg/0.5 mL 09/06/2019, 10/04/2019, 04/01/2020, 12/09/2020    INFLUENZA HIGH DOSE 65 YRS+ 05/22/2014, 05/23/2015, 04/28/2016, 05/02/2018, 04/03/2019    INFLUENZA HIGH DOSE 65 YRS+ Quad 0.7 mL 05/20/2020, 05/20/2021     Influenza (Im) Preservative Free 05/13/2010    Influenza (Im) Preserved TRIVALENT VACCINE 05/23/2011, 05/07/2013, 05/23/2016    Influenza Vacc QUAD Recombinant PF 17yrs & up 05/03/2017    Novel Influenza-H1N1-09, injectable 06/19/2008    Pneumococcal 23 valent 01/22/2013, 01/19/2017, 09/04/2018    Pneumococcal Conjugate 13-Valent 11/06/2015    Tdap 01/22/2013    Zoster (SHINGRIX) Vaccine Recombinant 01/19/2017, 03/22/2017    Zoster (ZOSTAVAX) Vaccine 06/25/2014     Family History   Problem Relation Age of Onset    Tuberculosis Mother     Dementia Mother     Hearing loss Mother     Migraines Sister     Asthma Daughter         in [place]    No known problems Daughter  in [place]     Social History     Socioeconomic History    Marital status: Married   Tobacco Use    Smoking status: Never    Smokeless tobacco: Never   Vaping Use    Vaping Use: Never used   Substance and Sexual Activity    Alcohol use: Not Currently     Alcohol/week: 1.0 standard drink     Types: 1 Drinks containing 0.5 oz of alcohol per week     Comment: rarely    Drug use: Yes     Types: Marijuana     Comment: in edible form    Sexual activity: Not Currently     Partners: Female     Birth control/protection: None   Social History Narrative    He lives in [place] and is from [place]. Married - wife Angelique Blonder. Two daughters (one lives in [place] and one in [place]). Retired Web designer - retired in 2016.     Patient Care Team:  Modesto Charon, MD as PCP - General (Internal Medicine)  Irineo Axon, MD as Consulting Physician (Neurological Surgery)  Espinel, Clemencia Course., MD as Consulting Physician (Surgery)  Royetta Crochet, MD as Consulting Physician (Pulmonary Disease)  Fredric Dine, MD as Consulting Physician (Ophthalmology)  Lauralee Evener, DO as Consulting Physician (Pain Medicine)  Ernie Hew, MD as Consulting Physician (Dermatology)  Vania Rea, MD as Consulting Physician (Neurology)  Marisa Sprinkles, MD as  Consulting Physician (Pulmonary Disease)  Leatha Gilding, MD as Consulting Physician (Gastroenterology)  Ricke Hey, MD as Consulting Physician (Neuromuscular Medicine)  Vira Blanco, MD as Consulting Physician (Cardiology)  Mariana Kaufman, MD as Consulting Physician (Ophthalmology)  Einar Gip, MD as Consulting Physician (Neurology)  Modesto Charon, MD  Daine Gravel, RN as Registered Nurse  Lyn Hollingshead, NP as Nurse Practitioner (Nurse Practitioner)  Ernestina Penna, NP as Nurse Practitioner (Nurse Practitioner)  Harvel Quale, MD as Consulting Physician (Cardiology)  Maida Sale as Registered Nurse  Cowden, Elease Hashimoto, RN as Registered Nurse  Jonathon Bellows, MD as Consulting Physician (Infectious Disease)  Dreama Saa, [place] as Physician Assistant (Physician Assistant)  Stevie Kern, NP as Nurse Practitioner (Nurse Practitioner)  Olga Coaster, MD as Consulting Physician (Hand Surgery)  Olga Coaster, MD as Consulting Physician (Hand Surgery)  May, Joanna I, [place] as Physician Assistant (Physician Assistant)  Deloria Lair, MD as Consulting Physician (Endocrinology, Diabetes and Metabolism)  West Pugh, MD as Consulting Physician (Ophthalmology)    Review of Risk Factors for Depression and Mood Disorders  Result of depression screen: negative       Health Risk Assessment:   During the past month, how would you rate your general health?:  Fair  Which of the following tasks can you do without assistance - drive or take the bus alone; shop for groceries or clothes; prepare your own meals; do your own housework/laundry; handle your own finances/pay bills; eat, bathe or get around your home?: Drive or take the bus alone, Shop for groceries or clothes, Prepare your own meals, Do your own housework/laundry, Eat, bathe, dress or get around your home, Handle your own finances/pay bills  Which of the following problems have you been bothered by in the past month - dizzy when  standing up; problems using the phone; feeling tired or fatigued; moderate or severe body pain?: Feeling tired or fatigued, Moderate or severe body pain  Do  you exercise for about 20 minutes 3 or more days per week?:No  During the past month was someone available to help if you needed and wanted help?  For example, if you felt nervous, lonely, got sick and had to stay in bed, needed someone to talk to, needed help with daily chores or needed help just taking care of yourself.: Yes  Do you always wear a seat belt?: Yes  Do you have any trouble taking medications the way you have been told to take them?: No  Have you been given any information that can help you with keeping track of your medications?: No  Do you have trouble paying for your medications?: No  Have you been given any information that can help you with hazards in your house, such as scatter rugs, furniture, etc?: No  Do you feel unsteady when standing or walking?: No  Do you worry about falling?: No  Have you fallen two or more times in the past year?: No  Did you suffer any injuries from your falls in the past year?: No     Hospitalizations:   Hospitalization within past year: [x]  No  []  Yes    Diagnosis:      Screenings:   Ambulatory Screenings 07/05/2021 07/05/2021 07/05/2021   Falls Risk: De Hollingshead more than 2 times in past year - N N   Falls Risk: Suffer any injuries? - N N   Depression: PHQ2 Total Score 0 - -   Depression: PHQ9 Total Score 0 - -        Substance Use Disorder Screen:  In the past year, how often have you used the following?  1) Alcohol (For men, 5 or more drinks a day. For women, 4 or more drinks a day)  [x]  Never []  Once or Twice []  Monthly []  Weekly []  Daily or Almost Daily  2) Tobacco Products  [x]  Never []  Once or Twice []  Monthly []  Weekly []  Daily or Almost Daily  3) Prescription Drugs for Non-Medical Reasons  [x]  Never []  Once or Twice []  Monthly []  Weekly []  Daily or Almost Daily  4) Illegal Drugs  [x]  Never []  Once or Twice []   Monthly []  Weekly []  Daily or Almost Daily             Functional Ability/Level of Safety:   Falls Risk/Home Safety Assessment:  ( see HRA and Screenings sections for additional assessment)  Home Safety: []  Stair handrails  []  Skid-resistant rugs/remove throw rugs  []  Grab bars    []  Clear pathways between rooms  []  Proper lighting stairs/bathrooms/bedrooms  Get Up and Go (optional):  []   <20 secs  []   >20 secs    []   High risk for falls - Home Safety/Falls Risk Precautions reviewed with pt/family    Hearing Assessment:  Concerns for hearing loss: []  Yes  [x]   No  Hearing aids:   []   Right  []   Left  []   Bilateral   [x]   None  Whisper Test (optional):  []  Normal  []   Slightly decreased  []   Significantly decreased    Exercise:  Frequency:  [x]   No formal exercise  []   1-2x/wk  []   3-4x/wk  []   >4x/wk  Duration:  []   15-30 mins/day  []   30-45 mins/day  []   45+ mins/day  Intensity:  []   Light  []   Moderate  []   Heavy      Activities of Daily Living:   ADL's Independent Minimal  Assistance Moderate  Assistance Total   Assistance   Bathing [x]  []  []  []    Dressing [x]  []  []  []    Mobility [x]  []  []  []    Transfer [x]  []  []  []    Eating [x]  []  []  []    Toileting [x]  []  []  []      IADL's Independent Minimal  Assistance Moderate  Assistance Total   Assistance   Phone [x]  []  []  []    Housekeeping [x]  []  []  []    Laundry [x]  []  []  []    Transportation [x]  []  []  []    Medications [x]  []  []  []    Finances [x]  []  []  []       ADL assistance: [x]  No assistance needed  []  Spouse  []  Sibling  []  Son  []  Daughter  []  Children  []  Home Health Aide []  Other:       Advance Care Planning:   Discussion of Advance Directives:   []  Advance Directive in chart  [x]  Advance Directive not in chart - requested to provide  []  No Advance Directive.  Form Provided  []  No Advance Directive.  Pt declines.  []  Not addressed today  []  Other:     Evaluation of Cognitive Function:   Mood/affect: [x]  Appropriate  []   Other:   Appearance: [x]  Neatly groomed  [x]   Adequately nourished  []  Other:  Family member/caregiver input: []  Present - no concerns  []   Not present in room  []  Present - concerns:    Cognitive Assessment:  Mini-Cog Result (three word registration- banana, sunrise, chair / clock drawing):   []   > 3 points - negative screen for dementia   []  3 recalled words - negative screen for dementia   [x]  1-2 recalled words and normal clock draw - negative for cognitive impairment   []  1-2 recalled words and abnormal clock draw - positive for cognitive impairment   []  0 recalled words - positive for cognitive impairment         Review of Systems  CONST: 7 pound weight gain since July no fevers/chills sweats, + fatigue, muscle aches  EYES: No new blurry vision, double vision, loss of peripheral vision  EARS: No hearing loss, tinnitus, pain or discharge.   NOSE: No congestion, runny nose or bloody nose  MOUTH: No sore throat, oral lesions, difficulty swallowing  NECK: No swollen glands, stiffness or pain  CV: No CP, palpitations, leg swelling, changes in exercise tolerance  RESP: No new SOB, cough, wheeze, asthma  GI: No new n/v, diarrhea, constipation, abd pain, heartburn, blood in stool  MALE GU: No dysuria, frequency, hematuria, nocturia  MSK:  No new joint or muscle pain, swelling or weakness  SKIN:   No rashes lumps, sores or concerning moles.   NEURO:  No HA, LH, dizziness, LOC, numbness/tingling, weakness  PSYCH:  No new depressed mood, anhedonia, anxiety    Examination    BP 138/84 (BP Site: Left arm, Patient Position: Sitting, Cuff Size: Medium)    Pulse 80    Temp 98.5 F (36.9 C) (Oral)    Resp 15    Ht 1.765 m (5' 9.5")    Wt 86.5 kg (190 lb 12.8 oz)    SpO2 98%    BMI 27.77 kg/m    Wt Readings from Last 3 Encounters:   07/05/21 86.5 kg (190 lb 12.8 oz)   06/21/21 84.8 kg (187 lb)   03/09/21 83 kg (183 lb)     GEN: BMI 27 male alert and appropriate in no  apparent distress  HENT: tympanic membrane normal bilaterally, no nasal congestion, oropharynx normal w  no exudate or erythema  EYES: PERRL, EOMI, no pallor or scleral icterus, normal conjunctiva  NECK: supple, no thyromegaly or nodules appreciated  LYMPH: no cervical or supraclavicular lymphadenopathy appreciated  CV: RRR, no m/r/g.  No LE edema.  2+ radial pulses present and equal.  RESP: CTAB, normal effort, no rhonchi or rales  GI: soft, nontender/ nondistended, NABS, no rebound or guarding, no evident hepatosplenomegaly  NEURO: PERRLA, EOMI, face symmetric, hearing intact to voice, palate raise, shoulder shrug and neck flexion intact, tongue midline. Moves all extremities.  PSYCH: normal mood and affect    1. Encounter for Medicare annual wellness exam  Anticipatory guidance given. Recommended routine blood work as ordered below for future    2. History of meningioma of the brain  Appreciate consultation by neurosurgery and recommended he follow-up with radiation oncology and neurosurgery at [facility] to help him make decision on best options for treatment going forward    3. Mixed hyperlipidemia  Refill granted await LDL  - simvastatin (ZOCOR) 40 MG tablet; Take 1 tablet (40 mg) by mouth nightly  Dispense: 90 tablet; Refill: 3    4. Irritable bowel syndrome with constipation / Cyclic vomiting syndrome  Symptoms stable continue current medication and routine follow-up with his gastroenterologist    5. IFG (impaired fasting glucose)  Await A1c and blood work    6. History of insomnia  Continue current medication    7. Orthostatic hypotension  He reports on current dose of medication that the symptoms have been better continue current blood pressure management    8. Chronic fatigue syndrome with fibromyalgia  Continue current medications    9. Chronic kidney disease, stage 3a  Await chemistry and routine labs    10. Primary hypothyroidism  TSH recently normal continue current medications    11. Essential hypertension  Reasonable if not ideal control continue current medications    13. Chronic back pain,  unspecified back location, unspecified back pain laterality  [facility]    14. Transaminitis  Await LFTs    15. Stage 3a chronic kidney disease  Recommended avoiding NSAIDs and await chemistry    Gloriajean Dell, MD  [facility] [address]  [address], [address]  [place], [place] [address]  P) [phone]  F) [phone]  [email or web address]    Additional Documentation:

## 2021-07-06 ENCOUNTER — Ambulatory Visit: Payer: Self-pay | Admitting: Radiation Oncology

## 2021-07-06 DIAGNOSIS — D329 Benign neoplasm of meninges, unspecified: Secondary | ICD-10-CM

## 2021-07-06 MED ORDER — HYDROCORTISONE 5 MG PO TABS
ORAL_TABLET | ORAL | 2 refills | Status: DC
Start: 2021-07-06 — End: 2021-09-20

## 2021-07-19 ENCOUNTER — Other Ambulatory Visit: Payer: Self-pay | Admitting: Internal Medicine

## 2021-07-19 DIAGNOSIS — J4540 Moderate persistent asthma, uncomplicated: Secondary | ICD-10-CM

## 2021-07-19 MED ORDER — MONTELUKAST SODIUM 10 MG PO TABS
ORAL_TABLET | ORAL | 3 refills | Status: AC
Start: 2021-07-19 — End: ?

## 2021-07-22 ENCOUNTER — Encounter (INDEPENDENT_AMBULATORY_CARE_PROVIDER_SITE_OTHER): Payer: Self-pay | Admitting: Cardiology

## 2021-07-23 ENCOUNTER — Telehealth (INDEPENDENT_AMBULATORY_CARE_PROVIDER_SITE_OTHER): Payer: Self-pay | Admitting: Cardiology

## 2021-07-23 ENCOUNTER — Emergency Department
Admission: EM | Admit: 2021-07-23 | Discharge: 2021-07-23 | Disposition: A | Discharge status: Home / Self Care | Payer: Medicare Other | Attending: Emergency Medical Services | Admitting: Emergency Medical Services

## 2021-07-23 ENCOUNTER — Emergency Department: Payer: Medicare Other

## 2021-07-23 DIAGNOSIS — R0609 Other forms of dyspnea: Secondary | ICD-10-CM | POA: Insufficient documentation

## 2021-07-23 DIAGNOSIS — Z20822 Contact with and (suspected) exposure to covid-19: Secondary | ICD-10-CM | POA: Insufficient documentation

## 2021-07-23 LAB — CBC AND DIFFERENTIAL
Absolute NRBC: 0 10*3/uL (ref 0.00–0.00)
Basophils Absolute Automated: 0.09 10*3/uL — ABNORMAL HIGH (ref 0.00–0.08)
Basophils Automated: 0.8 %
Eosinophils Absolute Automated: 0.18 10*3/uL (ref 0.00–0.44)
Eosinophils Automated: 1.6 %
Hematocrit: 45.2 % (ref 37.6–49.6)
Hgb: 15 g/dL (ref 12.5–17.1)
Immature Granulocytes Absolute: 0.1 10*3/uL — ABNORMAL HIGH (ref 0.00–0.07)
Immature Granulocytes: 0.9 %
Lymphocytes Absolute Automated: 3.14 10*3/uL (ref 0.42–3.22)
Lymphocytes Automated: 28.1 %
MCH: 30.1 pg (ref 25.1–33.5)
MCHC: 33.2 g/dL (ref 31.5–35.8)
MCV: 90.8 fL (ref 78.0–96.0)
MPV: 10.6 fL (ref 8.9–12.5)
Monocytes Absolute Automated: 0.88 10*3/uL — ABNORMAL HIGH (ref 0.21–0.85)
Monocytes: 7.9 %
Neutrophils Absolute: 6.78 10*3/uL — ABNORMAL HIGH (ref 1.10–6.33)
Neutrophils: 60.7 %
Nucleated RBC: 0 /100 WBC (ref 0.0–0.0)
Platelets: 240 10*3/uL (ref 142–346)
RBC: 4.98 10*6/uL (ref 4.20–5.90)
RDW: 13 % (ref 11–15)
WBC: 11.17 10*3/uL — ABNORMAL HIGH (ref 3.10–9.50)

## 2021-07-23 LAB — BASIC METABOLIC PANEL
Anion Gap: 11 (ref 5.0–15.0)
BUN: 21 mg/dL (ref 9.0–28.0)
CO2: 27 mEq/L (ref 17–29)
Calcium: 9.5 mg/dL (ref 7.9–10.2)
Chloride: 104 mEq/L (ref 99–111)
Creatinine: 1.2 mg/dL (ref 0.5–1.5)
Glucose: 124 mg/dL — ABNORMAL HIGH (ref 70–100)
Potassium: 4.2 mEq/L (ref 3.5–5.3)
Sodium: 142 mEq/L (ref 135–145)

## 2021-07-23 LAB — B-TYPE NATRIURETIC PEPTIDE: B-Natriuretic Peptide: 23 pg/mL (ref 0–100)

## 2021-07-23 LAB — GFR: EGFR: 59.5

## 2021-07-23 LAB — COVID-19 (SARS-COV-2) & INFLUENZA  A/B, NAA (ROCHE LIAT)
Influenza A: NOT DETECTED
Influenza B: NOT DETECTED
SARS CoV 2 Overall Result: NOT DETECTED

## 2021-07-23 LAB — IHS D-DIMER: D-Dimer: <0.22 ug/mL FEU (ref 0.00–0.60)

## 2021-07-23 LAB — TROPONIN I: Troponin I: <0.01 ng/mL (ref 0.00–0.05)

## 2021-07-23 NOTE — ED Provider Notes (Signed)
EMERGENCY DEPARTMENT HISTORY AND PHYSICAL EXAM      [facility]       Patient Information     Patient Name: Shannon Neighbor, MD  Encounter Date:  07/23/2021  Patient DOB:  [birth year]  MRN:  [id number]  Room:  09/A09  Rendering Provider: Jimmy Picket FNP-BC     History of Presenting Illness     Chief Complaint: shortness of breath  Historian: self  Onset: "years"  Quality: aching  Location: generalized   Duration: intermittent      HPI Comments:   70 y.o. male with pert PMHx of ascending aortic aneurism, aortic regurgitation,meningioma , mitral valve insufficiency, asthma, CKD 3 , adrenal insufficiency, GERD IBS fibromyalgia, eczema. Pt presents to ED with ED with shortness of breath that is waxing and waning over the past 2 months.  This week patient was dropping off his cousin to an airport while walking up a mildly incline ramp felt shortness of breath and needed to stop to catch his breathing.  Patient reports he is not exercising and leading sedentary life due to chronic pain and fibromyalgia.  Patient reports sleeping flat in bed.  Patient reports no nausea no vomiting.  Patient has no wheezing, patient does not suspect asthma exacerbation.  Patient reports he checks his oxygen at home all the time and the lowest number he seen was 95%.  On arrival to ED patient is asymptomatic he reports no chest pain no palpitations no shortness of breath appears to be very comfortable.  No leg edema noted.  Patient reports no weight gain.    Cardiology Dr. Trinna Balloon  PMD: Modesto Charon, MD    Past Medical History     Past Medical History:   Diagnosis Date    Aortic regurgitation     Ascending aortic aneurysm 09/19/2018    4 cm ECHO    Asthma     Avascular necrosis 2008    R hip - did not require surgery    Bilateral cataracts     budding    Brain tumor 2003    Taken out in 2003, has returned     Calculus of kidney     Cold intolerance     Color blindness     Disorder of prostate     BPH    DJD  (degenerative joint disease)     Lumbar / Sacral    Dry eyes     uses restasis eyedrops which helps (RF, CCP, CRP and ESR have been wnl in past)    Dysautonomia     Eczema 03/29/2016    Right thumb     Encounter for blood transfusion 2004    Encounter for hepatitis C screening test for low risk patient 08/2016    negative    Encounter for pharmacogenetic testing 10/2015    MEDIMAP    Fibromyalgia     Gastroesophageal reflux disease     Hemorrhoids     IBS (irritable bowel syndrome)     Lateral epicondylitis     Left    Malignant neoplasm of skin     Mitral valve insufficiency     Osteoporosis     Peripheral neuropathy 12/26/2019    Screening PSA (prostate specific antigen) 03/03/2021    normal    Sleep apnea     mild, no cpap    Tinnitus     high pitched buzzing comes and goes - started after craniotomy for meningioma    Trigger  finger of right hand, unspecified finger 04/13/2021    3rd digit       Past Surgical History     Past Surgical History:   Procedure Laterality Date    ABLATION OF DYSRHYTHMIC FOCUS  meningioma    COLONOSCOPY  2008    ECHOCARDIOGRAM, TRANSTHORACIC  04/29/2005    63    ECHOCARDIOGRAM, TRANSTHORACIC  10/17/2016    63    EGD, COLONOSCOPY N/A 04/16/2019    Procedure: EGD, COLONOSCOPY;  Surgeon: Leatha Gilding, MD;  Location: [facility];  Service: Gastroenterology;  Laterality: N/A;  egd, colonoscopy  q1-unk, md req 45 min    EXCISION, LESION  12/03/2008    benign lesion    ORCHIOPEXY Left 1980    for intermittent torsion    Subtotal resection of posterior sagittal meningioma  12/05/2002    Partial brain resection secondary to meningioma at [facility]    TONSILLECTOMY AND ADENOIDECTOMY  as a child    TOOTH EXTRACTION Right 06/2018       Family History     Family History   Problem Relation Age of Onset    Tuberculosis Mother     Dementia Mother     Hearing loss Mother     Migraines Sister     Asthma Daughter         in [place]    No known problems Daughter         in [place]       Social History      Social History     Socioeconomic History    Marital status: Married     Spouse name: Not on file    Number of children: Not on file    Years of education: Not on file    Highest education level: Not on file   Occupational History    Not on file   Tobacco Use    Smoking status: Never    Smokeless tobacco: Never   Vaping Use    Vaping Use: Never used   Substance and Sexual Activity    Alcohol use: Not Currently     Alcohol/week: 1.0 standard drink     Types: 1 Drinks containing 0.5 oz of alcohol per week     Comment: rarely    Drug use: Not Currently     Types: Marijuana     Comment: in edible form    Sexual activity: Not Currently     Partners: Female     Birth control/protection: None   Other Topics Concern    Not on file   Social History Narrative    He lives in [place] and is from [place]. Married - wife Angelique Blonder. Two daughters (one lives in [place] and one in [place]). Retired Web designer - retired in 2016.     Social Determinants of Health     Financial Resource Strain: Not on file   Food Insecurity: Not on file   Transportation Needs: Not on file   Physical Activity: Not on file   Stress: Not on file   Social Connections: Not on file   Intimate Partner Violence: Not on file   Housing Stability: Not on file       Allergies     Allergies   Allergen Reactions    Bextra [Valdecoxib] Rash       Home Medications     Prior to Admission medications    Medication Sig Start Date End Date Taking? Authorizing Provider   albuterol  sulfate HFA (ProAir HFA) 108 (90 Base) MCG/ACT inhaler INHALE ONE PUFF BY MOUTH EVERY FOUR TO SIX HOURS AS NEEDED FOR WHEEZING 04/22/20   Broderick, Pearletha Furl, MD   AMITRIPTYLINE HCL PO Take 40 mg by mouth nightly    01/23/19   [provider]   amLODIPine (NORVASC) 5 MG tablet Take 1 tablet (5 mg total) by mouth 2 (two) times daily 11/27/20   Alvira Monday, MD   clonazePAM (KlonoPIN) 0.5 MG tablet Take 1 tablet (0.5 mg total) by mouth daily as needed for Anxiety 05/12/20   Modesto Charon, MD   diphenhydrAMINE-lidocaine viscous-nystatin-alum & mag hydroxide-simethicone (MAGIC MOUTHWASH) suspension Take by mouth    [provider]   ezetimibe (ZETIA) 10 MG tablet Take 1 tablet (10 mg total) by mouth daily 10/21/20   Modesto Charon, MD   finasteride (PROSCAR) 5 MG tablet Take 1 tablet (5 mg total) by mouth nightly 02/15/21   Modesto Charon, MD   hydrocortisone (CORTEF) 5 MG tablet Take 15 mg upon waking and 5mg  in the evening 07/06/21   Deloria Lair, MD   ibuprofen (ADVIL) 200 MG tablet Take 3 tablets (600 mg) by mouth every 6 (six) hours as needed for Pain    [provider]   levothyroxine (SYNTHROID) 150 MCG tablet Take 1 tablet (150 mcg total) by mouth Once a day at 6:00am 05/24/21   Deloria Lair, MD   metoprolol succinate XL (TOPROL-XL) 50 MG 24 hr tablet Take 1 tablet (50 mg total) by mouth daily 03/09/21   Loura Back B, NP   modafinil (PROVIGIL) 200 MG tablet Take 1 tablet (200 mg) by mouth daily    [provider]   montelukast (SINGULAIR) 10 MG tablet TAKE 1 TABLET BY MOUTH EVERY NIGHT AT BEDTIME AS DIRECTED 07/19/21   Modesto Charon, MD   Multiple Vitamin (multivitamin) capsule Take 1 capsule by mouth daily    [provider]   oxyCODONE-acetaminophen (PERCOCET) 5-325 MG per tablet Take 1 tablet by mouth 2 (two) times daily 02/24/21   [provider]   pantoprazole (PROTONIX) 40 MG tablet Take 1 tablet (40 mg) by mouth daily    [provider]   polyethylene glycol (MIRALAX) 17 g packet Take 17 g by mouth daily    [provider]   Probiotic Product (ALIGN PO) Take 1 tablet by mouth daily    [provider]   prochlorperazine (COMPAZINE) 10 MG tablet Take 1 tablet (10 mg) by mouth daily as needed    [provider]   promethazine (PHENERGAN) 25 MG suppository Place 1 suppository (25 mg) rectally 2 (two) times daily 12/12/20   [provider]   psyllium (METAMUCIL) 58.6 % packet Take 1  packet by mouth daily    [provider]   ramipril (ALTACE) 10 MG capsule Take 2 capsules (20 mg total) by mouth nightly 03/09/21   Loura Back B, NP   simvastatin (ZOCOR) 40 MG tablet Take 1 tablet (40 mg) by mouth nightly 07/05/21   Modesto Charon, MD   SYMBICORT 80-4.5 MCG/ACT inhaler Inhale 2 puffs into the lungs two times daily  Patient taking differently: 2 puffs 2 (two) times daily Have patient rinse mouth after use 11/10/18   Modesto Charon, MD   tamsulosin (FLOMAX) 0.4 MG Cap Take 1 capsule (0.4 mg total) by mouth Daily after dinner 07/05/21   Modesto Charon, MD   Testosterone 20.25 MG/ACT (1.62%) Gel  Apply 4 actuations daily (2 on each shoulder) 01/06/21   Broderick, Pearletha Furl, MD   UNABLE TO FIND Nasal spray (mometosone, ipratromium, diphenhydramine) 2 spritzes BID    [provider]   vitamin D (CHOLECALCIFEROL) 25 MCG (1000 UT) tablet Take 2 tablets (2,000 Units) by mouth daily    [provider]   ZOLPIDEM TARTRATE ER PO Take 10 mg by mouth 1-2 days per week - Patient is taking two 5mg  tablets 06/30/20   [provider]         Review of Systems     Review of Systems   Constitutional: Negative.  Negative for activity change, appetite change, fatigue and unexpected weight change.   HENT: Negative.  Negative for congestion, drooling, facial swelling, sinus pressure, sinus pain and trouble swallowing.    Eyes: Negative.    Respiratory:  Positive for shortness of breath. Negative for apnea, cough and chest tightness.    Cardiovascular: Negative.  Negative for chest pain and leg swelling.   Gastrointestinal: Negative.  Negative for abdominal distention, abdominal pain, diarrhea, nausea and rectal pain.   Genitourinary: Negative.  Negative for difficulty urinating, dysuria, flank pain, frequency, penile swelling, scrotal swelling and testicular pain.   Musculoskeletal: Negative.  Negative for arthralgias, back pain, joint swelling, myalgias and neck  pain.   Skin: Negative.  Negative for pallor, rash and wound.   Neurological:  Negative for dizziness, light-headedness and numbness.   Hematological: Negative.  Negative for adenopathy.   Psychiatric/Behavioral: Negative.  Negative for agitation, behavioral problems, confusion, self-injury and suicidal ideas. The patient is not nervous/anxious and is not hyperactive.    All other systems reviewed and are negative.     Physical Exam     No data found.      Nursing note and vitals reviewed.  Constitutional:  Well developed, well nourished. Awake & Oriented x3.  Head:  Atraumatic. Normocephalic.    Cardiovascular:  Regular rate. Regular rhythm. No murmurs, rubs, or gallops.  Pulmonary/Chest:  No evidence of respiratory distress. Clear to auscultation bilaterally.  No wheezing, rales or rhonchi.   Abdominal:  Soft and non-distended. There is no tenderness. No rebound, guarding, or rigidity.  Back:  Full ROM. Nontender.  Extremities:  No edema. No cyanosis. No clubbing. Full range of motion in all extremities.  Skin:  Skin is warm and dry.  No diaphoresis. No rash.   Neurological:  Alert, awake, and appropriate. Normal speech. Motor normal.  Psychiatric:  Good eye contact. Normal interaction, affect, and behavior.      Orders Placed During This Encounter     Orders Placed This Encounter   Procedures    COVID-19 (SARS-CoV-2) and Influenza A/B, NAA ([facility] Rapid)- Age 78 and above    Chest AP Portable    Basic Metabolic Panel    CBC and differential    Troponin I    D-Dimer    GFR    B-type Natriuretic Peptide    Cardiac monitoring (ED ONLY)    ED Unit Sec Comm Order    ECG 12 lead    Saline lock IV       ED Medications Administered     ED Medication Orders (From admission, onward)      None            Diagnostic Study Results and Data Review     The results of the diagnostic studies below were reviewed by the ED provider:    Labs  Results  Procedure Component Value Units Date/Time    B-type Natriuretic Peptide  [[id number]] Collected: 07/23/21 1552     Updated: 07/23/21 1809     B-Natriuretic Peptide 23 pg/mL     COVID-19 (SARS-CoV-2) and Influenza A/B, NAA (Liat Rapid)- Age 90 and above [[id number]] Collected: 07/23/21 1624    Specimen: Culturette from Nasopharyngeal Updated: 07/23/21 1705     Purpose of COVID testing Diagnostic -[facility]     SARS-CoV-2 Specimen Source Nasal Swab     SARS CoV 2 Overall Result Not Detected     Influenza A Not Detected     Influenza B Not Detected    Narrative:      o Collect and clearly label specimen type:  o PREFERRED-Upper respiratory specimen: One Nasal Swab in  Transport Media.  o Hand deliver to laboratory ASAP  Diagnostic -[facility]    Troponin I [[id number]] Collected: 07/23/21 1552    Specimen: Blood Updated: 07/23/21 1624     Troponin I <0.01 ng/mL     GFR [[id number]] Collected: 07/23/21 1552     Updated: 07/23/21 1615     EGFR 59.5       Basic Metabolic Panel [[id number]]  (Abnormal) Collected: 07/23/21 1552    Specimen: Blood Updated: 07/23/21 1615     Glucose 124 mg/dL      BUN 24.4 mg/dL      Creatinine 1.2 mg/dL      Calcium 9.5 mg/dL      Sodium 010 mEq/L      Potassium 4.2 mEq/L      Chloride 104 mEq/L      CO2 27 mEq/L      Anion Gap 11.0    D-Dimer [[id number]] Collected: 07/23/21 1552     Updated: 07/23/21 1613     D-Dimer <0.22 ug/mL FEU     CBC and differential [[id number]]  (Abnormal) Collected: 07/23/21 1552    Specimen: Blood Updated: 07/23/21 1602     WBC 11.17 x10 3/uL      Hgb 15.0 g/dL      Hematocrit 63.8 %      Platelets 240 x10 3/uL      RBC 4.98 x10 6/uL      MCV 90.8 fL      MCH 30.1 pg      MCHC 33.2 g/dL      RDW 13 %      MPV 10.6 fL      Neutrophils 60.7 %      Lymphocytes Automated 28.1 %      Monocytes 7.9 %      Eosinophils Automated 1.6 %      Basophils Automated 0.8 %      Immature Granulocytes 0.9 %      Nucleated RBC 0.0 /100 WBC      Neutrophils Absolute 6.78 x10 3/uL      Lymphocytes Absolute Automated 3.14 x10 3/uL      Monocytes Absolute Automated 0.88 x10  3/uL      Eosinophils Absolute Automated 0.18 x10 3/uL      Basophils Absolute Automated 0.09 x10 3/uL      Immature Granulocytes Absolute 0.10 x10 3/uL      Absolute NRBC 0.00 x10 3/uL             Radiologic Studies  Radiology Results (24 Hour)       Procedure Component Value Units Date/Time    Chest AP Portable [[id number]] Collected: 07/23/21 1717    Order Status: Completed Updated: 07/23/21 1721  Narrative:      HISTORY: Shortness of breath     COMPARISON: 11/19/2020.    FINDINGS:   The cardiac and mediastinal silhouettes are within normal limits. There  is no evidence of congestive heart failure, focal pneumonia, pleural  effusion or discernible pneumothorax. Degenerative changes are seen  within the spine.      Impression:        There is no evidence of acute disease. Interval decrease in elevation of  the left hemidiaphragm since the prior study.    Stephannie Peters, MD   07/23/2021 5:19 PM              Abnormal results/incidental findings discussed with pt and/or family: yes    Monitors, EKG, Procedures, Critical Care, and Splints     Cardiac Monitor Interpretation:    EKG: NSR @79  bpm no ST elevation/depresison, no T wave inversion, normal R wave progression, no ectopy. This ECG also reviewed by Dr  Littie Deeds        MDM and Clinical Notes     Working Differential (not completely inclusive): ACS,AAA, PE, PNA, pneumothorax, esophageal varices, GERD costochondritis,pleurosy  , etc    Nursing records reviewed and agree: YES     Clinical Notes:       70 year old male with extensive cardiac history ascending aortic aneurism, aortic regurgitation,meningioma , mitral valve insufficiency, asthma, CKD 3 , adrenal insufficiency, GERD IBS fibromyalgia, eczema.    Recent  echocardiogram in April 2022  Conclusions: 1. Normal LV function.                  2. Mild aortic valve regurgitation.                  3. The proximal ascending aorta is moderately dilated at 4.3               cm.                  4. 09/19/2018 echo  showed 4 cm ascending aorta size and LVH.               Wall thicknesses are normal on the current study, likely due               to interstudy technical variability. Chest CT showed aorta               size to be 4.2 cm 11/24/2020.      EKG normal sinus rhythm  Troponin negative  D-dimer negative  BN P-  CBC BMP unremarkable  Chest x-Danny with no cardiomegaly lung fields are clear.      Re-Eval: Re-eval at 1813 patient resting quietly remains to be asymptomatic while observed in emergency department with a past 3 hours.  Patient adamant denies chest pain palpitations difficulty breathing or shortness of breath.  Sats 95%.      Phone Conversation: 1815 spoke with Dr. Gustavo Lah Heart, discussed patient's case in detail including lab work and imaging results.  Given the patient is asymptomatic with reassuring troponin, negative D-dimer as well as negative BNP normal chest x-Loyd patient is okay to follow-up as an outpatient in office with Dr. Gayleen Orem first thing on Monday return to emergency department immediately for any worsening.    The patient is aware that this evaluation is only a screening for emergent conditions related to his or her symptoms and presentation. Discussed appropriate supportive care in addition to any prescriptions provided. Patient/Family were given opportunity  to ask questions which were answered to the best of my ability. Patient/family were given strict return precautions and were agreeable to dispo plan.       Prescriptions       Discharge Medication List as of 07/23/2021  6:34 PM          Diagnosis and Disposition     Clinical Impression:  1. Dyspnea on exertion      Final diagnoses:   Dyspnea on exertion       Disposition:  ED Disposition       ED Disposition   Discharge    Condition   --    Date/Time   Fri Jul 23, 2021  6:10 PM    Comment   Erol Enid Derry, MD discharge to home/self care.    Condition at disposition: Stable                         Rendering Provider: Jimmy Picket  FNP-BC     Attending's signature signifies review of the provider note and clinical impression.      This note was generated by the [facility] EMR system/Dragon speech recognition and may contain inherent errors or omissions not intended by the user. Grammatical errors, random word insertions, deletions, pronoun errors and incomplete sentences are occasional consequences of this technology due to software limitations. Not all errors are caught or corrected. If there are questions or concerns about the content of this note or information contained within the body of this dictation they should be addressed directly with the author for clarification.           Cira Servant, [place]  07/25/21 [phone]

## 2021-07-23 NOTE — ED Notes (Signed)
Bed: A09  Expected date:   Expected time:   Means of arrival:   Comments:  MEDIC 421

## 2021-07-23 NOTE — Telephone Encounter (Signed)
I called the patient about his complaint of dyspnea on exertion.  He is not having any resting dyspnea nor any other symptoms that sound like unstable angina certainly.  I told him he needs a broad work-up for his dyspnea including blood work and probably a chest x-Patryk and he likely will go to the ER for that so it can all be done at once.  Following that we could consider a cardiac ischemic work-up.  His last echo was in April of this year showing good LV function.  He did not have any coronary calcifications on a CT scan of the chest done in April either and he does see Dr. Romilda Garret for his lungs.  He could also follow-up there for his dyspnea certainly.  He will start with the ER visit.

## 2021-07-23 NOTE — ED Provider Notes (Addendum)
I have briefly evaluated this patient as triage physician in order to facilitate and initiate the ordering of laboratory and imaging studies as needed.     Chief Complaint   Patient presents with    Shortness of Breath     Per Dr. Neldon Mc: "I called the patient about his complaint of dyspnea on exertion.  He is not having any resting dyspnea nor any other symptoms that sound like unstable angina certainly.  I told him he needs a broad work-up for his dyspnea including blood work and probably a chest x-Curren and he likely will go to the ER for that so it can all be done at once.  Following that we could consider a cardiac ischemic work-up. "      Brief HPI: 70 YO M WITH H/O hypothyroidism, fibromyalgia, renal stone, BPH, DJD, HTN, HLD, asthma, chronic fatigue syndrome, secondary adrenal insufficiency, CKD 3, meningioma of the brain,   Thoracic aneurysm hypertension sentr by dr Tami Ribas today witrh doe x 1 month.  Shortness of breath was worse and his cardiologist comes in the emergency department.  He does see Dr. Romilda Garret as a pulmonologist for his asthma.  No lower extremity changes.  No history of DVT or PE.  No current wheezing.  No infectious symptoms.  He notes history of orthostatic hypotension in the past.      Vitals:    07/23/21 1505   BP: 160/89   Pulse: 89   Resp: 18   Temp: 98.2 F (36.8 C)   SpO2: 97%       Orders Placed This Encounter    Chest AP Portable    Basic Metabolic Panel    CBC and differential    Troponin I    D-Dimer    GFR    Cardiac monitoring (ED ONLY)    ECG 12 lead    Saline lock IV    EKG (interpreted by ED physician):SR<100 LAD no st elevation nl intervals      Lucille Passy, MD     3:20 PM        Lucille Passy, MD  07/23/21 1520       Aella Ronda, Antony Contras, MD  07/23/21 1525

## 2021-07-26 ENCOUNTER — Encounter: Payer: Self-pay | Admitting: Internal Medicine

## 2021-07-26 ENCOUNTER — Other Ambulatory Visit (INDEPENDENT_AMBULATORY_CARE_PROVIDER_SITE_OTHER): Payer: Self-pay | Admitting: Cardiology

## 2021-07-26 ENCOUNTER — Encounter (INDEPENDENT_AMBULATORY_CARE_PROVIDER_SITE_OTHER): Payer: Self-pay | Admitting: Cardiology

## 2021-07-26 DIAGNOSIS — R0602 Shortness of breath: Secondary | ICD-10-CM

## 2021-07-26 NOTE — Progress Notes (Signed)
Dr. [name] called to schedule Hospital follow up for visit to ED as advised by Dr. Tami Ribas on Friday Dec 2.  He states tests were negative for ACS; however, he still as DOE.  Asked if he will also follow up with Dr. Romilda Garret, pulmonology.  He planned to call her office next.    I booked him for a follow up on Dec 13 with Imagene Gurney, PA-C and wait listed him for an earlier date. Informed him I would update Dr. Tami Ribas.  He asked if I would find out if he should have an Echo done prior to the OV on the 13th.  I will call back with Dr. Tami Ribas recommendation regarding Echo.

## 2021-07-30 ENCOUNTER — Ambulatory Visit (INDEPENDENT_AMBULATORY_CARE_PROVIDER_SITE_OTHER): Payer: Medicare Other

## 2021-07-30 DIAGNOSIS — R0602 Shortness of breath: Secondary | ICD-10-CM

## 2021-08-02 NOTE — Progress Notes (Unsigned)
[place] [facility] CARDIOLOGY OFFICE PROGRESS NOTE    [facility]RT [facility] HEART [facility] OFFICE -CARDIOLOGY  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  Medical Record #: [id number]  Requesting Physician: Modesto Charon, MD      CHIEF COMPLAINT: Shortness of Breath and Hypertension      HISTORY OF PRESENT ILLNESS:    He is a pleasant 70 y.o. male who presents today for hospital follow-up.  Patient was seen earlier this month in the emergency room and discharged from there.  He was there for intermittently worsening dyspnea on exertion over the last 2 months.  He ruled out for COVID-19, his D-dimer negative, troponin negative and BNP 23-- extremely low.    His last echo was in April of this year showed good LV function and it was recommended he repeat the echo currently.  He did not have any coronary calcifications on a CT scan of the chest done in April as well. CXR in the ER showed no acute process and actually a decrease in his left hemidiaphram. He follows with Dr. Romilda Garret for pulmonology.  Patient reports that he has not been exercising regularly due to chronic pain and fibromyalgia but that he can sleep flat in his bed.  There has not been any worsening leg edema or weight gain.    PMHx of ascending aortic aneurism, aortic regurgitation,meningioma, mitral valve insufficiency, asthma, CKD 3 , adrenal insufficiency, GERD IBS fibromyalgia, and about to go for radiation treatment to his brain at [facility].      Echo that was just performed 07/30/2021 shows normal systolic LV function with an EF of 61%, RV is not dilated and RV has normal function and there are no valvular abnormalities, borderline dilated ascending aorta at 4.1 cm and normal aortic root.  As compared to the prior echo there has not been significant interval  change.    Patient is still significantly short of breath and we obtained a pulse ox here in the office showing 98% on room air.  He also notes that his abdomen appears to be distended and bloated.  He saw Dr. Romilda Garret last week who ordered a CAT scan of the chest and abdomen with and without contrast.  He is about to get that done today.    Denies any worsening lower extremity edema, palpitations, chest pain or dizziness.  He said his orthostatis symptoms have resolved.      PAST MEDICAL HISTORY: He has a past medical history of Aortic regurgitation, Ascending aortic aneurysm (09/19/2018), Asthma, Avascular necrosis (2008), Bilateral cataracts, Brain tumor (2003), Calculus of kidney, Cold intolerance, Color blindness, Disorder of prostate, DJD (degenerative joint disease), Dry eyes, Dysautonomia, Eczema (03/29/2016), Encounter for blood transfusion (2004), Encounter for hepatitis C screening test for low risk patient (08/2016), Encounter for pharmacogenetic testing (10/2015), Fibromyalgia, Gastroesophageal reflux disease, Hemorrhoids, IBS (irritable bowel syndrome), Lateral epicondylitis, Malignant neoplasm of skin, Mitral valve insufficiency, Osteoporosis, Peripheral neuropathy (12/26/2019), Screening PSA (prostate specific antigen) (03/03/2021), Sleep apnea, Tinnitus, and Trigger finger of right hand, unspecified finger (04/13/2021). He has a past surgical history that includes EXCISION, LESION (12/03/2008); Colonoscopy (2008); Tonsillectomy and adenoidectomy (as a child); Orchiopexy (Left, 1980); Subtotal resection of posterior sagittal meningioma (12/05/2002); Tooth extraction (Right, 06/2018); EGD, COLONOSCOPY (N/A, 04/16/2019);  ECHOCARDIOGRAM, TRANSTHORACIC (04/29/2005); ECHOCARDIOGRAM, TRANSTHORACIC (10/17/2016); and Ablation of dysrhythmic focus (meningioma).    ALLERGIES:   Allergies   Allergen Reactions    Bextra [Valdecoxib] Rash       MEDICATIONS:   Current Outpatient Medications:     albuterol sulfate HFA  (ProAir HFA) 108 (90 Base) MCG/ACT inhaler, INHALE ONE PUFF BY MOUTH EVERY FOUR TO SIX HOURS AS NEEDED FOR WHEEZING, Disp: 25.5 g, Rfl: 1    AMITRIPTYLINE HCL PO, Take 40 mg by mouth nightly  , Disp: , Rfl:     amLODIPine (NORVASC) 5 MG tablet, Take 1 tablet (5 mg total) by mouth 2 (two) times daily, Disp: 180 tablet, Rfl: 3    clonazePAM (KlonoPIN) 0.5 MG tablet, Take 1 tablet (0.5 mg total) by mouth daily as needed for Anxiety, Disp: 30 tablet, Rfl: 0    diphenhydrAMINE-lidocaine viscous-nystatin-alum & mag hydroxide-simethicone (MAGIC MOUTHWASH) suspension, Take by mouth, Disp: , Rfl:     ezetimibe (ZETIA) 10 MG tablet, Take 1 tablet (10 mg total) by mouth daily, Disp: 90 tablet, Rfl: 3    finasteride (PROSCAR) 5 MG tablet, Take 1 tablet (5 mg total) by mouth nightly, Disp: 90 tablet, Rfl: 3    hydrocortisone (CORTEF) 5 MG tablet, Take 15 mg upon waking and 5mg  in the evening, Disp: 360 tablet, Rfl: 2    ibuprofen (ADVIL) 200 MG tablet, Take 3 tablets (600 mg) by mouth every 6 (six) hours as needed for Pain, Disp: , Rfl:     levothyroxine (SYNTHROID) 150 MCG tablet, Take 1 tablet (150 mcg total) by mouth Once a day at 6:00am, Disp: 90 tablet, Rfl: 0    modafinil (PROVIGIL) 200 MG tablet, Take 1 tablet (200 mg) by mouth daily, Disp: , Rfl:     montelukast (SINGULAIR) 10 MG tablet, TAKE 1 TABLET BY MOUTH EVERY NIGHT AT BEDTIME AS DIRECTED, Disp: 90 tablet, Rfl: 3    Multiple Vitamin (multivitamin) capsule, Take 1 capsule by mouth daily, Disp: , Rfl:     oxyCODONE-acetaminophen (PERCOCET) 5-325 MG per tablet, Take 1 tablet by mouth 2 (two) times daily, Disp: , Rfl:     pantoprazole (PROTONIX) 40 MG tablet, Take 1 tablet (40 mg) by mouth daily, Disp: , Rfl:     polyethylene glycol (MIRALAX) 17 g packet, Take 17 g by mouth daily, Disp: , Rfl:     Probiotic Product (ALIGN PO), Take 1 tablet by mouth daily, Disp: , Rfl:     prochlorperazine (COMPAZINE) 10 MG tablet, Take 1 tablet (10 mg) by mouth daily as needed, Disp:  , Rfl:     promethazine (PHENERGAN) 25 MG suppository, Place 1 suppository (25 mg) rectally 2 (two) times daily, Disp: , Rfl:     psyllium (METAMUCIL) 58.6 % packet, Take 1 packet by mouth daily, Disp: , Rfl:     ramipril (ALTACE) 10 MG capsule, Take 2 capsules (20 mg total) by mouth nightly, Disp: 180 capsule, Rfl: 3    simvastatin (ZOCOR) 40 MG tablet, Take 1 tablet (40 mg) by mouth nightly, Disp: 90 tablet, Rfl: 3    SYMBICORT 80-4.5 MCG/ACT inhaler, Inhale 2 puffs into the lungs two times daily (Patient taking differently: 2 puffs 2 (two) times daily Have patient rinse mouth after use), Disp: 10.2 g, Rfl: 3    tamsulosin (FLOMAX) 0.4 MG Cap, Take 1 capsule (0.4 mg total) by mouth Daily after dinner, Disp: 90 capsule, Rfl: 3    Testosterone 20.25 MG/ACT (1.62%) Gel, Apply 4 actuations daily (2  on each shoulder), Disp: 300 g, Rfl: 1    UNABLE TO FIND, Nasal spray (mometosone, ipratromium, diphenhydramine) 2 spritzes BID, Disp: , Rfl:     vitamin D (CHOLECALCIFEROL) 25 MCG (1000 UT) tablet, Take 2 tablets (2,000 Units) by mouth daily, Disp: , Rfl:     ZOLPIDEM TARTRATE ER PO, Take 10 mg by mouth 1-2 days per week - Patient is taking two 5mg  tablets, Disp: , Rfl:     metoprolol succinate XL (TOPROL-XL) 50 MG 24 hr tablet, Take 1.5 tablets (75 mg) by mouth daily, Disp: 90 tablet, Rfl: 3     FAMILY HISTORY: family history includes Asthma in his daughter; Dementia in his mother; Hearing loss in his mother; Migraines in his sister; No known problems in his daughter; Tuberculosis in his mother.    SOCIAL HISTORY: He reports that he has never smoked. He has never used smokeless tobacco. He reports that he does not currently use alcohol after a past usage of about 1.0 standard drink per week. He reports that he does not currently use drugs after having used the following drugs: Marijuana.    PHYSICAL EXAMINATION    Visit Vitals  BP 160/88 (BP Site: Left arm, Patient Position: Sitting, Cuff Size: Small)   Pulse 87   Ht  1.753 m (5\' 9" )   Wt 88 kg (194 lb)   BMI 28.65 kg/m     Weight Monitoring 11/27/2020 02/09/2021 03/09/2021 06/21/2021 07/05/2021 07/23/2021 08/03/2021   Height 175.3 cm 175.3 cm 175.3 cm 175.3 cm 176.5 cm - 175.3 cm   Height Method - - - - - - -   Weight 80.74 kg 81.647 kg 83.008 kg 84.823 kg 86.546 kg 88 kg 87.998 kg   Weight Method - - - - - Actual -   BMI (calculated) 26.3 kg/m2 26.6 kg/m2 27.1 kg/m2 27.7 kg/m2 27.8 kg/m2 - 28.7 kg/m2         General Appearance:  A well-appearing male in no acute distress.    Skin: Warm and dry to touch, no apparent skin lesions, or masses noted.  Head: Normocephalic, atraumatic  Eyes: conjunctivae and lids unremarkable.  ENT: Mask on  Neck: JVP normal  Chest: Clear to auscultation bilaterally with good air movement and respiratory effort and no wheezes, rales, or rhonchi   Cardiovascular: Regular rhythm, S1 normal, S2 normal, No S3 or S4, Apical impulse not displaced. No murmurs. No gallops or rubs detected   Abdomen: Soft, nontender, non-distended  Extremities: Warm without edema. 2+ radial pulses bilaterally  Neuro: Alert and oriented x3. No gross motor or sensory deficits noted, affect appropriate.        ECG: 07/23/2021, normal sinus rhythm no acute ST-T changes    LABS:   No results found for: CBC  Lab Results   Component Value Date    AST 49 (H) 02/04/2021    ALT 75 (H) 02/04/2021     No results found for: LIPID  Lab Results   Component Value Date    HGBA1C 6.2 (H) 03/03/2021    BNP 23 07/23/2021    TSH 0.26 (L) 05/21/2021       IMPRESSION:   Mr. Lubrano is a 70 y.o. male with the following problems:     Recent intermittent waxing and waning of shortness of breath and dyspnea on exertion ER evaluation that was unremarkable and echo 07/30/2021 showing normal LV function, no significant abnormalities overall and aortic root normal with ascending aorta borderline dilated at 4.1 cm  HTN  Orthostasis, resolved   S/p echo 11/2020 with normal LV function, no LVH, ascending aorta  moderately dilated at 4.3cm and was 4.2cm on recent CT scan  No coronary calcifications on CT chest that was performed on April of this year--this was was performed for dyspnea   Dyslipidemia-simvastatin use.  Partial resection of meningioma in 2004.  Panhypopituitarism-on replacement therapy.  Peripheral neuropathy and some cognitive issues.  Fibromyalgia and chronic fatigue syndrome.        RECOMMENDATIONS:     Echo results are in currently and showing normal echo with normal LV function, normal RV function no significant dilation of his aorta  Patient's ER evaluation was reviewed with low BNP, negative troponin, normal EKG, negative D-dimer and clear on chest x-Marian, unremarkable CBC--not anemic, unremarkable BMP-- with normal renal function  Patient absolutely appears euvolemic on exam  As recommended by Dr. Trinna Balloon we can proceed with ischemic evaluation for completeness--- will order and Lexiscan  Continue work-up with Dr. Romilda Garret.  Patient is to complete a CT chest, abdomen and pelvis with and without contrast today  Increase metoprolol XL to 75 mg a day for better blood pressure control  Continue to track blood pressures daily  Follow-up with [place] in 3 months or earlier as needed                                                        Orders Placed This Encounter   Procedures    Lexiscan Nuclear Stress Test    Office Visit ([facility] )       Orders Placed This Encounter   Medications    metoprolol succinate XL (TOPROL-XL) 50 MG 24 hr tablet     Sig: Take 1.5 tablets (75 mg) by mouth daily     Dispense:  90 tablet     Refill:  3       SIGNED:    Dreama Saa, PA          This note was generated by the Dragon speech recognition and may contain errors or omissions not intended by the user. Grammatical errors, random word insertions, deletions, pronoun errors, and incomplete sentences are occasional consequences of this technology due to software limitations. Not all errors are caught or corrected. If there are  questions or concerns about the content of this note or information contained within the body of this dictation, they should be addressed directly with the author for clarification.

## 2021-08-03 ENCOUNTER — Encounter: Payer: Self-pay | Admitting: Internal Medicine

## 2021-08-03 ENCOUNTER — Other Ambulatory Visit: Payer: Self-pay | Admitting: Critical Care Medicine

## 2021-08-03 ENCOUNTER — Encounter (INDEPENDENT_AMBULATORY_CARE_PROVIDER_SITE_OTHER): Payer: Self-pay | Admitting: Cardiovascular Disease

## 2021-08-03 ENCOUNTER — Encounter (INDEPENDENT_AMBULATORY_CARE_PROVIDER_SITE_OTHER): Payer: Self-pay | Admitting: Physician Assistant

## 2021-08-03 ENCOUNTER — Ambulatory Visit (INDEPENDENT_AMBULATORY_CARE_PROVIDER_SITE_OTHER): Payer: Medicare Other | Admitting: Physician Assistant

## 2021-08-03 VITALS — BP 160/88 | HR 87 | Ht 69.0 in | Wt 194.0 lb

## 2021-08-03 DIAGNOSIS — E782 Mixed hyperlipidemia: Secondary | ICD-10-CM

## 2021-08-03 DIAGNOSIS — I10 Essential (primary) hypertension: Secondary | ICD-10-CM

## 2021-08-03 DIAGNOSIS — R0609 Other forms of dyspnea: Secondary | ICD-10-CM

## 2021-08-03 MED ORDER — METOPROLOL SUCCINATE ER 50 MG PO TB24
75.0000 mg | ORAL_TABLET | Freq: Every day | ORAL | 3 refills | Status: DC
Start: 2021-08-03 — End: 2021-10-26

## 2021-08-04 ENCOUNTER — Encounter (INDEPENDENT_AMBULATORY_CARE_PROVIDER_SITE_OTHER): Payer: Self-pay | Admitting: Cardiology

## 2021-08-05 ENCOUNTER — Encounter (INDEPENDENT_AMBULATORY_CARE_PROVIDER_SITE_OTHER): Payer: Self-pay | Admitting: Cardiology

## 2021-08-11 ENCOUNTER — Telehealth (INDEPENDENT_AMBULATORY_CARE_PROVIDER_SITE_OTHER): Payer: Self-pay

## 2021-08-11 NOTE — Telephone Encounter (Signed)
Dr. Andria Rhein was transferred to me from [facility] to help reschedule an appt with Dr. Tami Ribas.  He is currently booked for Sep 16, 2020 which he can't make due to radiation therapy.  The next opening is not until May.  Informed him I would message the [facility] at [facility].

## 2021-08-12 ENCOUNTER — Ambulatory Visit (INDEPENDENT_AMBULATORY_CARE_PROVIDER_SITE_OTHER): Payer: Medicare Other | Admitting: Cardiology

## 2021-08-12 ENCOUNTER — Encounter (INDEPENDENT_AMBULATORY_CARE_PROVIDER_SITE_OTHER): Payer: Self-pay | Admitting: Cardiology

## 2021-08-12 VITALS — BP 152/90 | HR 64 | Ht 69.0 in | Wt 190.0 lb

## 2021-08-12 DIAGNOSIS — E782 Mixed hyperlipidemia: Secondary | ICD-10-CM

## 2021-08-12 DIAGNOSIS — R0609 Other forms of dyspnea: Secondary | ICD-10-CM

## 2021-08-12 NOTE — Progress Notes (Signed)
[place] HE[facility] CARDIOLOGY OFFICE PROGRESS NOTE    [facility]RT [facility] HEART [facility] OFFICE -CARDIOLOGY  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  Medical Record #: [id number]  Requesting Physician: Modesto Charon, MD      CHIEF COMPLAINT: Hypertension      HISTORY OF PRESENT ILLNESS:    He is a pleasant 70 y.o. male who presents today for follow-up of dyspnea on exertion which has developed recently.  He notes that when he climbs his driveway but not when he rides his exercise bicycle.  He feels his orthostasis has been better since metoprolol was added and titrated.  His blood pressure has been high, however so we discussed increasing the metoprolol further.  He has not yet had his stress nuclear test but his echo was reassuring.    He has begun radiation  therapy of his meningioma.    And mentions that his pulse oximetry was recently down to 89% when he was just sitting on the couch although it is usually in the 90s.  He was asymptomatic at the time, however.      PAST MEDICAL HISTORY: He has a past medical history of Aortic regurgitation, Ascending aortic aneurysm (09/19/2018), Asthma, Avascular necrosis (2008), Bilateral cataracts, Brain tumor (2003), Calculus of kidney, Cold intolerance, Color blindness, Disorder of prostate, DJD (degenerative joint disease), Dry eyes, Dysautonomia, Eczema (03/29/2016), Encounter for blood transfusion (2004), Encounter for hepatitis C screening test for low risk patient (08/2016), Encounter for pharmacogenetic testing (10/2015), Fibromyalgia, Gastroesophageal reflux disease, Hemorrhoids, IBS (irritable bowel syndrome), Lateral epicondylitis, Malignant neoplasm of skin, Mitral valve insufficiency, Osteoporosis, Peripheral neuropathy (12/26/2019), Screening PSA (prostate specific antigen)  (03/03/2021), Sleep apnea, Tinnitus, and Trigger finger of right hand, unspecified finger (04/13/2021). He has a past surgical history that includes EXCISION, LESION (12/03/2008); Colonoscopy (2008); Tonsillectomy and adenoidectomy (as a child); Orchiopexy (Left, 1980); Subtotal resection of posterior sagittal meningioma (12/05/2002); Tooth extraction (Right, 06/2018); EGD, COLONOSCOPY (N/A, 04/16/2019); ECHOCARDIOGRAM, TRANSTHORACIC (04/29/2005); ECHOCARDIOGRAM, TRANSTHORACIC (10/17/2016); and Ablation of dysrhythmic focus (meningioma).    ALLERGIES:   Allergies   Allergen Reactions    Bextra [Valdecoxib] Rash       MEDICATIONS:   Patient's current medications were reviewed. ONLY Cardiac medications were updated unless others were addressed in assessment and plan.    Current Outpatient Medications:     albuterol sulfate HFA (ProAir HFA) 108 (90 Base) MCG/ACT inhaler, INHALE ONE PUFF BY MOUTH EVERY FOUR TO SIX HOURS AS NEEDED FOR WHEEZING, Disp: 25.5 g, Rfl: 1    AMITRIPTYLINE HCL PO, Take 40 mg by mouth nightly  , Disp: , Rfl:     amLODIPine (NORVASC) 5 MG tablet, Take 1 tablet (5 mg total) by mouth 2 (two) times daily, Disp: 180 tablet, Rfl: 3    clonazePAM (KlonoPIN) 0.5 MG tablet, Take 1 tablet (0.5 mg total) by mouth daily as needed for Anxiety, Disp: 30 tablet, Rfl: 0    diphenhydrAMINE-lidocaine viscous-nystatin-alum & mag hydroxide-simethicone (MAGIC MOUTHWASH) suspension, Take by mouth, Disp: , Rfl:     ezetimibe (ZETIA) 10 MG tablet, Take 1 tablet (10 mg total) by mouth daily, Disp: 90 tablet, Rfl: 3    finasteride (PROSCAR) 5 MG tablet, Take 1 tablet (5 mg total) by mouth nightly, Disp: 90  tablet, Rfl: 3    hydrocortisone (CORTEF) 5 MG tablet, Take 15 mg upon waking and 5mg  in the evening, Disp: 360 tablet, Rfl: 2    ibuprofen (ADVIL) 200 MG tablet, Take 3 tablets (600 mg) by mouth every 6 (six) hours as needed for Pain, Disp: , Rfl:     levothyroxine (SYNTHROID) 150 MCG tablet, Take 1 tablet (150 mcg  total) by mouth Once a day at 6:00am, Disp: 90 tablet, Rfl: 0    metoprolol succinate XL (TOPROL-XL) 50 MG 24 hr tablet, Take 1.5 tablets (75 mg) by mouth daily, Disp: 90 tablet, Rfl: 3    modafinil (PROVIGIL) 200 MG tablet, Take 1 tablet (200 mg) by mouth daily, Disp: , Rfl:     montelukast (SINGULAIR) 10 MG tablet, TAKE 1 TABLET BY MOUTH EVERY NIGHT AT BEDTIME AS DIRECTED, Disp: 90 tablet, Rfl: 3    Multiple Vitamin (multivitamin) capsule, Take 1 capsule by mouth daily, Disp: , Rfl:     oxyCODONE-acetaminophen (PERCOCET) 5-325 MG per tablet, Take 1 tablet by mouth 2 (two) times daily, Disp: , Rfl:     pantoprazole (PROTONIX) 40 MG tablet, Take 1 tablet (40 mg) by mouth daily, Disp: , Rfl:     polyethylene glycol (MIRALAX) 17 g packet, Take 17 g by mouth daily, Disp: , Rfl:     Probiotic Product (ALIGN PO), Take 1 tablet by mouth daily, Disp: , Rfl:     prochlorperazine (COMPAZINE) 10 MG tablet, Take 1 tablet (10 mg) by mouth daily as needed, Disp: , Rfl:     promethazine (PHENERGAN) 25 MG suppository, Place 1 suppository (25 mg) rectally 2 (two) times daily, Disp: , Rfl:     psyllium (METAMUCIL) 58.6 % packet, Take 1 packet by mouth daily, Disp: , Rfl:     ramipril (ALTACE) 10 MG capsule, Take 2 capsules (20 mg total) by mouth nightly, Disp: 180 capsule, Rfl: 3    simvastatin (ZOCOR) 40 MG tablet, Take 1 tablet (40 mg) by mouth nightly, Disp: 90 tablet, Rfl: 3    SYMBICORT 80-4.5 MCG/ACT inhaler, Inhale 2 puffs into the lungs two times daily (Patient taking differently: 2 puffs 2 (two) times daily Have patient rinse mouth after use), Disp: 10.2 g, Rfl: 3    tamsulosin (FLOMAX) 0.4 MG Cap, Take 1 capsule (0.4 mg total) by mouth Daily after dinner, Disp: 90 capsule, Rfl: 3    Testosterone 20.25 MG/ACT (1.62%) Gel, Apply 4 actuations daily (2 on each shoulder), Disp: 300 g, Rfl: 1    UNABLE TO FIND, Nasal spray (mometosone, ipratromium, diphenhydramine) 2 spritzes BID, Disp: , Rfl:     vitamin D (CHOLECALCIFEROL) 25  MCG (1000 UT) tablet, Take 2 tablets (2,000 Units) by mouth daily, Disp: , Rfl:     ZOLPIDEM TARTRATE ER PO, Take 10 mg by mouth 1-2 days per week - Patient is taking two 5mg  tablets, Disp: , Rfl:      FAMILY HISTORY: family history includes Asthma in his daughter; Dementia in his mother; Hearing loss in his mother; Migraines in his sister; No known problems in his daughter; Tuberculosis in his mother.    SOCIAL HISTORY: He reports that he has never smoked. He has never used smokeless tobacco. He reports that he does not currently use alcohol after a past usage of about 1.0 standard drink per week. He reports that he does not currently use drugs after having used the following drugs: Marijuana.    PHYSICAL EXAMINATION    Visit Vitals  BP 152/90 (BP Site: Left arm, Patient Position: Sitting, Cuff Size: Small)   Pulse 64   Ht 1.753 m (5\' 9" )   Wt 86.2 kg (190 lb)   BMI 28.06 kg/m       General Appearance:  A well-appearing male in no acute distress.    Skin: Warm and dry to touch, no apparent skin lesions, or masses noted.  Head: Normocephalic, normal hair pattern, no masses or tenderness   Eyes:  conjunctivae and lids unremarkable.  ENT:   No pallor or cyanosis.    Neck: JVP normal, no carotid bruit,   Chest: Clear to auscultation bilaterally with good air movement and respiratory effort and no wheezes, rales, or rhonchi   Cardiovascular: Regular rhythm, S1 normal, S2 normal, No S3 or S4. Apical impulse not displaced. No murmur. No gallops or rubs detected   Abdomen: Soft, nontender, nondistended, with normoactive bowel sounds. No organomegaly.  No pulsatile masses, or bruits.   Extremities: Warm without edema. No clubbing, or cyanosis. All peripheral pulses are full and equal.   Neuro: Alert and oriented x3. No gross motor or sensory deficits noted, affect appropriate.        ECG: Normal sinus rhythm, unremarkable      LABS:   Lab Results   Component Value Date    WBC 11.17 (H) 07/23/2021    HGB 15.0 07/23/2021     HCT 45.2 07/23/2021    PLT 240 07/23/2021     Lab Results   Component Value Date    GLU 124 (H) 07/23/2021    BUN 21.0 07/23/2021    CREAT 1.2 07/23/2021    NA 142 07/23/2021    K 4.2 07/23/2021    CL 104 07/23/2021    CO2 27 07/23/2021    AST 49 (H) 02/04/2021    ALT 75 (H) 02/04/2021     Lab Results   Component Value Date    MG 1.9 02/12/2019    TSH 0.26 (L) 05/21/2021    HGBA1C 6.2 (H) 03/03/2021    BNP 23 07/23/2021     Lab Results   Component Value Date    CHOL 217 (H) 03/03/2021    TRIG 299 (H) 03/03/2021    HDL 61 03/03/2021    LDL 105 (H) 03/03/2021           Most recent echo and CT chest reviewed.      Impression:    Recent intermittent waxing and waning of shortness of breath and dyspnea on exertion ER evaluation that was unremarkable and echo 07/30/2021 showing normal LV function, no significant abnormalities overall and aortic root normal with ascending aorta borderline dilated at 4.1 cm.  CT angiography showed no pulmonary embolism.  Single slightly abnormal pulse oximeter reading of 89% while sitting on the couch and asymptomatic.  HTN  Orthostasis, resolved   S/p echo 11/2020 with normal LV function, no LVH, ascending aorta moderately dilated at 4.3cm and was 4.2cm on recent CT scan  No coronary calcifications on CT chest that was performed on April of this year--this was was performed for dyspnea   Dyslipidemia-simvastatin use.  Partial resection of meningioma in 2004.  Now undergoing radiation therapy.  Panhypopituitarism-on replacement therapy.  Peripheral neuropathy and some cognitive issues.  Fibromyalgia and chronic fatigue syndrome.    Recommendations:    1.  Increase metoprolol succinate to 75 mg daily.    2.  Consider titration to 100 mg daily if blood pressure remains abnormal and heart rate  allows.    3.  Proceed with stress nuclear testing as planned but he will hold his metoprolol succinate the night before and bring it with him to the test.    4.  APP visit after the nuclear stress test  and pulmonologist evaluation also.  At his request I am happy to see him in 6 months.                                                       Orders Placed This Encounter   Procedures    ECG 12 lead (Normal)         SIGNED:    Harvel Quale, MD          This note was generated by the Dragon speech recognition and may contain errors or omissions not intended by the user. Grammatical errors, random word insertions, deletions, pronoun errors, and incomplete sentences are occasional consequences of this technology due to software limitations. Not all errors are caught or corrected. If there are questions or concerns about the content of this note or information contained within the body of this dictation, they should be addressed directly with the author for clarification.

## 2021-08-13 ENCOUNTER — Other Ambulatory Visit (INDEPENDENT_AMBULATORY_CARE_PROVIDER_SITE_OTHER): Payer: Self-pay | Admitting: Cardiology

## 2021-08-13 DIAGNOSIS — I10 Essential (primary) hypertension: Secondary | ICD-10-CM

## 2021-08-17 MED ORDER — AMLODIPINE BESYLATE 5 MG PO TABS
5.0000 mg | ORAL_TABLET | Freq: Two times a day (BID) | ORAL | 3 refills | Status: DC
Start: 2021-08-17 — End: 2022-01-13

## 2021-08-26 ENCOUNTER — Inpatient Hospital Stay
Admission: RE | Admit: 2021-08-26 | Discharge: 2021-08-26 | Disposition: A | Discharge status: Home / Self Care | Payer: Medicare Other | Source: Ambulatory Visit | Attending: Physician Assistant | Admitting: Physician Assistant

## 2021-08-26 DIAGNOSIS — E782 Mixed hyperlipidemia: Secondary | ICD-10-CM | POA: Insufficient documentation

## 2021-08-26 DIAGNOSIS — R0609 Other forms of dyspnea: Secondary | ICD-10-CM | POA: Insufficient documentation

## 2021-08-26 DIAGNOSIS — I10 Essential (primary) hypertension: Secondary | ICD-10-CM | POA: Insufficient documentation

## 2021-08-26 MED ORDER — REGADENOSON 0.4 MG/5ML IV SOLN
0.4000 mg | Freq: Once | INTRAVENOUS | Status: AC | PRN
Start: 2021-08-26 — End: 2021-08-26
  Administered 2021-08-26: 0.4 mg via INTRAVENOUS
  Filled 2021-08-26: qty 5

## 2021-08-26 MED ORDER — TECHNETIUM TC 99M TETROFOSMIN IV KIT
10.0000 | PACK | Freq: Once | INTRAVENOUS | Status: AC | PRN
Start: 2021-08-26 — End: 2021-08-26
  Administered 2021-08-26: 10 via INTRAVENOUS
  Filled 2021-08-26: qty 100

## 2021-08-26 MED ORDER — TECHNETIUM TC 99M TETROFOSMIN IV KIT
35.0000 | PACK | Freq: Once | INTRAVENOUS | Status: AC | PRN
Start: 2021-08-26 — End: 2021-08-26
  Administered 2021-08-26: 35 via INTRAVENOUS
  Filled 2021-08-26: qty 100

## 2021-08-27 ENCOUNTER — Encounter (INDEPENDENT_AMBULATORY_CARE_PROVIDER_SITE_OTHER): Payer: Self-pay | Admitting: Cardiovascular Disease

## 2021-08-31 ENCOUNTER — Encounter (INDEPENDENT_AMBULATORY_CARE_PROVIDER_SITE_OTHER): Payer: Self-pay | Admitting: Physician Assistant

## 2021-08-31 ENCOUNTER — Ambulatory Visit (INDEPENDENT_AMBULATORY_CARE_PROVIDER_SITE_OTHER): Payer: Medicare Other | Admitting: Physician Assistant

## 2021-08-31 DIAGNOSIS — E782 Mixed hyperlipidemia: Secondary | ICD-10-CM

## 2021-08-31 DIAGNOSIS — I10 Essential (primary) hypertension: Secondary | ICD-10-CM

## 2021-08-31 DIAGNOSIS — R0609 Other forms of dyspnea: Secondary | ICD-10-CM

## 2021-08-31 NOTE — Progress Notes (Unsigned)
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  Medical Record #: [id number]  Requesting Physician: Modesto Charon, MD      CHIEF COMPLAINT: HTN (hypertension), benign      HISTORY OF PRESENT ILLNESS:    Shannon [name] returns for follow-up of his blood pressure and DOE. At a visit with our Dr. Tami Ribas in late Dec 2022, he noted DOE when he climbs his driveway, but not when he rides his airdyne exercise bicycle.  He feels his orthostasis has been better since metoprolol was added and titrated, now up to 175mg  a day as of a few days ago. He cites that his blood pressure has still been high though seems to be improving and was WNL today on multiple checks.     Echocardiogram Jul 30, 2021 was a normal study, save for a dilated ascending aorta 4.1 cm.  Subsequent Lexiscan MPI study last week demonstrated normal myocardial perfusion function.    He has begun proton therapy of recurrent meningioma, which he says worsens his fibromyalgia and chronic fatigue. He reports he will lie in bed for 20hr a day, likely contributing to deconditioning. He also is followed by Dr. Romilda Garret of pulmonology.      PAST MEDICAL HISTORY: He has a past medical history of Aortic regurgitation, Ascending aortic aneurysm (09/19/2018), Asthma, Avascular necrosis (2008), Bilateral cataracts, Brain tumor (2003), Calculus of kidney, Cold intolerance, Color blindness, Disorder of prostate, DJD (degenerative joint disease), Dry eyes, Dysautonomia, Eczema (03/29/2016), Encounter for blood transfusion (2004), Encounter for hepatitis C screening test for low risk patient (08/2016), Encounter for pharmacogenetic testing (10/2015), Fibromyalgia, Gastroesophageal reflux disease,  Hemorrhoids, Hyperlipidemia (1990), Hypertension (1990), IBS (irritable bowel syndrome), Lateral epicondylitis, Malignant neoplasm of skin, Mitral valve insufficiency, Osteoporosis, Peripheral neuropathy (12/26/2019), Screening PSA (prostate specific antigen) (03/03/2021), Sleep apnea, Tinnitus, and Trigger finger of right hand, unspecified finger (04/13/2021). He has a past surgical history that includes EXCISION, LESION (12/03/2008); Colonoscopy (2008); Tonsillectomy and adenoidectomy (as a child); Orchiopexy (Left, 1980); Subtotal resection of posterior sagittal meningioma (12/05/2002); Tooth extraction (Right, 06/2018); EGD, COLONOSCOPY (N/A, 04/16/2019); ECHOCARDIOGRAM, TRANSTHORACIC (04/29/2005); ECHOCARDIOGRAM, TRANSTHORACIC (10/17/2016); and Ablation of dysrhythmic focus (meningioma).    ALLERGIES:   Allergies   Allergen Reactions    Bextra [Valdecoxib] Rash       MEDICATIONS:   Patient's current medications were reviewed. ONLY Cardiac medications were updated unless others were addressed in assessment and plan.    Current Outpatient Medications:     albuterol sulfate HFA (ProAir HFA) 108 (90 Base) MCG/ACT inhaler, INHALE ONE PUFF BY MOUTH EVERY FOUR TO SIX HOURS AS NEEDED FOR WHEEZING, Disp: 25.5 g, Rfl: 1    AMITRIPTYLINE HCL PO, Take 40 mg by mouth nightly  , Disp: , Rfl:     amLODIPine (NORVASC) 5 MG tablet, Take 1 tablet (5 mg) by mouth 2 (two) times daily, Disp: 180 tablet, Rfl: 3    clonazePAM (KlonoPIN) 0.5 MG tablet, Take 1 tablet (0.5 mg total) by mouth daily as needed for Anxiety, Disp: 30 tablet, Rfl: 0    diphenhydrAMINE-lidocaine viscous-nystatin-alum & mag hydroxide-simethicone (MAGIC MOUTHWASH) suspension, Take by mouth, Disp: , Rfl:     ezetimibe (  ZETIA) 10 MG tablet, Take 1 tablet (10 mg total) by mouth daily, Disp: 90 tablet, Rfl: 3    finasteride (PROSCAR) 5 MG tablet, Take 1 tablet (5 mg total) by mouth nightly, Disp: 90 tablet, Rfl: 3    hydrocortisone (CORTEF) 5 MG tablet, Take 15 mg  upon waking and 5mg  in the evening, Disp: 360 tablet, Rfl: 2    ibuprofen (ADVIL) 200 MG tablet, Take 3 tablets (600 mg) by mouth every 6 (six) hours as needed for Pain, Disp: , Rfl:     levothyroxine (SYNTHROID) 150 MCG tablet, Take 1 tablet (150 mcg total) by mouth Once a day at 6:00am, Disp: 90 tablet, Rfl: 0    metoprolol succinate XL (TOPROL-XL) 50 MG 24 hr tablet, Take 1.5 tablets (75 mg) by mouth daily (Patient taking differently: Take 175 mg by mouth daily), Disp: 90 tablet, Rfl: 3    modafinil (PROVIGIL) 200 MG tablet, Take 200 mg by mouth as needed, Disp: , Rfl:     montelukast (SINGULAIR) 10 MG tablet, TAKE 1 TABLET BY MOUTH EVERY NIGHT AT BEDTIME AS DIRECTED, Disp: 90 tablet, Rfl: 3    Multiple Vitamin (multivitamin) capsule, Take 1 capsule by mouth daily, Disp: , Rfl:     oxyCODONE-acetaminophen (PERCOCET) 5-325 MG per tablet, Take 1 tablet by mouth 2 (two) times daily, Disp: , Rfl:     pantoprazole (PROTONIX) 40 MG tablet, Take 1 tablet (40 mg) by mouth daily, Disp: , Rfl:     polyethylene glycol (MIRALAX) 17 g packet, Take 17 g by mouth daily, Disp: , Rfl:     Probiotic Product (ALIGN PO), Take 1 tablet by mouth daily, Disp: , Rfl:     prochlorperazine (COMPAZINE) 10 MG tablet, Take 1 tablet (10 mg) by mouth daily as needed, Disp: , Rfl:     promethazine (PHENERGAN) 25 MG suppository, Place 1 suppository (25 mg) rectally 2 (two) times daily, Disp: , Rfl:     psyllium (METAMUCIL) 58.6 % packet, Take 1 packet by mouth daily, Disp: , Rfl:     ramipril (ALTACE) 10 MG capsule, Take 2 capsules (20 mg total) by mouth nightly, Disp: 180 capsule, Rfl: 3    simvastatin (ZOCOR) 40 MG tablet, Take 1 tablet (40 mg) by mouth nightly, Disp: 90 tablet, Rfl: 3    SYMBICORT 80-4.5 MCG/ACT inhaler, Inhale 2 puffs into the lungs two times daily (Patient taking differently: 2 puffs 2 (two) times daily Have patient rinse mouth after use), Disp: 10.2 g, Rfl: 3    tamsulosin (FLOMAX) 0.4 MG Cap, Take 1 capsule (0.4 mg total)  by mouth Daily after dinner, Disp: 90 capsule, Rfl: 3    Testosterone 20.25 MG/ACT (1.62%) Gel, Apply 4 actuations daily (2 on each shoulder), Disp: 300 g, Rfl: 1    UNABLE TO FIND, Nasal spray (mometosone, ipratromium, diphenhydramine) 2 spritzes BID, Disp: , Rfl:     vitamin D (CHOLECALCIFEROL) 25 MCG (1000 UT) tablet, Take 2 tablets (2,000 Units) by mouth daily, Disp: , Rfl:     ZOLPIDEM TARTRATE ER PO, Take 10 mg by mouth 1-2 days per week - Patient is taking two 5mg  tablets, Disp: , Rfl:      FAMILY HISTORY: family history includes Asthma in his daughter; Dementia in his mother; Hearing loss in his mother; Migraines in his sister; No known problems in his daughter; Tuberculosis in his mother.    SOCIAL HISTORY: He reports that he has never smoked. He has never used smokeless tobacco. He reports  that he does not currently use alcohol after a past usage of about 1.0 standard drink per week. He reports that he does not use drugs.    PHYSICAL EXAMINATION    Visit Vitals  BP 130/70 (BP Site: Left arm, Patient Position: Sitting, Cuff Size: Large)   Pulse (!) 56   Ht 1.753 m (5\' 9" )   Wt 89.4 kg (197 lb)   BMI 29.09 kg/m       General Appearance:  A an obese-appearing male in no acute distress.    Skin: Warm and dry to touch.  Head: Normocephalic, normal hair pattern, no masses or tenderness   Eyes: EOMS Intact, PERRL, conjunctivae and lids unremarkable.  ENT: Ears, Nose and throat reveal no gross abnormalities.  No pallor or cyanosis.    Neck: JVP normal, no carotid bruit, thyroid not enlarged   Chest: Clear to auscultation bilaterally with good air movement and respiratory effort and no wheezes, rales, or rhonchi   Cardiovascular: Regular rhythm, S1 normal, S2 normal, No S3 or S4. Apical impulse not displaced. No murmur. No gallops or rubs detected   Abdomen: protuberant but soft, nontender, nondistended, with normoactive bowel sounds. No organomegaly.  No pulsatile masses, or bruits.   Extremities: Warm without  edema. No clubbing, or cyanosis. All peripheral pulses are full and equal.   Neuro: Alert and oriented x3. No gross motor or sensory deficits noted, flat, depressive affect.              LABS:   Lab Results   Component Value Date    WBC 11.17 (H) 07/23/2021    HGB 15.0 07/23/2021    HCT 45.2 07/23/2021    PLT 240 07/23/2021     Lab Results   Component Value Date    GLU 124 (H) 07/23/2021    BUN 21.0 07/23/2021    CREAT 1.2 07/23/2021    NA 142 07/23/2021    K 4.2 07/23/2021    CL 104 07/23/2021    CO2 27 07/23/2021    AST 49 (H) 02/04/2021    ALT 75 (H) 02/04/2021     Lab Results   Component Value Date    MG 1.9 02/12/2019    TSH 0.26 (L) 05/21/2021    HGBA1C 6.2 (H) 03/03/2021    BNP 23 07/23/2021     Lab Results   Component Value Date    CHOL 217 (H) 03/03/2021    TRIG 299 (H) 03/03/2021    HDL 61 03/03/2021    LDL 105 (H) 03/03/2021           Most recent echo and nuclear study reviewed.      IMPRESSION:    Recent intermittent waxing and waning of shortness of breath and dyspnea on exertion.   ER evaluation was unremarkable. CTA showed no PE.   Echo and Lexi MPI in Dec 2022 were both reassuringly normal:  normal LV function, normal perfusion, and no significant abnormalities overall, save for borderline dilated ascending aorta.  Single slightly abnormal pulse oximeter reading of 89% while sitting on the couch and asymptomatic.  HTN - improving on increased Toprol, currently at 175mg  qPM.  Orthostasis, resolved with Toprol.  Asc Ao mildly dilated at 4.3 cm on echo 11/2020.  4.2cm by CTA 07/2021. 4.1 cm by echo 07/2021.    No coronary calcifications on chest CT's, and normal Lexi MPI 07/2021.   Dyslipidemia - simvastatin use.  Partial resection of meningioma in 2004.  Now undergoing proton radiation  therapy.  Panhypopituitarism - on replacement therapy.  Peripheral neuropathy and some cognitive issues.  Fibromyalgia and chronic fatigue syndrome. Reports he may lie in bed up to 20hr a day.    PLAN:    1.   Continue  current therapy.  Continue to monitor routine resting blood pressure, perhaps waiting at least a couple of weeks before uptitrating to 200 mg of Toprol as was planned.  His blood pressure appears to be normalizing, and his orthostasis symptoms have resolved.    2.   It is puzzling that he has no dyspnea when riding his airdyne exercise bike for 30 minutes a day, but feels short of breath with ambulation.  Panhypopituitarism & chronic fatigue & lying in bed 20 hours a day as reported certainly may be contributing to deconditioning, and he is encouraged to remain as mentally and physically engaged as possible.  Encouraged to continue riding his exercise bike regularly.    3.   Follow-up with Dr. Romilda Garret of pulmonology as planned.  4.   Barring any change in cardiac symptoms, follow-up with Dr. Tami Ribas in 76mo as planned- or sooner if needed.                                                     No orders of the defined types were placed in this encounter.      No orders of the defined types were placed in this encounter.      SIGNED:    Dario Ave, PA          This note was generated by the Dragon speech recognition and may contain errors or omissions not intended by the user. Grammatical errors, random word insertions, deletions, pronoun errors, and incomplete sentences are occasional consequences of this technology due to software limitations. Not all errors are caught or corrected. If there are questions or concerns about the content of this note or information contained within the body of this dictation, they should be addressed directly with the author for clarification.

## 2021-09-01 ENCOUNTER — Encounter (INDEPENDENT_AMBULATORY_CARE_PROVIDER_SITE_OTHER): Payer: Self-pay | Admitting: Physician Assistant

## 2021-09-03 ENCOUNTER — Other Ambulatory Visit: Payer: Self-pay | Admitting: Critical Care Medicine

## 2021-09-03 DIAGNOSIS — R0602 Shortness of breath: Secondary | ICD-10-CM

## 2021-09-08 ENCOUNTER — Other Ambulatory Visit (INDEPENDENT_AMBULATORY_CARE_PROVIDER_SITE_OTHER): Payer: Self-pay | Admitting: Internal Medicine

## 2021-09-08 ENCOUNTER — Ambulatory Visit
Admission: RE | Admit: 2021-09-08 | Discharge: 2021-09-08 | Disposition: A | Discharge status: Home / Self Care | Payer: Medicare Other | Source: Ambulatory Visit | Attending: Critical Care Medicine | Admitting: Critical Care Medicine

## 2021-09-08 DIAGNOSIS — E039 Hypothyroidism, unspecified: Secondary | ICD-10-CM

## 2021-09-08 DIAGNOSIS — R0602 Shortness of breath: Secondary | ICD-10-CM | POA: Insufficient documentation

## 2021-09-12 ENCOUNTER — Other Ambulatory Visit: Payer: Self-pay | Admitting: Internal Medicine

## 2021-09-12 DIAGNOSIS — E291 Testicular hypofunction: Secondary | ICD-10-CM

## 2021-09-13 ENCOUNTER — Encounter (INDEPENDENT_AMBULATORY_CARE_PROVIDER_SITE_OTHER): Payer: Medicare Other | Admitting: Family

## 2021-09-16 ENCOUNTER — Ambulatory Visit (INDEPENDENT_AMBULATORY_CARE_PROVIDER_SITE_OTHER): Payer: Medicare Other | Admitting: Cardiology

## 2021-09-19 ENCOUNTER — Other Ambulatory Visit (INDEPENDENT_AMBULATORY_CARE_PROVIDER_SITE_OTHER): Payer: Self-pay | Admitting: Internal Medicine

## 2021-09-19 ENCOUNTER — Encounter (INDEPENDENT_AMBULATORY_CARE_PROVIDER_SITE_OTHER): Payer: Self-pay | Admitting: Internal Medicine

## 2021-09-20 ENCOUNTER — Other Ambulatory Visit (INDEPENDENT_AMBULATORY_CARE_PROVIDER_SITE_OTHER): Payer: Self-pay | Admitting: Internal Medicine

## 2021-09-20 MED ORDER — HYDROCORTISONE 5 MG PO TABS
ORAL_TABLET | ORAL | 1 refills | Status: DC
Start: 2021-09-20 — End: 2021-11-05

## 2021-09-20 NOTE — Telephone Encounter (Signed)
We can refill the hydrocortisone.  We cannot refill the testosterone since it is a controlled substance and he has not been seen in the past 6 months

## 2021-09-27 ENCOUNTER — Other Ambulatory Visit: Payer: Self-pay | Admitting: Internal Medicine

## 2021-09-27 ENCOUNTER — Encounter (INDEPENDENT_AMBULATORY_CARE_PROVIDER_SITE_OTHER): Payer: Self-pay | Admitting: Internal Medicine

## 2021-09-27 DIAGNOSIS — E782 Mixed hyperlipidemia: Secondary | ICD-10-CM

## 2021-09-27 MED ORDER — SIMVASTATIN 40 MG PO TABS
40.0000 mg | ORAL_TABLET | Freq: Every evening | ORAL | 3 refills | Status: AC
Start: 2021-09-27 — End: ?

## 2021-09-28 ENCOUNTER — Other Ambulatory Visit (INDEPENDENT_AMBULATORY_CARE_PROVIDER_SITE_OTHER): Payer: Self-pay | Admitting: Internal Medicine

## 2021-09-29 ENCOUNTER — Encounter: Payer: Self-pay | Admitting: Internal Medicine

## 2021-09-29 ENCOUNTER — Ambulatory Visit (FREE_STANDING_LABORATORY_FACILITY): Payer: Medicare Other

## 2021-09-29 DIAGNOSIS — Z0000 Encounter for general adult medical examination without abnormal findings: Secondary | ICD-10-CM

## 2021-09-29 DIAGNOSIS — R7301 Impaired fasting glucose: Secondary | ICD-10-CM

## 2021-09-29 DIAGNOSIS — N1831 Chronic kidney disease, stage 3a: Secondary | ICD-10-CM

## 2021-09-29 DIAGNOSIS — E039 Hypothyroidism, unspecified: Secondary | ICD-10-CM

## 2021-09-29 DIAGNOSIS — E782 Mixed hyperlipidemia: Secondary | ICD-10-CM

## 2021-09-29 DIAGNOSIS — R351 Nocturia: Secondary | ICD-10-CM

## 2021-09-29 DIAGNOSIS — I10 Essential (primary) hypertension: Secondary | ICD-10-CM

## 2021-09-29 LAB — CBC AND DIFFERENTIAL
Absolute NRBC: 0 10*3/uL (ref 0.00–0.00)
Basophils Absolute Automated: 0.08 10*3/uL (ref 0.00–0.08)
Basophils Automated: 0.8 %
Eosinophils Absolute Automated: 0.25 10*3/uL (ref 0.00–0.44)
Eosinophils Automated: 2.4 %
Hematocrit: 44.8 % (ref 37.6–49.6)
Hgb: 14.7 g/dL (ref 12.5–17.1)
Immature Granulocytes Absolute: 0.08 10*3/uL — ABNORMAL HIGH (ref 0.00–0.07)
Immature Granulocytes: 0.8 %
Instrument Absolute Neutrophil Count: 5.63 10*3/uL (ref 1.10–6.33)
Lymphocytes Absolute Automated: 3.35 10*3/uL — ABNORMAL HIGH (ref 0.42–3.22)
Lymphocytes Automated: 32.2 %
MCH: 30.1 pg (ref 25.1–33.5)
MCHC: 32.8 g/dL (ref 31.5–35.8)
MCV: 91.8 fL (ref 78.0–96.0)
MPV: 11.5 fL (ref 8.9–12.5)
Monocytes Absolute Automated: 1.01 10*3/uL — ABNORMAL HIGH (ref 0.21–0.85)
Monocytes: 9.7 %
Neutrophils Absolute: 5.63 10*3/uL (ref 1.10–6.33)
Neutrophils: 54.1 %
Nucleated RBC: 0 /100 WBC (ref 0.0–0.0)
Platelets: 245 10*3/uL (ref 142–346)
RBC: 4.88 10*6/uL (ref 4.20–5.90)
RDW: 13 % (ref 11–15)
WBC: 10.4 10*3/uL — ABNORMAL HIGH (ref 3.10–9.50)

## 2021-09-29 LAB — TSH: TSH: 0.63 u[IU]/mL (ref 0.35–4.94)

## 2021-09-29 LAB — URINALYSIS REFLEX TO MICROSCOPIC EXAM - REFLEX TO CULTURE
Bilirubin, UA: NEGATIVE
Blood, UA: NEGATIVE
Glucose, UA: NEGATIVE
Ketones UA: NEGATIVE
Leukocyte Esterase, UA: NEGATIVE
Nitrite, UA: NEGATIVE
Protein, UR: NEGATIVE
Specific Gravity UA: 1.013 (ref 1.001–1.035)
Urine pH: 7 (ref 5.0–8.0)
Urobilinogen, UA: NORMAL mg/dL

## 2021-09-29 LAB — LIPID PANEL
Cholesterol / HDL Ratio: 3.1 Index
Cholesterol: 183 mg/dL (ref 0–199)
HDL: 60 mg/dL (ref 40–9999)
LDL Calculated: 77 mg/dL (ref 0–99)
Triglycerides: 232 mg/dL — ABNORMAL HIGH (ref 34–149)
VLDL Calculated: 46 mg/dL — ABNORMAL HIGH (ref 10–40)

## 2021-09-29 LAB — HEPATIC FUNCTION PANEL
ALT: 90 U/L — ABNORMAL HIGH (ref 0–55)
AST (SGOT): 46 U/L — ABNORMAL HIGH (ref 5–41)
Albumin/Globulin Ratio: 1.6 (ref 0.9–2.2)
Albumin: 4.1 g/dL (ref 3.5–5.0)
Alkaline Phosphatase: 57 U/L (ref 37–117)
Bilirubin Direct: 0.4 mg/dL (ref 0.0–0.5)
Bilirubin Indirect: 0.7 mg/dL (ref 0.2–1.0)
Bilirubin, Total: 1.1 mg/dL (ref 0.2–1.2)
Globulin: 2.6 g/dL (ref 2.0–3.6)
Protein, Total: 6.7 g/dL (ref 6.0–8.3)

## 2021-09-29 LAB — BASIC METABOLIC PANEL
Anion Gap: 9 (ref 5.0–15.0)
BUN: 26 mg/dL (ref 9.0–28.0)
CO2: 26 mEq/L (ref 17–29)
Calcium: 9.3 mg/dL (ref 7.9–10.2)
Chloride: 105 mEq/L (ref 99–111)
Creatinine: 1.2 mg/dL (ref 0.5–1.5)
Glucose: 115 mg/dL — ABNORMAL HIGH (ref 70–100)
Potassium: 4.3 mEq/L (ref 3.5–5.3)
Sodium: 140 mEq/L (ref 135–145)

## 2021-09-29 LAB — HEMOGLOBIN A1C
Average Estimated Glucose: 125.5 mg/dL
Hemoglobin A1C: 6 % — ABNORMAL HIGH (ref 4.6–5.9)

## 2021-09-29 LAB — GFR: EGFR: 59.5

## 2021-09-29 LAB — T4, FREE: T4 Free: 1.16 ng/dL (ref 0.69–1.48)

## 2021-09-29 LAB — HEMOLYSIS INDEX: Hemolysis Index: 9 Index (ref 0–24)

## 2021-09-29 NOTE — Progress Notes (Signed)
Blood drawn from right AC using aseptic technique. Attempts x 1. Dressing applied. Client tolerated well, no complications noted.      The following was sent to [facility]:   2 Gold/Tiger Top Tube (spun)  1 Lavender Top Tube  1 Urinalysis Tube  1 Grey Top Tube

## 2021-10-05 ENCOUNTER — Telehealth: Payer: Self-pay | Admitting: Internal Medicine

## 2021-10-05 NOTE — Telephone Encounter (Signed)
Patient called in to follow up on a refill request sent via Mychart on 09/27/2021, patient made aware Dr. Ralene Ok needs for him to have his Testosterone levels checked per [facility] reply on 09/28/2021, before another refill is sent into the pharmacy. Patient had no further questions.

## 2021-10-06 ENCOUNTER — Other Ambulatory Visit: Payer: Self-pay | Admitting: Internal Medicine

## 2021-10-06 DIAGNOSIS — E291 Testicular hypofunction: Secondary | ICD-10-CM

## 2021-10-07 ENCOUNTER — Telehealth: Payer: Self-pay

## 2021-10-07 ENCOUNTER — Ambulatory Visit (FREE_STANDING_LABORATORY_FACILITY): Payer: Medicare Other

## 2021-10-07 DIAGNOSIS — E291 Testicular hypofunction: Secondary | ICD-10-CM

## 2021-10-07 NOTE — Progress Notes (Signed)
Blood drawn from right AC using aseptic technique. Attempts x 1. Dressing applied. Client tolerated well, no complications noted.      The following was sent to [facility]:   1 Red top for testosterone, free and total.

## 2021-10-07 NOTE — Telephone Encounter (Signed)
Pt requests his testosterone results from today's collection be routed to Dr. Ralene Ok, when resulted.

## 2021-10-12 LAB — TESTOSTERONE, FREE, TOTAL
Free Testosterone: 43.6 pg/mL (ref 30.0–135.0)
Total Testosterone: 318 ng/dL (ref 250–1100)

## 2021-10-13 ENCOUNTER — Telehealth: Payer: Self-pay

## 2021-10-13 NOTE — Telephone Encounter (Signed)
Testosterone results faxed to Dr. Ralene Ok [phone].

## 2021-10-13 NOTE — Telephone Encounter (Signed)
Faxed testosterone results to Dr. Ralene Ok

## 2021-10-14 ENCOUNTER — Other Ambulatory Visit: Payer: Self-pay

## 2021-10-14 DIAGNOSIS — E782 Mixed hyperlipidemia: Secondary | ICD-10-CM

## 2021-10-14 MED ORDER — EZETIMIBE 10 MG PO TABS
10.0000 mg | ORAL_TABLET | Freq: Every day | ORAL | 3 refills | Status: AC
Start: 2021-10-14 — End: ?

## 2021-10-18 NOTE — Telephone Encounter (Signed)
It was not the labs we needed, we need f/u every 6 months for a controlled substance.  Also, given his recurrent meningioma, we will need to discuss at his appointment the risks and benefits of continuing testosterone therapy

## 2021-10-20 ENCOUNTER — Other Ambulatory Visit (INDEPENDENT_AMBULATORY_CARE_PROVIDER_SITE_OTHER): Payer: Self-pay | Admitting: Internal Medicine

## 2021-10-20 ENCOUNTER — Ambulatory Visit (FREE_STANDING_LABORATORY_FACILITY): Payer: Medicare Other

## 2021-10-20 ENCOUNTER — Telehealth (INDEPENDENT_AMBULATORY_CARE_PROVIDER_SITE_OTHER): Payer: Self-pay | Admitting: Internal Medicine

## 2021-10-20 DIAGNOSIS — E291 Testicular hypofunction: Secondary | ICD-10-CM

## 2021-10-20 LAB — ESTRADIOL: Estradiol: <24 pg/mL (ref 11–44)

## 2021-10-20 MED ORDER — TESTOSTERONE 20.25 MG/ACT (1.62%) TD GEL
TRANSDERMAL | 1 refills | Status: AC
Start: 2021-10-20 — End: ?

## 2021-10-20 NOTE — Progress Notes (Signed)
Blood Draw:  Fasting? = Yes  Location of stick: Right AC using aseptic technique.  Outcome: Patient tolerated well without any complications. Attempt # 1.    Collected:   1 Gold top tube.  Processed: 1 gold top tube spun.  Lab/Temperature: Lab processed and sent to [facility] for testing.

## 2021-10-20 NOTE — Telephone Encounter (Signed)
Called patient to discuss his testosterone refill.      discussed risks and benefits of testosterone therapy.   Risks include, but are not limited to, prostate disease, heart disease, stroke, and fertility issues.  Patient understands these risks.       Also discussed the data behind meningiomas and women on HRT.   Discussed the theoretical risk of increasing E when on TRT.   Patient understands.   Wants to continue TRT for now  Will get E level

## 2021-10-26 ENCOUNTER — Encounter (INDEPENDENT_AMBULATORY_CARE_PROVIDER_SITE_OTHER): Payer: Self-pay | Admitting: Cardiology

## 2021-10-26 DIAGNOSIS — I10 Essential (primary) hypertension: Secondary | ICD-10-CM

## 2021-10-26 DIAGNOSIS — E782 Mixed hyperlipidemia: Secondary | ICD-10-CM

## 2021-10-26 MED ORDER — METOPROLOL SUCCINATE ER 100 MG PO TB24
100.0000 mg | ORAL_TABLET | Freq: Every day | ORAL | 3 refills | Status: AC
Start: 2021-10-26 — End: ?

## 2021-10-26 MED ORDER — METOPROLOL SUCCINATE ER 50 MG PO TB24
50.0000 mg | ORAL_TABLET | Freq: Every day | ORAL | 3 refills | Status: AC
Start: 2021-10-26 — End: ?

## 2021-11-03 ENCOUNTER — Ambulatory Visit: Payer: Medicare Other

## 2021-11-03 DIAGNOSIS — Z0110 Encounter for examination of ears and hearing without abnormal findings: Secondary | ICD-10-CM

## 2021-11-03 NOTE — Progress Notes (Signed)
Patient arrived in the office for hearing evaluation test.    [x] Hearing Test  There were hearing deficits detected in bilateral ears in multiple frequencies.  In the right ear, there was mild hearing loss in the 4000 and 6000 Hz frequencies and moderate hearing loss in the 8000 Hz frequency.  In the left ear, there was mild hearing loss in the 500, 2000, 3000, and 6000 Hz frequencies as well as moderately severe hearing loss in the 8000 Hz frequency.  Hearing protection was discussed.  The use of ear plugs or noise cancelling headphones were encouraged when exposed to loud noise or when traveling on an airplane.     Discussed results with patient.  He was interested in the use of a "Pocket Talker" when watching TV at home.  He will discuss with Dr. .

## 2021-11-05 ENCOUNTER — Encounter (INDEPENDENT_AMBULATORY_CARE_PROVIDER_SITE_OTHER): Payer: Self-pay | Admitting: Internal Medicine

## 2021-11-05 ENCOUNTER — Other Ambulatory Visit (INDEPENDENT_AMBULATORY_CARE_PROVIDER_SITE_OTHER): Payer: Self-pay | Admitting: Internal Medicine

## 2021-11-05 ENCOUNTER — Telehealth (INDEPENDENT_AMBULATORY_CARE_PROVIDER_SITE_OTHER): Payer: Medicare Other | Admitting: Internal Medicine

## 2021-11-05 DIAGNOSIS — E291 Testicular hypofunction: Secondary | ICD-10-CM

## 2021-11-05 DIAGNOSIS — E230 Hypopituitarism: Secondary | ICD-10-CM

## 2021-11-05 DIAGNOSIS — E039 Hypothyroidism, unspecified: Secondary | ICD-10-CM

## 2021-11-05 DIAGNOSIS — Z79890 Hormone replacement therapy (postmenopausal): Secondary | ICD-10-CM

## 2021-11-05 DIAGNOSIS — E2749 Other adrenocortical insufficiency: Secondary | ICD-10-CM

## 2021-11-05 MED ORDER — LEVOTHYROXINE SODIUM 150 MCG PO TABS
150.0000 ug | ORAL_TABLET | Freq: Every day | ORAL | 1 refills | Status: DC
Start: 2021-11-05 — End: 2021-12-06

## 2021-11-05 MED ORDER — HYDROCORTISONE 5 MG PO TABS
ORAL_TABLET | ORAL | 1 refills | Status: AC
Start: 2021-11-05 — End: ?

## 2021-11-05 NOTE — Progress Notes (Signed)
[facility]                       Date of Exam: 11/05/2021 10:53 AM        Patient ID: Shannon Neighboray Allen Rosenwald, MD is a 71 y.o. male.  Attending Physician: Deloria LairSamim Amauri Keefe, MD        Chief Complaint:    No chief complaint on file.              HPI:    HPI    71yo being seen for hypoPIT  Please see chart for full details  Previously seen by Dr. Margarette Asalusman    AI   [facility]   Has chronic fatigue, states it is the same since his surgery no matter what his dosage has been   Also with fibromyalgia     Central hypothyroidism   Currently on 150mcg LT4  Free hormones wnl  Did not tolerate cytomel    Hypogonadism  Now off T, did not feel benefit on  No change in sx's coming off       Per chart, previously on GH, but off for some time  No hx of DI                   Problem List:    Patient Active Problem List   Diagnosis    Chronic fatigue syndrome with fibromyalgia    Essential hypertension    Mixed hyperlipidemia    Primary hypothyroidism    Secondary male hypogonadism    History of asthma    BPH (benign prostatic hyperplasia)    History of meningioma of the brain    Chronic back pain / Narcotic Dependence    Growth hormone deficiency    Hemianopia, homonymous, left    History of insomnia    Amnestic MCI (mild cognitive impairment with memory loss)    Secondary adrenal insufficiency    Recurrent sinusitis    Spondylosis of cervical region without myelopathy or radiculopathy    Left testicular pain    Xerostomia    Basal cell carcinoma (BCC) of skin of nose    IFG (impaired fasting glucose)    Onychomycosis    Irritable bowel syndrome with constipation / Cyclic vomiting syndrome    Orthostatic hypotension    Stage 3a chronic kidney disease    Trapezius strain, right, initial encounter    Transaminitis and fatty liver on prior imaging    Trigger middle finger of right hand             Current Meds:    No outpatient medications have been marked as taking for the 11/05/21 encounter (Appointment)  with Deloria LairEnayat, Tzipporah Nagorski, MD.          Allergies:    Allergies   Allergen Reactions    Bextra [Valdecoxib] Rash             Past Surgical History:    Past Surgical History:   Procedure Laterality Date    ABLATION OF DYSRHYTHMIC FOCUS  meningioma    COLONOSCOPY  2008    ECHOCARDIOGRAM, TRANSTHORACIC  04/29/2005    63    ECHOCARDIOGRAM, TRANSTHORACIC  10/17/2016    63    EGD, COLONOSCOPY N/A 04/16/2019    Procedure: EGD, COLONOSCOPY;  Surgeon: Leatha Gildingan, Jeremias C, MD;  [facility];  Service: Gastroenterology;  Laterality: N/A;  egd, colonoscopy  q1-unk, md req 45 min    EXCISION, LESION  12/03/2008    benign lesion    ORCHIOPEXY Left 1980    for intermittent torsion    Subtotal resection of posterior sagittal meningioma  12/05/2002    Partial brain resection secondary to meningioma at [facility]    TONSILLECTOMY AND ADENOIDECTOMY  as a child    TOOTH EXTRACTION Right 06/2018           Family History:    Family History   Problem Relation Age of Onset    Tuberculosis Mother     Dementia Mother     Hearing loss Mother     Migraines Sister     Asthma Daughter         in [place]    No known problems Daughter         in [place]           Social History:    Social History     Tobacco Use    Smoking status: Never    Smokeless tobacco: Never   Vaping Use    Vaping status: Never Used   Substance Use Topics    Alcohol use: Not Currently     Alcohol/week: 1.0 standard drink of alcohol     Types: 1 Drinks containing 0.5 oz of alcohol per week     Comment: rarely    Drug use: Never     Types: Marijuana     Comment: in edible form           The following sections were reviewed this encounter by the provider:            Vital Signs:    There were no vitals taken for this visit.         ROS:    As per HPI           Physical Exam:    Physical Exam  Constitutional: NAD, conversant  ENT: normal inspection of nose, normal inspection of ears  Neck: symmetrical, no obvious thyromegaly, gross movement intact  Head: atraumatic,  normocephalic  Respiratory:  normal respiratory effort  Psychiatric: normal affect, normal mood  Neurologic: normal speech          Assessment:    There are no diagnoses linked to this encounter.            Plan:    Hypopit - see below    2. Hypothyroidism - continue current regimen   Free hormones WNL    3. AI - continue HC   Discussed stress dosing during times of stress or illness    4. Testosterone deficiency - stopped T  Did not feel benefit on                      Follow-up:    No follow-ups on file.         Deloria Lair, MD

## 2021-11-10 ENCOUNTER — Encounter (INDEPENDENT_AMBULATORY_CARE_PROVIDER_SITE_OTHER): Payer: Self-pay | Admitting: Cardiology

## 2021-11-17 ENCOUNTER — Ambulatory Visit (INDEPENDENT_AMBULATORY_CARE_PROVIDER_SITE_OTHER): Payer: Medicare Other | Admitting: Internal Medicine

## 2021-12-06 ENCOUNTER — Other Ambulatory Visit (INDEPENDENT_AMBULATORY_CARE_PROVIDER_SITE_OTHER): Payer: Self-pay | Admitting: Internal Medicine

## 2021-12-06 DIAGNOSIS — E039 Hypothyroidism, unspecified: Secondary | ICD-10-CM

## 2021-12-09 ENCOUNTER — Encounter: Payer: Self-pay | Admitting: Internal Medicine

## 2021-12-09 ENCOUNTER — Telehealth: Payer: Self-pay | Admitting: Internal Medicine

## 2021-12-09 NOTE — Telephone Encounter (Signed)
Called patient to schedule him for his annual exam.  He explained he recently moved to [place] and will no longer be coming to see Dr. Orlin Hilding.  He would like to end his membership with [employer].    He did want Dr. Orlin Hilding to know his brain tumor returned.  He received proton therapy at [facility] before he moved to [place].

## 2022-01-13 ENCOUNTER — Other Ambulatory Visit (INDEPENDENT_AMBULATORY_CARE_PROVIDER_SITE_OTHER): Payer: Self-pay | Admitting: Cardiology

## 2022-01-13 DIAGNOSIS — I10 Essential (primary) hypertension: Secondary | ICD-10-CM

## 2022-01-14 ENCOUNTER — Other Ambulatory Visit: Payer: Self-pay

## 2022-01-14 DIAGNOSIS — R350 Frequency of micturition: Secondary | ICD-10-CM

## 2022-01-14 MED ORDER — FINASTERIDE 5 MG PO TABS
5.0000 mg | ORAL_TABLET | Freq: Every evening | ORAL | 0 refills | Status: AC
Start: 2022-01-14 — End: ?

## 2022-02-25 ENCOUNTER — Ambulatory Visit (INDEPENDENT_AMBULATORY_CARE_PROVIDER_SITE_OTHER): Payer: Medicare Other | Admitting: Cardiology

## 2022-02-28 ENCOUNTER — Telehealth: Payer: Self-pay

## 2022-02-28 NOTE — Telephone Encounter (Signed)
Received faxed refill request from [id number], [address]. [place], [place]  [address].  Finasteride, 5 mg tablet. Take 1 tablet by mouth everyday at bedtime. .Dispense 90 with 0 refills.      This was a one time courtesy refill as patient is no longer with our practice and has moved to [place].

## 2022-03-03 ENCOUNTER — Other Ambulatory Visit (INDEPENDENT_AMBULATORY_CARE_PROVIDER_SITE_OTHER): Payer: Self-pay | Admitting: Nurse Practitioner

## 2022-03-03 DIAGNOSIS — I10 Essential (primary) hypertension: Secondary | ICD-10-CM

## 2022-03-03 DIAGNOSIS — E782 Mixed hyperlipidemia: Secondary | ICD-10-CM
